# Patient Record
Sex: Female | Born: 1935 | Race: White | Hispanic: No | Smoking: Never smoker
Health system: Southern US, Community
[De-identification: ages and names within clinical notes are randomized; demographics above are authoritative.]

## PROBLEM LIST (undated history)

## (undated) DIAGNOSIS — D122 Benign neoplasm of ascending colon: Secondary | ICD-10-CM

## (undated) DIAGNOSIS — H269 Unspecified cataract: Secondary | ICD-10-CM

## (undated) DIAGNOSIS — I10 Essential (primary) hypertension: Secondary | ICD-10-CM

## (undated) DIAGNOSIS — C9001 Multiple myeloma in remission: Secondary | ICD-10-CM

## (undated) DIAGNOSIS — E785 Hyperlipidemia, unspecified: Secondary | ICD-10-CM

## (undated) DIAGNOSIS — C4490 Unspecified malignant neoplasm of skin, unspecified: Secondary | ICD-10-CM

## (undated) DIAGNOSIS — L570 Actinic keratosis: Secondary | ICD-10-CM

## (undated) DIAGNOSIS — B029 Zoster without complications: Secondary | ICD-10-CM

## (undated) DIAGNOSIS — K219 Gastro-esophageal reflux disease without esophagitis: Secondary | ICD-10-CM

## (undated) DIAGNOSIS — E039 Hypothyroidism, unspecified: Secondary | ICD-10-CM

## (undated) DIAGNOSIS — M1990 Unspecified osteoarthritis, unspecified site: Secondary | ICD-10-CM

## (undated) DIAGNOSIS — C9000 Multiple myeloma not having achieved remission: Secondary | ICD-10-CM

## (undated) DIAGNOSIS — M810 Age-related osteoporosis without current pathological fracture: Secondary | ICD-10-CM

## (undated) HISTORY — DX: Multiple myeloma not having achieved remission: C90.00

## (undated) HISTORY — DX: Actinic keratosis: L57.0

## (undated) HISTORY — DX: Benign neoplasm of ascending colon: D12.2

## (undated) HISTORY — DX: Multiple myeloma in remission: C90.01

## (undated) HISTORY — DX: Unspecified osteoarthritis, unspecified site: M19.90

## (undated) HISTORY — DX: Unspecified cataract: H26.9

## (undated) HISTORY — DX: Gastro-esophageal reflux disease without esophagitis: K21.9

## (undated) HISTORY — PX: BREAST EXCISIONAL BIOPSY: SUR124

## (undated) HISTORY — DX: Hyperlipidemia, unspecified: E78.5

## (undated) HISTORY — DX: Essential (primary) hypertension: I10

## (undated) HISTORY — DX: Zoster without complications: B02.9

## (undated) HISTORY — DX: Hypothyroidism, unspecified: E03.9

## (undated) HISTORY — PX: CATARACT EXTRACTION W/ INTRAOCULAR LENS IMPLANT: SHX1309

## (undated) HISTORY — DX: Unspecified malignant neoplasm of skin, unspecified: C44.90

## (undated) HISTORY — DX: Age-related osteoporosis without current pathological fracture: M81.0

---

## 1978-06-11 HISTORY — PX: LAPAROSCOPIC HYSTERECTOMY: SHX1926

## 2004-07-06 ENCOUNTER — Ambulatory Visit: Payer: Self-pay | Admitting: Internal Medicine

## 2005-04-19 ENCOUNTER — Emergency Department: Payer: Self-pay | Admitting: Emergency Medicine

## 2005-04-19 ENCOUNTER — Other Ambulatory Visit: Payer: Self-pay

## 2005-07-25 ENCOUNTER — Ambulatory Visit: Payer: Self-pay | Admitting: Internal Medicine

## 2006-07-26 ENCOUNTER — Ambulatory Visit: Payer: Self-pay | Admitting: Internal Medicine

## 2007-08-13 ENCOUNTER — Ambulatory Visit: Payer: Self-pay | Admitting: Internal Medicine

## 2008-04-16 ENCOUNTER — Encounter: Payer: Self-pay | Admitting: Internal Medicine

## 2008-08-24 ENCOUNTER — Ambulatory Visit: Payer: Self-pay | Admitting: Gastroenterology

## 2008-09-03 ENCOUNTER — Ambulatory Visit: Payer: Self-pay | Admitting: Internal Medicine

## 2009-09-21 ENCOUNTER — Ambulatory Visit: Payer: Self-pay | Admitting: Internal Medicine

## 2010-06-01 ENCOUNTER — Ambulatory Visit: Payer: Self-pay

## 2010-10-04 ENCOUNTER — Ambulatory Visit: Payer: Self-pay | Admitting: Internal Medicine

## 2011-03-05 ENCOUNTER — Ambulatory Visit: Payer: Self-pay | Admitting: Orthopedic Surgery

## 2011-10-10 ENCOUNTER — Ambulatory Visit: Payer: Self-pay | Admitting: Internal Medicine

## 2012-10-29 ENCOUNTER — Ambulatory Visit: Payer: Self-pay | Admitting: Internal Medicine

## 2013-11-11 ENCOUNTER — Ambulatory Visit: Payer: Self-pay | Admitting: Internal Medicine

## 2014-02-04 DIAGNOSIS — C4492 Squamous cell carcinoma of skin, unspecified: Secondary | ICD-10-CM

## 2014-02-04 HISTORY — DX: Squamous cell carcinoma of skin, unspecified: C44.92

## 2014-03-10 ENCOUNTER — Ambulatory Visit: Payer: Self-pay | Admitting: Internal Medicine

## 2014-12-14 ENCOUNTER — Other Ambulatory Visit: Payer: Self-pay | Admitting: Nurse Practitioner

## 2014-12-14 DIAGNOSIS — R319 Hematuria, unspecified: Secondary | ICD-10-CM

## 2014-12-16 ENCOUNTER — Ambulatory Visit
Admission: RE | Admit: 2014-12-16 | Discharge: 2014-12-16 | Disposition: A | Discharge status: Home / Self Care | Payer: Medicare Other | Source: Ambulatory Visit | Attending: Nurse Practitioner | Admitting: Nurse Practitioner

## 2014-12-16 DIAGNOSIS — R948 Abnormal results of function studies of other organs and systems: Secondary | ICD-10-CM | POA: Insufficient documentation

## 2014-12-16 DIAGNOSIS — M479 Spondylosis, unspecified: Secondary | ICD-10-CM | POA: Insufficient documentation

## 2014-12-16 DIAGNOSIS — M4806 Spinal stenosis, lumbar region: Secondary | ICD-10-CM | POA: Diagnosis not present

## 2014-12-16 DIAGNOSIS — R319 Hematuria, unspecified: Secondary | ICD-10-CM

## 2014-12-16 DIAGNOSIS — I700 Atherosclerosis of aorta: Secondary | ICD-10-CM | POA: Insufficient documentation

## 2014-12-16 DIAGNOSIS — I2510 Atherosclerotic heart disease of native coronary artery without angina pectoris: Secondary | ICD-10-CM | POA: Insufficient documentation

## 2014-12-16 DIAGNOSIS — R932 Abnormal findings on diagnostic imaging of liver and biliary tract: Secondary | ICD-10-CM | POA: Insufficient documentation

## 2014-12-16 DIAGNOSIS — M545 Low back pain: Secondary | ICD-10-CM | POA: Insufficient documentation

## 2014-12-16 DIAGNOSIS — M4856XA Collapsed vertebra, not elsewhere classified, lumbar region, initial encounter for fracture: Secondary | ICD-10-CM | POA: Diagnosis not present

## 2014-12-16 DIAGNOSIS — M5136 Other intervertebral disc degeneration, lumbar region: Secondary | ICD-10-CM | POA: Diagnosis not present

## 2014-12-16 DIAGNOSIS — R312 Other microscopic hematuria: Secondary | ICD-10-CM | POA: Diagnosis not present

## 2014-12-16 MED ORDER — IOHEXOL 300 MG/ML  SOLN
125.0000 mL | Freq: Once | INTRAMUSCULAR | Status: AC | PRN
  Administered 2014-12-16: 125 mL via INTRAVENOUS

## 2014-12-28 ENCOUNTER — Other Ambulatory Visit: Payer: Self-pay | Admitting: Internal Medicine

## 2014-12-28 DIAGNOSIS — R079 Chest pain, unspecified: Secondary | ICD-10-CM

## 2014-12-28 DIAGNOSIS — R0602 Shortness of breath: Secondary | ICD-10-CM

## 2014-12-31 ENCOUNTER — Ambulatory Visit
Admission: RE | Admit: 2014-12-31 | Discharge: 2014-12-31 | Disposition: A | Discharge status: Home / Self Care | Payer: Medicare Other | Source: Ambulatory Visit | Attending: Internal Medicine | Admitting: Internal Medicine

## 2014-12-31 DIAGNOSIS — R0602 Shortness of breath: Secondary | ICD-10-CM | POA: Insufficient documentation

## 2014-12-31 DIAGNOSIS — R079 Chest pain, unspecified: Secondary | ICD-10-CM

## 2014-12-31 MED ORDER — TECHNETIUM TC 99M SESTAMIBI - CARDIOLITE
10.0000 | Freq: Once | INTRAVENOUS | Status: AC | PRN
  Administered 2014-12-31: 10:00:00 13.79 via INTRAVENOUS

## 2014-12-31 MED ORDER — REGADENOSON 0.4 MG/5ML IV SOLN
0.4000 mg | Freq: Once | INTRAVENOUS | Status: AC
  Administered 2014-12-31: 0.4 mg via INTRAVENOUS

## 2014-12-31 MED ORDER — TECHNETIUM TC 99M SESTAMIBI - CARDIOLITE
30.0000 | Freq: Once | INTRAVENOUS | Status: AC | PRN
  Administered 2014-12-31: 32.59 via INTRAVENOUS

## 2015-01-04 ENCOUNTER — Encounter: Payer: Self-pay | Admitting: Urology

## 2015-01-04 ENCOUNTER — Ambulatory Visit (INDEPENDENT_AMBULATORY_CARE_PROVIDER_SITE_OTHER): Payer: Medicare Other | Admitting: Urology

## 2015-01-04 VITALS — BP 133/67 | HR 97 | Ht 62.0 in | Wt 131.5 lb

## 2015-01-04 DIAGNOSIS — R312 Other microscopic hematuria: Secondary | ICD-10-CM

## 2015-01-04 DIAGNOSIS — R300 Dysuria: Secondary | ICD-10-CM | POA: Diagnosis not present

## 2015-01-04 DIAGNOSIS — R3129 Other microscopic hematuria: Secondary | ICD-10-CM

## 2015-01-04 DIAGNOSIS — R350 Frequency of micturition: Secondary | ICD-10-CM | POA: Diagnosis not present

## 2015-01-04 LAB — URINALYSIS, COMPLETE
BILIRUBIN UA: NEGATIVE
GLUCOSE, UA: NEGATIVE
Ketones, UA: NEGATIVE
LEUKOCYTES UA: NEGATIVE
Nitrite, UA: NEGATIVE
PH UA: 5.5 (ref 5.0–7.5)
Specific Gravity, UA: 1.025 (ref 1.005–1.030)
Urobilinogen, Ur: 0.2 mg/dL (ref 0.2–1.0)

## 2015-01-04 LAB — MICROSCOPIC EXAMINATION: BACTERIA UA: NONE SEEN

## 2015-01-04 LAB — BLADDER SCAN AMB NON-IMAGING

## 2015-01-04 NOTE — Progress Notes (Signed)
01/04/2015 4:41 PM   Rhonda [name] September 30, 1933 [id number]  Referring provider: No referring provider defined for this encounter.  Chief Complaint  Patient presents with  . Hematuria    New Patient    HPI: 81 year old female referred for evaluation of microscopic hematuria.  Microscopic blood was identified and several independent UAs in the absence of infection. She initially started having UAs tested after being treated for a "severe" UTI in October and May 2016 per patient report.  Despite treatment for infections, continues to have persistent microscopic hematuria, dysuria, and urinary frequency.  She had associated nausea and chills at times.  She also has occasional suprapubic [name] and discomfort.  She reports that she has not been felling well for months.   Never smoker.  She denies a history of flank [name] or gross hematuria. No history of kidney stones.    She underwent CT urogram as part of the workup for her microscopic hematuria which showed no evidence of significant GU pathology. She was noted to have incidental rectal wall thickening and has an appointment with GI, Dr. Allen Norris, for further evaluation.    PMH: Past Medical History  Diagnosis Date  . GERD (gastroesophageal reflux disease)   . Arthritis   . Hyperlipemia   . Hypertension     Surgical History: Past Surgical History  Procedure Laterality Date  . Laparoscopic hysterectomy  1980    Home Medications:    Medication List       This list is accurate as of: 01/04/15 11:59 PM.  Always use your most recent med list.               bisoprolol-hydrochlorothiazide 2.5-6.25 MG per tablet  Commonly known as:  ZIAC     ibandronate 150 MG tablet  Commonly known as:  BONIVA  Take 150 mg by mouth every 30 (thirty) days. Take in the morning with a full glass of water, on an empty stomach, and do not take anything else by mouth or lie down for the next 30 min.     levothyroxine 50 MCG tablet  Commonly  known as:  SYNTHROID, LEVOTHROID     PYRIDIUM PO  Take by mouth.     RESTASIS 0.05 % ophthalmic emulsion  Generic drug:  cycloSPORINE     VYTORIN 10-40 MG per tablet  Generic drug:  ezetimibe-simvastatin        Allergies: No Known Allergies  Family History: Family History  Problem Relation Age of Onset  . Kidney cancer Neg Hx   . Prostate cancer Neg Hx   . Bladder Cancer Neg Hx     Social History:  reports that she has never smoked. She does not have any smokeless tobacco history on file. She reports that she does not drink alcohol or use illicit drugs.  ROS: UROLOGY Frequent Urination?: Yes Hard to postpone urination?: No Burning/[name] with urination?: Yes Get up at night to urinate?: No Leakage of urine?: Yes Urine stream starts and stops?: No Trouble starting stream?: Yes Do you have to strain to urinate?: Yes Blood in urine?: Yes Urinary tract infection?: Yes Sexually transmitted disease?: No Injury to kidneys or bladder?: No Painful intercourse?: No Weak stream?: No Currently pregnant?: No Vaginal bleeding?: No Last menstrual period?: hysterectomy  Gastrointestinal Nausea?: Yes Vomiting?: No Indigestion/heartburn?: Yes Diarrhea?: No Constipation?: No  Constitutional Fever: No Night sweats?: Yes Weight loss?: Yes Fatigue?: Yes  Skin Skin rash/lesions?: No Itching?: No  Eyes Blurred vision?: No Double vision?: No  Ears/Nose/Throat Sore throat?: No Sinus problems?: Yes  Hematologic/Lymphatic Swollen glands?: No Easy bruising?: Yes  Cardiovascular Leg swelling?: Yes Chest [name]?: No  Respiratory Cough?: Yes Shortness of breath?: Yes  Endocrine Excessive thirst?: Yes  Musculoskeletal Back [name]?: Yes Joint [name]?: Yes  Neurological Headaches?: No Dizziness?: No  Psychologic Depression?: No Anxiety?: No  Physical Exam: BP 133/67 mmHg  Pulse 97  Ht 5\' 2"  (1.575 m)  Wt 131 lb 8 oz (59.648 kg)  BMI 24.05 kg/m2    Constitutional:  Alert and oriented, No acute distress. HEENT: [place] AT, moist mucus membranes.  Trachea midline, no masses. Cardiovascular: No clubbing, cyanosis, or edema. Respiratory: Normal respiratory effort, no increased work of breathing. GI: Abdomen is soft, nontender, nondistended, no abdominal masses GU: No CVA tenderness.  Skin: No rashes, bruises or suspicious lesions. Lymph: No cervical or inguinal adenopathy. Neurologic: Grossly intact, no focal deficits, moving all 4 extremities. Psychiatric: Normal mood and affect.  Laboratory Data:   Urinalysis Results for orders placed or performed in visit on 01/04/15  Microscopic Examination  Result Value Ref Range   WBC, UA 0-5 0 -  5 /hpf   RBC, UA 0-2 0 -  2 /hpf   Epithelial Cells (non renal) 0-10 0 - 10 /hpf   Bacteria, UA None seen None seen/Few  Urinalysis, Complete  Result Value Ref Range   Specific Gravity, UA 1.025 1.005 - 1.030   pH, UA 5.5 5.0 - 7.5   Color, UA Yellow Yellow   Appearance Ur Clear Clear   Leukocytes, UA Negative Negative   Protein, UA 2+ (A) Negative/Trace   Glucose, UA Negative Negative   Ketones, UA Negative Negative   RBC, UA 2+ (A) Negative   Bilirubin, UA Negative Negative   Urobilinogen, Ur 0.2 0.2 - 1.0 mg/dL   Nitrite, UA Negative Negative   Microscopic Examination See below:   BLADDER SCAN AMB NON-IMAGING  Result Value Ref Range   Scan Result 34ml     Pertinent Imaging:    Study Result     CLINICAL DATA: Nausea and illness in May 2016 with dehydration. Microscopic hematuria at the time. Pelvic and low back [name] and soreness.  EXAM: CT ABDOMEN AND PELVIS WITHOUT AND WITH CONTRAST  TECHNIQUE: Multidetector CT imaging of the abdomen and pelvis was performed following the standard protocol before and following the bolus administration of intravenous contrast.  CONTRAST: 133mL OMNIPAQUE IOHEXOL 300 MG/ML SOLN  COMPARISON: 03/10/2014  FINDINGS: Lower chest:  Right coronary artery atherosclerotic calcification.  Hepatobiliary: Scattered hypodense lesions in the liver, stable from 03/10/2014, likely cysts. Mildly contracted gallbladder.  Pancreas: Unremarkable  Spleen: Unremarkable  Adrenals/Urinary Tract: Hypodense right mid kidney lesion 5 mm, likely a cyst. Similar left kidney lower pole lesion, image 38 series 12, 5 mm diameter, likely a cyst. No significant abnormal parenchymal enhancement in the kidneys. No stones identified. No abnormal urothelial enhancement observed. On delayed phase images, no filling defect along the urothelium is seen, with good opacification of the ureters and collecting systems.  Stomach/Bowel: Mild lower rectal wall thickening.  Vascular/Lymphatic: Aortoiliac atherosclerotic vascular disease.  Reproductive: Uterus absent. Ovaries not well seen.  Other: Stable chronic mild mesenteric stranding slightly eccentric to the left, nonspecific. There is some scattered small lymph nodes in this vicinity but no pathologically enlarged adenopathy. No appreciable change in this over the past year.  Musculoskeletal: Superior endplate compression fracture at L1, not changed from prior. Prominent degenerative endplate disease and mild grade 1 anterolisthesis at L4-5, similar  to prior, with mild to moderate right foraminal stenosis at L4-5 resulting.  IMPRESSION: 1. No cause for hematuria identified. 2. Mild lower rectal wall thickening, nonspecific. Although most likely from peristaltic activity, inflammation or neoplasia is not specifically excluded. 3. Stable hepatic hypodense lesions, likely cysts. 4. Other imaging findings of potential clinical significance: Right coronary artery atherosclerosis; several tiny renal cysts; Aortoiliac atherosclerotic vascular disease. ; chronic nonspecific mild left eccentric mesenteric stranding ; chronic superior endplate compression at L1; mild to moderate right  foraminal stenosis at L4-5 due to spondylosis and degenerative disc disease.   Electronically Signed  By: Van Clines M.D.  On: 12/16/2014 17:06      Assessment & Plan:  81 year old female with microscopic hematuria, and other complaints including dysuria, lower abdominal [name], and urinary frequency despite the absence of infection. CT urogram was negative. Per AUA guidelines, recommend cystoscopy to complete micro-heme workup. Plan to perform pelvic exam at the same time to evaluate possible atrophic vaginitis contributing to dysuria and symptoms.  1. Microscopic hematuria Plan for cystoscopy - Urinalysis, Complete  2. Urinary frequency No evidence of incomplete bladder emptying today. Discussed behavioral modification. - BLADDER SCAN AMB NON-IMAGING  3. Dysuria Will perform pelvic exam at the time of cystoscopy.   Return in about 2 weeks (around 01/18/2015) for cystoscopy.  Hollice Espy, MD  [facility] [address] [place],  [address] [phone]

## 2015-01-04 NOTE — Patient Instructions (Signed)
Cystoscopy       Cystoscopy is a procedure that is used to help your caregiver diagnose and sometimes treat conditions that affect your lower urinary tract. Your lower urinary tract includes your bladder and the tube through which urine passes from your bladder out of your body (urethra). Cystoscopy is performed with a thin, tube-shaped instrument (cystoscope). The cystoscope has lenses and a light at the end so that your caregiver can see inside your bladder. The cystoscope is inserted at the entrance of your urethra. Your caregiver guides it through your urethra and into your bladder. There are two main types of cystoscopy:   Flexible cystoscopy (with a flexible cystoscope).   Rigid cystoscopy (with a rigid cystoscope).  Cystoscopy may be recommended for many conditions, including:   Urinary tract infections.   Blood in your urine (hematuria).   Loss of bladder control (urinary incontinence) or overactive bladder.   Unusual cells found in a urine sample.   Urinary blockage.   Painful urination.  Cystoscopy may also be done to remove a sample of your tissue to be checked under a microscope (biopsy). It may also be done to remove or destroy bladder stones.   LET YOUR CAREGIVER KNOW ABOUT:   Allergies to food or medicine.   Medicines taken, including vitamins, herbs, eyedrops, over-the-counter medicines, and creams.   Use of steroids (by mouth or creams).   Previous problems with anesthetics or numbing medicines.   History of bleeding problems or blood clots.   Previous surgery.   Other health problems, including diabetes and kidney problems.   Possibility of pregnancy, if this applies.  PROCEDURE   The area around the opening to your urethra will be cleaned. A medicine to numb your urethra (local anesthetic) is used. If a tissue sample or stone is removed during the procedure, you may be given a medicine to make you sleep (general anesthetic).   Your caregiver will gently insert the tip of the cystoscope into your  urethra. The cystoscope will be slowly glided through your urethra and into your bladder. Sterile fluid will flow through the cystoscope and into your bladder. The fluid will expand and stretch your bladder. This gives your caregiver a better view of your bladder walls. The procedure lasts about 15-20 minutes.   AFTER THE PROCEDURE   If a local anesthetic is used, you will be allowed to go home as soon as you are ready. If a general anesthetic is used, you will be taken to a recovery area until you are stable. You may have temporary bleeding and burning on urination.   Document Released: 05/25/2000 Document Revised: 02/20/2012 Document Reviewed: 11/19/2011   Exit[employer]® Patient Information ©2015 [employer]. This information is not intended to replace advice given to you by your health care provider. Make sure you discuss any questions you have with your health care provider.

## 2015-01-31 ENCOUNTER — Encounter: Payer: Self-pay | Admitting: Urology

## 2015-01-31 ENCOUNTER — Ambulatory Visit (INDEPENDENT_AMBULATORY_CARE_PROVIDER_SITE_OTHER): Payer: Medicare Other | Admitting: Urology

## 2015-01-31 VITALS — BP 149/81 | HR 77 | Ht 62.0 in | Wt 129.9 lb

## 2015-01-31 DIAGNOSIS — M810 Age-related osteoporosis without current pathological fracture: Secondary | ICD-10-CM | POA: Insufficient documentation

## 2015-01-31 DIAGNOSIS — R312 Other microscopic hematuria: Secondary | ICD-10-CM

## 2015-01-31 DIAGNOSIS — E785 Hyperlipidemia, unspecified: Secondary | ICD-10-CM | POA: Insufficient documentation

## 2015-01-31 DIAGNOSIS — K219 Gastro-esophageal reflux disease without esophagitis: Secondary | ICD-10-CM | POA: Insufficient documentation

## 2015-01-31 DIAGNOSIS — I10 Essential (primary) hypertension: Secondary | ICD-10-CM | POA: Insufficient documentation

## 2015-01-31 DIAGNOSIS — M1990 Unspecified osteoarthritis, unspecified site: Secondary | ICD-10-CM | POA: Insufficient documentation

## 2015-01-31 DIAGNOSIS — N952 Postmenopausal atrophic vaginitis: Secondary | ICD-10-CM | POA: Diagnosis not present

## 2015-01-31 DIAGNOSIS — E039 Hypothyroidism, unspecified: Secondary | ICD-10-CM | POA: Insufficient documentation

## 2015-01-31 DIAGNOSIS — R3129 Other microscopic hematuria: Secondary | ICD-10-CM

## 2015-01-31 HISTORY — DX: Essential (primary) hypertension: I10

## 2015-01-31 LAB — URINALYSIS, COMPLETE
BILIRUBIN UA: NEGATIVE
GLUCOSE, UA: NEGATIVE
KETONES UA: NEGATIVE
Leukocytes, UA: NEGATIVE
NITRITE UA: NEGATIVE
SPEC GRAV UA: 1.025 (ref 1.005–1.030)
UUROB: 0.2 mg/dL (ref 0.2–1.0)
pH, UA: 7 (ref 5.0–7.5)

## 2015-01-31 LAB — MICROSCOPIC EXAMINATION: Bacteria, UA: NONE SEEN

## 2015-01-31 MED ORDER — CIPROFLOXACIN HCL 500 MG PO TABS
500.0000 mg | ORAL_TABLET | Freq: Once | ORAL | Status: AC
  Administered 2015-01-31: 500 mg via ORAL

## 2015-01-31 MED ORDER — LIDOCAINE HCL 2 % EX GEL
1.0000 "application " | Freq: Once | CUTANEOUS | Status: AC
  Administered 2015-01-31: 1 via URETHRAL

## 2015-01-31 NOTE — Progress Notes (Signed)
Follow-up for microscopic hematuria and dysuria. Despite treatment for infections, continues to have persistent microscopic hematuria, dysuria, and urinary frequency. She had associated nausea and chills at times. She also has occasional suprapubic pain and discomfort. She had a normal post void residual. No smoking risk. No flank pain or gross hematuria. Of note, patient has been on Premarin cream in the past with her PCP.  Today, she follows up for the above. She underwentn CT urogram as part of the workup for her microscopic hematuria which showed no evidence of significant GU pathology. She was noted to have incidental rectal wall thickening and has an appointment with GI, Dr. Allen Norris, for further evaluation. I reviewed all the images today.  Physical exam: Chaperone-Kelita Abdomen was soft and nontender without mass GU: Significant vaginal atrophy and erythema of the vulva but otherwise no mass or lesion. The bladder and urethra were palpably normal.  Cystoscopy: Flexible cystoscope was passed per urethra. The urethra appeared normal. The trigone and ureteral orifices were in their normal orthotopic position with clear efflux. The bladder mucosa was unremarkable. There were no tumors. There were no stones or foreign bodies in the bladder. There was no trabeculation.   A/P -  Microscopic hematuria-benign evaluation Atrophic vaginitis-we discussed the nature risks benefits and alternatives to topical transvaginal estrogen with overall lower systemic absorption but did discuss risk of breast, uterine cancer and DVT. She'll consider and discuss again with Dr. Humphrey Rolls.

## 2015-02-01 ENCOUNTER — Other Ambulatory Visit: Payer: Self-pay

## 2015-02-01 ENCOUNTER — Ambulatory Visit (INDEPENDENT_AMBULATORY_CARE_PROVIDER_SITE_OTHER): Payer: Medicare Other | Admitting: Gastroenterology

## 2015-02-01 ENCOUNTER — Encounter: Payer: Self-pay | Admitting: Gastroenterology

## 2015-02-01 VITALS — BP 122/68 | HR 90 | Temp 98.6°F | Ht 62.0 in | Wt 129.8 lb

## 2015-02-01 DIAGNOSIS — R1032 Left lower quadrant pain: Secondary | ICD-10-CM | POA: Diagnosis not present

## 2015-02-01 DIAGNOSIS — R933 Abnormal findings on diagnostic imaging of other parts of digestive tract: Secondary | ICD-10-CM | POA: Diagnosis not present

## 2015-02-01 NOTE — Progress Notes (Signed)
Gastroenterology Consultation  Referring Provider:     Lavera Guise, MD Primary Care Physician:  Lavera Guise, MD Primary Gastroenterologist:  Dr. Allen Norris     Reason for Consultation:     Abnormal CT scan        HPI:   Rhonda [name] is a 81 y.o. y/o female referred for consultation & management of abnormal CT scan by Dr. Humphrey Rolls, Timoteo Gaul, MD.  This patient comes today with a history of hematuria and she was sent for CT scan that showed her to have thickening of the rectum which was reported as mild. The patient's workup for her kidney disease and bladder was negative. She reports that I did her colonoscopy approximate 6-1/2 years ago. There is no report of any unexplained weight loss. The patient also reports that she has had no change in bowel habits. She has been having some left lower quadrant pain and there showed some mesenteric fat stranding on the left on her CT scan. The pain is reported to be a spasm-like pain that will double her over in pain and then resolve. There is no report of any black stools or bloody stools. She also denies any nausea or vomiting associated with this pain.  Past Medical History  Diagnosis Date  . GERD (gastroesophageal reflux disease)   . Arthritis   . Hyperlipemia   . Hypertension   . Osteoporosis   . Hypothyroidism     Past Surgical History  Procedure Laterality Date  . Laparoscopic hysterectomy  1980    Prior to Admission medications   Medication Sig Start Date End Date Taking? Authorizing Provider  EPIPEN 2-PAK 0.3 MG/0.3ML SOAJ injection  01/25/15  Yes Historical Provider, MD  ibandronate (BONIVA) 150 MG tablet Take 150 mg by mouth every 30 (thirty) days. Take in the morning with a full glass of water, on an empty stomach, and do not take anything else by mouth or lie down for the next 30 min.   Yes Historical Provider, MD  levothyroxine (SYNTHROID, LEVOTHROID) 50 MCG tablet  12/24/14  Yes Historical Provider, MD  RESTASIS 0.05 % ophthalmic  emulsion  10/04/14  Yes Historical Provider, MD  VYTORIN 10-40 MG per tablet  12/28/14  Yes Historical Provider, MD  bisoprolol-hydrochlorothiazide [facility]) 2.5-6.25 MG per tablet  12/28/14   Historical Provider, MD  Phenazopyridine HCl (PYRIDIUM PO) Take by mouth.    Historical Provider, MD    Family History  Problem Relation Age of Onset  . Kidney cancer Neg Hx   . Prostate cancer Neg Hx   . Bladder Cancer Neg Hx   . Heart disease Mother   . Stroke Father   . Heart disease Brother      Social History  Substance Use Topics  . Smoking status: Never Smoker   . Smokeless tobacco: None  . Alcohol Use: No    Allergies as of 02/01/2015  . (No Known Allergies)    Review of Systems:    All systems reviewed and negative except where noted in HPI.   Physical Exam:  BP 122/68 mmHg  Pulse 90  Temp(Src) 98.6 F (37 C) (Oral)  Ht 5\' 2"  (1.575 m)  Wt 129 lb 12.8 oz (58.877 kg)  BMI 23.73 kg/m2 No LMP recorded. Patient has had a hysterectomy. Psych:  Alert and cooperative. Normal mood and affect. General:   Alert,  Well-developed, well-nourished, pleasant and cooperative in NAD Head:  Normocephalic and atraumatic. Eyes:  Sclera clear, no icterus.  Conjunctiva pink. Ears:  Normal auditory acuity. Nose:  No deformity, discharge, or lesions. Mouth:  No deformity or lesions,oropharynx pink & moist. Neck:  Supple; no masses or thyromegaly. Lungs:  Respirations even and unlabored.  Clear throughout to auscultation.   No wheezes, crackles, or rhonchi. No acute distress. Heart:  Regular rate and rhythm; no murmurs, clicks, rubs, or gallops. Abdomen:  Normal bowel sounds.  No bruits.  Soft, with mild left lower quadrant tenderness and non-distended without masses, hepatosplenomegaly or hernias noted.  No guarding or rebound tenderness.  Negative Carnett sign.   Rectal:  Deferred.  Msk:  Symmetrical without gross deformities.  Good, equal movement & strength bilaterally. Pulses:  Normal pulses  noted. Extremities:  No clubbing or edema.  No cyanosis. Arthritic changes in her hands noted. Neurologic:  Alert and oriented x3;  grossly normal neurologically. Skin:  Intact without significant lesions or rashes.  No jaundice. Lymph Nodes:  No significant cervical adenopathy. Psych:  Alert and cooperative. Normal mood and affect.  Imaging Studies: No results found.  Assessment and Plan:   Rhonda [name] is a 81 y.o. y/o female who comes in today with a abnormal CT scan showing thickening of the rectum on a CT scan that was done for hematuria. The patient has had no change in bowel habits or any other GI symptoms except for left lower quadrant pain. The patient has been offered a sigmoidoscopy versus a colonoscopy to look at this area. The patient has elected to undergo colonoscopy.I have discussed risks & benefits which include, but are not limited to, bleeding, infection, perforation & drug reaction.  The patient agrees with this plan & written consent will be obtained.      Note: This dictation was prepared with Dragon dictation along with smaller phrase technology. Any transcriptional errors that result from this process are unintentional.

## 2015-02-08 ENCOUNTER — Other Ambulatory Visit: Payer: Self-pay | Admitting: Physician Assistant

## 2015-02-08 DIAGNOSIS — Z1231 Encounter for screening mammogram for malignant neoplasm of breast: Secondary | ICD-10-CM

## 2015-02-08 DIAGNOSIS — I6523 Occlusion and stenosis of bilateral carotid arteries: Secondary | ICD-10-CM

## 2015-02-09 ENCOUNTER — Ambulatory Visit
Admission: RE | Admit: 2015-02-09 | Discharge: 2015-02-09 | Disposition: A | Discharge status: Home / Self Care | Payer: Medicare Other | Source: Ambulatory Visit | Attending: Physician Assistant | Admitting: Physician Assistant

## 2015-02-09 DIAGNOSIS — I6523 Occlusion and stenosis of bilateral carotid arteries: Secondary | ICD-10-CM | POA: Insufficient documentation

## 2015-02-09 LAB — NM MYOCAR MULTI W/SPECT W/WALL MOTION / EF
CHL CUP NUCLEAR SSS: 0
LVDIAVOL: 49 mL
LVSYSVOL: 15 mL
SDS: 0
SRS: 0
TID: 0.9

## 2015-02-09 MED ORDER — IOHEXOL 350 MG/ML SOLN
75.0000 mL | Freq: Once | INTRAVENOUS | Status: AC | PRN
  Administered 2015-02-09: 75 mL via INTRAVENOUS

## 2015-02-17 NOTE — Discharge Instructions (Signed)

## 2015-02-18 ENCOUNTER — Ambulatory Visit
Admission: RE | Admit: 2015-02-18 | Discharge: 2015-02-18 | Disposition: A | Discharge status: Home / Self Care | Payer: Medicare Other | Source: Ambulatory Visit | Attending: Physician Assistant | Admitting: Physician Assistant

## 2015-02-18 ENCOUNTER — Other Ambulatory Visit: Payer: Self-pay | Admitting: Physician Assistant

## 2015-02-18 DIAGNOSIS — Z1231 Encounter for screening mammogram for malignant neoplasm of breast: Secondary | ICD-10-CM | POA: Diagnosis present

## 2015-02-21 ENCOUNTER — Ambulatory Visit: Payer: Medicare Other | Admitting: Anesthesiology

## 2015-02-21 ENCOUNTER — Other Ambulatory Visit: Payer: Self-pay | Admitting: Gastroenterology

## 2015-02-21 ENCOUNTER — Ambulatory Visit
Admission: RE | Admit: 2015-02-21 | Discharge: 2015-02-21 | Disposition: A | Discharge status: Home / Self Care | Payer: Medicare Other | Source: Ambulatory Visit | Attending: Gastroenterology | Admitting: Gastroenterology

## 2015-02-21 ENCOUNTER — Encounter
Admission: RE | Disposition: A | Discharge status: Home / Self Care | Payer: Self-pay | Source: Ambulatory Visit | Attending: Gastroenterology

## 2015-02-21 DIAGNOSIS — R198 Other specified symptoms and signs involving the digestive system and abdomen: Secondary | ICD-10-CM | POA: Insufficient documentation

## 2015-02-21 DIAGNOSIS — M19042 Primary osteoarthritis, left hand: Secondary | ICD-10-CM | POA: Diagnosis not present

## 2015-02-21 DIAGNOSIS — M19041 Primary osteoarthritis, right hand: Secondary | ICD-10-CM | POA: Insufficient documentation

## 2015-02-21 DIAGNOSIS — D122 Benign neoplasm of ascending colon: Secondary | ICD-10-CM | POA: Diagnosis not present

## 2015-02-21 DIAGNOSIS — M810 Age-related osteoporosis without current pathological fracture: Secondary | ICD-10-CM | POA: Diagnosis not present

## 2015-02-21 DIAGNOSIS — E785 Hyperlipidemia, unspecified: Secondary | ICD-10-CM | POA: Diagnosis not present

## 2015-02-21 DIAGNOSIS — Z79899 Other long term (current) drug therapy: Secondary | ICD-10-CM | POA: Insufficient documentation

## 2015-02-21 DIAGNOSIS — R933 Abnormal findings on diagnostic imaging of other parts of digestive tract: Secondary | ICD-10-CM

## 2015-02-21 DIAGNOSIS — K219 Gastro-esophageal reflux disease without esophagitis: Secondary | ICD-10-CM | POA: Diagnosis not present

## 2015-02-21 DIAGNOSIS — E039 Hypothyroidism, unspecified: Secondary | ICD-10-CM | POA: Diagnosis not present

## 2015-02-21 HISTORY — PX: COLONOSCOPY WITH PROPOFOL: SHX5780

## 2015-02-21 HISTORY — PX: POLYPECTOMY: SHX5525

## 2015-02-21 SURGERY — COLONOSCOPY WITH PROPOFOL
Anesthesia: Monitor Anesthesia Care | Wound class: Contaminated

## 2015-02-21 MED ORDER — STERILE WATER FOR IRRIGATION IR SOLN
Status: DC | PRN
  Administered 2015-02-21: 10:00:00

## 2015-02-21 MED ORDER — LACTATED RINGERS IV SOLN
INTRAVENOUS | Status: DC
  Administered 2015-02-21: 09:00:00 via INTRAVENOUS

## 2015-02-21 MED ORDER — PROPOFOL 10 MG/ML IV BOLUS
INTRAVENOUS | Status: DC | PRN
  Administered 2015-02-21 (×2): 50 mg via INTRAVENOUS
  Administered 2015-02-21 (×2): 30 mg via INTRAVENOUS

## 2015-02-21 MED ORDER — SODIUM CHLORIDE 0.9 % IV SOLN: INTRAVENOUS | Status: DC

## 2015-02-21 MED ORDER — LIDOCAINE HCL (CARDIAC) 20 MG/ML IV SOLN
INTRAVENOUS | Status: DC | PRN
  Administered 2015-02-21: 40 mg via INTRAVENOUS

## 2015-02-21 SURGICAL SUPPLY — 6 items
CANISTER SUCT 1200ML W/VALVE (MISCELLANEOUS) ×4 IMPLANT
FORCEPS BIOP RAD 4 LRG CAP 4 (CUTTING FORCEPS) ×4 IMPLANT
GOWN CVR UNV OPN BCK APRN NK (MISCELLANEOUS) ×4 IMPLANT
GOWN ISOL THUMB LOOP REG UNIV (MISCELLANEOUS) ×4
KIT ENDO PROCEDURE OLY (KITS) ×4 IMPLANT
WATER STERILE IRR 500ML POUR (IV SOLUTION) ×4 IMPLANT

## 2015-02-21 NOTE — Op Note (Signed)
[facility] Patient Name: Rhonda [name] Procedure Date: 02/21/2015 9:43 AM MRN: [id number] Account #: [id number] Date of Birth: [birth year] Admit Type: Outpatient Age: 81 Room: [facility] OR ROOM 01 Gender: Female Note Status: Finalized Procedure:         Colonoscopy Indications:       Abnormal CT of the GI tract Providers:         Lucilla Lame, MD Referring MD:      Lavera Guise, MD (Referring MD) Medicines:         Propofol per Anesthesia Complications:     No immediate complications. Procedure:         Pre-Anesthesia Assessment:                    - Prior to the procedure, a History and Physical was                     performed, and patient medications and allergies were                     reviewed. The patient's tolerance of previous anesthesia                     was also reviewed. The risks and benefits of the procedure                     and the sedation options and risks were discussed with the                     patient. All questions were answered, and informed consent                     was obtained. Prior Anticoagulants: The patient has taken                     no previous anticoagulant or antiplatelet agents. ASA                     Grade Assessment: II - A patient with mild systemic                     disease. After reviewing the risks and benefits, the                     patient was deemed in satisfactory condition to undergo                     the procedure.                    After obtaining informed consent, the colonoscope was                     passed under direct vision. Throughout the procedure, the                     patient's blood pressure, pulse, and oxygen saturations                     were monitored continuously. The Olympus CF H180AL                     colonoscope (S#: [id number]) was introduced through the anus  and advanced to the the cecum, identified by appendiceal   orifice and ileocecal valve. The colonoscopy was performed                     without difficulty. The patient tolerated the procedure                     well. The quality of the bowel preparation was excellent. Findings:      The perianal and digital rectal examinations were normal.      A 3 mm polyp was found in the ascending colon. The polyp was sessile.       The polyp was removed with a cold biopsy forceps. Resection and       retrieval were complete.      Rectum was normal. Impression:        - One 3 mm polyp in the ascending colon. Resected and                     retrieved.                    - Rectum was normal. Recommendation:    - Await pathology results. Procedure Code(s): --- Professional ---                    [phone], Colonoscopy, flexible; with biopsy, single or                     multiple Diagnosis Code(s): --- Professional ---                    R93.3, Abnormal findings on diagnostic imaging of other                     parts of digestive tract                    D12.2, Benign neoplasm of ascending colon CPT copyright 2014 American Medical Association. All rights reserved. The codes documented in this report are preliminary and upon coder review may  be revised to meet current compliance requirements. Lucilla Lame, MD 02/21/2015 10:00:58 AM This report has been signed electronically. Number of Addenda: 0 Note Initiated On: 02/21/2015 9:43 AM Scope Withdrawal Time: 0 hours 6 minutes 45 seconds  Total Procedure Duration: 0 hours 10 minutes 10 seconds       [facility]

## 2015-02-21 NOTE — Anesthesia Postprocedure Evaluation (Signed)
  Anesthesia Post-op Note  Patient: Rhonda [name]  Procedure(s) Performed: Procedure(s): COLONOSCOPY WITH PROPOFOL (N/A) POLYPECTOMY  Anesthesia type:MAC  Patient location: PACU  Post pain: Pain level controlled  Post assessment: Post-op Vital signs reviewed, Patient's Cardiovascular Status Stable, Respiratory Function Stable, Patent Airway and No signs of Nausea or vomiting  Post vital signs: Reviewed and stable  Last Vitals:  Filed Vitals:   02/21/15 1011  BP: 110/56  Pulse: 69  Temp:   Resp: 18    Level of consciousness: awake, alert  and patient cooperative  Complications: No apparent anesthesia complications

## 2015-02-21 NOTE — Anesthesia Preprocedure Evaluation (Signed)
Anesthesia Evaluation  Patient identified by MRN, date of birth, ID band Patient awake    Reviewed: Allergy & Precautions, H&P , NPO status , Patient's Chart, lab work & pertinent test results, reviewed documented beta blocker date and time   Airway Mallampati: II  TM Distance: >3 FB Neck ROM: full    Dental no notable dental hx.    Pulmonary neg pulmonary ROS,    Pulmonary exam normal breath sounds clear to auscultation       Cardiovascular Exercise Tolerance: Good hypertension,  Rhythm:regular Rate:Normal     Neuro/Psych negative neurological ROS  negative psych ROS   GI/Hepatic Neg liver ROS, GERD  ,  Endo/Other  Hypothyroidism   Renal/GU negative Renal ROS  negative genitourinary   Musculoskeletal   Abdominal   Peds  Hematology negative hematology ROS (+)   Anesthesia Other Findings   Reproductive/Obstetrics negative OB ROS                             Anesthesia Physical Anesthesia Plan  ASA: II  Anesthesia Plan: MAC   Post-op Pain Management:    Induction:   Airway Management Planned:   Additional Equipment:   Intra-op Plan:   Post-operative Plan:   Informed Consent: I have reviewed the patients History and Physical, chart, labs and discussed the procedure including the risks, benefits and alternatives for the proposed anesthesia with the patient or authorized representative who has indicated his/her understanding and acceptance.   Dental Advisory Given  Plan Discussed with: CRNA  Anesthesia Plan Comments:         Anesthesia Quick Evaluation  

## 2015-02-21 NOTE — Anesthesia Procedure Notes (Signed)
Procedure Name: MAC Performed by: Wylee Ogden Pre-anesthesia Checklist: Patient identified, Emergency Drugs available, Suction available, Timeout performed and Patient being monitored Patient Re-evaluated:Patient Re-evaluated prior to inductionOxygen Delivery Method: Nasal cannula Placement Confirmation: positive ETCO2     

## 2015-02-21 NOTE — Transfer of Care (Signed)
Immediate Anesthesia Transfer of Care Note  Patient: LELAH RENNAKER  Procedure(s) Performed: Procedure(s): COLONOSCOPY WITH PROPOFOL (N/A) POLYPECTOMY  Patient Location: PACU  Anesthesia Type: MAC  Level of Consciousness: awake, alert  and patient cooperative  Airway and Oxygen Therapy: Patient Spontanous Breathing and Patient connected to supplemental oxygen  Post-op Assessment: Post-op Vital signs reviewed, Patient's Cardiovascular Status Stable, Respiratory Function Stable, Patent Airway and No signs of Nausea or vomiting  Post-op Vital Signs: Reviewed and stable  Complications: No apparent anesthesia complications

## 2015-02-21 NOTE — H&P (Addendum)
  [facility]  [address] [place], [place] [address] Phone: [phone] Fax : [phone]  Primary Care Physician:  Lavera Guise, MD Primary Gastroenterologist:  Dr. Allen Norris  Pre-Procedure History & Physical: HPI:  Rhonda [name] is a 81 y.o. female is here for an colonoscopy.   Past Medical History  Diagnosis Date  . GERD (gastroesophageal reflux disease)   . Hyperlipemia   . Osteoporosis   . Hypothyroidism   . Arthritis     hands    Past Surgical History  Procedure Laterality Date  . Laparoscopic hysterectomy  1980  . Cataract extraction w/ intraocular lens implant    . Breast biopsy Right 15+ yrs ago    EXCISIONAL - NEG    Prior to Admission medications   Medication Sig Start Date End Date Taking? Authorizing Provider  ibandronate (BONIVA) 150 MG tablet Take 150 mg by mouth every 30 (thirty) days. Take in the morning with a full glass of water, on an empty stomach, and do not take anything else by mouth or lie down for the next 30 min.   Yes Historical Provider, MD  levothyroxine (SYNTHROID, LEVOTHROID) 50 MCG tablet  12/24/14  Yes Historical Provider, MD  RESTASIS 0.05 % ophthalmic emulsion  10/04/14  Yes Historical Provider, MD  VYTORIN 10-40 MG per tablet  12/28/14  Yes Historical Provider, MD  bisoprolol-hydrochlorothiazide [facility]) 2.5-6.25 MG per tablet  12/28/14   Historical Provider, MD  EPIPEN 2-PAK 0.3 MG/0.3ML SOAJ injection  01/25/15   Historical Provider, MD  Phenazopyridine HCl (PYRIDIUM PO) Take by mouth.    Historical Provider, MD    Allergies as of 02/01/2015  . (No Known Allergies)    Family History  Problem Relation Age of Onset  . Kidney cancer Neg Hx   . Prostate cancer Neg Hx   . Bladder Cancer Neg Hx   . Breast cancer Neg Hx   . Heart disease Mother   . Stroke Father   . Heart disease Brother     Social History   Social History  . Marital Status: Widowed    Spouse Name: N/A  . Number of Children: N/A  . Years of  Education: N/A   Occupational History  . Not on file.   Social History Main Topics  . Smoking status: Never Smoker   . Smokeless tobacco: Not on file  . Alcohol Use: No  . Drug Use: No  . Sexual Activity: Not on file   Other Topics Concern  . Not on file   Social History Narrative    Review of Systems: See HPI, otherwise negative ROS  Physical Exam: BP 124/69 mmHg  Pulse 76  Temp(Src) 98.2 F (36.8 C)  Ht 5\' 2"  (1.575 m)  Wt 127 lb (57.607 kg)  BMI 23.22 kg/m2  SpO2 100% General:   Alert,  pleasant and cooperative in NAD Head:  Normocephalic and atraumatic. Neck:  Supple; no masses or thyromegaly. Lungs:  Clear throughout to auscultation.    Heart:  Regular rate and rhythm. Abdomen:  Soft, nontender and nondistended. Normal bowel sounds, without guarding, and without rebound.   Neurologic:  Alert and  oriented x4;  grossly normal neurologically.  Impression/Plan: Rhonda [name] is here for an colonoscopy to be performed for abnormal CT  Risks, benefits, limitations, and alternatives regarding  colonoscopy have been reviewed with the patient.  Questions have been answered.  All parties agreeable.   Ollen Bowl, MD  02/21/2015, 9:36 AM

## 2015-02-22 ENCOUNTER — Encounter: Payer: Self-pay | Admitting: Gastroenterology

## 2015-02-24 ENCOUNTER — Encounter: Payer: Self-pay | Admitting: Gastroenterology

## 2015-05-31 ENCOUNTER — Encounter: Payer: Self-pay | Admitting: Internal Medicine

## 2016-01-31 ENCOUNTER — Ambulatory Visit: Payer: Medicare Other | Admitting: Urology

## 2016-02-09 ENCOUNTER — Other Ambulatory Visit: Payer: Self-pay | Admitting: Physician Assistant

## 2016-02-09 DIAGNOSIS — Z1231 Encounter for screening mammogram for malignant neoplasm of breast: Secondary | ICD-10-CM

## 2016-02-24 ENCOUNTER — Ambulatory Visit (INDEPENDENT_AMBULATORY_CARE_PROVIDER_SITE_OTHER): Payer: Medicare Other | Admitting: Urology

## 2016-02-24 ENCOUNTER — Encounter: Payer: Self-pay | Admitting: Urology

## 2016-02-24 VITALS — BP 113/64 | HR 66 | Ht 62.0 in | Wt 130.3 lb

## 2016-02-24 DIAGNOSIS — R3129 Other microscopic hematuria: Secondary | ICD-10-CM

## 2016-02-24 LAB — URINALYSIS, COMPLETE
BILIRUBIN UA: NEGATIVE
Glucose, UA: NEGATIVE
KETONES UA: NEGATIVE
LEUKOCYTES UA: NEGATIVE
Nitrite, UA: NEGATIVE
Urobilinogen, Ur: 0.2 mg/dL (ref 0.2–1.0)
pH, UA: 6 (ref 5.0–7.5)

## 2016-02-24 LAB — MICROSCOPIC EXAMINATION: Bacteria, UA: NONE SEEN

## 2016-02-24 NOTE — Progress Notes (Signed)
02/24/2016 2:29 PM   Rhonda [name] 11/16/1933 [id number]  Referring provider: Lavera Guise, MD [address] [place], [place] [address]  Chief Complaint  Patient presents with  . Hematuria    1 year follow up   . Urinary Frequency    HPI: 82 yo F with history of microscopic hematuria status post negative workup in 01/2015 including CT urogram and flexible cystoscopy. She returns today for annual follow-up.  Three weeks ago, she developed some urinary urgency there was concern that she was starting to get a urinary tract infection. She self medicated with pyriduim and increased her fluids. Her symptoms quickly resolved and she has had none since.  UA today is unremarkable. There is no evidence of infection.  In the last year, she denies being treated for urinary tract infection and has had no flank [name] or dysuria. No gross hematuria.  She has no complaints today.   PMH: Past Medical History:  Diagnosis Date  . Arthritis    hands  . GERD (gastroesophageal reflux disease)   . Hyperlipemia   . Hypothyroidism   . Osteoporosis     Surgical History: Past Surgical History:  Procedure Laterality Date  . BREAST BIOPSY Right 15+ yrs ago   EXCISIONAL - NEG  . CATARACT EXTRACTION W/ INTRAOCULAR LENS IMPLANT    . COLONOSCOPY WITH PROPOFOL N/A 02/21/2015   Procedure: COLONOSCOPY WITH PROPOFOL;  Surgeon: Lucilla Lame, MD;  Location: [place];  Service: Endoscopy;  Laterality: N/A;  . [place]  . POLYPECTOMY  02/21/2015   Procedure: POLYPECTOMY;  Surgeon: Lucilla Lame, MD;  Location: [place];  Service: Endoscopy;;    Home Medications:    Medication List       Accurate as of 02/24/16  2:29 PM. Always use your most recent med list.          bisoprolol-hydrochlorothiazide 2.5-6.25 MG tablet Commonly known as:  ZIAC   EPIPEN 2-PAK 0.3 mg/0.3 mL Soaj injection Generic drug:  EPINEPHrine   fexofenadine 30 MG tablet Commonly  known as:  ALLEGRA Take 30 mg by mouth 2 (two) times daily.   ibandronate 150 MG tablet Commonly known as:  BONIVA Take 150 mg by mouth every 30 (thirty) days. Take in the morning with a full glass of water, on an empty stomach, and do not take anything else by mouth or lie down for the next 30 min.   levothyroxine 50 MCG tablet Commonly known as:  SYNTHROID, LEVOTHROID   PRESCRIPTION MEDICATION Patient receives allergy injection once a week   PYRIDIUM PO Take by mouth.   RESTASIS 0.05 % ophthalmic emulsion Generic drug:  cycloSPORINE   VYTORIN 10-40 MG tablet Generic drug:  ezetimibe-simvastatin       Allergies: No Known Allergies  Family History: Family History  Problem Relation Age of Onset  . Heart disease Mother   . Stroke Father   . Heart disease Brother   . Kidney cancer Neg Hx   . Prostate cancer Neg Hx   . Bladder Cancer Neg Hx   . Breast cancer Neg Hx     Social History:  reports that she has never smoked. She has never used smokeless tobacco. She reports that she does not drink alcohol or use drugs.  ROS: UROLOGY Frequent Urination?: No Hard to postpone urination?: No Burning/[name] with urination?: No Get up at night to urinate?: No Leakage of urine?: No Urine stream starts and stops?: No Trouble starting stream?: No Do  you have to strain to urinate?: No Blood in urine?: No Urinary tract infection?: No Sexually transmitted disease?: No Injury to kidneys or bladder?: No Painful intercourse?: No Weak stream?: No Currently pregnant?: No Vaginal bleeding?: No Last menstrual period?: n  Gastrointestinal Nausea?: No Vomiting?: No Indigestion/heartburn?: No Diarrhea?: No Constipation?: No  Constitutional Fever: No Night sweats?: No Weight loss?: No Fatigue?: No  Skin Skin rash/lesions?: No Itching?: No  Eyes Blurred vision?: No Double vision?: No  Ears/Nose/Throat Sore throat?: No Sinus problems?:  Yes  Hematologic/Lymphatic Swollen glands?: No Easy bruising?: Yes  Cardiovascular Leg swelling?: No Chest [name]?: No  Respiratory Cough?: No Shortness of breath?: No  Endocrine Excessive thirst?: No  Musculoskeletal Back [name]?: No Joint [name]?: No  Neurological Headaches?: No Dizziness?: No  Psychologic Depression?: No Anxiety?: No  Physical Exam: BP 113/64   Pulse 66   Ht 5\' 2"  (1.575 m)   Wt 130 lb 4.8 oz (59.1 kg)   BMI 23.83 kg/m   Constitutional:  Alert and oriented, No acute distress. HEENT: [place] AT, moist mucus membranes.  Trachea midline, no masses. Cardiovascular: No clubbing, cyanosis, or edema. Respiratory: Normal respiratory effort, no increased work of breathing. Skin: No rashes, bruises or suspicious lesions. Neurologic: Grossly intact, no focal deficits, moving all 4 extremities. Psychiatric: Normal mood and affect.  Urinalysis UA today with 1+ blood and 2+ protein on dip, otherwise negative. Microscopic exam shows 0-5 WBC per high-powered field, 0-2 red blood cells per high-powered field.  Pertinent Imaging: N/a  Assessment & Plan:    1. Microscopic hematuria S/p negative work up 01/2015 No further microscopic hematuria, UA negative today - Urinalysis, Complete   Return if symptoms worsen or fail to improve.  Hollice Espy, MD  [facility] [address], [place], [place] [address] [phone]

## 2016-02-28 NOTE — Progress Notes (Signed)
Rhonda [name]  [phone] Fax:(336) [phone]  ID: [name] OB: [birth year]  MR#: [id number]  [id number]  Patient Care Team: Lavera Guise, MD as PCP - General (Internal Medicine)  CHIEF COMPLAINT:  Hypercalcemia  INTERVAL HISTORY: Patient is an 82 year old female who was noted to have a steadily rising calcium level on routine blood work. She has no other reported lab abnormalities. She complains of an occasional "bony aches", but otherwise feels well. She denies any pain. She has no neurologic complaints. She denies any recent fevers or illnesses. She has a good appetite and denies weight loss. She denies any nausea, vomiting, constipation, or diarrhea. She has no melena or hematochezia. She has no urinary complaints. Patient otherwise feels well and offers no further specific complaints.   REVIEW OF SYSTEMS:   Review of Systems  Constitutional: Negative.  Negative for fever, malaise/fatigue and weight loss.  Respiratory: Negative.  Negative for cough and shortness of breath.   Cardiovascular: Negative.  Negative for chest pain and leg swelling.  Gastrointestinal: Negative.  Negative for abdominal pain, blood in stool and melena.  Genitourinary: Negative.   Musculoskeletal: Negative.   Neurological: Negative.  Negative for weakness.  Psychiatric/Behavioral: Negative.  The patient is not nervous/anxious.     As per HPI. Otherwise, a complete review of systems is negative.  PAST MEDICAL HISTORY: Past Medical History:  Diagnosis Date  . Arthritis    hands  . Cataract   . GERD (gastroesophageal reflux disease)   . Hyperlipemia   . Hypothyroidism   . Osteoporosis   . Skin cancer    basal cell    PAST SURGICAL HISTORY: Past Surgical History:  Procedure Laterality Date  . BREAST EXCISIONAL BIOPSY Right 15+ yrs ago   EXCISIONAL - NEG  . CATARACT EXTRACTION W/ INTRAOCULAR LENS IMPLANT    . COLONOSCOPY WITH PROPOFOL N/A 02/21/2015   Procedure: COLONOSCOPY WITH PROPOFOL;  Surgeon: Lucilla Lame, MD;  Location: [place];  Service: Endoscopy;  Laterality: N/A;  . [place]  . POLYPECTOMY  02/21/2015   Procedure: POLYPECTOMY;  Surgeon: Lucilla Lame, MD;  Location: [place];  Service: Endoscopy;;    FAMILY HISTORY: Family History  Problem Relation Age of Onset  . Heart disease Mother   . Stroke Father   . Heart disease Brother   . Kidney cancer Neg Hx   . Prostate cancer Neg Hx   . Bladder Cancer Neg Hx   . Breast cancer Neg Hx     ADVANCED DIRECTIVES (Y/N):  N  HEALTH MAINTENANCE: Social History  Substance Use Topics  . Smoking status: Never Smoker  . Smokeless tobacco: Never Used  . Alcohol use No     Colonoscopy:  PAP:  Bone density:  Lipid panel:  No Known Allergies  Current Outpatient Prescriptions  Medication Sig Dispense Refill  . aspirin EC 81 MG tablet Take 81 mg by mouth daily.    . Cyanocobalamin (B-12 PO) Take 1 tablet by mouth daily.    . fexofenadine (ALLEGRA) 30 MG tablet Take 30 mg by mouth 2 (two) times daily.    Marland Kitchen levothyroxine (SYNTHROID, LEVOTHROID) 50 MCG tablet Take 50 mcg by mouth daily before breakfast.     . Multiple Vitamins-Minerals (CENTRUM SILVER PO) Take 1 tablet by mouth daily.    . RESTASIS 0.05 % ophthalmic emulsion 1 drop daily.     Marland Kitchen triamcinolone (NASACORT ALLERGY 24HR) 55 MCG/ACT AERO nasal inhaler Place 2 sprays into the  nose 2 (two) times daily.     No current facility-administered medications for this visit.     OBJECTIVE: Vitals:   03/01/16 0954  BP: 138/80  Pulse: 73  Resp: 18  Temp: 97.5 F (36.4 C)     Body mass index is 23.91 kg/m.    ECOG FS:0 - Asymptomatic  General: Well-developed, well-nourished, no acute distress. Eyes: Pink conjunctiva, anicteric sclera. HEENT: Normocephalic, moist mucous membranes, clear oropharnyx. Lungs: Clear to auscultation bilaterally. Heart: Regular rate and rhythm. No rubs,  murmurs, or gallops. Abdomen: Soft, nontender, nondistended. No organomegaly noted, normoactive bowel sounds. Musculoskeletal: No edema, cyanosis, or clubbing. Neuro: Alert, answering all questions appropriately. Cranial nerves grossly intact. Skin: No rashes or petechiae noted. Psych: Normal affect. Lymphatics: No cervical, calvicular, axillary or inguinal LAD.   LAB RESULTS:  Lab Results  Component Value Date   NA 135 03/01/2016   K 4.3 03/01/2016   CL 107 03/01/2016   CO2 24 03/01/2016   GLUCOSE 95 03/01/2016   BUN 19 03/01/2016   CREATININE 0.69 03/01/2016   CALCIUM 10.7 (H) 03/01/2016   GFRNONAA >60 03/01/2016   GFRAA >60 03/01/2016    Lab Results  Component Value Date   WBC 5.7 03/01/2016   NEUTROABS 3.9 03/01/2016   HGB 12.6 03/01/2016   HCT 35.9 03/01/2016   MCV 93.9 03/01/2016   PLT 187 03/01/2016     STUDIES: Mm Screening Breast Tomo Bilateral  Result Date: 02/29/2016 CLINICAL DATA:  Screening. EXAM: 2D DIGITAL SCREENING BILATERAL MAMMOGRAM WITH CAD AND ADJUNCT TOMO COMPARISON:  Previous exam(s). ACR Breast Density Category c: The breast tissue is heterogeneously dense, which may obscure small masses. FINDINGS: There are no findings suspicious for malignancy. Images were processed with CAD. IMPRESSION: No mammographic evidence of malignancy. A result letter of this screening mammogram will be mailed directly to the patient. RECOMMENDATION: Screening mammogram in one year. (Code:SM-B-01Y) BI-RADS CATEGORY  1: Negative. Electronically Signed   By: Fidela Salisbury M.D.   On: 02/29/2016 14:01    ASSESSMENT: Hypercalcemia  PLAN:    1.  Hypercalcemia: Patient's calcium levels continue to be mildly elevated, but decreased from previous lab draws. SPEP, kappa and lambda light chains, and immunoglobulins are pending at time of dictation. Will also get a metastatic bone survey to assess for any bony lesions for completeness. No further intervention is needed at this  time. Patient will return to clinic in 1 month with repeat laboratory work, further evaluation, discussion of her imaging results.  Patient expressed understanding and was in agreement with this plan. She also understands that She can call clinic at any time with any questions, concerns, or complaints.   No matching staging information was found for the patient.  Lloyd Huger, MD   03/02/2016 8:50 AM

## 2016-02-29 ENCOUNTER — Ambulatory Visit
Admission: RE | Admit: 2016-02-29 | Discharge: 2016-02-29 | Disposition: A | Discharge status: Home / Self Care | Payer: Medicare Other | Source: Ambulatory Visit | Attending: Physician Assistant | Admitting: Physician Assistant

## 2016-02-29 ENCOUNTER — Other Ambulatory Visit: Payer: Self-pay | Admitting: Physician Assistant

## 2016-02-29 DIAGNOSIS — Z1231 Encounter for screening mammogram for malignant neoplasm of breast: Secondary | ICD-10-CM | POA: Diagnosis present

## 2016-03-01 ENCOUNTER — Encounter: Payer: Self-pay | Admitting: Oncology

## 2016-03-01 ENCOUNTER — Ambulatory Visit
Admission: RE | Admit: 2016-03-01 | Discharge: 2016-03-01 | Disposition: A | Discharge status: Home / Self Care | Payer: Medicare Other | Source: Ambulatory Visit | Attending: Oncology | Admitting: Oncology

## 2016-03-01 ENCOUNTER — Inpatient Hospital Stay: Payer: Medicare Other | Attending: Oncology | Admitting: Oncology

## 2016-03-01 ENCOUNTER — Ambulatory Visit: Payer: Medicare Other

## 2016-03-01 ENCOUNTER — Encounter (INDEPENDENT_AMBULATORY_CARE_PROVIDER_SITE_OTHER): Payer: Self-pay

## 2016-03-01 DIAGNOSIS — E785 Hyperlipidemia, unspecified: Secondary | ICD-10-CM | POA: Insufficient documentation

## 2016-03-01 DIAGNOSIS — K219 Gastro-esophageal reflux disease without esophagitis: Secondary | ICD-10-CM | POA: Insufficient documentation

## 2016-03-01 DIAGNOSIS — E039 Hypothyroidism, unspecified: Secondary | ICD-10-CM | POA: Insufficient documentation

## 2016-03-01 DIAGNOSIS — M818 Other osteoporosis without current pathological fracture: Secondary | ICD-10-CM | POA: Diagnosis not present

## 2016-03-01 DIAGNOSIS — Z79899 Other long term (current) drug therapy: Secondary | ICD-10-CM | POA: Diagnosis not present

## 2016-03-01 DIAGNOSIS — M1990 Unspecified osteoarthritis, unspecified site: Secondary | ICD-10-CM | POA: Insufficient documentation

## 2016-03-01 LAB — BASIC METABOLIC PANEL
ANION GAP: 4 — AB (ref 5–15)
BUN: 19 mg/dL (ref 6–20)
CO2: 24 mmol/L (ref 22–32)
Calcium: 10.7 mg/dL — ABNORMAL HIGH (ref 8.9–10.3)
Chloride: 107 mmol/L (ref 101–111)
Creatinine, Ser: 0.69 mg/dL (ref 0.44–1.00)
GFR calc Af Amer: >60 mL/min (ref >60)
GLUCOSE: 95 mg/dL (ref 65–99)
POTASSIUM: 4.3 mmol/L (ref 3.5–5.1)
Sodium: 135 mmol/L (ref 135–145)

## 2016-03-01 LAB — CBC WITH DIFFERENTIAL/PLATELET
BASOS ABS: 0.1 10*3/uL (ref 0–0.1)
Basophils Relative: 1 %
EOS PCT: 0 %
Eosinophils Absolute: 0 10*3/uL (ref 0–0.7)
HCT: 35.9 % (ref 35.0–47.0)
Hemoglobin: 12.6 g/dL (ref 12.0–16.0)
LYMPHS PCT: 20 %
Lymphs Abs: 1.1 10*3/uL (ref 1.0–3.6)
MCH: 32.9 pg (ref 26.0–34.0)
MCHC: 35.1 g/dL (ref 32.0–36.0)
MCV: 93.9 fL (ref 80.0–100.0)
Monocytes Absolute: 0.7 10*3/uL (ref 0.2–0.9)
Monocytes Relative: 12 %
NEUTROS ABS: 3.9 10*3/uL (ref 1.4–6.5)
NEUTROS PCT: 67 %
PLATELETS: 187 10*3/uL (ref 150–440)
RBC: 3.82 MIL/uL (ref 3.80–5.20)
RDW: 12.9 % (ref 11.5–14.5)
WBC: 5.7 10*3/uL (ref 3.6–11.0)

## 2016-03-01 NOTE — Progress Notes (Signed)
New evaluation for hypecalcemia. Complains of allergies and generally not feeling due to receiving flu shot yesterday.

## 2016-03-02 LAB — KAPPA/LAMBDA LIGHT CHAINS
KAPPA FREE LGHT CHN: 9.6 mg/L (ref 3.3–19.4)
Kappa, lambda light chain ratio: 0.01 — ABNORMAL LOW (ref 0.26–1.65)
LAMDA FREE LIGHT CHAINS: 1713.7 mg/L — AB (ref 5.7–26.3)

## 2016-03-02 LAB — PROTEIN ELECTRO, RANDOM URINE
Albumin ELP, Urine: 15.6 %
Alpha-1-Globulin, U: 2.2 %
Alpha-2-Globulin, U: 2.8 %
Beta Globulin, U: 4.3 %
Gamma Globulin, U: 75.1 %
M Component, Ur: 72.8 % — ABNORMAL HIGH
Total Protein, Urine: 147.3 mg/dL

## 2016-03-02 LAB — PROTEIN ELECTROPHORESIS, SERUM
A/G Ratio: 1.9 — ABNORMAL HIGH (ref 0.7–1.7)
ALPHA-1-GLOBULIN: 0.2 g/dL (ref 0.0–0.4)
Albumin ELP: 4.2 g/dL (ref 2.9–4.4)
Alpha-2-Globulin: 0.6 g/dL (ref 0.4–1.0)
Beta Globulin: 0.9 g/dL (ref 0.7–1.3)
GAMMA GLOBULIN: 0.5 g/dL (ref 0.4–1.8)
GLOBULIN, TOTAL: 2.2 g/dL (ref 2.2–3.9)
M-SPIKE, %: 0.2 g/dL — AB
TOTAL PROTEIN ELP: 6.4 g/dL (ref 6.0–8.5)

## 2016-03-12 ENCOUNTER — Ambulatory Visit
Admission: RE | Admit: 2016-03-12 | Discharge: 2016-03-12 | Disposition: A | Discharge status: Home / Self Care | Payer: Medicare Other | Source: Ambulatory Visit | Attending: Oncology | Admitting: Oncology

## 2016-03-12 DIAGNOSIS — R948 Abnormal results of function studies of other organs and systems: Secondary | ICD-10-CM | POA: Insufficient documentation

## 2016-04-02 ENCOUNTER — Inpatient Hospital Stay (HOSPITAL_BASED_OUTPATIENT_CLINIC_OR_DEPARTMENT_OTHER): Payer: Medicare Other | Admitting: Oncology

## 2016-04-02 ENCOUNTER — Inpatient Hospital Stay: Payer: Medicare Other | Attending: Oncology

## 2016-04-02 VITALS — BP 152/77 | HR 77 | Temp 97.7°F | Resp 18 | Wt 131.0 lb

## 2016-04-02 DIAGNOSIS — D472 Monoclonal gammopathy: Secondary | ICD-10-CM

## 2016-04-02 DIAGNOSIS — Z85828 Personal history of other malignant neoplasm of skin: Secondary | ICD-10-CM | POA: Insufficient documentation

## 2016-04-02 DIAGNOSIS — Z7982 Long term (current) use of aspirin: Secondary | ICD-10-CM | POA: Insufficient documentation

## 2016-04-02 DIAGNOSIS — Z79899 Other long term (current) drug therapy: Secondary | ICD-10-CM

## 2016-04-02 DIAGNOSIS — E039 Hypothyroidism, unspecified: Secondary | ICD-10-CM | POA: Insufficient documentation

## 2016-04-02 DIAGNOSIS — E785 Hyperlipidemia, unspecified: Secondary | ICD-10-CM | POA: Insufficient documentation

## 2016-04-02 DIAGNOSIS — M1990 Unspecified osteoarthritis, unspecified site: Secondary | ICD-10-CM | POA: Diagnosis not present

## 2016-04-02 DIAGNOSIS — K219 Gastro-esophageal reflux disease without esophagitis: Secondary | ICD-10-CM | POA: Insufficient documentation

## 2016-04-02 LAB — CALCIUM: CALCIUM: 10.4 mg/dL — AB (ref 8.9–10.3)

## 2016-04-02 NOTE — Progress Notes (Signed)
States is feeling well. Offers no complaints. 

## 2016-04-02 NOTE — Progress Notes (Signed)
[facility]  Telephone:(336) [phone] Fax:(336) [phone]  ID: Rhonda [name] OB: 1933/08/02  MR#: [id number]  VHQ#:[id number]  Patient Care Team: Lavera Guise, MD as PCP - General (Internal Medicine)  CHIEF COMPLAINT:  MGUS  INTERVAL HISTORY: Patient returns to clinic today for further evaluation, discussion of her laboratory work, and additional diagnostic planning. She continues to have occasional "bony aches", but otherwise feels well. She denies any [name]. She has no neurologic complaints. She denies any recent fevers or illnesses. She has a good appetite and denies weight loss. She denies any nausea, vomiting, constipation, or diarrhea. She has no melena or hematochezia. She has no urinary complaints. Patient otherwise feels well and offers no further specific complaints.   REVIEW OF SYSTEMS:   Review of Systems  Constitutional: Negative.  Negative for fever, malaise/fatigue and weight loss.  Respiratory: Negative.  Negative for cough and shortness of breath.   Cardiovascular: Negative.  Negative for chest [name] and leg swelling.  Gastrointestinal: Negative.  Negative for abdominal [name], blood in stool and melena.  Genitourinary: Negative.   Musculoskeletal: Negative.   Neurological: Negative.  Negative for weakness.  Psychiatric/Behavioral: Negative.  The patient is not nervous/anxious.     As per HPI. Otherwise, a complete review of systems is negative.  PAST MEDICAL HISTORY: Past Medical History:  Diagnosis Date  . Arthritis    hands  . Cataract   . GERD (gastroesophageal reflux disease)   . Hyperlipemia   . Hypothyroidism   . Osteoporosis   . Skin cancer    basal cell    PAST SURGICAL HISTORY: Past Surgical History:  Procedure Laterality Date  . BREAST EXCISIONAL BIOPSY Right 15+ yrs ago   EXCISIONAL - NEG  . CATARACT EXTRACTION W/ INTRAOCULAR LENS IMPLANT    . COLONOSCOPY WITH PROPOFOL N/A 02/21/2015   Procedure: COLONOSCOPY WITH PROPOFOL;   Surgeon: Lucilla Lame, MD;  Location: [place] [facility];  Service: Endoscopy;  Laterality: N/A;  . [place]  . POLYPECTOMY  02/21/2015   Procedure: POLYPECTOMY;  Surgeon: Lucilla Lame, MD;  Location: [facility];  Service: Endoscopy;;    FAMILY HISTORY: Family History  Problem Relation Age of Onset  . Heart disease Mother   . Stroke Father   . Heart disease Brother   . Kidney cancer Neg Hx   . Prostate cancer Neg Hx   . Bladder Cancer Neg Hx   . Breast cancer Neg Hx     ADVANCED DIRECTIVES (Y/N):  N  HEALTH MAINTENANCE: Social History  Substance Use Topics  . Smoking status: Never Smoker  . Smokeless tobacco: Never Used  . Alcohol use No     Colonoscopy:  PAP:  Bone density:  Lipid panel:  No Known Allergies  Current Outpatient Prescriptions  Medication Sig Dispense Refill  . aspirin EC 81 MG tablet Take 81 mg by mouth daily.    . Cyanocobalamin (B-12 PO) Take 1 tablet by mouth daily.    . fexofenadine (ALLEGRA) 30 MG tablet Take 30 mg by mouth 2 (two) times daily.    Marland Kitchen levothyroxine (SYNTHROID, LEVOTHROID) 50 MCG tablet Take 50 mcg by mouth daily before breakfast.     . Multiple Vitamins-Minerals (CENTRUM SILVER PO) Take 1 tablet by mouth daily.    . RESTASIS 0.05 % ophthalmic emulsion 1 drop daily.     Marland Kitchen triamcinolone (NASACORT ALLERGY 24HR) 55 MCG/ACT AERO nasal inhaler Place 2 sprays into the nose 2 (two) times daily.  No current facility-administered medications for this visit.     OBJECTIVE: Vitals:   04/02/16 1051  BP: (!) 152/77  Pulse: 77  Resp: 18  Temp: 97.7 F (36.5 C)     Body mass index is 23.95 kg/m.    ECOG FS:0 - Asymptomatic  General: Well-developed, well-nourished, no acute distress. Eyes: Pink conjunctiva, anicteric sclera. Lungs: Clear to auscultation bilaterally. Heart: Regular rate and rhythm. No rubs, murmurs, or gallops. Abdomen: Soft, nontender, nondistended. No organomegaly noted, normoactive  bowel sounds. Musculoskeletal: No edema, cyanosis, or clubbing. Neuro: Alert, answering all questions appropriately. Cranial nerves grossly intact. Skin: No rashes or petechiae noted. Psych: Normal affect.   LAB RESULTS:  Lab Results  Component Value Date   NA 135 03/01/2016   K 4.3 03/01/2016   CL 107 03/01/2016   CO2 24 03/01/2016   GLUCOSE 95 03/01/2016   BUN 19 03/01/2016   CREATININE 0.69 03/01/2016   CALCIUM 10.4 (H) 04/02/2016   GFRNONAA >60 03/01/2016   GFRAA >60 03/01/2016    Lab Results  Component Value Date   WBC 5.7 03/01/2016   NEUTROABS 3.9 03/01/2016   HGB 12.6 03/01/2016   HCT 35.9 03/01/2016   MCV 93.9 03/01/2016   PLT 187 03/01/2016     STUDIES: Dg Bone Survey Met  Result Date: 03/12/2016 CLINICAL DATA:  Elevated calcium level.  History of skin cancer. EXAM: METASTATIC BONE SURVEY COMPARISON:  None. FINDINGS: One-view AP chest: Heart size is normal. Aortic atherosclerosis. Lungs are clear. No effusions. No bone abnormalities seen on the frontal view of the thorax. Two-view cervical spine: Ordinary mid cervical spondylosis. No fracture or focal lesion. Skull two views: Prominent trabecular pattern could be seen an metabolic bone disease. The pattern would be unusual for malignant etiologies such as myeloma, though that is possible. Bilateral shoulders: Prominent trabecular pattern with questionable medullary expansion. Again, this could be secondary to metabolic bone disease. This appearance could be associated with myeloma or other marrow expansive processes. Bilateral humeri: Medullary expansile pattern/ osteopenia pattern present throughout both humeri. Bilateral forearm: Prominent trabecular/ medullary expansion pattern as seen more proximally. AP pelvis: No pelvic finding. No hip joint disease. Ordinary osteitis pubis. Bilateral femurs: More subtle but abnormal pattern similar to the humeri. Bilateral tib-fib: Within normal limits. Lumbar spine: Chronic  degenerative disc disease L4-5. Superior endplate fracture more towards the left at L1. This was present on a CT abdomen 03/10/2014. Thoracic spine:  Negative IMPRESSION: Abnormal appearance of the bones most prominent in the humeri but also affecting the skull and the other appendicular bones. I think the differential diagnosis is that of metabolic bone disease versus expansile marrow process including myeloma. I think bone marrow evaluation is worth considering given this pattern. Electronically Signed   By: Nelson Chimes M.D.   On: 03/12/2016 15:32    ASSESSMENT: MGUS  PLAN:    1.  MGUS: Patient's calcium levels continue to be mildly elevated, but decreased from previous lab draws. Although patient's M spike is only 0.2, her lambda free chains are significantly elevated greater than 1700. Bone marrow biopsy reported as above also suspicious of underlying multiple myeloma. Have scheduled bone marrow biopsy for further evaluation and definitive diagnosis. Patient will return to clinic approximately one week after her biopsy to discuss the results and treatment planning.  Approximately 30 minutes was spent in discussion of which greater than 50% was consultation.   Patient expressed understanding and was in agreement with this plan. She also understands that She  can call clinic at any time with any questions, concerns, or complaints.   No matching staging information was found for the patient.  Lloyd Huger, MD   04/07/2016 9:23 AM

## 2016-04-05 ENCOUNTER — Other Ambulatory Visit: Payer: Self-pay | Admitting: Radiology

## 2016-04-06 ENCOUNTER — Other Ambulatory Visit: Payer: Self-pay | Admitting: Radiology

## 2016-04-09 ENCOUNTER — Encounter: Payer: Self-pay | Admitting: Oncology

## 2016-04-09 ENCOUNTER — Ambulatory Visit (HOSPITAL_COMMUNITY)
Admission: RE | Admit: 2016-04-09 | Discharge: 2016-04-09 | Disposition: A | Discharge status: Home / Self Care | Payer: Medicare Other | Source: Ambulatory Visit | Attending: Oncology | Admitting: Oncology

## 2016-04-09 ENCOUNTER — Encounter (HOSPITAL_COMMUNITY): Payer: Self-pay

## 2016-04-09 DIAGNOSIS — Z85828 Personal history of other malignant neoplasm of skin: Secondary | ICD-10-CM | POA: Insufficient documentation

## 2016-04-09 DIAGNOSIS — K219 Gastro-esophageal reflux disease without esophagitis: Secondary | ICD-10-CM | POA: Insufficient documentation

## 2016-04-09 DIAGNOSIS — D499 Neoplasm of unspecified behavior of unspecified site: Secondary | ICD-10-CM | POA: Diagnosis not present

## 2016-04-09 DIAGNOSIS — E785 Hyperlipidemia, unspecified: Secondary | ICD-10-CM | POA: Diagnosis not present

## 2016-04-09 DIAGNOSIS — E039 Hypothyroidism, unspecified: Secondary | ICD-10-CM | POA: Insufficient documentation

## 2016-04-09 DIAGNOSIS — M1990 Unspecified osteoarthritis, unspecified site: Secondary | ICD-10-CM | POA: Diagnosis not present

## 2016-04-09 DIAGNOSIS — C9000 Multiple myeloma not having achieved remission: Secondary | ICD-10-CM | POA: Diagnosis present

## 2016-04-09 DIAGNOSIS — D472 Monoclonal gammopathy: Secondary | ICD-10-CM | POA: Insufficient documentation

## 2016-04-09 DIAGNOSIS — M810 Age-related osteoporosis without current pathological fracture: Secondary | ICD-10-CM | POA: Insufficient documentation

## 2016-04-09 LAB — PROTIME-INR
INR: 0.92
Prothrombin Time: 12.4 seconds (ref 11.4–15.2)

## 2016-04-09 LAB — CBC WITH DIFFERENTIAL/PLATELET
BASOS ABS: 0.1 10*3/uL (ref 0.0–0.1)
Basophils Relative: 1 %
Eosinophils Absolute: 0.1 10*3/uL (ref 0.0–0.7)
Eosinophils Relative: 1 %
HEMATOCRIT: 39.7 % (ref 36.0–46.0)
HEMOGLOBIN: 13.5 g/dL (ref 12.0–15.0)
LYMPHS PCT: 25 %
Lymphs Abs: 1.2 10*3/uL (ref 0.7–4.0)
MCH: 32.2 pg (ref 26.0–34.0)
MCHC: 34 g/dL (ref 30.0–36.0)
MCV: 94.7 fL (ref 78.0–100.0)
Monocytes Absolute: 0.7 10*3/uL (ref 0.1–1.0)
Monocytes Relative: 14 %
NEUTROS ABS: 2.9 10*3/uL (ref 1.7–7.7)
NEUTROS PCT: 59 %
PLATELETS: 193 10*3/uL (ref 150–400)
RBC: 4.19 MIL/uL (ref 3.87–5.11)
RDW: 12.8 % (ref 11.5–15.5)
WBC: 5 10*3/uL (ref 4.0–10.5)

## 2016-04-09 LAB — BONE MARROW EXAM

## 2016-04-09 MED ORDER — FENTANYL CITRATE (PF) 100 MCG/2ML IJ SOLN
INTRAMUSCULAR | Status: AC | PRN
  Administered 2016-04-09: 50 ug via INTRAVENOUS

## 2016-04-09 MED ORDER — FENTANYL CITRATE (PF) 100 MCG/2ML IJ SOLN
INTRAMUSCULAR | Status: AC
  Filled 2016-04-09: qty 4

## 2016-04-09 MED ORDER — SODIUM CHLORIDE 0.9 % IV SOLN
INTRAVENOUS | Status: DC
  Administered 2016-04-09: 10:00:00 via INTRAVENOUS

## 2016-04-09 MED ORDER — MIDAZOLAM HCL 2 MG/2ML IJ SOLN
INTRAMUSCULAR | Status: AC
  Filled 2016-04-09: qty 4

## 2016-04-09 MED ORDER — MIDAZOLAM HCL 2 MG/2ML IJ SOLN
INTRAMUSCULAR | Status: AC | PRN
  Administered 2016-04-09: 1 mg via INTRAVENOUS

## 2016-04-09 NOTE — Discharge Instructions (Addendum)
Needle Biopsy of the Bone, Care After °Refer to this sheet in the next few weeks. These instructions provide you with information about caring for yourself after your procedure. Your health care provider may also give you more specific instructions. Your treatment has been planned according to current medical practices, but problems sometimes occur. Call your health care provider if you have any problems or questions after your procedure. °WHAT TO EXPECT AFTER THE PROCEDURE  °After your procedure, it is common to have soreness or tenderness at the puncture site. °HOME CARE INSTRUCTIONS °· Take over-the-counter and prescription medicines only as told by your health care provider. °· Bathe and shower as told by your health care provider. °· Follow instructions from your health care provider about: °¨ How to take care of your puncture site. °¨ When and how you should change your bandage (dressing). °¨ When you should remove your dressing. °· Check your puncture site every day for signs of infection. Watch for: °¨ Redness, swelling, or worsening pain. °¨ Fluid, blood, or pus. °· Return to your normal activities as told by your health care provider. °· Keep all follow-up visits as told by your health care provider. This is important. °SEEK MEDICAL CARE IF: °· You have redness, swelling, or worsening pain at the site of your puncture. °· You have fluid, blood, or pus coming from your puncture site. °· You have a fever. °· You have persistent nausea or vomiting. °SEEK IMMEDIATE MEDICAL CARE IF: °· You develop a rash. °· You have difficulty breathing. °  °This information is not intended to replace advice given to you by your health care provider. Make sure you discuss any questions you have with your health care provider. °  °Document Released: 12/15/2004 Document Revised: 02/16/2015 Document Reviewed: 07/05/2014 °Elsevier Interactive Patient Education ©2016 [employer] °Moderate Conscious Sedation, Adult, Care  After °Refer to this sheet in the next few weeks. These instructions provide you with information on caring for yourself after your procedure. Your health care provider may also give you more specific instructions. Your treatment has been planned according to current medical practices, but problems sometimes occur. Call your health care provider if you have any problems or questions after your procedure. °WHAT TO EXPECT AFTER THE PROCEDURE  °After your procedure: °· You may feel sleepy, clumsy, and have poor balance for several hours. °· Vomiting may occur if you eat too soon after the procedure. °HOME CARE INSTRUCTIONS °· Do not participate in any activities where you could become injured for at least 24 hours. Do not: °¨ Drive. °¨ Swim. °¨ Ride a bicycle. °¨ Operate heavy machinery. °¨ Cook. °¨ Use power tools. °¨ Climb ladders. °¨ Work from a high place. °· Do not make important decisions or sign legal documents until you are improved. °· If you vomit, drink water, juice, or soup when you can drink without vomiting. Make sure you have little or no nausea before eating solid foods. °· Only take over-the-counter or prescription medicines for pain, discomfort, or fever as directed by your health care provider. °· Make sure you and your family fully understand everything about the medicines given to you, including what side effects may occur. °· You should not drink alcohol, take sleeping pills, or take medicines that cause drowsiness for at least 24 hours. °· If you smoke, do not smoke without supervision. °· If you are feeling better, you may resume normal activities 24 hours after you were sedated. °· Keep all appointments with your health   care provider. °SEEK MEDICAL CARE IF: °· Your skin is pale or bluish in color. °· You continue to feel nauseous or vomit. °· Your pain is getting worse and is not helped by medicine. °· You have bleeding or swelling. °· You are still sleepy or feeling clumsy after 24  hours. °SEEK IMMEDIATE MEDICAL CARE IF: °· You develop a rash. °· You have difficulty breathing. °· You develop any type of allergic problem. °· You have a fever. °MAKE SURE YOU: °· Understand these instructions. °· Will watch your condition. °· Will get help right away if you are not doing well or get worse. °  °This information is not intended to replace advice given to you by your health care provider. Make sure you discuss any questions you have with your health care provider. °  °Document Released: 03/18/2013 Document Revised: 06/18/2014 Document Reviewed: 03/18/2013 °Elsevier Interactive Patient Education ©2016 [employer] Inc[employer] ° °

## 2016-04-09 NOTE — H&P (Signed)
Chief Complaint: MGUS, possible myeloma  Referring Physician:Dr. Kellie Simmering  Supervising Physician: Daryll Brod  Patient Status: [facility] - Out-pt  HPI: Rhonda [name] is an 82 y.o. female with minimal PMH who was referred to oncology secondary to elevated calcium levels.  After further work up she was felt to like have MGUS and possible underlying multiple myeloma.  A request for a bone marrow biopsy has been made.  She denies any current symptoms or complaints.  She denies CP, SOB, fevers, etc.  She presents today for her BMBX.  Past Medical History:  Past Medical History:  Diagnosis Date  . Arthritis    hands  . Cataract   . GERD (gastroesophageal reflux disease)   . Hyperlipemia   . Hypothyroidism   . Osteoporosis   . Skin cancer    basal cell    Past Surgical History:  Past Surgical History:  Procedure Laterality Date  . BREAST EXCISIONAL BIOPSY Right 15+ yrs ago   EXCISIONAL - NEG  . CATARACT EXTRACTION W/ INTRAOCULAR LENS IMPLANT    . COLONOSCOPY WITH PROPOFOL N/A 02/21/2015   Procedure: COLONOSCOPY WITH PROPOFOL;  Surgeon: Lucilla Lame, MD;  Location: [place];  Service: Endoscopy;  Laterality: N/A;  . [place]  . POLYPECTOMY  02/21/2015   Procedure: POLYPECTOMY;  Surgeon: Lucilla Lame, MD;  Location: [place];  Service: Endoscopy;;    Family History:  Family History  Problem Relation Age of Onset  . Heart disease Mother   . Stroke Father   . Heart disease Brother   . Kidney cancer Neg Hx   . Prostate cancer Neg Hx   . Bladder Cancer Neg Hx   . Breast cancer Neg Hx     Social History:  reports that she has never smoked. She has never used smokeless tobacco. She reports that she does not drink alcohol or use drugs.  Allergies: No Known Allergies  Medications: Medications reviewed in Epic  Please HPI for pertinent positives, otherwise complete 10 system ROS negative.  Mallampati Score: MD  Evaluation Airway: WNL Heart: WNL Abdomen: WNL Chest/ Lungs: WNL ASA  Classification: 2 Mallampati/Airway Score: Two  Physical Exam: BP 135/80 (BP Location: Right Arm)   Pulse 65   Temp 97.6 F (36.4 C) (Oral)   Resp 16   SpO2 98%  There is no height or weight on file to calculate BMI. General: pleasant, WD, WN white female who is laying in bed in NAD HEENT: head is normocephalic, atraumatic.  Sclera are noninjected.  PERRL.  Ears and nose without any masses or lesions.  Mouth is pink and moist Heart: regular, rate, and rhythm.  Normal s1,s2. No obvious murmurs, gallops, or rubs noted.  Palpable radial and pedal pulses bilaterally Lungs: CTAB, no wheezes, rhonchi, or rales noted.  Respiratory effort nonlabored Abd: soft, NT, ND, +BS, no masses, hernias, or organomegaly MS: all 4 extremities are symmetrical with no cyanosis, clubbing, or edema. Psych: A&Ox3 with an appropriate affect.   Labs: Pending  Imaging: No results found.  Assessment/Plan 1. MGUS, possible multiple myeloma -we will proceed today with a bone marrow biopsy -vitals reviewed, labs are currently pending -Risks and Benefits discussed with the patient including, but not limited to bleeding, infection, damage to adjacent structures or low yield requiring additional tests. All of the patient's questions were answered, patient is agreeable to proceed. Consent signed and in chart.  Thank you for this interesting consult.  I greatly enjoyed meeting  [name] and look forward to participating in their care.  A copy of this report was sent to the requesting provider on this date.  Electronically Signed: Henreitta Cea 04/09/2016, 9:36 AM   I spent a total of  30 Minutes   in face to face in clinical consultation, greater than 50% of which was counseling/coordinating care for MGUS

## 2016-04-09 NOTE — Procedures (Signed)
Myeloma   S/p RT ILIAC BM ASP AND CORE BX No comp Stable EBL 0 Path pending Full report in PACS

## 2016-04-15 NOTE — Progress Notes (Signed)
Rhonda [name]  [phone] [phone]) [phone]  ID: Rhonda [name] OB: 1934-02-22  MR#: [id number]  VKF#:[id number]  Patient Care Team: Lavera Guise, MD as PCP - General (Internal Medicine)  CHIEF COMPLAINT:  Multiple myeloma not in remission  INTERVAL HISTORY: Patient returns to clinic today for further evaluation, discussion of her bone marrow biopsy results, and treatment planning. She currently feels well and is asymptomatic. She denies any [name]. She has no neurologic complaints. She denies any recent fevers or illnesses. She has a good appetite and denies weight loss. She denies any nausea, vomiting, constipation, or diarrhea. She has no melena or hematochezia. She has no urinary complaints. Patient otherwise feels well and offers no further specific complaints.   REVIEW OF SYSTEMS:   Review of Systems  Constitutional: Negative.  Negative for fever, malaise/fatigue and weight loss.  Respiratory: Negative.  Negative for cough and shortness of breath.   Cardiovascular: Negative.  Negative for chest [name] and leg swelling.  Gastrointestinal: Negative.  Negative for abdominal [name], blood in stool and melena.  Genitourinary: Negative.   Musculoskeletal: Negative.   Neurological: Negative.  Negative for weakness.  Psychiatric/Behavioral: Negative.  The patient is not nervous/anxious.     As per HPI. Otherwise, a complete review of systems is negative.  PAST MEDICAL HISTORY: Past Medical History:  Diagnosis Date  . Arthritis    hands  . Cataract   . GERD (gastroesophageal reflux disease)   . Hyperlipemia   . Hypothyroidism   . Osteoporosis   . Skin cancer    basal cell    PAST SURGICAL HISTORY: Past Surgical History:  Procedure Laterality Date  . BREAST EXCISIONAL BIOPSY Right 15+ yrs ago   EXCISIONAL - NEG  . CATARACT EXTRACTION W/ INTRAOCULAR LENS IMPLANT    . COLONOSCOPY WITH PROPOFOL N/A 02/21/2015   Procedure: COLONOSCOPY WITH PROPOFOL;   Surgeon: Lucilla Lame, MD;  Location: [place];  Service: Endoscopy;  Laterality: N/A;  . [place]  . POLYPECTOMY  02/21/2015   Procedure: POLYPECTOMY;  Surgeon: Lucilla Lame, MD;  Location: [place];  Service: Endoscopy;;    FAMILY HISTORY: Family History  Problem Relation Age of Onset  . Heart disease Mother   . Stroke Father   . Heart disease Brother   . Kidney cancer Neg Hx   . Prostate cancer Neg Hx   . Bladder Cancer Neg Hx   . Breast cancer Neg Hx     ADVANCED DIRECTIVES (Y/N):  N  HEALTH MAINTENANCE: Social History  Substance Use Topics  . Smoking status: Never Smoker  . Smokeless tobacco: Never Used  . Alcohol use No     Colonoscopy:  PAP:  Bone density:  Lipid panel:  No Known Allergies  Current Outpatient Prescriptions  Medication Sig Dispense Refill  . aspirin EC 81 MG tablet Take 81 mg by mouth daily.    . Cyanocobalamin (B-12 PO) Take 1 tablet by mouth daily.    . fexofenadine (ALLEGRA) 30 MG tablet Take 30 mg by mouth 2 (two) times daily.    Marland Kitchen levothyroxine (SYNTHROID, LEVOTHROID) 50 MCG tablet Take 50 mcg by mouth daily before breakfast.     . Multiple Vitamins-Minerals (CENTRUM SILVER PO) Take 1 tablet by mouth daily.    . RESTASIS 0.05 % ophthalmic emulsion 1 drop daily.     Marland Kitchen triamcinolone (NASACORT ALLERGY 24HR) 55 MCG/ACT AERO nasal inhaler Place 2 sprays into the nose 2 (two) times daily.  No current facility-administered medications for this visit.     OBJECTIVE: Vitals:   04/17/16 1001  BP: (!) 141/77  Pulse: 83  Resp: 18  Temp: 98 F (36.7 C)     Body mass index is 23.59 kg/m.    ECOG FS:0 - Asymptomatic  General: Well-developed, well-nourished, no acute distress. Eyes: Pink conjunctiva, anicteric sclera. Lungs: Clear to auscultation bilaterally. Heart: Regular rate and rhythm. No rubs, murmurs, or gallops. Abdomen: Soft, nontender, nondistended. No organomegaly noted, normoactive  bowel sounds. Musculoskeletal: No edema, cyanosis, or clubbing. Neuro: Alert, answering all questions appropriately. Cranial nerves grossly intact. Skin: No rashes or petechiae noted. Psych: Normal affect.   LAB RESULTS:  Lab Results  Component Value Date   NA 135 03/01/2016   K 4.3 03/01/2016   CL 107 03/01/2016   CO2 24 03/01/2016   GLUCOSE 95 03/01/2016   BUN 19 03/01/2016   CREATININE 0.69 03/01/2016   CALCIUM 10.4 (H) 04/02/2016   GFRNONAA >60 03/01/2016   GFRAA >60 03/01/2016    Lab Results  Component Value Date   WBC 5.0 2016-04-19   NEUTROABS 2.9 2016-04-19   HGB 13.5 04/19/16   HCT 39.7 Apr 19, 2016   MCV 94.7 04-19-16   PLT 193 04-19-2016   Lab Results  Component Value Date   TOTALPROTELP 6.4 03/01/2016   ALBUMINELP 4.2 03/01/2016   A1GS 0.2 03/01/2016   A2GS 0.6 03/01/2016   BETS 0.9 03/01/2016   GAMS 0.5 03/01/2016   MSPIKE 0.2 (H) 03/01/2016   SPEI Comment 03/01/2016     STUDIES: Ct Biopsy  Result Date: Apr 19, 2016 INDICATION: Monoclonal gammopathy of unknown significance, concern for multiple myeloma EXAM: CT GUIDED RIGHT ILIAC BONE MARROW ASPIRATION AND CORE BIOPSY Date:  04-19-1710/02/2016 11:39 am Radiologist:  M. Daryll Brod, MD Guidance:  CT FLUOROSCOPY TIME:  Fluoroscopy Time: None. MEDICATIONS: None. ANESTHESIA/SEDATION: 1.0 mg IV Versed; 50 mcg IV Fentanyl Moderate Sedation Time:  12 minutes The patient was continuously monitored during the procedure by the interventional radiology nurse under my direct supervision. CONTRAST:  None. COMPLICATIONS: None PROCEDURE: Informed consent was obtained from the patient following explanation of the procedure, risks, benefits and alternatives. The patient understands, agrees and consents for the procedure. All questions were addressed. A time out was performed. The patient was positioned prone and non-contrast localization CT was performed of the pelvis to demonstrate the iliac marrow spaces. Maximal  barrier sterile technique utilized including caps, mask, sterile gowns, sterile gloves, large sterile drape, hand hygiene, and Betadine prep. Under sterile conditions and local anesthesia, an 11 gauge coaxial bone biopsy needle was advanced into the right iliac marrow space. Needle position was confirmed with CT imaging. Initially, bone marrow aspiration was performed. Next, the 11 gauge outer cannula was utilized to obtain a right iliac bone marrow core biopsy. Needle was removed. Hemostasis was obtained with compression. The patient tolerated the procedure well. Samples were prepared with the cytotechnologist. No immediate complications. IMPRESSION: CT guided right iliac bone marrow aspiration and core biopsy. Electronically Signed   By: Jerilynn Mages.  Shick M.D.   On: 04-19-16 11:53    ASSESSMENT: MGUS  PLAN:    1.  MGUS: Patient's calcium levels continue to be mildly elevated, but decreased from previous lab draws. Although patient's M spike is only 0.2, her lambda free chains are significantly elevated greater than 1700. Bone marrow biopsy at 44% plasma cells with normal cytogenetics. Given the results of her metastatic bone survey, her elevated lambda free chains, and her bone marrow  biopsy, patient fits the criteria for multiple myeloma. Given patient's age and no other evidence of endorgan damage, have elected to treat with single agent Revlimid 25 mg daily for 21 days with 7 days off. Patient also will benefit from Zometa every 4 weeks. Given the fact the patient's M spike is only 0.2, will follow lambda light chains. Return to clinic in 3 weeks after Thanksgiving to initiate both Revlimid and Zometa. 2. Hypercalcemia: Mild. Secondary to underlying myeloma, Zometa as above.  Approximately 30 minutes was spent in discussion of which greater than 50% was consultation.   Patient expressed understanding and was in agreement with this plan. She also understands that She can call clinic at any time with any  questions, concerns, or complaints.   No matching staging information was found for the patient.  Lloyd Huger, MD   04/18/2016 12:30 PM

## 2016-04-16 LAB — CHROMOSOME ANALYSIS, BONE MARROW

## 2016-04-17 ENCOUNTER — Inpatient Hospital Stay: Payer: Medicare Other | Attending: Oncology | Admitting: Oncology

## 2016-04-17 VITALS — BP 141/77 | HR 83 | Temp 98.0°F | Resp 18 | Wt 129.0 lb

## 2016-04-17 DIAGNOSIS — M1990 Unspecified osteoarthritis, unspecified site: Secondary | ICD-10-CM | POA: Diagnosis not present

## 2016-04-17 DIAGNOSIS — E039 Hypothyroidism, unspecified: Secondary | ICD-10-CM | POA: Diagnosis not present

## 2016-04-17 DIAGNOSIS — E785 Hyperlipidemia, unspecified: Secondary | ICD-10-CM | POA: Diagnosis not present

## 2016-04-17 DIAGNOSIS — K219 Gastro-esophageal reflux disease without esophagitis: Secondary | ICD-10-CM | POA: Insufficient documentation

## 2016-04-17 DIAGNOSIS — Z79899 Other long term (current) drug therapy: Secondary | ICD-10-CM | POA: Diagnosis not present

## 2016-04-17 DIAGNOSIS — Z7982 Long term (current) use of aspirin: Secondary | ICD-10-CM | POA: Diagnosis not present

## 2016-04-17 DIAGNOSIS — C9000 Multiple myeloma not having achieved remission: Secondary | ICD-10-CM | POA: Insufficient documentation

## 2016-04-17 DIAGNOSIS — D472 Monoclonal gammopathy: Secondary | ICD-10-CM | POA: Insufficient documentation

## 2016-04-17 DIAGNOSIS — M818 Other osteoporosis without current pathological fracture: Secondary | ICD-10-CM | POA: Diagnosis not present

## 2016-04-17 NOTE — Progress Notes (Signed)
States is feeling well. Offers no complaints. 

## 2016-04-18 ENCOUNTER — Encounter: Payer: Self-pay | Admitting: Oncology

## 2016-04-18 DIAGNOSIS — C9001 Multiple myeloma in remission: Secondary | ICD-10-CM

## 2016-04-18 DIAGNOSIS — C9000 Multiple myeloma not having achieved remission: Secondary | ICD-10-CM

## 2016-04-18 HISTORY — DX: Multiple myeloma not having achieved remission: C90.00

## 2016-04-18 HISTORY — DX: Multiple myeloma in remission: C90.01

## 2016-04-18 MED ORDER — LENALIDOMIDE 25 MG PO CAPS: 25.0000 mg | ORAL_CAPSULE | Freq: Every day | ORAL | 5 refills | Status: DC

## 2016-04-20 ENCOUNTER — Encounter (HOSPITAL_COMMUNITY): Payer: Self-pay

## 2016-04-23 ENCOUNTER — Encounter: Payer: Self-pay | Admitting: *Deleted

## 2016-04-23 NOTE — Patient Instructions (Signed)
RX for Revlimid sent to [facility]

## 2016-04-25 ENCOUNTER — Telehealth: Payer: Self-pay | Admitting: *Deleted

## 2016-04-25 NOTE — Telephone Encounter (Signed)
States she did survey at [facility] today

## 2016-04-25 NOTE — Telephone Encounter (Signed)
I called and left a message for patient to return my call

## 2016-04-25 NOTE — Telephone Encounter (Signed)
Revlimid is ready to ship, but patient survey is being flagged, She asks that we please take care of this

## 2016-05-02 ENCOUNTER — Other Ambulatory Visit: Payer: Self-pay | Admitting: *Deleted

## 2016-05-02 DIAGNOSIS — C9000 Multiple myeloma not having achieved remission: Secondary | ICD-10-CM

## 2016-05-07 ENCOUNTER — Telehealth: Payer: Self-pay | Admitting: Pharmacist

## 2016-05-07 ENCOUNTER — Inpatient Hospital Stay (HOSPITAL_BASED_OUTPATIENT_CLINIC_OR_DEPARTMENT_OTHER): Payer: Medicare Other | Admitting: Oncology

## 2016-05-07 ENCOUNTER — Inpatient Hospital Stay: Payer: Medicare Other

## 2016-05-07 VITALS — BP 138/75 | HR 83 | Temp 99.2°F | Resp 16 | Wt 131.4 lb

## 2016-05-07 DIAGNOSIS — Z79899 Other long term (current) drug therapy: Secondary | ICD-10-CM | POA: Diagnosis not present

## 2016-05-07 DIAGNOSIS — C9000 Multiple myeloma not having achieved remission: Secondary | ICD-10-CM

## 2016-05-07 DIAGNOSIS — D472 Monoclonal gammopathy: Secondary | ICD-10-CM | POA: Diagnosis not present

## 2016-05-07 LAB — CBC WITH DIFFERENTIAL/PLATELET
BASOS ABS: 0 10*3/uL (ref 0–0.1)
Basophils Relative: 1 %
EOS ABS: 0 10*3/uL (ref 0–0.7)
EOS PCT: 1 %
HCT: 34.8 % — ABNORMAL LOW (ref 35.0–47.0)
Hemoglobin: 12.2 g/dL (ref 12.0–16.0)
LYMPHS PCT: 25 %
Lymphs Abs: 1.5 10*3/uL (ref 1.0–3.6)
MCH: 32.9 pg (ref 26.0–34.0)
MCHC: 35.1 g/dL (ref 32.0–36.0)
MCV: 93.5 fL (ref 80.0–100.0)
MONO ABS: 0.7 10*3/uL (ref 0.2–0.9)
Monocytes Relative: 13 %
Neutro Abs: 3.5 10*3/uL (ref 1.4–6.5)
Neutrophils Relative %: 60 %
PLATELETS: 187 10*3/uL (ref 150–440)
RBC: 3.72 MIL/uL — ABNORMAL LOW (ref 3.80–5.20)
RDW: 13 % (ref 11.5–14.5)
WBC: 5.8 10*3/uL (ref 3.6–11.0)

## 2016-05-07 LAB — COMPREHENSIVE METABOLIC PANEL
ALT: 14 U/L (ref 14–54)
AST: 19 U/L (ref 15–41)
Albumin: 4.4 g/dL (ref 3.5–5.0)
Alkaline Phosphatase: 104 U/L (ref 38–126)
Anion gap: 7 (ref 5–15)
BUN: 17 mg/dL (ref 6–20)
CHLORIDE: 108 mmol/L (ref 101–111)
CO2: 24 mmol/L (ref 22–32)
Calcium: 9.9 mg/dL (ref 8.9–10.3)
Creatinine, Ser: 0.68 mg/dL (ref 0.44–1.00)
Glucose, Bld: 90 mg/dL (ref 65–99)
POTASSIUM: 4.1 mmol/L (ref 3.5–5.1)
SODIUM: 139 mmol/L (ref 135–145)
Total Bilirubin: 0.6 mg/dL (ref 0.3–1.2)
Total Protein: 6.8 g/dL (ref 6.5–8.1)

## 2016-05-07 MED ORDER — SODIUM CHLORIDE 0.9 % IV SOLN
3.3000 mg | Freq: Once | INTRAVENOUS | Status: AC
  Administered 2016-05-07: 3.3 mg via INTRAVENOUS
  Filled 2016-05-07: qty 4.13

## 2016-05-07 MED ORDER — SODIUM CHLORIDE 0.9 % IV SOLN
Freq: Once | INTRAVENOUS | Status: AC
  Administered 2016-05-07: 16:00:00 via INTRAVENOUS
  Filled 2016-05-07: qty 1000

## 2016-05-07 NOTE — Telephone Encounter (Signed)
New Zometa patient. Estimated CrCl = 42.1. Dose reduced from 4mg  initial dose to 3.3mg .

## 2016-05-07 NOTE — Progress Notes (Signed)
Patient has received the Revlimid and is here to discuss with Dr. Grayland Ormond before starting.

## 2016-05-07 NOTE — Progress Notes (Signed)
[facility]  Telephone:(336) [phone] Fax:(336) [phone]  ID: Rhonda [name] OB: [birth year]  MR#: [id number]  AOZ#:[id number]  Patient Care Team: Lavera Guise, MD as PCP - General (Internal Medicine)  CHIEF COMPLAINT:  Multiple myeloma not in remission  INTERVAL HISTORY: Patient returns to clinic today for further evaluation and initiation of Revlimid and Zometa. She continues to feel well and is asymptomatic. She denies any [name]. She has no neurologic complaints. She denies any recent fevers or illnesses. She has a good appetite and denies weight loss. She denies any nausea, vomiting, constipation, or diarrhea. She has no melena or hematochezia. She has no urinary complaints. Patient offers no specific complaints today.   REVIEW OF SYSTEMS:   Review of Systems  Constitutional: Negative.  Negative for fever, malaise/fatigue and weight loss.  Respiratory: Negative.  Negative for cough and shortness of breath.   Cardiovascular: Negative.  Negative for chest [name] and leg swelling.  Gastrointestinal: Negative.  Negative for abdominal [name], blood in stool and melena.  Genitourinary: Negative.   Musculoskeletal: Negative.   Neurological: Negative.  Negative for weakness.  Psychiatric/Behavioral: Negative.  The patient is not nervous/anxious.     As per HPI. Otherwise, a complete review of systems is negative.  PAST MEDICAL HISTORY: Past Medical History:  Diagnosis Date  . Arthritis    hands  . Cataract   . GERD (gastroesophageal reflux disease)   . Hyperlipemia   . Hypothyroidism   . Multiple myeloma ([place]) 04/18/2016  . Osteoporosis   . Skin cancer    basal cell    PAST SURGICAL HISTORY: Past Surgical History:  Procedure Laterality Date  . BREAST EXCISIONAL BIOPSY Right 15+ yrs ago   EXCISIONAL - NEG  . CATARACT EXTRACTION W/ INTRAOCULAR LENS IMPLANT    . COLONOSCOPY WITH PROPOFOL N/A 02/21/2015   Procedure: COLONOSCOPY WITH PROPOFOL;  Surgeon: Lucilla Lame, MD;  Location: [place];  Service: Endoscopy;  Laterality: N/A;  . [place]  . POLYPECTOMY  02/21/2015   Procedure: POLYPECTOMY;  Surgeon: Lucilla Lame, MD;  Location: [place];  Service: Endoscopy;;    FAMILY HISTORY: Family History  Problem Relation Age of Onset  . Heart disease Mother   . Stroke Father   . Heart disease Brother   . Kidney cancer Neg Hx   . Prostate cancer Neg Hx   . Bladder Cancer Neg Hx   . Breast cancer Neg Hx     ADVANCED DIRECTIVES (Y/N):  N  HEALTH MAINTENANCE: Social History  Substance Use Topics  . Smoking status: Never Smoker  . Smokeless tobacco: Never Used  . Alcohol use No     Colonoscopy:  PAP:  Bone density:  Lipid panel:  No Known Allergies  Current Outpatient Prescriptions  Medication Sig Dispense Refill  . aspirin EC 81 MG tablet Take 81 mg by mouth daily.    . Cyanocobalamin (B-12 PO) Take 1 tablet by mouth daily.    . fexofenadine (ALLEGRA) 30 MG tablet Take 30 mg by mouth 2 (two) times daily.    Marland Kitchen lenalidomide (REVLIMID) 25 MG capsule Take 1 capsule (25 mg total) by mouth daily. For 21 days with 7 days off. 21 capsule 5  . levothyroxine (SYNTHROID, LEVOTHROID) 50 MCG tablet Take 50 mcg by mouth daily before breakfast.     . Multiple Vitamins-Minerals (CENTRUM SILVER PO) Take 1 tablet by mouth daily.    . RESTASIS 0.05 % ophthalmic emulsion 1 drop daily.     Marland Kitchen  triamcinolone (NASACORT ALLERGY 24HR) 55 MCG/ACT AERO nasal inhaler Place 2 sprays into the nose 2 (two) times daily.    Marland Kitchen UNABLE TO FIND Allergy injections once a week     No current facility-administered medications for this visit.     OBJECTIVE: Vitals:   05/07/16 1508  BP: 138/75  Pulse: 83  Resp: 16  Temp: 99.2 F (37.3 C)     Body mass index is 24.03 kg/m.    ECOG FS:0 - Asymptomatic  General: Well-developed, well-nourished, no acute distress. Eyes: Pink conjunctiva, anicteric sclera. Lungs: Clear to  auscultation bilaterally. Heart: Regular rate and rhythm. No rubs, murmurs, or gallops. Abdomen: Soft, nontender, nondistended. No organomegaly noted, normoactive bowel sounds. Musculoskeletal: No edema, cyanosis, or clubbing. Neuro: Alert, answering all questions appropriately. Cranial nerves grossly intact. Skin: No rashes or petechiae noted. Psych: Normal affect.   LAB RESULTS:  Lab Results  Component Value Date   NA 139 05/07/2016   K 4.1 05/07/2016   CL 108 05/07/2016   CO2 24 05/07/2016   GLUCOSE 90 05/07/2016   BUN 17 05/07/2016   CREATININE 0.68 05/07/2016   CALCIUM 9.9 05/07/2016   PROT 6.8 05/07/2016   ALBUMIN 4.4 05/07/2016   AST 19 05/07/2016   ALT 14 05/07/2016   ALKPHOS 104 05/07/2016   BILITOT 0.6 05/07/2016   GFRNONAA >60 05/07/2016   GFRAA >60 05/07/2016    Lab Results  Component Value Date   WBC 5.8 05/07/2016   NEUTROABS 3.5 05/07/2016   HGB 12.2 05/07/2016   HCT 34.8 (L) 05/07/2016   MCV 93.5 05/07/2016   PLT 187 05/07/2016   Lab Results  Component Value Date   TOTALPROTELP 6.4 03/01/2016   ALBUMINELP 4.2 03/01/2016   A1GS 0.2 03/01/2016   A2GS 0.6 03/01/2016   BETS 0.9 03/01/2016   GAMS 0.5 03/01/2016   MSPIKE 0.2 (H) 03/01/2016   SPEI Comment 03/01/2016     STUDIES: Ct Biopsy  Result Date: May 04, 2016 INDICATION: Monoclonal gammopathy of unknown significance, concern for multiple myeloma EXAM: CT GUIDED RIGHT ILIAC BONE MARROW ASPIRATION AND CORE BIOPSY Date:  11-24-201711/24/2017 11:39 am Radiologist:  M. Daryll Brod, MD Guidance:  CT FLUOROSCOPY TIME:  Fluoroscopy Time: None. MEDICATIONS: None. ANESTHESIA/SEDATION: 1.0 mg IV Versed; 50 mcg IV Fentanyl Moderate Sedation Time:  12 minutes The patient was continuously monitored during the procedure by the interventional radiology nurse under my direct supervision. CONTRAST:  None. COMPLICATIONS: None PROCEDURE: Informed consent was obtained from the patient following explanation of the  procedure, risks, benefits and alternatives. The patient understands, agrees and consents for the procedure. All questions were addressed. A time out was performed. The patient was positioned prone and non-contrast localization CT was performed of the pelvis to demonstrate the iliac marrow spaces. Maximal barrier sterile technique utilized including caps, mask, sterile gowns, sterile gloves, large sterile drape, hand hygiene, and Betadine prep. Under sterile conditions and local anesthesia, an 11 gauge coaxial bone biopsy needle was advanced into the right iliac marrow space. Needle position was confirmed with CT imaging. Initially, bone marrow aspiration was performed. Next, the 11 gauge outer cannula was utilized to obtain a right iliac bone marrow core biopsy. Needle was removed. Hemostasis was obtained with compression. The patient tolerated the procedure well. Samples were prepared with the cytotechnologist. No immediate complications. IMPRESSION: CT guided right iliac bone marrow aspiration and core biopsy. Electronically Signed   By: Jerilynn Mages.  Shick M.D.   On: 05-04-2016 11:53    ASSESSMENT: MGUS  PLAN:  1.  MGUS: Patient's calcium levels continue to be mildly elevated, but decreased from previous lab draws. Although patient's M spike is only 0.2, her lambda free chains are significantly elevated at 2183. Bone marrow biopsy at 44% plasma cells with normal cytogenetics. Given the results of her metastatic bone survey, her elevated lambda free chains, and her bone marrow biopsy, patient fits the criteria for multiple myeloma. Given patient's age and no other evidence of endorgan damage, have elected to treat with single agent Revlimid 25 mg daily for 21 days with 7 days off. Patient also will benefit from Zometa every 4 weeks. Given the fact the patient's M spike is only 0.2, will follow lambda light chains. Return to clinic in 2 weeks for laboratory work only and then in 4 weeks for further evaluation and  continuation of Zometa.  2. Hypercalcemia: Resolved. Secondary to underlying myeloma, Zometa as above.  Approximately 30 minutes was spent in discussion of which greater than 50% was consultation.   Patient expressed understanding and was in agreement with this plan. She also understands that She can call clinic at any time with any questions, concerns, or complaints.    Lloyd Huger, MD   05/07/2016 3:13 PM

## 2016-05-08 LAB — PROTEIN ELECTRO, RANDOM URINE
ALBUMIN ELP UR: 16.1 %
ALPHA-1-GLOBULIN, U: 2.2 %
ALPHA-2-GLOBULIN, U: 2.9 %
Beta Globulin, U: 3.3 %
Gamma Globulin, U: 75.6 %
M Component, Ur: 72.3 % — ABNORMAL HIGH
PDF: 0
TOTAL PROTEIN, URINE-UPE24: 119.6 mg/dL

## 2016-05-08 LAB — PROTEIN ELECTROPHORESIS, SERUM
A/G RATIO SPE: 1.8 — AB (ref 0.7–1.7)
ALBUMIN ELP: 3.9 g/dL (ref 2.9–4.4)
Alpha-1-Globulin: 0.2 g/dL (ref 0.0–0.4)
Alpha-2-Globulin: 0.6 g/dL (ref 0.4–1.0)
BETA GLOBULIN: 0.9 g/dL (ref 0.7–1.3)
GLOBULIN, TOTAL: 2.2 g/dL (ref 2.2–3.9)
Gamma Globulin: 0.5 g/dL (ref 0.4–1.8)
M-Spike, %: 0.2 g/dL — ABNORMAL HIGH
TOTAL PROTEIN ELP: 6.1 g/dL (ref 6.0–8.5)

## 2016-05-08 LAB — KAPPA/LAMBDA LIGHT CHAINS
KAPPA, LAMDA LIGHT CHAIN RATIO: 0 — AB (ref 0.26–1.65)
Kappa free light chain: 10.4 mg/L (ref 3.3–19.4)
Lambda free light chains: 2183.5 mg/L — ABNORMAL HIGH (ref 5.7–26.3)

## 2016-05-10 ENCOUNTER — Telehealth: Payer: Self-pay | Admitting: *Deleted

## 2016-05-10 NOTE — Telephone Encounter (Signed)
Called to report that she started her Revlimid Monday and last night she started having itching of the scalp and again this morning she is having itching of the sclap and chills. Please advise.

## 2016-05-10 NOTE — Telephone Encounter (Signed)
Per Dr Grayland Ormond, try taking Rhonda [name] and see how she does. If no improvement will have to stop Revlimid.  Patient advised of MD recommendation and will try Rhonda [name]. I asked that she call me back tomorrow to see hoe she is doing

## 2016-05-11 ENCOUNTER — Telehealth: Payer: Self-pay | Admitting: *Deleted

## 2016-05-11 NOTE — Telephone Encounter (Signed)
Stop revlimid for the weekend, continue benadryl and call Monday or Tuesday with how she is doing.

## 2016-05-11 NOTE — Telephone Encounter (Signed)
Called to report that she is taking Benadryl q 6 h and that the rash on her hands, fee,t and legs is a little better , but there is still a lot of redness around her hair line. She had not mentioned a rash yesterday, only itching and chills. She reports that the itching is still present, but not as bad with the benadryl. Please advise

## 2016-05-11 NOTE — Telephone Encounter (Signed)
Patient notified to stop Revlimid and continue Benadryl over weekend and she states she will call me back on Monday or Tuesday.

## 2016-05-14 ENCOUNTER — Telehealth: Payer: Self-pay | Admitting: *Deleted

## 2016-05-14 ENCOUNTER — Other Ambulatory Visit: Payer: Self-pay | Admitting: Oncology

## 2016-05-14 DIAGNOSIS — C9000 Multiple myeloma not having achieved remission: Secondary | ICD-10-CM

## 2016-05-14 MED ORDER — LENALIDOMIDE 15 MG PO CAPS: 15.0000 mg | ORAL_CAPSULE | Freq: Every day | ORAL | 5 refills | Status: DC

## 2016-05-14 NOTE — Telephone Encounter (Signed)
Called back to report that she is all clear and is asking what she is to do.

## 2016-05-14 NOTE — Telephone Encounter (Signed)
Her rash was likely from Revlimid. Will have to dose reduce.

## 2016-05-14 NOTE — Telephone Encounter (Signed)
Called patiet to advise her that a new rx is being sent to pharmacy and it will be sent to her in the mail for a smaller dose

## 2016-05-21 ENCOUNTER — Inpatient Hospital Stay: Payer: Medicare Other | Attending: Oncology

## 2016-05-21 DIAGNOSIS — Z7982 Long term (current) use of aspirin: Secondary | ICD-10-CM | POA: Diagnosis not present

## 2016-05-21 DIAGNOSIS — Z85828 Personal history of other malignant neoplasm of skin: Secondary | ICD-10-CM | POA: Insufficient documentation

## 2016-05-21 DIAGNOSIS — Z79899 Other long term (current) drug therapy: Secondary | ICD-10-CM | POA: Diagnosis not present

## 2016-05-21 DIAGNOSIS — E039 Hypothyroidism, unspecified: Secondary | ICD-10-CM | POA: Diagnosis not present

## 2016-05-21 DIAGNOSIS — D472 Monoclonal gammopathy: Secondary | ICD-10-CM | POA: Insufficient documentation

## 2016-05-21 DIAGNOSIS — K219 Gastro-esophageal reflux disease without esophagitis: Secondary | ICD-10-CM | POA: Insufficient documentation

## 2016-05-21 DIAGNOSIS — C9000 Multiple myeloma not having achieved remission: Secondary | ICD-10-CM | POA: Diagnosis present

## 2016-05-21 DIAGNOSIS — M1990 Unspecified osteoarthritis, unspecified site: Secondary | ICD-10-CM | POA: Diagnosis not present

## 2016-05-21 DIAGNOSIS — E785 Hyperlipidemia, unspecified: Secondary | ICD-10-CM | POA: Diagnosis not present

## 2016-05-21 DIAGNOSIS — M818 Other osteoporosis without current pathological fracture: Secondary | ICD-10-CM | POA: Insufficient documentation

## 2016-05-21 LAB — CBC WITH DIFFERENTIAL/PLATELET
BASOS PCT: 1 %
Basophils Absolute: 0.1 10*3/uL (ref 0–0.1)
Eosinophils Absolute: 0.1 10*3/uL (ref 0–0.7)
Eosinophils Relative: 1 %
HEMATOCRIT: 34 % — AB (ref 35.0–47.0)
HEMOGLOBIN: 11.9 g/dL — AB (ref 12.0–16.0)
LYMPHS ABS: 1 10*3/uL (ref 1.0–3.6)
Lymphocytes Relative: 15 %
MCH: 33 pg (ref 26.0–34.0)
MCHC: 34.9 g/dL (ref 32.0–36.0)
MCV: 94.5 fL (ref 80.0–100.0)
MONO ABS: 1.5 10*3/uL — AB (ref 0.2–0.9)
MONOS PCT: 22 %
NEUTROS ABS: 4.2 10*3/uL (ref 1.4–6.5)
NEUTROS PCT: 61 %
Platelets: 189 10*3/uL (ref 150–440)
RBC: 3.6 MIL/uL — ABNORMAL LOW (ref 3.80–5.20)
RDW: 12.8 % (ref 11.5–14.5)
WBC: 6.8 10*3/uL (ref 3.6–11.0)

## 2016-05-21 LAB — BASIC METABOLIC PANEL
ANION GAP: 6 (ref 5–15)
BUN: 18 mg/dL (ref 6–20)
CALCIUM: 8.7 mg/dL — AB (ref 8.9–10.3)
CHLORIDE: 105 mmol/L (ref 101–111)
CO2: 25 mmol/L (ref 22–32)
Creatinine, Ser: 0.75 mg/dL (ref 0.44–1.00)
GFR calc non Af Amer: >60 mL/min (ref >60)
GLUCOSE: 90 mg/dL (ref 65–99)
Potassium: 3.9 mmol/L (ref 3.5–5.1)
Sodium: 136 mmol/L (ref 135–145)

## 2016-05-22 LAB — IGG, IGA, IGM
IGG (IMMUNOGLOBIN G), SERUM: 476 mg/dL — AB (ref 700–1600)
IGM, SERUM: 19 mg/dL — AB (ref 26–217)
IgA: 26 mg/dL — ABNORMAL LOW (ref 64–422)

## 2016-05-23 LAB — KAPPA/LAMBDA LIGHT CHAINS
KAPPA, LAMDA LIGHT CHAIN RATIO: 0.01 — AB (ref 0.26–1.65)
Kappa free light chain: 13.1 mg/L (ref 3.3–19.4)
Lambda free light chains: 1357.9 mg/L — ABNORMAL HIGH (ref 5.7–26.3)

## 2016-05-24 LAB — PROTEIN ELECTROPHORESIS, SERUM
A/G RATIO SPE: 1.7 (ref 0.7–1.7)
Albumin ELP: 3.8 g/dL (ref 2.9–4.4)
Alpha-1-Globulin: 0.2 g/dL (ref 0.0–0.4)
Alpha-2-Globulin: 0.7 g/dL (ref 0.4–1.0)
BETA GLOBULIN: 0.8 g/dL (ref 0.7–1.3)
Gamma Globulin: 0.5 g/dL (ref 0.4–1.8)
Globulin, Total: 2.2 g/dL (ref 2.2–3.9)
M-Spike, %: 0.2 g/dL — ABNORMAL HIGH
TOTAL PROTEIN ELP: 6 g/dL (ref 6.0–8.5)

## 2016-06-06 NOTE — Progress Notes (Signed)
[facility]  Telephone:(336) [phone] Fax:(336) [phone]  ID: [name] OB: 1933-12-28  MR#: [id number]  LKJ#:[id number]  Patient Care Team: Lavera Guise, MD as PCP - General (Internal Medicine)  CHIEF COMPLAINT:  Multiple myeloma not having achieved remission.  INTERVAL HISTORY: Patient returns to clinic today for further evaluation and follow-up. Currently taking dose reduced Revlimid and Zometa. She continues to feel well and is asymptomatic. She denies any pain. She has no neurologic complaints. She denies any recent fevers or illnesses. She has a good appetite and denies weight loss. She denies any nausea, vomiting, constipation, or diarrhea. She has no melena or hematochezia. She has no urinary complaints. Patient offers no specific complaints today.   REVIEW OF SYSTEMS:   Review of Systems  Constitutional: Negative.  Negative for fever, malaise/fatigue and weight loss.  Respiratory: Negative.  Negative for cough and shortness of breath.   Cardiovascular: Negative.  Negative for chest pain and leg swelling.  Gastrointestinal: Negative.  Negative for abdominal pain, blood in stool and melena.  Genitourinary: Negative.   Musculoskeletal: Negative.   Neurological: Negative.  Negative for weakness.  Psychiatric/Behavioral: Negative.  The patient is not nervous/anxious.     As per HPI. Otherwise, a complete review of systems is negative.  PAST MEDICAL HISTORY: Past Medical History:  Diagnosis Date  . Arthritis    hands  . Cataract   . GERD (gastroesophageal reflux disease)   . Hyperlipemia   . Hypothyroidism   . Multiple myeloma ([place]) 04/18/2016  . Osteoporosis   . Skin cancer    basal cell    PAST SURGICAL HISTORY: Past Surgical History:  Procedure Laterality Date  . BREAST EXCISIONAL BIOPSY Right 15+ yrs ago   EXCISIONAL - NEG  . CATARACT EXTRACTION W/ INTRAOCULAR LENS IMPLANT    . COLONOSCOPY WITH PROPOFOL N/A 02/21/2015   Procedure:  COLONOSCOPY WITH PROPOFOL;  Surgeon: Lucilla Lame, MD;  Location: [place];  Service: Endoscopy;  Laterality: N/A;  . [place]  . POLYPECTOMY  02/21/2015   Procedure: POLYPECTOMY;  Surgeon: Lucilla Lame, MD;  Location: [facility];  Service: Endoscopy;;    FAMILY HISTORY: Family History  Problem Relation Age of Onset  . Heart disease Mother   . Stroke Father   . Heart disease Brother   . Kidney cancer Neg Hx   . Prostate cancer Neg Hx   . Bladder Cancer Neg Hx   . Breast cancer Neg Hx     ADVANCED DIRECTIVES (Y/N):  N  HEALTH MAINTENANCE: Social History  Substance Use Topics  . Smoking status: Never Smoker  . Smokeless tobacco: Never Used  . Alcohol use No     Colonoscopy:  PAP:  Bone density:  Lipid panel:  No Known Allergies  Current Outpatient Prescriptions  Medication Sig Dispense Refill  . aspirin EC 81 MG tablet Take 81 mg by mouth daily.    . Cyanocobalamin (B-12 PO) Take 1 tablet by mouth daily.    . fexofenadine (ALLEGRA) 30 MG tablet Take 30 mg by mouth 2 (two) times daily.    Marland Kitchen lenalidomide (REVLIMID) 15 MG capsule Take 1 capsule (15 mg total) by mouth daily. For 21 days with 7 days off. 21 capsule 5  . levothyroxine (SYNTHROID, LEVOTHROID) 50 MCG tablet Take 50 mcg by mouth daily before breakfast.     . Multiple Vitamins-Minerals (CENTRUM SILVER PO) Take 1 tablet by mouth daily.    . RESTASIS 0.05 % ophthalmic emulsion  1 drop daily.     Marland Kitchen triamcinolone (NASACORT ALLERGY 24HR) 55 MCG/ACT AERO nasal inhaler Place 2 sprays into the nose 2 (two) times daily.    Marland Kitchen UNABLE TO FIND Allergy injections once a week     No current facility-administered medications for this visit.     OBJECTIVE: Vitals:   06/07/16 1355  BP: (!) 148/70  Pulse: 69  Resp: 18  Temp: 98.3 F (36.8 C)     Body mass index is 24.31 kg/m.    ECOG FS:0 - Asymptomatic  General: Well-developed, well-nourished, no acute distress. Eyes: Pink  conjunctiva, anicteric sclera. Lungs: Clear to auscultation bilaterally. Heart: Regular rate and rhythm. No rubs, murmurs, or gallops. Abdomen: Soft, nontender, nondistended. No organomegaly noted, normoactive bowel sounds. Musculoskeletal: No edema, cyanosis, or clubbing. Neuro: Alert, answering all questions appropriately. Cranial nerves grossly intact. Skin: No rashes or petechiae noted. Psych: Normal affect.   LAB RESULTS:  Lab Results  Component Value Date   NA 135 06/07/2016   K 3.9 06/07/2016   CL 106 06/07/2016   CO2 24 06/07/2016   GLUCOSE 86 06/07/2016   BUN 21 (H) 06/07/2016   CREATININE 0.72 06/07/2016   CALCIUM 8.2 (L) 06/07/2016   PROT 6.8 05/07/2016   ALBUMIN 4.4 05/07/2016   AST 19 05/07/2016   ALT 14 05/07/2016   ALKPHOS 104 05/07/2016   BILITOT 0.6 05/07/2016   GFRNONAA >60 06/07/2016   GFRAA >60 06/07/2016    Lab Results  Component Value Date   WBC 4.4 06/07/2016   NEUTROABS 2.5 06/07/2016   HGB 11.5 (L) 06/07/2016   HCT 32.9 (L) 06/07/2016   MCV 94.8 06/07/2016   PLT 223 06/07/2016   Lab Results  Component Value Date   TOTALPROTELP 6.0 05/21/2016   ALBUMINELP 3.8 05/21/2016   A1GS 0.2 05/21/2016   A2GS 0.7 05/21/2016   BETS 0.8 05/21/2016   GAMS 0.5 05/21/2016   MSPIKE 0.2 (H) 05/21/2016   SPEI Comment 05/21/2016     STUDIES: No results found.  ASSESSMENT: Multiple myeloma not having achieve remission  PLAN:    1. Multiple myeloma not having achieve remission: Patient's calcium levels continue to trend down and are 8.2 today. M spike is 0.1, and lambda free chains are trending down from 2183.5 to 1357. Today's result is pending. Bone marrow biopsy at 44% plasma cells with normal cytogenetics. Given the results of her metastatic bone survey, her elevated lambda free chains, and her bone marrow biopsy, patient fits the criteria for multiple myeloma.  Continue to treat with single agent Revlimid 25 mg daily for 21 days with 7 days off.  Continue Zometa every 4 weeks. Given the fact the patient's M spike is only 0.1, will follow lambda light chains. Return to clinic in 4 weeks for labs, evaluation and continuation of Zometa.  2. Hypercalcemia: Resolved. Secondary to underlying myeloma, Zometa as above. Calcium is 8.2 today. Monitor.  Approximately 30 minutes was spent in discussion of which greater than 50% was consultation.   Patient expressed understanding and was in agreement with this plan. She also understands that She can call clinic at any time with any questions, concerns, or complaints.    Jacquelin Hawking, NP   06/07/2016 3:53 PM   Patient was seen and evaluated independently and I agree with the assessment and plan as dictated above. Continue with dose reduce Revlimid at 15 mg daily for 21 days with 7 days off. Patient should also be taking baby aspirin daily.  Lloyd Huger, MD 06/08/16 4:02 PM

## 2016-06-07 ENCOUNTER — Inpatient Hospital Stay: Payer: Medicare Other

## 2016-06-07 ENCOUNTER — Other Ambulatory Visit: Payer: Self-pay

## 2016-06-07 ENCOUNTER — Inpatient Hospital Stay (HOSPITAL_BASED_OUTPATIENT_CLINIC_OR_DEPARTMENT_OTHER): Payer: Medicare Other | Admitting: Oncology

## 2016-06-07 VITALS — BP 148/70 | HR 69 | Temp 98.3°F | Resp 18 | Wt 132.9 lb

## 2016-06-07 DIAGNOSIS — C9000 Multiple myeloma not having achieved remission: Secondary | ICD-10-CM

## 2016-06-07 DIAGNOSIS — D472 Monoclonal gammopathy: Secondary | ICD-10-CM

## 2016-06-07 DIAGNOSIS — Z79899 Other long term (current) drug therapy: Secondary | ICD-10-CM | POA: Diagnosis not present

## 2016-06-07 LAB — BASIC METABOLIC PANEL
Anion gap: 5 (ref 5–15)
BUN: 21 mg/dL — AB (ref 6–20)
CHLORIDE: 106 mmol/L (ref 101–111)
CO2: 24 mmol/L (ref 22–32)
CREATININE: 0.72 mg/dL (ref 0.44–1.00)
Calcium: 8.2 mg/dL — ABNORMAL LOW (ref 8.9–10.3)
GFR calc Af Amer: >60 mL/min (ref >60)
GFR calc non Af Amer: >60 mL/min (ref >60)
GLUCOSE: 86 mg/dL (ref 65–99)
Potassium: 3.9 mmol/L (ref 3.5–5.1)
Sodium: 135 mmol/L (ref 135–145)

## 2016-06-07 LAB — CBC WITH DIFFERENTIAL/PLATELET
Basophils Absolute: 0.1 10*3/uL (ref 0–0.1)
Basophils Relative: 1 %
EOS ABS: 0.1 10*3/uL (ref 0–0.7)
EOS PCT: 3 %
HCT: 32.9 % — ABNORMAL LOW (ref 35.0–47.0)
HEMOGLOBIN: 11.5 g/dL — AB (ref 12.0–16.0)
LYMPHS ABS: 0.9 10*3/uL — AB (ref 1.0–3.6)
Lymphocytes Relative: 20 %
MCH: 33 pg (ref 26.0–34.0)
MCHC: 34.8 g/dL (ref 32.0–36.0)
MCV: 94.8 fL (ref 80.0–100.0)
MONOS PCT: 19 %
Monocytes Absolute: 0.8 10*3/uL (ref 0.2–0.9)
NEUTROS PCT: 57 %
Neutro Abs: 2.5 10*3/uL (ref 1.4–6.5)
Platelets: 223 10*3/uL (ref 150–440)
RBC: 3.47 MIL/uL — ABNORMAL LOW (ref 3.80–5.20)
RDW: 12.9 % (ref 11.5–14.5)
WBC: 4.4 10*3/uL (ref 3.6–11.0)

## 2016-06-07 MED ORDER — ZOLEDRONIC ACID 4 MG/100ML IV SOLN: 4.0000 mg | Freq: Once | INTRAVENOUS | Status: DC

## 2016-06-07 MED ORDER — SODIUM CHLORIDE 0.9 % IV SOLN
Freq: Once | INTRAVENOUS | Status: AC
  Administered 2016-06-07: 15:00:00 via INTRAVENOUS
  Filled 2016-06-07: qty 1000

## 2016-06-07 MED ORDER — ZOLEDRONIC ACID 4 MG/5ML IV CONC
3.3000 mg | Freq: Once | INTRAVENOUS | Status: AC
  Administered 2016-06-07: 3.3 mg via INTRAVENOUS
  Filled 2016-06-07: qty 4.13

## 2016-06-07 NOTE — Progress Notes (Signed)
Offers no complaints. States is feeling well. States understanding to take revlimid 15mg  daily x 21 days then 7 days. Tolerating oral chemo well without side effects. Has not missed any doses.

## 2016-06-08 LAB — PROTEIN ELECTROPHORESIS, SERUM
A/G RATIO SPE: 1.5 (ref 0.7–1.7)
Albumin ELP: 3.4 g/dL (ref 2.9–4.4)
Alpha-1-Globulin: 0.2 g/dL (ref 0.0–0.4)
Alpha-2-Globulin: 0.7 g/dL (ref 0.4–1.0)
BETA GLOBULIN: 0.8 g/dL (ref 0.7–1.3)
Gamma Globulin: 0.6 g/dL (ref 0.4–1.8)
Globulin, Total: 2.3 g/dL (ref 2.2–3.9)
M-Spike, %: 0.1 g/dL — ABNORMAL HIGH
PDF SPE: 0
TOTAL PROTEIN ELP: 5.7 g/dL — AB (ref 6.0–8.5)

## 2016-06-08 LAB — IGG, IGA, IGM
IGM, SERUM: 25 mg/dL — AB (ref 26–217)
IgA: 60 mg/dL — ABNORMAL LOW (ref 64–422)
IgG (Immunoglobin G), Serum: 530 mg/dL — ABNORMAL LOW (ref 700–1600)

## 2016-06-12 LAB — KAPPA/LAMBDA LIGHT CHAINS
Kappa free light chain: 16.7 mg/L (ref 3.3–19.4)
Kappa, lambda light chain ratio: 0.02 — ABNORMAL LOW (ref 0.26–1.65)
Lambda free light chains: 1034.5 mg/L — ABNORMAL HIGH (ref 5.7–26.3)

## 2016-06-14 ENCOUNTER — Other Ambulatory Visit: Payer: Self-pay | Admitting: *Deleted

## 2016-06-14 DIAGNOSIS — C9000 Multiple myeloma not having achieved remission: Secondary | ICD-10-CM

## 2016-06-14 MED ORDER — LENALIDOMIDE 15 MG PO CAPS: 15.0000 mg | ORAL_CAPSULE | Freq: Every day | ORAL | 0 refills | Status: DC

## 2016-07-08 NOTE — Progress Notes (Signed)
[place]  Telephone:(336) [phone] Fax:(336) [phone]  ID: Rhonda [name] OB: [birth year]  MR#: [id number]  OZY#:[id number]  Patient Care Team: Lavera Guise, MD as PCP - General (Internal Medicine)  CHIEF COMPLAINT:  Multiple myeloma not having achieved remission.  INTERVAL HISTORY: Patient returns to clinic today for further evaluation and follow-up. Currently taking dose reduced Revlimid and Zometa.  She continues to feel well.  She did report some intermittent nausea last week, Tuesday to Saturday, lasting 2 hours at a time, self-resolving.  She reports feeling a bit stuffy in her head and chest, with a dry hacking cough, relieved with daily allegra and occasional allergy shots, managed by her PCP.  She also reports a chronic scratchy throat since beginning chemo.  She complains of swelling in her feet at the end of the day.  She denies any chest [name] or shortness of breath.  She reports tingling in all her fingertips and toes.  She also has intermittent painful cramps in her hands and feet, moderately relieved with pickle juice. She denies any recent fevers or illnesses. She has a good appetite and denies weight loss. She denies any vomiting, constipation, or diarrhea.  She has no hematuria, melena, or hematochezia. She has no urinary complaints.  Patient offers no further specific complaints today.   REVIEW OF SYSTEMS:   Review of Systems  Constitutional: Negative.  Negative for fever, malaise/fatigue and weight loss.  HENT: Positive for congestion and sore throat.   Respiratory: Positive for cough. Negative for shortness of breath.   Cardiovascular: Positive for leg swelling. Negative for chest [name] and palpitations.  Gastrointestinal: Positive for nausea. Negative for abdominal [name], blood in stool, constipation, diarrhea, melena and vomiting.  Genitourinary: Negative.   Musculoskeletal: Negative.   Neurological: Positive for tingling. Negative for dizziness,  weakness and headaches.  Psychiatric/Behavioral: Negative.  The patient is not nervous/anxious and does not have insomnia.     As per HPI. Otherwise, a complete review of systems is negative.  PAST MEDICAL HISTORY: Past Medical History:  Diagnosis Date  . Arthritis    hands  . Cataract   . GERD (gastroesophageal reflux disease)   . Hyperlipemia   . Hypothyroidism   . Multiple myeloma ([place]) 04/18/2016  . Osteoporosis   . Skin cancer    basal cell    PAST SURGICAL HISTORY: Past Surgical History:  Procedure Laterality Date  . BREAST EXCISIONAL BIOPSY Right 15+ yrs ago   EXCISIONAL - NEG  . CATARACT EXTRACTION W/ INTRAOCULAR LENS IMPLANT    . COLONOSCOPY WITH PROPOFOL N/A 02/21/2015   Procedure: COLONOSCOPY WITH PROPOFOL;  Surgeon: Lucilla Lame, MD;  Location: [place];  Service: Endoscopy;  Laterality: N/A;  . [place]  . POLYPECTOMY  02/21/2015   Procedure: POLYPECTOMY;  Surgeon: Lucilla Lame, MD;  Location: [place];  Service: Endoscopy;;    FAMILY HISTORY: Family History  Problem Relation Age of Onset  . Heart disease Mother   . Stroke Father   . Heart disease Brother   . Kidney cancer Neg Hx   . Prostate cancer Neg Hx   . Bladder Cancer Neg Hx   . Breast cancer Neg Hx     ADVANCED DIRECTIVES (Y/N):  N  HEALTH MAINTENANCE: Social History  Substance Use Topics  . Smoking status: Never Smoker  . Smokeless tobacco: Never Used  . Alcohol use No     Colonoscopy:  PAP:  Bone density:  Lipid panel:  No  Known Allergies  Current Outpatient Prescriptions  Medication Sig Dispense Refill  . aspirin EC 81 MG tablet Take 81 mg by mouth daily.    . Cyanocobalamin (B-12 PO) Take 1 tablet by mouth daily.    . fexofenadine (ALLEGRA) 30 MG tablet Take 30 mg by mouth 2 (two) times daily.    Marland Kitchen lenalidomide (REVLIMID) 15 MG capsule Take 1 capsule (15 mg total) by mouth daily. For 21 days with 7 days off. 21 capsule 0  .  levothyroxine (SYNTHROID, LEVOTHROID) 50 MCG tablet Take 50 mcg by mouth daily before breakfast.     . Multiple Vitamins-Minerals (CENTRUM SILVER PO) Take 1 tablet by mouth daily.    . RESTASIS 0.05 % ophthalmic emulsion 1 drop daily.     Marland Kitchen triamcinolone (NASACORT ALLERGY 24HR) 55 MCG/ACT AERO nasal inhaler Place 2 sprays into the nose 2 (two) times daily.    Marland Kitchen UNABLE TO FIND Allergy injections once a week     No current facility-administered medications for this visit.     OBJECTIVE: Vitals:   07/09/16 1428  BP: 132/76  Pulse: 75  Resp: 18  Temp: 99.4 F (37.4 C)     Body mass index is 23.99 kg/m.    ECOG FS:0 - Asymptomatic  General: Well-developed, well-nourished, no acute distress. Eyes: Pink conjunctiva, anicteric sclera. Lungs: Clear to auscultation bilaterally. Heart: Regular rate and rhythm. No rubs, murmurs, or gallops. Abdomen: Soft, nontender, nondistended. No organomegaly noted, normoactive bowel sounds. Musculoskeletal: No edema, cyanosis, or clubbing. Neuro: Alert, answering all questions appropriately. Cranial nerves grossly intact. Skin: No rashes or petechiae noted. Psych: Normal affect.   LAB RESULTS:  Lab Results  Component Value Date   NA 136 07/09/2016   K 3.9 07/09/2016   CL 104 07/09/2016   CO2 27 07/09/2016   GLUCOSE 102 (H) 07/09/2016   BUN 21 (H) 07/09/2016   CREATININE 0.66 07/09/2016   CALCIUM 9.2 07/09/2016   PROT 6.8 05/07/2016   ALBUMIN 4.4 05/07/2016   AST 19 05/07/2016   ALT 14 05/07/2016   ALKPHOS 104 05/07/2016   BILITOT 0.6 05/07/2016   GFRNONAA >60 07/09/2016   GFRAA >60 07/09/2016    Lab Results  Component Value Date   WBC 4.7 07/09/2016   NEUTROABS 3.0 07/09/2016   HGB 12.9 07/09/2016   HCT 36.7 07/09/2016   MCV 93.8 07/09/2016   PLT 115 (L) 07/09/2016   Lab Results  Component Value Date   TOTALPROTELP 5.9 (L) 07/09/2016   ALBUMINELP 3.7 07/09/2016   A1GS 0.2 07/09/2016   A2GS 0.6 07/09/2016   BETS 0.8  07/09/2016   GAMS 0.6 07/09/2016   MSPIKE Not Observed 07/09/2016   SPEI Comment 07/09/2016     STUDIES: No results found.  ASSESSMENT: Multiple myeloma not having achieve remission  PLAN:    1. Multiple myeloma not having achieve remission: Patient's calcium levels had been trending down previously, but are within normal limits today.   Labs on June 07, 2016 showed M spike at 0.1,  and lambda free chains trending down from 1357 to 1034. Today's results are pending. Bone marrow biopsy at 44% plasma cells with normal cytogenetics. Given the results of her metastatic bone survey, her elevated lambda free chains, and her bone marrow biopsy, patient fits the criteria for multiple myeloma.  Continue to treat with single agent Revlimid 25 mg daily for 21 days with 7 days off. Continue Zometa every 4 weeks. Given the fact the patient's M spike is only 0.1,  will follow lambda light chains. Return to clinic in 4 weeks for labs, evaluation and continuation of Zometa.  2. Hypercalcemia: Resolved. Secondary to underlying myeloma, Zometa as above. Calcium is 9.2 today. Monitor. 3. Hand/feet cramps: Trial spoonful of yellow mustard at first sign of cramping.  Monitor.  Patient expressed understanding and was in agreement with this plan. She also understands that She can call clinic at any time with any questions, concerns, or complaints.   Lucendia Herrlich, NP  07/12/16 5:59 PM   Patient was seen and evaluated independently and I agree with the assessment and plan as dictated above. Patient's M spike is not observed as of July 09, 2016. Her kappa free light chains continue to trend down, but are still elevated at 853.7. Can consider one or 2 more cycles of 25 mg of Revlimid then consider dose reducing to maintenance of 10 mg daily.  Lloyd Huger, MD 07/12/16 6:00 PM

## 2016-07-09 ENCOUNTER — Inpatient Hospital Stay: Payer: Medicare Other | Attending: Oncology

## 2016-07-09 ENCOUNTER — Inpatient Hospital Stay (HOSPITAL_BASED_OUTPATIENT_CLINIC_OR_DEPARTMENT_OTHER): Payer: Medicare Other | Admitting: Oncology

## 2016-07-09 ENCOUNTER — Inpatient Hospital Stay: Payer: Medicare Other

## 2016-07-09 VITALS — BP 132/76 | HR 75 | Temp 99.4°F | Resp 18 | Wt 131.2 lb

## 2016-07-09 DIAGNOSIS — Z79899 Other long term (current) drug therapy: Secondary | ICD-10-CM | POA: Insufficient documentation

## 2016-07-09 DIAGNOSIS — C9000 Multiple myeloma not having achieved remission: Secondary | ICD-10-CM

## 2016-07-09 DIAGNOSIS — E039 Hypothyroidism, unspecified: Secondary | ICD-10-CM | POA: Diagnosis not present

## 2016-07-09 DIAGNOSIS — K219 Gastro-esophageal reflux disease without esophagitis: Secondary | ICD-10-CM | POA: Diagnosis not present

## 2016-07-09 DIAGNOSIS — Z85828 Personal history of other malignant neoplasm of skin: Secondary | ICD-10-CM | POA: Insufficient documentation

## 2016-07-09 DIAGNOSIS — E785 Hyperlipidemia, unspecified: Secondary | ICD-10-CM | POA: Diagnosis not present

## 2016-07-09 DIAGNOSIS — Z7982 Long term (current) use of aspirin: Secondary | ICD-10-CM | POA: Diagnosis not present

## 2016-07-09 LAB — CBC WITH DIFFERENTIAL/PLATELET
Basophils Absolute: 0.1 10*3/uL (ref 0–0.1)
Basophils Relative: 2 %
Eosinophils Absolute: 0.2 10*3/uL (ref 0–0.7)
Eosinophils Relative: 5 %
HCT: 36.7 % (ref 35.0–47.0)
HEMOGLOBIN: 12.9 g/dL (ref 12.0–16.0)
LYMPHS ABS: 0.9 10*3/uL — AB (ref 1.0–3.6)
LYMPHS PCT: 18 %
MCH: 33 pg (ref 26.0–34.0)
MCHC: 35.2 g/dL (ref 32.0–36.0)
MCV: 93.8 fL (ref 80.0–100.0)
MONOS PCT: 12 %
Monocytes Absolute: 0.6 10*3/uL (ref 0.2–0.9)
NEUTROS PCT: 63 %
Neutro Abs: 3 10*3/uL (ref 1.4–6.5)
Platelets: 115 10*3/uL — ABNORMAL LOW (ref 150–440)
RBC: 3.91 MIL/uL (ref 3.80–5.20)
RDW: 13 % (ref 11.5–14.5)
WBC: 4.7 10*3/uL (ref 3.6–11.0)

## 2016-07-09 LAB — BASIC METABOLIC PANEL
Anion gap: 5 (ref 5–15)
BUN: 21 mg/dL — AB (ref 6–20)
CO2: 27 mmol/L (ref 22–32)
Calcium: 9.2 mg/dL (ref 8.9–10.3)
Chloride: 104 mmol/L (ref 101–111)
Creatinine, Ser: 0.66 mg/dL (ref 0.44–1.00)
GFR calc Af Amer: >60 mL/min (ref >60)
GFR calc non Af Amer: >60 mL/min (ref >60)
Glucose, Bld: 102 mg/dL — ABNORMAL HIGH (ref 65–99)
POTASSIUM: 3.9 mmol/L (ref 3.5–5.1)
SODIUM: 136 mmol/L (ref 135–145)

## 2016-07-09 MED ORDER — ZOLEDRONIC ACID 4 MG/5ML IV CONC
3.3000 mg | Freq: Once | INTRAVENOUS | Status: AC
  Administered 2016-07-09: 3.3 mg via INTRAVENOUS
  Filled 2016-07-09: qty 4.13

## 2016-07-09 MED ORDER — SODIUM CHLORIDE 0.9 % IV SOLN
Freq: Once | INTRAVENOUS | Status: AC
  Administered 2016-07-09: 16:00:00 via INTRAVENOUS
  Filled 2016-07-09: qty 1000

## 2016-07-09 NOTE — Progress Notes (Signed)
Patient will finish her second cycle of Revlimid tonight.  States she did experience some nausea last week.  She had never had nausea before.

## 2016-07-10 LAB — IGG, IGA, IGM
IGG (IMMUNOGLOBIN G), SERUM: 547 mg/dL — AB (ref 700–1600)
IgA: 51 mg/dL — ABNORMAL LOW (ref 64–422)
IgM, Serum: 27 mg/dL (ref 26–217)

## 2016-07-10 LAB — KAPPA/LAMBDA LIGHT CHAINS
Kappa free light chain: 17.3 mg/L (ref 3.3–19.4)
Kappa, lambda light chain ratio: 0.02 — ABNORMAL LOW (ref 0.26–1.65)
Lambda free light chains: 853.7 mg/L — ABNORMAL HIGH (ref 5.7–26.3)

## 2016-07-10 LAB — PROTEIN ELECTROPHORESIS, SERUM
A/G RATIO SPE: 1.7 (ref 0.7–1.7)
ALBUMIN ELP: 3.7 g/dL (ref 2.9–4.4)
ALPHA-2-GLOBULIN: 0.6 g/dL (ref 0.4–1.0)
Alpha-1-Globulin: 0.2 g/dL (ref 0.0–0.4)
Beta Globulin: 0.8 g/dL (ref 0.7–1.3)
Gamma Globulin: 0.6 g/dL (ref 0.4–1.8)
Globulin, Total: 2.2 g/dL (ref 2.2–3.9)
Total Protein ELP: 5.9 g/dL — ABNORMAL LOW (ref 6.0–8.5)

## 2016-07-26 ENCOUNTER — Telehealth: Payer: Self-pay | Admitting: *Deleted

## 2016-07-26 MED ORDER — PROCHLORPERAZINE MALEATE 10 MG PO TABS: 10.0000 mg | ORAL_TABLET | Freq: Four times a day (QID) | ORAL | 0 refills | Status: DC | PRN

## 2016-07-26 NOTE — Telephone Encounter (Signed)
Called too report that she has been having nausea without vomiting for the past week and OTC meds are not helping. Asking if Dr Grayland Ormond will order her some nausea medicine. Please advise

## 2016-07-26 NOTE — Telephone Encounter (Signed)
Compazine 10 mg every 6 hours as needed #30. Patient informed of Rx being sent to [facility] per her request

## 2016-08-06 NOTE — Progress Notes (Signed)
Rhonda [name]  [phone] Fax:(336) [phone]  ID: Rhonda [name] OB: [birth year]  MR#: [id number]  XTA#:[id number]  Patient Care Team: Lavera Guise, MD as PCP - General (Internal Medicine)  CHIEF COMPLAINT:  Multiple myeloma not having achieved remission.  INTERVAL HISTORY: Patient returns to clinic today for further evaluation and follow-up. She continues to tolerate dose reduced Revlimid without significant side effects. She currently feels well and is asymptomatic. She has no neurologic complaints. She denies any recent fevers or illnesses. She has a good appetite and denies weight loss. She denies any chest [name] or shortness of breath. She denies any nausea, vomiting, constipation, or diarrhea.  She has no hematuria, melena, or hematochezia. She has no urinary complaints.  Patient offers no specific complaints today.   REVIEW OF SYSTEMS:   Review of Systems  Constitutional: Negative.  Negative for fever, malaise/fatigue and weight loss.  HENT: Negative for congestion and sore throat.   Respiratory: Negative for cough and shortness of breath.   Cardiovascular: Negative for chest [name], palpitations and leg swelling.  Gastrointestinal: Negative for abdominal [name], blood in stool, constipation, diarrhea, melena, nausea and vomiting.  Genitourinary: Negative.   Musculoskeletal: Negative.   Neurological: Negative for dizziness, tingling, weakness and headaches.  Psychiatric/Behavioral: Negative.  The patient is not nervous/anxious and does not have insomnia.     As per HPI. Otherwise, a complete review of systems is negative.  PAST MEDICAL HISTORY: Past Medical History:  Diagnosis Date  . Arthritis    hands  . Cataract   . GERD (gastroesophageal reflux disease)   . Hyperlipemia   . Hypothyroidism   . Multiple myeloma ([place]) 04/18/2016  . Osteoporosis   . Skin cancer    basal cell    PAST SURGICAL HISTORY: Past Surgical History:  Procedure  Laterality Date  . BREAST EXCISIONAL BIOPSY Right 15+ yrs ago   EXCISIONAL - NEG  . CATARACT EXTRACTION W/ INTRAOCULAR LENS IMPLANT    . COLONOSCOPY WITH PROPOFOL N/A 02/21/2015   Procedure: COLONOSCOPY WITH PROPOFOL;  Surgeon: Lucilla Lame, MD;  Location: [place];  Service: Endoscopy;  Laterality: N/A;  . [place]  . POLYPECTOMY  02/21/2015   Procedure: POLYPECTOMY;  Surgeon: Lucilla Lame, MD;  Location: [place];  Service: Endoscopy;;    FAMILY HISTORY: Family History  Problem Relation Age of Onset  . Heart disease Mother   . Stroke Father   . Heart disease Brother   . Kidney cancer Neg Hx   . Prostate cancer Neg Hx   . Bladder Cancer Neg Hx   . Breast cancer Neg Hx     ADVANCED DIRECTIVES (Y/N):  N  HEALTH MAINTENANCE: Social History  Substance Use Topics  . Smoking status: Never Smoker  . Smokeless tobacco: Never Used  . Alcohol use No     Colonoscopy:  PAP:  Bone density:  Lipid panel:  No Known Allergies  Current Outpatient Prescriptions  Medication Sig Dispense Refill  . aspirin EC 81 MG tablet Take 81 mg by mouth daily.    . Cyanocobalamin (B-12 PO) Take 1 tablet by mouth daily.    . fexofenadine (ALLEGRA) 30 MG tablet Take 30 mg by mouth 2 (two) times daily.    Marland Kitchen lenalidomide (REVLIMID) 15 MG capsule Take 1 capsule (15 mg total) by mouth daily. For 21 days with 7 days off. 21 capsule 0  . levothyroxine (SYNTHROID, LEVOTHROID) 50 MCG tablet Take 50 mcg by mouth daily before  breakfast.     . Multiple Vitamins-Minerals (CENTRUM SILVER PO) Take 1 tablet by mouth daily.    . prochlorperazine (COMPAZINE) 10 MG tablet Take 1 tablet (10 mg total) by mouth every 6 (six) hours as needed for nausea or vomiting. 30 tablet 0  . RESTASIS 0.05 % ophthalmic emulsion 1 drop daily.     Marland Kitchen triamcinolone (NASACORT ALLERGY 24HR) 55 MCG/ACT AERO nasal inhaler Place 2 sprays into the nose 2 (two) times daily.    Marland Kitchen UNABLE TO FIND Allergy  injections once a week     No current facility-administered medications for this visit.     OBJECTIVE: Vitals:   08/07/16 1341  BP: 114/69  Pulse: 69  Resp: 18  Temp: 98.4 F (36.9 C)     Body mass index is 24.31 kg/m.    ECOG FS:0 - Asymptomatic  General: Well-developed, well-nourished, no acute distress. Eyes: Pink conjunctiva, anicteric sclera. Lungs: Clear to auscultation bilaterally. Heart: Regular rate and rhythm. No rubs, murmurs, or gallops. Abdomen: Soft, nontender, nondistended. No organomegaly noted, normoactive bowel sounds. Musculoskeletal: No edema, cyanosis, or clubbing. Neuro: Alert, answering all questions appropriately. Cranial nerves grossly intact. Skin: No rashes or petechiae noted. Psych: Normal affect.   LAB RESULTS:  Lab Results  Component Value Date   NA 138 08/07/2016   K 3.9 08/07/2016   CL 109 08/07/2016   CO2 22 08/07/2016   GLUCOSE 115 (H) 08/07/2016   BUN 16 08/07/2016   CREATININE 0.75 08/07/2016   CALCIUM 8.2 (L) 08/07/2016   PROT 6.8 05/07/2016   ALBUMIN 4.4 05/07/2016   AST 19 05/07/2016   ALT 14 05/07/2016   ALKPHOS 104 05/07/2016   BILITOT 0.6 05/07/2016   GFRNONAA >60 08/07/2016   GFRAA >60 08/07/2016    Lab Results  Component Value Date   WBC 2.7 (L) 08/07/2016   NEUTROABS 1.2 (L) 08/07/2016   HGB 11.9 (L) 08/07/2016   HCT 34.0 (L) 08/07/2016   MCV 94.5 08/07/2016   PLT 154 08/07/2016   Lab Results  Component Value Date   TOTALPROTELP 5.6 (L) 08/07/2016   ALBUMINELP 3.5 08/07/2016   A1GS 0.2 08/07/2016   A2GS 0.6 08/07/2016   BETS 0.8 08/07/2016   GAMS 0.5 08/07/2016   MSPIKE Not Observed 08/07/2016   SPEI Comment 08/07/2016     STUDIES: No results found.  ASSESSMENT: Multiple myeloma not having achieve remission  PLAN:    1. Multiple myeloma not having achieve remission: Patient's calcium levels had been trending down previously, but are within normal limits today, therefore will proceed with Zometa.  Patient's M spike is now undetectable. Her lambda free light chains seem to have plateaued and are essentially unchanged since June 07, 2016.  Bone marrow biopsy at 44% plasma cells with normal cytogenetics. Given the results of her metastatic bone survey, her elevated lambda free chains, and her bone marrow biopsy, patient fits the criteria for multiple myeloma. Continue with dose reduced Revlimid at 15 mg daily for 21 days with 7 days off. Continue Zometa every 4 weeks. Can consider decreasing patient to maintenance dose Revlimid 10 mg daily in the near future. Return to clinic in 4 weeks for labs, evaluation and continuation of Zometa.  2. Hypercalcemia: Resolved. Secondary to underlying myeloma, Zometa as above. Calcium is 8.2 today. Monitor.  Patient expressed understanding and was in agreement with this plan. She also understands that She can call clinic at any time with any questions, concerns, or complaints.    Rhonda [name]  Rhonda Ormond, MD 08/11/16 8:05 AM

## 2016-08-07 ENCOUNTER — Inpatient Hospital Stay: Payer: Medicare Other | Attending: Oncology | Admitting: Oncology

## 2016-08-07 ENCOUNTER — Inpatient Hospital Stay: Payer: Medicare Other

## 2016-08-07 VITALS — BP 114/69 | HR 69 | Temp 98.4°F | Resp 18 | Wt 132.9 lb

## 2016-08-07 DIAGNOSIS — Z7982 Long term (current) use of aspirin: Secondary | ICD-10-CM | POA: Insufficient documentation

## 2016-08-07 DIAGNOSIS — Z79899 Other long term (current) drug therapy: Secondary | ICD-10-CM | POA: Diagnosis not present

## 2016-08-07 DIAGNOSIS — E785 Hyperlipidemia, unspecified: Secondary | ICD-10-CM | POA: Diagnosis not present

## 2016-08-07 DIAGNOSIS — K219 Gastro-esophageal reflux disease without esophagitis: Secondary | ICD-10-CM | POA: Diagnosis not present

## 2016-08-07 DIAGNOSIS — C9000 Multiple myeloma not having achieved remission: Secondary | ICD-10-CM

## 2016-08-07 DIAGNOSIS — E039 Hypothyroidism, unspecified: Secondary | ICD-10-CM | POA: Diagnosis not present

## 2016-08-07 DIAGNOSIS — Z85828 Personal history of other malignant neoplasm of skin: Secondary | ICD-10-CM | POA: Diagnosis not present

## 2016-08-07 LAB — CBC WITH DIFFERENTIAL/PLATELET
Basophils Absolute: 0 10*3/uL (ref 0–0.1)
Basophils Relative: 2 %
Eosinophils Absolute: 0.1 10*3/uL (ref 0–0.7)
Eosinophils Relative: 4 %
HEMATOCRIT: 34 % — AB (ref 35.0–47.0)
Hemoglobin: 11.9 g/dL — ABNORMAL LOW (ref 12.0–16.0)
LYMPHS PCT: 34 %
Lymphs Abs: 0.9 10*3/uL — ABNORMAL LOW (ref 1.0–3.6)
MCH: 33 pg (ref 26.0–34.0)
MCHC: 34.9 g/dL (ref 32.0–36.0)
MCV: 94.5 fL (ref 80.0–100.0)
MONO ABS: 0.4 10*3/uL (ref 0.2–0.9)
MONOS PCT: 16 %
NEUTROS ABS: 1.2 10*3/uL — AB (ref 1.4–6.5)
Neutrophils Relative %: 44 %
Platelets: 154 10*3/uL (ref 150–440)
RBC: 3.59 MIL/uL — ABNORMAL LOW (ref 3.80–5.20)
RDW: 14.1 % (ref 11.5–14.5)
WBC: 2.7 10*3/uL — ABNORMAL LOW (ref 3.6–11.0)

## 2016-08-07 LAB — BASIC METABOLIC PANEL
ANION GAP: 7 (ref 5–15)
BUN: 16 mg/dL (ref 6–20)
CHLORIDE: 109 mmol/L (ref 101–111)
CO2: 22 mmol/L (ref 22–32)
Calcium: 8.2 mg/dL — ABNORMAL LOW (ref 8.9–10.3)
Creatinine, Ser: 0.75 mg/dL (ref 0.44–1.00)
GFR calc Af Amer: >60 mL/min (ref >60)
GFR calc non Af Amer: >60 mL/min (ref >60)
GLUCOSE: 115 mg/dL — AB (ref 65–99)
POTASSIUM: 3.9 mmol/L (ref 3.5–5.1)
Sodium: 138 mmol/L (ref 135–145)

## 2016-08-07 MED ORDER — SODIUM CHLORIDE 0.9 % IV SOLN
Freq: Once | INTRAVENOUS | Status: AC
  Administered 2016-08-07: 15:00:00 via INTRAVENOUS
  Filled 2016-08-07: qty 1000

## 2016-08-07 MED ORDER — ZOLEDRONIC ACID 4 MG/5ML IV CONC
3.3000 mg | Freq: Once | INTRAVENOUS | Status: AC
  Administered 2016-08-07: 3.3 mg via INTRAVENOUS
  Filled 2016-08-07: qty 4.13

## 2016-08-07 NOTE — Progress Notes (Signed)
OK to proceed with Zometa today per Dr. Grayland Ormond. LJ

## 2016-08-07 NOTE — Progress Notes (Signed)
Patient needs refill on Revlimid.  Has one more pill for tomorrow, then will be on 7 day break.

## 2016-08-08 LAB — PROTEIN ELECTROPHORESIS, SERUM
A/G Ratio: 1.7 (ref 0.7–1.7)
ALPHA-1-GLOBULIN: 0.2 g/dL (ref 0.0–0.4)
ALPHA-2-GLOBULIN: 0.6 g/dL (ref 0.4–1.0)
Albumin ELP: 3.5 g/dL (ref 2.9–4.4)
BETA GLOBULIN: 0.8 g/dL (ref 0.7–1.3)
GLOBULIN, TOTAL: 2.1 g/dL — AB (ref 2.2–3.9)
Gamma Globulin: 0.5 g/dL (ref 0.4–1.8)
Total Protein ELP: 5.6 g/dL — ABNORMAL LOW (ref 6.0–8.5)

## 2016-08-08 LAB — IGG, IGA, IGM
IGA: 53 mg/dL — AB (ref 64–422)
IGG (IMMUNOGLOBIN G), SERUM: 513 mg/dL — AB (ref 700–1600)
IGM, SERUM: 22 mg/dL — AB (ref 26–217)

## 2016-08-08 LAB — KAPPA/LAMBDA LIGHT CHAINS
KAPPA FREE LGHT CHN: 16.7 mg/L (ref 3.3–19.4)
Kappa, lambda light chain ratio: 0.02 — ABNORMAL LOW (ref 0.26–1.65)
Lambda free light chains: 1038.9 mg/L — ABNORMAL HIGH (ref 5.7–26.3)

## 2016-09-02 NOTE — Progress Notes (Signed)
[place]  Telephone:(336) [phone] Fax:(336) [phone]  ID: Rhonda [name] OB: August 26, 1933  MR#: [id number]  ZPH#:[id number]  Patient Care Team: Lavera Guise, MD as PCP - General (Internal Medicine)  CHIEF COMPLAINT:  Multiple myeloma not having achieved remission.  INTERVAL HISTORY: Patient returns to clinic today for further evaluation and follow-up. She is having worsening neuropathy particularly in her feet. She otherwise is tolerating dose reduced Revlimid without significant side effects. She otherwise feels well and is asymptomatic. She has no other neurologic complaints. She denies any recent fevers or illnesses. She has a good appetite and denies weight loss. She denies any chest [name] or shortness of breath. She denies any nausea, vomiting, constipation, or diarrhea.  She has no hematuria, melena, or hematochezia. She has no urinary complaints.  Patient offers no specific complaints today.   REVIEW OF SYSTEMS:   Review of Systems  Constitutional: Negative.  Negative for fever, malaise/fatigue and weight loss.  HENT: Negative for congestion and sore throat.   Respiratory: Negative for cough and shortness of breath.   Cardiovascular: Negative for chest [name], palpitations and leg swelling.  Gastrointestinal: Negative for abdominal [name], blood in stool, constipation, diarrhea, melena, nausea and vomiting.  Genitourinary: Negative.   Musculoskeletal: Negative.   Neurological: Positive for sensory change. Negative for dizziness, tingling, weakness and headaches.  Psychiatric/Behavioral: Negative.  The patient is not nervous/anxious and does not have insomnia.     As per HPI. Otherwise, a complete review of systems is negative.  PAST MEDICAL HISTORY: Past Medical History:  Diagnosis Date  . Arthritis    hands  . Cataract   . GERD (gastroesophageal reflux disease)   . Hyperlipemia   . Hypothyroidism   . Multiple myeloma ([place]) 04/18/2016  . Osteoporosis   .  Skin cancer    basal cell    PAST SURGICAL HISTORY: Past Surgical History:  Procedure Laterality Date  . BREAST EXCISIONAL BIOPSY Right 15+ yrs ago   EXCISIONAL - NEG  . CATARACT EXTRACTION W/ INTRAOCULAR LENS IMPLANT    . COLONOSCOPY WITH PROPOFOL N/A 02/21/2015   Procedure: COLONOSCOPY WITH PROPOFOL;  Surgeon: Lucilla Lame, MD;  Location: [place];  Service: Endoscopy;  Laterality: N/A;  . [place]  . POLYPECTOMY  02/21/2015   Procedure: POLYPECTOMY;  Surgeon: Lucilla Lame, MD;  Location: [place];  Service: Endoscopy;;    FAMILY HISTORY: Family History  Problem Relation Age of Onset  . Heart disease Mother   . Stroke Father   . Heart disease Brother   . Kidney cancer Neg Hx   . Prostate cancer Neg Hx   . Bladder Cancer Neg Hx   . Breast cancer Neg Hx     ADVANCED DIRECTIVES (Y/N):  N  HEALTH MAINTENANCE: Social History  Substance Use Topics  . Smoking status: Never Smoker  . Smokeless tobacco: Never Used  . Alcohol use No     Colonoscopy:  PAP:  Bone density:  Lipid panel:  No Known Allergies  Current Outpatient Prescriptions  Medication Sig Dispense Refill  . aspirin EC 81 MG tablet Take 81 mg by mouth daily.    . Cyanocobalamin (B-12 PO) Take 1 tablet by mouth daily.    . fexofenadine (ALLEGRA) 30 MG tablet Take 30 mg by mouth daily.     Marland Kitchen lenalidomide (REVLIMID) 15 MG capsule Take 1 capsule (15 mg total) by mouth daily. For 21 days with 7 days off. 21 capsule 0  . levothyroxine (  SYNTHROID, LEVOTHROID) 50 MCG tablet Take 50 mcg by mouth daily before breakfast.     . Multiple Vitamins-Minerals (CENTRUM SILVER PO) Take 1 tablet by mouth daily.    . prochlorperazine (COMPAZINE) 10 MG tablet Take 1 tablet (10 mg total) by mouth every 6 (six) hours as needed for nausea or vomiting. 30 tablet 0  . RESTASIS 0.05 % ophthalmic emulsion Place 1 drop into both eyes daily.     Marland Kitchen triamcinolone (NASACORT ALLERGY 24HR) 55 MCG/ACT  AERO nasal inhaler Place 2 sprays into the nose 2 (two) times daily.    Marland Kitchen UNABLE TO FIND Allergy injections once a week     No current facility-administered medications for this visit.     OBJECTIVE: Vitals:   09/04/16 1442  BP: 114/69  Pulse: 72  Resp: 18  Temp: 99 F (37.2 C)     Body mass index is 24.45 kg/m.    ECOG FS:0 - Asymptomatic  General: Well-developed, well-nourished, no acute distress. Eyes: Pink conjunctiva, anicteric sclera. Lungs: Clear to auscultation bilaterally. Heart: Regular rate and rhythm. No rubs, murmurs, or gallops. Abdomen: Soft, nontender, nondistended. No organomegaly noted, normoactive bowel sounds. Musculoskeletal: No edema, cyanosis, or clubbing. Neuro: Alert, answering all questions appropriately. Cranial nerves grossly intact. Skin: No rashes or petechiae noted. Psych: Normal affect.   LAB RESULTS:  Lab Results  Component Value Date   NA 138 09/04/2016   K 3.6 09/04/2016   CL 108 09/04/2016   CO2 25 09/04/2016   GLUCOSE 106 (H) 09/04/2016   BUN 16 09/04/2016   CREATININE 0.86 09/04/2016   CALCIUM 8.2 (L) 09/04/2016   PROT 6.8 05/07/2016   ALBUMIN 4.4 05/07/2016   AST 19 05/07/2016   ALT 14 05/07/2016   ALKPHOS 104 05/07/2016   BILITOT 0.6 05/07/2016   GFRNONAA >60 09/04/2016   GFRAA >60 09/04/2016    Lab Results  Component Value Date   WBC 4.6 09/04/2016   NEUTROABS 2.8 09/04/2016   HGB 12.4 09/04/2016   HCT 35.0 09/04/2016   MCV 93.4 09/04/2016   PLT 145 (L) 09/04/2016   Lab Results  Component Value Date   TOTALPROTELP 5.6 (L) 08/07/2016   ALBUMINELP 3.5 08/07/2016   A1GS 0.2 08/07/2016   A2GS 0.6 08/07/2016   BETS 0.8 08/07/2016   GAMS 0.5 08/07/2016   MSPIKE Not Observed 08/07/2016   SPEI Comment 08/07/2016     STUDIES: No results found.  ASSESSMENT: Multiple myeloma not having achieve remission  PLAN:    1. Multiple myeloma not having achieve remission: Patient's calcium levels had been trending down  previously, but are within normal limits today, therefore will proceed with Zometa. Patient's M spike is now undetectable. Her lambda free light chains seem to have plateaued and are essentially unchanged since June 07, 2016.  Bone marrow biopsy at 44% plasma cells with normal cytogenetics. Given the results of her metastatic bone survey, her elevated lambda free chains, and her bone marrow biopsy, patient fits the criteria for multiple myeloma. Because of patient's peripheral neuropathy, we will discontinue Revlimid at this time. We will not initiate maintenance treatment given her neuropathy. Given Continue Zometa every 4 weeks.  Return to clinic in 4 weeks for labs, evaluation and continuation of Zometa.  2. Hypercalcemia: Resolved. Secondary to underlying myeloma, Zometa as above. Calcium is 8.2 today. Monitor. 3. Thrombocytopenia: Mild, monitor.  Patient expressed understanding and was in agreement with this plan. She also understands that She can call clinic at any time with any  questions, concerns, or complaints.    Lloyd Huger, MD 09/04/16 2:50 PM

## 2016-09-04 ENCOUNTER — Inpatient Hospital Stay (HOSPITAL_BASED_OUTPATIENT_CLINIC_OR_DEPARTMENT_OTHER): Payer: Medicare Other | Admitting: Oncology

## 2016-09-04 ENCOUNTER — Inpatient Hospital Stay: Payer: Medicare Other

## 2016-09-04 ENCOUNTER — Encounter: Payer: Self-pay | Admitting: Oncology

## 2016-09-04 ENCOUNTER — Inpatient Hospital Stay: Payer: Medicare Other | Attending: Oncology

## 2016-09-04 VITALS — BP 114/69 | HR 72 | Temp 99.0°F | Resp 18 | Ht 62.0 in | Wt 133.7 lb

## 2016-09-04 DIAGNOSIS — E039 Hypothyroidism, unspecified: Secondary | ICD-10-CM | POA: Diagnosis not present

## 2016-09-04 DIAGNOSIS — C9000 Multiple myeloma not having achieved remission: Secondary | ICD-10-CM

## 2016-09-04 DIAGNOSIS — K219 Gastro-esophageal reflux disease without esophagitis: Secondary | ICD-10-CM | POA: Diagnosis not present

## 2016-09-04 DIAGNOSIS — M1990 Unspecified osteoarthritis, unspecified site: Secondary | ICD-10-CM | POA: Diagnosis not present

## 2016-09-04 DIAGNOSIS — Z79899 Other long term (current) drug therapy: Secondary | ICD-10-CM | POA: Insufficient documentation

## 2016-09-04 DIAGNOSIS — E785 Hyperlipidemia, unspecified: Secondary | ICD-10-CM | POA: Diagnosis not present

## 2016-09-04 DIAGNOSIS — M810 Age-related osteoporosis without current pathological fracture: Secondary | ICD-10-CM | POA: Insufficient documentation

## 2016-09-04 DIAGNOSIS — D696 Thrombocytopenia, unspecified: Secondary | ICD-10-CM | POA: Insufficient documentation

## 2016-09-04 LAB — CBC WITH DIFFERENTIAL/PLATELET
Basophils Absolute: 0.1 10*3/uL (ref 0–0.1)
Basophils Relative: 2 %
Eosinophils Absolute: 0.2 10*3/uL (ref 0–0.7)
Eosinophils Relative: 3 %
HEMATOCRIT: 35 % (ref 35.0–47.0)
HEMOGLOBIN: 12.4 g/dL (ref 12.0–16.0)
LYMPHS ABS: 0.9 10*3/uL — AB (ref 1.0–3.6)
LYMPHS PCT: 19 %
MCH: 33 pg (ref 26.0–34.0)
MCHC: 35.3 g/dL (ref 32.0–36.0)
MCV: 93.4 fL (ref 80.0–100.0)
MONOS PCT: 14 %
Monocytes Absolute: 0.7 10*3/uL (ref 0.2–0.9)
NEUTROS ABS: 2.8 10*3/uL (ref 1.4–6.5)
Neutrophils Relative %: 62 %
Platelets: 145 10*3/uL — ABNORMAL LOW (ref 150–440)
RBC: 3.75 MIL/uL — ABNORMAL LOW (ref 3.80–5.20)
RDW: 14.5 % (ref 11.5–14.5)
WBC: 4.6 10*3/uL (ref 3.6–11.0)

## 2016-09-04 LAB — BASIC METABOLIC PANEL
ANION GAP: 5 (ref 5–15)
BUN: 16 mg/dL (ref 6–20)
CHLORIDE: 108 mmol/L (ref 101–111)
CO2: 25 mmol/L (ref 22–32)
Calcium: 8.2 mg/dL — ABNORMAL LOW (ref 8.9–10.3)
Creatinine, Ser: 0.86 mg/dL (ref 0.44–1.00)
GFR calc Af Amer: >60 mL/min (ref >60)
GFR calc non Af Amer: >60 mL/min (ref >60)
Glucose, Bld: 106 mg/dL — ABNORMAL HIGH (ref 65–99)
POTASSIUM: 3.6 mmol/L (ref 3.5–5.1)
Sodium: 138 mmol/L (ref 135–145)

## 2016-09-04 MED ORDER — SODIUM CHLORIDE 0.9 % IV SOLN
3.3000 mg | Freq: Once | INTRAVENOUS | Status: AC
  Administered 2016-09-04: 3.3 mg via INTRAVENOUS
  Filled 2016-09-04: qty 4.13

## 2016-09-04 NOTE — Progress Notes (Signed)
Pt finishes her last day of 21 day cycle tom.  Pt having more trouble with numbness and tingling with hands worse than feet.

## 2016-09-05 LAB — PROTEIN ELECTROPHORESIS, SERUM
A/G RATIO SPE: 2 — AB (ref 0.7–1.7)
ALBUMIN ELP: 3.8 g/dL (ref 2.9–4.4)
Alpha-1-Globulin: 0.2 g/dL (ref 0.0–0.4)
Alpha-2-Globulin: 0.5 g/dL (ref 0.4–1.0)
Beta Globulin: 0.7 g/dL (ref 0.7–1.3)
GLOBULIN, TOTAL: 1.9 g/dL — AB (ref 2.2–3.9)
Gamma Globulin: 0.5 g/dL (ref 0.4–1.8)
TOTAL PROTEIN ELP: 5.7 g/dL — AB (ref 6.0–8.5)

## 2016-09-05 LAB — IGG, IGA, IGM
IGA: 51 mg/dL — AB (ref 64–422)
IGM, SERUM: 18 mg/dL — AB (ref 26–217)
IgG (Immunoglobin G), Serum: 529 mg/dL — ABNORMAL LOW (ref 700–1600)

## 2016-09-05 LAB — KAPPA/LAMBDA LIGHT CHAINS
KAPPA, LAMDA LIGHT CHAIN RATIO: 0.02 — AB (ref 0.26–1.65)
Kappa free light chain: 16.1 mg/L (ref 3.3–19.4)
Lambda free light chains: 848.3 mg/L — ABNORMAL HIGH (ref 5.7–26.3)

## 2016-09-06 ENCOUNTER — Other Ambulatory Visit: Payer: Self-pay

## 2016-09-06 DIAGNOSIS — C9000 Multiple myeloma not having achieved remission: Secondary | ICD-10-CM

## 2016-09-06 MED ORDER — LENALIDOMIDE 15 MG PO CAPS: 15.0000 mg | ORAL_CAPSULE | Freq: Every day | ORAL | 0 refills | Status: DC

## 2016-10-01 NOTE — Progress Notes (Signed)
[place]  Telephone:(336) [phone] Fax:(336) [phone]  ID: [name] OB: 1933-08-26  MR#: [id number]  [id number]#:[id number]  Patient Care Team: Lavera Guise, MD as PCP - General (Internal Medicine)  CHIEF COMPLAINT:  Multiple myeloma not having achieved remission.  INTERVAL HISTORY: Patient returns to clinic today for further evaluation and continuation of Zometa. The neuropathy in her feet is still evident, but slightly improved. She otherwise feels well and is asymptomatic. She has no other neurologic complaints. She denies any recent fevers or illnesses. She has a good appetite and denies weight loss. She denies any chest pain or shortness of breath. She denies any nausea, vomiting, constipation, or diarrhea.  She has no hematuria, melena, or hematochezia. She has no urinary complaints.  Patient offers no specific complaints today.   REVIEW OF SYSTEMS:   Review of Systems  Constitutional: Negative.  Negative for fever, malaise/fatigue and weight loss.  HENT: Negative for congestion and sore throat.   Respiratory: Negative for cough and shortness of breath.   Cardiovascular: Negative for chest pain, palpitations and leg swelling.  Gastrointestinal: Negative for abdominal pain, blood in stool, constipation, diarrhea, melena, nausea and vomiting.  Genitourinary: Negative.   Musculoskeletal: Negative.   Neurological: Positive for sensory change. Negative for dizziness, tingling, weakness and headaches.  Psychiatric/Behavioral: Negative.  The patient is not nervous/anxious and does not have insomnia.     As per HPI. Otherwise, a complete review of systems is negative.  PAST MEDICAL HISTORY: Past Medical History:  Diagnosis Date  . Arthritis    hands  . Cataract   . GERD (gastroesophageal reflux disease)   . Hyperlipemia   . Hypothyroidism   . Multiple myeloma ([place]) 04/18/2016  . Osteoporosis   . Skin cancer    basal cell    PAST SURGICAL HISTORY: Past  Surgical History:  Procedure Laterality Date  . BREAST EXCISIONAL BIOPSY Right 15+ yrs ago   EXCISIONAL - NEG  . CATARACT EXTRACTION W/ INTRAOCULAR LENS IMPLANT    . COLONOSCOPY WITH PROPOFOL N/A 02/21/2015   Procedure: COLONOSCOPY WITH PROPOFOL;  Surgeon: Lucilla Lame, MD;  Location: [facility];  Service: Endoscopy;  Laterality: N/A;  . [place]  . POLYPECTOMY  02/21/2015   Procedure: POLYPECTOMY;  Surgeon: Lucilla Lame, MD;  Location: [facility];  Service: Endoscopy;;    FAMILY HISTORY: Family History  Problem Relation Age of Onset  . Heart disease Mother   . Stroke Father   . Heart disease Brother   . Kidney cancer Neg Hx   . Prostate cancer Neg Hx   . Bladder Cancer Neg Hx   . Breast cancer Neg Hx     ADVANCED DIRECTIVES (Y/N):  N  HEALTH MAINTENANCE: Social History  Substance Use Topics  . Smoking status: Never Smoker  . Smokeless tobacco: Never Used  . Alcohol use No     Colonoscopy:  PAP:  Bone density:  Lipid panel:  No Known Allergies  Current Outpatient Prescriptions  Medication Sig Dispense Refill  . aspirin EC 81 MG tablet Take 81 mg by mouth daily.    . Cyanocobalamin (B-12 PO) Take 1 tablet by mouth daily.    . fexofenadine (ALLEGRA) 30 MG tablet Take 30 mg by mouth daily.     Marland Kitchen lenalidomide (REVLIMID) 15 MG capsule Take 1 capsule (15 mg total) by mouth daily. For 21 days with 7 days off. 21 capsule 0  . levothyroxine (SYNTHROID, LEVOTHROID) 50 MCG tablet Take 50  mcg by mouth daily before breakfast.     . Multiple Vitamins-Minerals (CENTRUM SILVER PO) Take 1 tablet by mouth daily.    . RESTASIS 0.05 % ophthalmic emulsion Place 1 drop into both eyes daily.     Marland Kitchen triamcinolone (NASACORT ALLERGY 24HR) 55 MCG/ACT AERO nasal inhaler Place 2 sprays into the nose 2 (two) times daily.    Marland Kitchen UNABLE TO FIND Allergy injections once a week     No current facility-administered medications for this visit.      OBJECTIVE: Vitals:   10/02/16 1424  BP: (!) 144/79  Pulse: (!) 101  Temp: 98 F (36.7 C)     Body mass index is 24.11 kg/m.    ECOG FS:0 - Asymptomatic  General: Well-developed, well-nourished, no acute distress. Eyes: Pink conjunctiva, anicteric sclera. Lungs: Clear to auscultation bilaterally. Heart: Regular rate and rhythm. No rubs, murmurs, or gallops. Abdomen: Soft, nontender, nondistended. No organomegaly noted, normoactive bowel sounds. Musculoskeletal: No edema, cyanosis, or clubbing. Neuro: Alert, answering all questions appropriately. Cranial nerves grossly intact. Skin: No rashes or petechiae noted. Psych: Normal affect.   LAB RESULTS:  Lab Results  Component Value Date   NA 138 10/02/2016   K 3.8 10/02/2016   CL 108 10/02/2016   CO2 24 10/02/2016   GLUCOSE 114 (H) 10/02/2016   BUN 21 (H) 10/02/2016   CREATININE 0.72 10/02/2016   CALCIUM 9.3 10/02/2016   PROT 6.8 05/07/2016   ALBUMIN 4.4 05/07/2016   AST 19 05/07/2016   ALT 14 05/07/2016   ALKPHOS 104 05/07/2016   BILITOT 0.6 05/07/2016   GFRNONAA >60 10/02/2016   GFRAA >60 10/02/2016    Lab Results  Component Value Date   WBC 5.6 10/02/2016   NEUTROABS 3.5 10/02/2016   HGB 13.4 10/02/2016   HCT 38.0 10/02/2016   MCV 93.6 10/02/2016   PLT 169 10/02/2016   Lab Results  Component Value Date   TOTALPROTELP 6.3 10/02/2016   ALBUMINELP 3.8 10/02/2016   A1GS 0.2 10/02/2016   A2GS 0.7 10/02/2016   BETS 0.9 10/02/2016   GAMS 0.7 10/02/2016   MSPIKE 0.2 (H) 10/02/2016   SPEI Comment 10/02/2016     STUDIES: No results found.  ASSESSMENT: Multiple myeloma not having achieve remission  PLAN:    1. Multiple myeloma not having achieve remission: Patient's calcium levels had been trending down previously, but are within normal limits today, therefore will proceed with Zometa. Patient's M spike is mildly increased to 0.2. Her lambda free light chains are trending up as well.  Bone marrow biopsy  at 44% plasma cells with normal cytogenetics. Given the results of her metastatic bone survey, her elevated lambda free chains, and her bone marrow biopsy, patient fit the criteria for multiple myeloma. Because of patient's peripheral neuropathy, Revlimid was discontinued.  We may need to restart treatment in the near future though. Maintenance treatment was not started given her neuropathy. Continue 3.3 mg Zometa every 4 weeks.  Return to clinic in 4 weeks for labs, evaluation and continuation of Zometa.  2. Hypercalcemia: Resolved. Secondary to underlying myeloma, Zometa as above. Calcium is 9.3 today. Monitor. 3. Thrombocytopenia: Resolved, monitor.  Patient expressed understanding and was in agreement with this plan. She also understands that She can call clinic at any time with any questions, concerns, or complaints.    Lloyd Huger, MD 10/05/16 3:21 PM

## 2016-10-02 ENCOUNTER — Inpatient Hospital Stay: Payer: Medicare Other | Attending: Oncology

## 2016-10-02 ENCOUNTER — Inpatient Hospital Stay (HOSPITAL_BASED_OUTPATIENT_CLINIC_OR_DEPARTMENT_OTHER): Payer: Medicare Other | Admitting: Oncology

## 2016-10-02 ENCOUNTER — Encounter: Payer: Self-pay | Admitting: Oncology

## 2016-10-02 ENCOUNTER — Inpatient Hospital Stay: Payer: Medicare Other

## 2016-10-02 VITALS — BP 144/79 | HR 101 | Temp 98.0°F | Wt 131.8 lb

## 2016-10-02 DIAGNOSIS — G629 Polyneuropathy, unspecified: Secondary | ICD-10-CM | POA: Insufficient documentation

## 2016-10-02 DIAGNOSIS — E039 Hypothyroidism, unspecified: Secondary | ICD-10-CM | POA: Insufficient documentation

## 2016-10-02 DIAGNOSIS — Z7982 Long term (current) use of aspirin: Secondary | ICD-10-CM | POA: Diagnosis not present

## 2016-10-02 DIAGNOSIS — K219 Gastro-esophageal reflux disease without esophagitis: Secondary | ICD-10-CM | POA: Diagnosis not present

## 2016-10-02 DIAGNOSIS — Z85828 Personal history of other malignant neoplasm of skin: Secondary | ICD-10-CM | POA: Diagnosis not present

## 2016-10-02 DIAGNOSIS — Z79899 Other long term (current) drug therapy: Secondary | ICD-10-CM

## 2016-10-02 DIAGNOSIS — E785 Hyperlipidemia, unspecified: Secondary | ICD-10-CM | POA: Insufficient documentation

## 2016-10-02 DIAGNOSIS — C9000 Multiple myeloma not having achieved remission: Secondary | ICD-10-CM | POA: Insufficient documentation

## 2016-10-02 LAB — CBC WITH DIFFERENTIAL/PLATELET
BASOS ABS: 0 10*3/uL (ref 0–0.1)
Basophils Relative: 0 %
EOS ABS: 0.1 10*3/uL (ref 0–0.7)
Eosinophils Relative: 1 %
HCT: 38 % (ref 35.0–47.0)
HEMOGLOBIN: 13.4 g/dL (ref 12.0–16.0)
LYMPHS ABS: 1.5 10*3/uL (ref 1.0–3.6)
LYMPHS PCT: 26 %
MCH: 32.9 pg (ref 26.0–34.0)
MCHC: 35.2 g/dL (ref 32.0–36.0)
MCV: 93.6 fL (ref 80.0–100.0)
Monocytes Absolute: 0.6 10*3/uL (ref 0.2–0.9)
Monocytes Relative: 10 %
NEUTROS PCT: 63 %
Neutro Abs: 3.5 10*3/uL (ref 1.4–6.5)
PLATELETS: 169 10*3/uL (ref 150–440)
RBC: 4.06 MIL/uL (ref 3.80–5.20)
RDW: 14.2 % (ref 11.5–14.5)
WBC: 5.6 10*3/uL (ref 3.6–11.0)

## 2016-10-02 LAB — BASIC METABOLIC PANEL
Anion gap: 6 (ref 5–15)
BUN: 21 mg/dL — ABNORMAL HIGH (ref 6–20)
CHLORIDE: 108 mmol/L (ref 101–111)
CO2: 24 mmol/L (ref 22–32)
Calcium: 9.3 mg/dL (ref 8.9–10.3)
Creatinine, Ser: 0.72 mg/dL (ref 0.44–1.00)
Glucose, Bld: 114 mg/dL — ABNORMAL HIGH (ref 65–99)
POTASSIUM: 3.8 mmol/L (ref 3.5–5.1)
SODIUM: 138 mmol/L (ref 135–145)

## 2016-10-02 MED ORDER — SODIUM CHLORIDE 0.9 % IV SOLN
INTRAVENOUS | Status: DC
  Administered 2016-10-02: 15:00:00 via INTRAVENOUS
  Filled 2016-10-02: qty 1000

## 2016-10-02 MED ORDER — ZOLEDRONIC ACID 4 MG/5ML IV CONC
3.3000 mg | Freq: Once | INTRAVENOUS | Status: AC
  Administered 2016-10-02: 3.3 mg via INTRAVENOUS
  Filled 2016-10-02: qty 4.13

## 2016-10-03 LAB — PROTEIN ELECTROPHORESIS, SERUM
A/G RATIO SPE: 1.5 (ref 0.7–1.7)
ALBUMIN ELP: 3.8 g/dL (ref 2.9–4.4)
ALPHA-1-GLOBULIN: 0.2 g/dL (ref 0.0–0.4)
Alpha-2-Globulin: 0.7 g/dL (ref 0.4–1.0)
Beta Globulin: 0.9 g/dL (ref 0.7–1.3)
GAMMA GLOBULIN: 0.7 g/dL (ref 0.4–1.8)
Globulin, Total: 2.5 g/dL (ref 2.2–3.9)
M-Spike, %: 0.2 g/dL — ABNORMAL HIGH
TOTAL PROTEIN ELP: 6.3 g/dL (ref 6.0–8.5)

## 2016-10-03 LAB — IGG, IGA, IGM
IGA: 57 mg/dL — AB (ref 64–422)
IGM, SERUM: 18 mg/dL — AB (ref 26–217)
IgG (Immunoglobin G), Serum: 570 mg/dL — ABNORMAL LOW (ref 700–1600)

## 2016-10-03 LAB — KAPPA/LAMBDA LIGHT CHAINS
Kappa free light chain: 11.9 mg/L (ref 3.3–19.4)
Kappa, lambda light chain ratio: 0.01 — ABNORMAL LOW (ref 0.26–1.65)
Lambda free light chains: 1223.4 mg/L — ABNORMAL HIGH (ref 5.7–26.3)

## 2016-10-28 NOTE — Progress Notes (Signed)
[name]  Telephone:(336) [phone] Fax:(336) [phone]  ID: [name] OB: 25-Apr-1934  MR#: [id number]  YVO#:[id number]  Patient Care Team: Lavera Guise, MD as PCP - General (Internal Medicine)  CHIEF COMPLAINT:  Multiple myeloma not having achieved remission.  INTERVAL HISTORY: Patient returns to clinic today for further evaluation and continuation of Zometa. The neuropathy in her feet is nearly resolved. She otherwise feels well and is asymptomatic. She has no other neurologic complaints. She denies any recent fevers or illnesses. She has a good appetite and denies weight loss. She denies any chest pain or shortness of breath. She denies any nausea, vomiting, constipation, or diarrhea.  She has no hematuria, melena, or hematochezia. She has no urinary complaints.  Patient offers no further specific complaints today.   REVIEW OF SYSTEMS:   Review of Systems  Constitutional: Negative.  Negative for fever, malaise/fatigue and weight loss.  HENT: Negative for congestion and sore throat.   Respiratory: Negative for cough and shortness of breath.   Cardiovascular: Negative for chest pain, palpitations and leg swelling.  Gastrointestinal: Negative for abdominal pain, blood in stool, constipation, diarrhea, melena, nausea and vomiting.  Genitourinary: Negative.   Musculoskeletal: Negative.   Neurological: Positive for sensory change. Negative for dizziness, tingling, weakness and headaches.  Psychiatric/Behavioral: Negative.  The patient is not nervous/anxious and does not have insomnia.     As per HPI. Otherwise, a complete review of systems is negative.  PAST MEDICAL HISTORY: Past Medical History:  Diagnosis Date  . Arthritis    hands  . Cataract   . GERD (gastroesophageal reflux disease)   . Hyperlipemia   . Hypothyroidism   . Multiple myeloma ([place]) 04/18/2016  . Osteoporosis   . Skin cancer    basal cell    PAST SURGICAL HISTORY: Past Surgical  History:  Procedure Laterality Date  . BREAST EXCISIONAL BIOPSY Right 15+ yrs ago   EXCISIONAL - NEG  . CATARACT EXTRACTION W/ INTRAOCULAR LENS IMPLANT    . COLONOSCOPY WITH PROPOFOL N/A 02/21/2015   Procedure: COLONOSCOPY WITH PROPOFOL;  Surgeon: Lucilla Lame, MD;  Location: [facility];  Service: Endoscopy;  Laterality: N/A;  . [place]  . POLYPECTOMY  02/21/2015   Procedure: POLYPECTOMY;  Surgeon: Lucilla Lame, MD;  Location: [facility];  Service: Endoscopy;;    FAMILY HISTORY: Family History  Problem Relation Age of Onset  . Heart disease Mother   . Stroke Father   . Heart disease Brother   . Kidney cancer Neg Hx   . Prostate cancer Neg Hx   . Bladder Cancer Neg Hx   . Breast cancer Neg Hx     ADVANCED DIRECTIVES (Y/N):  N  HEALTH MAINTENANCE: Social History  Substance Use Topics  . Smoking status: Never Smoker  . Smokeless tobacco: Never Used  . Alcohol use No     Colonoscopy:  PAP:  Bone density:  Lipid panel:  No Known Allergies  Current Outpatient Prescriptions  Medication Sig Dispense Refill  . aspirin EC 81 MG tablet Take 81 mg by mouth daily.    . Cyanocobalamin (B-12 PO) Take 1 tablet by mouth daily.    . fexofenadine (ALLEGRA) 30 MG tablet Take 30 mg by mouth daily.     Marland Kitchen levothyroxine (SYNTHROID, LEVOTHROID) 50 MCG tablet Take 50 mcg by mouth daily before breakfast.     . Multiple Vitamins-Minerals (CENTRUM SILVER PO) Take 1 tablet by mouth daily.    . RESTASIS 0.05 %  ophthalmic emulsion Place 1 drop into both eyes daily.     Marland Kitchen triamcinolone (NASACORT ALLERGY 24HR) 55 MCG/ACT AERO nasal inhaler Place 2 sprays into the nose 2 (two) times daily.    Marland Kitchen UNABLE TO FIND Allergy injections once a week    . lenalidomide (REVLIMID) 15 MG capsule Take 1 capsule (15 mg total) by mouth daily. For 21 days with 7 days off. (Patient not taking: Reported on 10/30/2016) 21 capsule 0   No current facility-administered medications for  this visit.     OBJECTIVE: Vitals:   10/30/16 1421  BP: 132/80  Pulse: 74  Resp: 20  Temp: 98.6 F (37 C)     Body mass index is 24.03 kg/m.    ECOG FS:0 - Asymptomatic  General: Well-developed, well-nourished, no acute distress. Eyes: Pink conjunctiva, anicteric sclera. Lungs: Clear to auscultation bilaterally. Heart: Regular rate and rhythm. No rubs, murmurs, or gallops. Abdomen: Soft, nontender, nondistended. No organomegaly noted, normoactive bowel sounds. Musculoskeletal: No edema, cyanosis, or clubbing. Neuro: Alert, answering all questions appropriately. Cranial nerves grossly intact. Skin: No rashes or petechiae noted. Psych: Normal affect.   LAB RESULTS:  Lab Results  Component Value Date   NA 139 10/30/2016   K 4.0 10/30/2016   CL 107 10/30/2016   CO2 27 10/30/2016   GLUCOSE 104 (H) 10/30/2016   BUN 23 (H) 10/30/2016   CREATININE 0.76 10/30/2016   CALCIUM 9.7 10/30/2016   PROT 6.8 05/07/2016   ALBUMIN 4.4 05/07/2016   AST 19 05/07/2016   ALT 14 05/07/2016   ALKPHOS 104 05/07/2016   BILITOT 0.6 05/07/2016   GFRNONAA >60 10/30/2016   GFRAA >60 10/30/2016    Lab Results  Component Value Date   WBC 4.8 10/30/2016   NEUTROABS 2.8 10/30/2016   HGB 12.4 10/30/2016   HCT 35.0 10/30/2016   MCV 94.8 10/30/2016   PLT 211 10/30/2016   Lab Results  Component Value Date   TOTALPROTELP 5.8 (L) 10/30/2016   ALBUMINELP 3.6 10/30/2016   A1GS 0.2 10/30/2016   A2GS 0.6 10/30/2016   BETS 0.8 10/30/2016   GAMS 0.6 10/30/2016   MSPIKE 0.2 (H) 10/30/2016   SPEI Comment 10/30/2016     STUDIES: No results found.  ASSESSMENT: Multiple myeloma not having achieve remission  PLAN:    1. Multiple myeloma not having achieve remission: Patient's calcium levels had been trending down previously, but are within normal limits today, therefore will proceed with Zometa. Patient's M spike is mildly increased to 0.2. Her lambda free light chains are trending back down.   Bone marrow biopsy at 44% plasma cells with normal cytogenetics. Given the results of her metastatic bone survey, her elevated lambda free chains, and her bone marrow biopsy, patient fit the criteria for multiple myeloma. Because of patient's peripheral neuropathy, Revlimid was discontinued.  We may need to restart treatment in the near future though. Maintenance treatment was not started given her neuropathy. Continue 3.3 mg Zometa every 4 weeks.  Return to clinic in 4 weeks for labs, evaluation and continuation of Zometa.  2. Hypercalcemia: Resolved. Secondary to underlying myeloma, Zometa as above. Monitor.  3. Thrombocytopenia: Resolved, monitor.  Patient expressed understanding and was in agreement with this plan. She also understands that She can call clinic at any time with any questions, concerns, or complaints.    Lloyd Huger, MD 11/05/16 7:13 PM

## 2016-10-30 ENCOUNTER — Inpatient Hospital Stay (HOSPITAL_BASED_OUTPATIENT_CLINIC_OR_DEPARTMENT_OTHER): Payer: Medicare Other | Admitting: Oncology

## 2016-10-30 ENCOUNTER — Inpatient Hospital Stay: Payer: Medicare Other | Attending: Oncology

## 2016-10-30 ENCOUNTER — Inpatient Hospital Stay: Payer: Medicare Other

## 2016-10-30 VITALS — BP 132/80 | HR 74 | Temp 98.6°F | Resp 20 | Wt 131.4 lb

## 2016-10-30 DIAGNOSIS — E785 Hyperlipidemia, unspecified: Secondary | ICD-10-CM | POA: Insufficient documentation

## 2016-10-30 DIAGNOSIS — E039 Hypothyroidism, unspecified: Secondary | ICD-10-CM | POA: Diagnosis not present

## 2016-10-30 DIAGNOSIS — C9000 Multiple myeloma not having achieved remission: Secondary | ICD-10-CM | POA: Insufficient documentation

## 2016-10-30 DIAGNOSIS — K219 Gastro-esophageal reflux disease without esophagitis: Secondary | ICD-10-CM | POA: Insufficient documentation

## 2016-10-30 DIAGNOSIS — Z7982 Long term (current) use of aspirin: Secondary | ICD-10-CM | POA: Diagnosis not present

## 2016-10-30 DIAGNOSIS — Z79899 Other long term (current) drug therapy: Secondary | ICD-10-CM | POA: Diagnosis not present

## 2016-10-30 DIAGNOSIS — Z85828 Personal history of other malignant neoplasm of skin: Secondary | ICD-10-CM | POA: Insufficient documentation

## 2016-10-30 LAB — CBC WITH DIFFERENTIAL/PLATELET
BASOS ABS: 0 10*3/uL (ref 0–0.1)
BASOS PCT: 1 %
EOS PCT: 0 %
Eosinophils Absolute: 0 10*3/uL (ref 0–0.7)
HCT: 35 % (ref 35.0–47.0)
Hemoglobin: 12.4 g/dL (ref 12.0–16.0)
LYMPHS ABS: 1.5 10*3/uL (ref 1.0–3.6)
LYMPHS PCT: 31 %
MCH: 33.5 pg (ref 26.0–34.0)
MCHC: 35.4 g/dL (ref 32.0–36.0)
MCV: 94.8 fL (ref 80.0–100.0)
Monocytes Absolute: 0.5 10*3/uL (ref 0.2–0.9)
Monocytes Relative: 11 %
Neutro Abs: 2.8 10*3/uL (ref 1.4–6.5)
Neutrophils Relative %: 57 %
Platelets: 211 10*3/uL (ref 150–440)
RBC: 3.7 MIL/uL — ABNORMAL LOW (ref 3.80–5.20)
RDW: 14.1 % (ref 11.5–14.5)
WBC: 4.8 10*3/uL (ref 3.6–11.0)

## 2016-10-30 LAB — BASIC METABOLIC PANEL
ANION GAP: 5 (ref 5–15)
BUN: 23 mg/dL — AB (ref 6–20)
CHLORIDE: 107 mmol/L (ref 101–111)
CO2: 27 mmol/L (ref 22–32)
Calcium: 9.7 mg/dL (ref 8.9–10.3)
Creatinine, Ser: 0.76 mg/dL (ref 0.44–1.00)
Glucose, Bld: 104 mg/dL — ABNORMAL HIGH (ref 65–99)
POTASSIUM: 4 mmol/L (ref 3.5–5.1)
SODIUM: 139 mmol/L (ref 135–145)

## 2016-10-30 MED ORDER — ZOLEDRONIC ACID 4 MG/100ML IV SOLN
3.3000 mg | Freq: Once | INTRAVENOUS | Status: AC
  Administered 2016-10-30: 3.3 mg via INTRAVENOUS
  Filled 2016-10-30: qty 100

## 2016-10-30 MED ORDER — SODIUM CHLORIDE 0.9 % IV SOLN
INTRAVENOUS | Status: DC
  Administered 2016-10-30: 15:00:00 via INTRAVENOUS
  Filled 2016-10-30: qty 1000

## 2016-10-30 NOTE — Progress Notes (Signed)
Patient denies any concerns today.  

## 2016-10-31 LAB — PROTEIN ELECTROPHORESIS, SERUM
A/G Ratio: 1.6 (ref 0.7–1.7)
ALBUMIN ELP: 3.6 g/dL (ref 2.9–4.4)
ALPHA-1-GLOBULIN: 0.2 g/dL (ref 0.0–0.4)
ALPHA-2-GLOBULIN: 0.6 g/dL (ref 0.4–1.0)
Beta Globulin: 0.8 g/dL (ref 0.7–1.3)
GAMMA GLOBULIN: 0.6 g/dL (ref 0.4–1.8)
Globulin, Total: 2.2 g/dL (ref 2.2–3.9)
M-SPIKE, %: 0.2 g/dL — AB
TOTAL PROTEIN ELP: 5.8 g/dL — AB (ref 6.0–8.5)

## 2016-10-31 LAB — IGG, IGA, IGM
IgA: 37 mg/dL — ABNORMAL LOW (ref 64–422)
IgG (Immunoglobin G), Serum: 497 mg/dL — ABNORMAL LOW (ref 700–1600)
IgM, Serum: 17 mg/dL — ABNORMAL LOW (ref 26–217)

## 2016-10-31 LAB — KAPPA/LAMBDA LIGHT CHAINS
KAPPA FREE LGHT CHN: 10.5 mg/L (ref 3.3–19.4)
Kappa, lambda light chain ratio: 0.01 — ABNORMAL LOW (ref 0.26–1.65)
Lambda free light chains: 945.1 mg/L — ABNORMAL HIGH (ref 5.7–26.3)

## 2016-11-26 NOTE — Progress Notes (Signed)
[place]  Telephone:(336) [phone] Fax:(336) [phone]  ID: Rhonda [name] OB: Feb 16, 1934  MR#: [id number]  XTK#:[id number]  Patient Care Team: Lavera Guise, MD as PCP - General (Internal Medicine)  CHIEF COMPLAINT:  Multiple myeloma not having achieved remission.  INTERVAL HISTORY: Patient returns to clinic today for further evaluation and continuation of Zometa. The neuropathy in her feet has resolved. She otherwise feels well and is asymptomatic. She has no other neurologic complaints. She denies any recent fevers or illnesses. She has a good appetite and denies weight loss. She denies any chest [name] or shortness of breath. She denies any nausea, vomiting, constipation, or diarrhea.  She has no hematuria, melena, or hematochezia. She has no urinary complaints.  Patient offers no further specific complaints today.   REVIEW OF SYSTEMS:   Review of Systems  Constitutional: Negative.  Negative for fever, malaise/fatigue and weight loss.  HENT: Negative for congestion and sore throat.   Respiratory: Negative for cough and shortness of breath.   Cardiovascular: Negative for chest [name], palpitations and leg swelling.  Gastrointestinal: Negative for abdominal [name], blood in stool, constipation, diarrhea, melena, nausea and vomiting.  Genitourinary: Negative.   Musculoskeletal: Negative.   Neurological: Negative for dizziness, tingling, sensory change, weakness and headaches.  Psychiatric/Behavioral: Negative.  The patient is not nervous/anxious and does not have insomnia.     As per HPI. Otherwise, a complete review of systems is negative.  PAST MEDICAL HISTORY: Past Medical History:  Diagnosis Date  . Arthritis    hands  . Cataract   . GERD (gastroesophageal reflux disease)   . Hyperlipemia   . Hypothyroidism   . Multiple myeloma ([place]) 04/18/2016  . Osteoporosis   . Skin cancer    basal cell    PAST SURGICAL HISTORY: Past Surgical History:  Procedure  Laterality Date  . BREAST EXCISIONAL BIOPSY Right 15+ yrs ago   EXCISIONAL - NEG  . CATARACT EXTRACTION W/ INTRAOCULAR LENS IMPLANT    . COLONOSCOPY WITH PROPOFOL N/A 02/21/2015   Procedure: COLONOSCOPY WITH PROPOFOL;  Surgeon: Lucilla Lame, MD;  Location: [facility];  Service: Endoscopy;  Laterality: N/A;  . [place]  . POLYPECTOMY  02/21/2015   Procedure: POLYPECTOMY;  Surgeon: Lucilla Lame, MD;  Location: [facility];  Service: Endoscopy;;    FAMILY HISTORY: Family History  Problem Relation Age of Onset  . Heart disease Mother   . Stroke Father   . Heart disease Brother   . Kidney cancer Neg Hx   . Prostate cancer Neg Hx   . Bladder Cancer Neg Hx   . Breast cancer Neg Hx     ADVANCED DIRECTIVES (Y/N):  N  HEALTH MAINTENANCE: Social History  Substance Use Topics  . Smoking status: Never Smoker  . Smokeless tobacco: Never Used  . Alcohol use No     Colonoscopy:  PAP:  Bone density:  Lipid panel:  No Known Allergies  Current Outpatient Prescriptions  Medication Sig Dispense Refill  . aspirin EC 81 MG tablet Take 81 mg by mouth daily.    . Cyanocobalamin (B-12 PO) Take 1 tablet by mouth daily.    . fexofenadine (ALLEGRA) 30 MG tablet Take 30 mg by mouth daily.     Marland Kitchen levothyroxine (SYNTHROID, LEVOTHROID) 50 MCG tablet Take 50 mcg by mouth daily before breakfast.     . Multiple Vitamins-Minerals (CENTRUM SILVER PO) Take 1 tablet by mouth daily.    . RESTASIS 0.05 % ophthalmic emulsion  Place 1 drop into both eyes daily.     Marland Kitchen triamcinolone (NASACORT ALLERGY 24HR) 55 MCG/ACT AERO nasal inhaler Place 2 sprays into the nose 2 (two) times daily.    Marland Kitchen UNABLE TO FIND Allergy injections once a week    . lenalidomide (REVLIMID) 15 MG capsule Take 1 capsule (15 mg total) by mouth daily. For 21 days with 7 days off. (Patient not taking: Reported on 11/27/2016) 21 capsule 0   No current facility-administered medications for this visit.      OBJECTIVE: Vitals:   11/27/16 1427  BP: (!) 144/84  Pulse: 75  Resp: 20  Temp: 97.8 F (36.6 C)     Body mass index is 24.53 kg/m.    ECOG FS:0 - Asymptomatic  General: Well-developed, well-nourished, no acute distress. Eyes: Pink conjunctiva, anicteric sclera. Lungs: Clear to auscultation bilaterally. Heart: Regular rate and rhythm. No rubs, murmurs, or gallops. Abdomen: Soft, nontender, nondistended. No organomegaly noted, normoactive bowel sounds. Musculoskeletal: No edema, cyanosis, or clubbing. Neuro: Alert, answering all questions appropriately. Cranial nerves grossly intact. Skin: No rashes or petechiae noted. Psych: Normal affect.   LAB RESULTS:  Lab Results  Component Value Date   NA 139 11/27/2016   K 4.0 11/27/2016   CL 107 11/27/2016   CO2 25 11/27/2016   GLUCOSE 108 (H) 11/27/2016   BUN 20 11/27/2016   CREATININE 1.02 (H) 11/27/2016   CALCIUM 9.0 11/27/2016   PROT 6.8 05/07/2016   ALBUMIN 4.4 05/07/2016   AST 19 05/07/2016   ALT 14 05/07/2016   ALKPHOS 104 05/07/2016   BILITOT 0.6 05/07/2016   GFRNONAA 50 (L) 11/27/2016   GFRAA 58 (L) 11/27/2016    Lab Results  Component Value Date   WBC 6.0 11/27/2016   NEUTROABS 3.9 11/27/2016   HGB 12.6 11/27/2016   HCT 35.0 11/27/2016   MCV 94.3 11/27/2016   PLT 203 11/27/2016   Lab Results  Component Value Date   TOTALPROTELP 5.8 (L) 11/27/2016   ALBUMINELP 3.5 11/27/2016   A1GS 0.2 11/27/2016   A2GS 0.6 11/27/2016   BETS 0.9 11/27/2016   GAMS 0.6 11/27/2016   MSPIKE Not Observed 11/27/2016   SPEI Comment 11/27/2016     STUDIES: No results found.  ASSESSMENT: Multiple myeloma not having achieve remission  PLAN:    1. Multiple myeloma not having achieve remission: Patient's calcium levels had been trending down previously, but are within normal limits today, therefore will proceed with Zometa. Patient's M spike is Once again 0.0. Her lambda free light chains seem to be erratic between 848  and 1223. Previously, bone marrow biopsy had 44% plasma cells with normal cytogenetics. Given the results of her metastatic bone survey, her elevated lambda free chains, and her bone marrow biopsy, patient fit the criteria for multiple myeloma. Because of patient's peripheral neuropathy, Revlimid was discontinued.  We may need to restart treatment in the near future though. Maintenance treatment was not started given her neuropathy. Continue 3.3 mg Zometa every 4 weeks. Return to clinic in 4 weeks for labs, evaluation and continuation of Zometa.  2. Hypercalcemia: Resolved. Secondary to underlying myeloma, Zometa as above. Monitor.  3. Thrombocytopenia: Resolved, monitor.    Patient expressed understanding and was in agreement with this plan. She also understands that She can call clinic at any time with any questions, concerns, or complaints.    Lloyd Huger, MD 11/29/16 11:54 AM

## 2016-11-27 ENCOUNTER — Inpatient Hospital Stay: Payer: Medicare Other | Attending: Oncology | Admitting: Oncology

## 2016-11-27 ENCOUNTER — Inpatient Hospital Stay: Payer: Medicare Other

## 2016-11-27 VITALS — BP 144/84 | HR 75 | Temp 97.8°F | Resp 20 | Wt 134.1 lb

## 2016-11-27 DIAGNOSIS — C9000 Multiple myeloma not having achieved remission: Secondary | ICD-10-CM

## 2016-11-27 DIAGNOSIS — K219 Gastro-esophageal reflux disease without esophagitis: Secondary | ICD-10-CM | POA: Insufficient documentation

## 2016-11-27 DIAGNOSIS — M1990 Unspecified osteoarthritis, unspecified site: Secondary | ICD-10-CM | POA: Diagnosis not present

## 2016-11-27 DIAGNOSIS — E785 Hyperlipidemia, unspecified: Secondary | ICD-10-CM | POA: Insufficient documentation

## 2016-11-27 DIAGNOSIS — E039 Hypothyroidism, unspecified: Secondary | ICD-10-CM | POA: Diagnosis not present

## 2016-11-27 DIAGNOSIS — M818 Other osteoporosis without current pathological fracture: Secondary | ICD-10-CM | POA: Diagnosis not present

## 2016-11-27 DIAGNOSIS — Z79899 Other long term (current) drug therapy: Secondary | ICD-10-CM | POA: Insufficient documentation

## 2016-11-27 LAB — BASIC METABOLIC PANEL
Anion gap: 7 (ref 5–15)
BUN: 20 mg/dL (ref 6–20)
CO2: 25 mmol/L (ref 22–32)
CREATININE: 1.02 mg/dL — AB (ref 0.44–1.00)
Calcium: 9 mg/dL (ref 8.9–10.3)
Chloride: 107 mmol/L (ref 101–111)
GFR calc non Af Amer: 50 mL/min — ABNORMAL LOW (ref >60)
GFR, EST AFRICAN AMERICAN: 58 mL/min — AB (ref >60)
Glucose, Bld: 108 mg/dL — ABNORMAL HIGH (ref 65–99)
POTASSIUM: 4 mmol/L (ref 3.5–5.1)
SODIUM: 139 mmol/L (ref 135–145)

## 2016-11-27 LAB — CBC WITH DIFFERENTIAL/PLATELET
BASOS ABS: 0.1 10*3/uL (ref 0–0.1)
Basophils Relative: 1 %
EOS ABS: 0 10*3/uL (ref 0–0.7)
EOS PCT: 1 %
HCT: 35 % (ref 35.0–47.0)
Hemoglobin: 12.6 g/dL (ref 12.0–16.0)
Lymphocytes Relative: 22 %
Lymphs Abs: 1.3 10*3/uL (ref 1.0–3.6)
MCH: 33.8 pg (ref 26.0–34.0)
MCHC: 35.9 g/dL (ref 32.0–36.0)
MCV: 94.3 fL (ref 80.0–100.0)
Monocytes Absolute: 0.7 10*3/uL (ref 0.2–0.9)
Monocytes Relative: 11 %
NEUTROS PCT: 65 %
Neutro Abs: 3.9 10*3/uL (ref 1.4–6.5)
PLATELETS: 203 10*3/uL (ref 150–440)
RBC: 3.71 MIL/uL — AB (ref 3.80–5.20)
RDW: 13.2 % (ref 11.5–14.5)
WBC: 6 10*3/uL (ref 3.6–11.0)

## 2016-11-27 MED ORDER — ZOLEDRONIC ACID 4 MG/5ML IV CONC
3.3000 mg | Freq: Once | INTRAVENOUS | Status: AC
  Administered 2016-11-27: 3.3 mg via INTRAVENOUS
  Filled 2016-11-27: qty 4.13

## 2016-11-27 NOTE — Progress Notes (Signed)
Patient denies any concerns today.  

## 2016-11-28 LAB — PROTEIN ELECTROPHORESIS, SERUM
A/G RATIO SPE: 1.5 (ref 0.7–1.7)
ALBUMIN ELP: 3.5 g/dL (ref 2.9–4.4)
ALPHA-1-GLOBULIN: 0.2 g/dL (ref 0.0–0.4)
Alpha-2-Globulin: 0.6 g/dL (ref 0.4–1.0)
Beta Globulin: 0.9 g/dL (ref 0.7–1.3)
Gamma Globulin: 0.6 g/dL (ref 0.4–1.8)
Globulin, Total: 2.3 g/dL (ref 2.2–3.9)
Total Protein ELP: 5.8 g/dL — ABNORMAL LOW (ref 6.0–8.5)

## 2016-11-28 LAB — IGG, IGA, IGM
IgA: 36 mg/dL — ABNORMAL LOW (ref 64–422)
IgG (Immunoglobin G), Serum: 511 mg/dL — ABNORMAL LOW (ref 700–1600)
IgM, Serum: 17 mg/dL — ABNORMAL LOW (ref 26–217)

## 2016-11-28 LAB — KAPPA/LAMBDA LIGHT CHAINS
Kappa free light chain: 9.9 mg/L (ref 3.3–19.4)
Kappa, lambda light chain ratio: 0.01 — ABNORMAL LOW (ref 0.26–1.65)
Lambda free light chains: 1052.9 mg/L — ABNORMAL HIGH (ref 5.7–26.3)

## 2016-12-13 ENCOUNTER — Other Ambulatory Visit: Payer: Self-pay | Admitting: Internal Medicine

## 2016-12-13 DIAGNOSIS — K219 Gastro-esophageal reflux disease without esophagitis: Secondary | ICD-10-CM

## 2016-12-14 ENCOUNTER — Other Ambulatory Visit: Payer: Self-pay | Admitting: Internal Medicine

## 2016-12-14 DIAGNOSIS — K219 Gastro-esophageal reflux disease without esophagitis: Secondary | ICD-10-CM

## 2016-12-23 NOTE — Progress Notes (Signed)
[place]  Telephone:(336) [phone] Fax:(336) [phone]  ID: Rhonda [name] OB: 23-Nov-1933  MR#: [id number]  MMH#:[id number]  Patient Care Team: Lavera Guise, MD as PCP - General (Internal Medicine)  CHIEF COMPLAINT:  Multiple myeloma in remission.  INTERVAL HISTORY: Patient returns to clinic today for further evaluation and continuation of Zometa. The neuropathy in her feet has resolved. She otherwise feels well and is asymptomatic. She has no other neurologic complaints. She denies any recent fevers or illnesses. She has a good appetite and denies weight loss. She denies any chest [name] or shortness of breath. She denies any nausea, vomiting, constipation, or diarrhea.  She has no hematuria, melena, or hematochezia. She has no urinary complaints.  Patient offers no further specific complaints today.   REVIEW OF SYSTEMS:   Review of Systems  Constitutional: Negative.  Negative for fever, malaise/fatigue and weight loss.  HENT: Negative for congestion and sore throat.   Respiratory: Negative for cough and shortness of breath.   Cardiovascular: Negative for chest [name], palpitations and leg swelling.  Gastrointestinal: Negative for abdominal [name], blood in stool, constipation, diarrhea, melena, nausea and vomiting.  Genitourinary: Negative.   Musculoskeletal: Negative.   Neurological: Negative for dizziness, tingling, sensory change, weakness and headaches.  Psychiatric/Behavioral: Negative.  The patient is not nervous/anxious and does not have insomnia.     As per HPI. Otherwise, a complete review of systems is negative.  PAST MEDICAL HISTORY: Past Medical History:  Diagnosis Date  . Arthritis    hands  . Cataract   . GERD (gastroesophageal reflux disease)   . Hyperlipemia   . Hypothyroidism   . Multiple myeloma ([place]) 04/18/2016  . Osteoporosis   . Skin cancer    basal cell    PAST SURGICAL HISTORY: Past Surgical History:  Procedure Laterality Date  .  BREAST EXCISIONAL BIOPSY Right 15+ yrs ago   EXCISIONAL - NEG  . CATARACT EXTRACTION W/ INTRAOCULAR LENS IMPLANT    . COLONOSCOPY WITH PROPOFOL N/A 02/21/2015   Procedure: COLONOSCOPY WITH PROPOFOL;  Surgeon: Lucilla Lame, MD;  Location: [facility];  Service: Endoscopy;  Laterality: N/A;  . [place]  . POLYPECTOMY  02/21/2015   Procedure: POLYPECTOMY;  Surgeon: Lucilla Lame, MD;  Location: [facility];  Service: Endoscopy;;    FAMILY HISTORY: Family History  Problem Relation Age of Onset  . Heart disease Mother   . Stroke Father   . Heart disease Brother   . Kidney cancer Neg Hx   . Prostate cancer Neg Hx   . Bladder Cancer Neg Hx   . Breast cancer Neg Hx     ADVANCED DIRECTIVES (Y/N):  N  HEALTH MAINTENANCE: Social History  Substance Use Topics  . Smoking status: Never Smoker  . Smokeless tobacco: Never Used  . Alcohol use No     Colonoscopy:  PAP:  Bone density:  Lipid panel:  No Known Allergies  Current Outpatient Prescriptions  Medication Sig Dispense Refill  . aspirin EC 81 MG tablet Take 81 mg by mouth daily.    . Cyanocobalamin (B-12 PO) Take 1 tablet by mouth daily.    . fexofenadine (ALLEGRA) 30 MG tablet Take 30 mg by mouth daily.     Marland Kitchen levothyroxine (SYNTHROID, LEVOTHROID) 50 MCG tablet Take 50 mcg by mouth daily before breakfast.     . Multiple Vitamins-Minerals (CENTRUM SILVER PO) Take 1 tablet by mouth daily.    . RESTASIS 0.05 % ophthalmic emulsion Place 1  drop into both eyes daily.     Marland Kitchen triamcinolone (NASACORT ALLERGY 24HR) 55 MCG/ACT AERO nasal inhaler Place 2 sprays into the nose 2 (two) times daily.    Marland Kitchen UNABLE TO FIND Allergy injections once a week    . lenalidomide (REVLIMID) 15 MG capsule Take 1 capsule (15 mg total) by mouth daily. For 21 days with 7 days off. (Patient not taking: Reported on 11/27/2016) 21 capsule 0   No current facility-administered medications for this visit.     OBJECTIVE: Vitals:    12/25/16 1418  BP: 125/68  Pulse: 80  Resp: 18  Temp: 97.6 F (36.4 C)     Body mass index is 24.55 kg/m.    ECOG FS:0 - Asymptomatic  General: Well-developed, well-nourished, no acute distress. Eyes: Pink conjunctiva, anicteric sclera. Lungs: Clear to auscultation bilaterally. Heart: Regular rate and rhythm. No rubs, murmurs, or gallops. Abdomen: Soft, nontender, nondistended. No organomegaly noted, normoactive bowel sounds. Musculoskeletal: No edema, cyanosis, or clubbing. Neuro: Alert, answering all questions appropriately. Cranial nerves grossly intact. Skin: No rashes or petechiae noted. Psych: Normal affect.   LAB RESULTS:  Lab Results  Component Value Date   NA 136 12/25/2016   K 3.9 12/25/2016   CL 108 12/25/2016   CO2 22 12/25/2016   GLUCOSE 109 (H) 12/25/2016   BUN 18 12/25/2016   CREATININE 0.81 12/25/2016   CALCIUM 9.4 12/25/2016   PROT 6.8 05/07/2016   ALBUMIN 4.4 05/07/2016   AST 19 05/07/2016   ALT 14 05/07/2016   ALKPHOS 104 05/07/2016   BILITOT 0.6 05/07/2016   GFRNONAA >60 12/25/2016   GFRAA >60 12/25/2016    Lab Results  Component Value Date   WBC 5.8 12/25/2016   NEUTROABS 3.6 12/25/2016   HGB 12.4 12/25/2016   HCT 35.0 12/25/2016   MCV 94.1 12/25/2016   PLT 192 12/25/2016   Lab Results  Component Value Date   TOTALPROTELP 5.8 (L) 11/27/2016   ALBUMINELP 3.5 11/27/2016   A1GS 0.2 11/27/2016   A2GS 0.6 11/27/2016   BETS 0.9 11/27/2016   GAMS 0.6 11/27/2016   MSPIKE Not Observed 11/27/2016   SPEI Comment 11/27/2016     STUDIES: No results found.  ASSESSMENT:  Multiple myeloma in remission.  PLAN:    1.  Multiple myeloma in remission: Patient's calcium levels had been trending down previously, but are within normal limits today, therefore will proceed with Zometa. Patient's M spike fluctuates between 0.0 and 0.2. Her lambda free light chains seem to be erratic between 848 and 1223. Previously, bone marrow biopsy had 44% plasma  cells with normal cytogenetics. Given the results of her metastatic bone survey, her elevated lambda free chains, and her bone marrow biopsy, patient fit the criteria for multiple myeloma. Because of patient's peripheral neuropathy, Revlimid was discontinued.  We may need to restart treatment in the near future though. Maintenance treatment was not started given her neuropathy. Continue 3.3 mg Zometa every 4 weeks. Return to clinic in 4 weeks for laboratory work and Zometa and then in 8 weeks for further evaluation and continuation of Zometa.  2. Hypercalcemia: Resolved. Secondary to underlying myeloma, Zometa as above. Monitor.  3. Thrombocytopenia: Resolved, monitor.  Patient expressed understanding and was in agreement with this plan. She also understands that She can call clinic at any time with any questions, concerns, or complaints.    Lloyd Huger, MD 12/25/16 2:55 PM

## 2016-12-25 ENCOUNTER — Inpatient Hospital Stay (HOSPITAL_BASED_OUTPATIENT_CLINIC_OR_DEPARTMENT_OTHER): Payer: Medicare Other | Admitting: Oncology

## 2016-12-25 ENCOUNTER — Inpatient Hospital Stay: Payer: Medicare Other | Attending: Oncology

## 2016-12-25 ENCOUNTER — Inpatient Hospital Stay: Payer: Medicare Other

## 2016-12-25 VITALS — BP 125/68 | HR 80 | Temp 97.6°F | Resp 18 | Wt 134.2 lb

## 2016-12-25 DIAGNOSIS — K219 Gastro-esophageal reflux disease without esophagitis: Secondary | ICD-10-CM | POA: Diagnosis not present

## 2016-12-25 DIAGNOSIS — E785 Hyperlipidemia, unspecified: Secondary | ICD-10-CM | POA: Insufficient documentation

## 2016-12-25 DIAGNOSIS — C9001 Multiple myeloma in remission: Secondary | ICD-10-CM | POA: Insufficient documentation

## 2016-12-25 DIAGNOSIS — G629 Polyneuropathy, unspecified: Secondary | ICD-10-CM | POA: Insufficient documentation

## 2016-12-25 DIAGNOSIS — C9000 Multiple myeloma not having achieved remission: Secondary | ICD-10-CM

## 2016-12-25 DIAGNOSIS — Z85828 Personal history of other malignant neoplasm of skin: Secondary | ICD-10-CM | POA: Insufficient documentation

## 2016-12-25 DIAGNOSIS — Z79899 Other long term (current) drug therapy: Secondary | ICD-10-CM | POA: Diagnosis not present

## 2016-12-25 DIAGNOSIS — E039 Hypothyroidism, unspecified: Secondary | ICD-10-CM | POA: Diagnosis not present

## 2016-12-25 DIAGNOSIS — Z7982 Long term (current) use of aspirin: Secondary | ICD-10-CM | POA: Insufficient documentation

## 2016-12-25 LAB — CBC WITH DIFFERENTIAL/PLATELET
BASOS ABS: 0 10*3/uL (ref 0–0.1)
BASOS PCT: 1 %
EOS ABS: 0 10*3/uL (ref 0–0.7)
EOS PCT: 1 %
HCT: 35 % (ref 35.0–47.0)
Hemoglobin: 12.4 g/dL (ref 12.0–16.0)
Lymphocytes Relative: 26 %
Lymphs Abs: 1.5 10*3/uL (ref 1.0–3.6)
MCH: 33.3 pg (ref 26.0–34.0)
MCHC: 35.4 g/dL (ref 32.0–36.0)
MCV: 94.1 fL (ref 80.0–100.0)
MONO ABS: 0.6 10*3/uL (ref 0.2–0.9)
Monocytes Relative: 10 %
Neutro Abs: 3.6 10*3/uL (ref 1.4–6.5)
Neutrophils Relative %: 62 %
PLATELETS: 192 10*3/uL (ref 150–440)
RBC: 3.72 MIL/uL — ABNORMAL LOW (ref 3.80–5.20)
RDW: 12.9 % (ref 11.5–14.5)
WBC: 5.8 10*3/uL (ref 3.6–11.0)

## 2016-12-25 LAB — BASIC METABOLIC PANEL
ANION GAP: 6 (ref 5–15)
BUN: 18 mg/dL (ref 6–20)
CALCIUM: 9.4 mg/dL (ref 8.9–10.3)
CO2: 22 mmol/L (ref 22–32)
CREATININE: 0.81 mg/dL (ref 0.44–1.00)
Chloride: 108 mmol/L (ref 101–111)
GFR calc Af Amer: >60 mL/min (ref >60)
GLUCOSE: 109 mg/dL — AB (ref 65–99)
Potassium: 3.9 mmol/L (ref 3.5–5.1)
Sodium: 136 mmol/L (ref 135–145)

## 2016-12-25 MED ORDER — ZOLEDRONIC ACID 4 MG/5ML IV CONC
3.3000 mg | Freq: Once | INTRAVENOUS | Status: AC
  Administered 2016-12-25: 3.3 mg via INTRAVENOUS
  Filled 2016-12-25: qty 4.13

## 2016-12-25 MED ORDER — SODIUM CHLORIDE 0.9 % IV SOLN
INTRAVENOUS | Status: DC
  Administered 2016-12-25: 15:00:00 via INTRAVENOUS
  Filled 2016-12-25: qty 1000

## 2016-12-25 NOTE — Progress Notes (Signed)
Patient is here for follow up, she is doing well no complaints.  

## 2016-12-26 LAB — IGG, IGA, IGM
IGG (IMMUNOGLOBIN G), SERUM: 500 mg/dL — AB (ref 700–1600)
IgA: 46 mg/dL — ABNORMAL LOW (ref 64–422)
IgM, Serum: 17 mg/dL — ABNORMAL LOW (ref 26–217)

## 2016-12-26 LAB — KAPPA/LAMBDA LIGHT CHAINS
KAPPA FREE LGHT CHN: 9.2 mg/L (ref 3.3–19.4)
Kappa, lambda light chain ratio: 0.01 — ABNORMAL LOW (ref 0.26–1.65)
Lambda free light chains: 990.8 mg/L — ABNORMAL HIGH (ref 5.7–26.3)

## 2016-12-28 LAB — PROTEIN ELECTROPHORESIS, SERUM
A/G Ratio: 1.8 — ABNORMAL HIGH (ref 0.7–1.7)
Albumin ELP: 3.7 g/dL (ref 2.9–4.4)
Alpha-1-Globulin: 0.2 g/dL (ref 0.0–0.4)
Alpha-2-Globulin: 0.6 g/dL (ref 0.4–1.0)
Beta Globulin: 0.8 g/dL (ref 0.7–1.3)
GAMMA GLOBULIN: 0.6 g/dL (ref 0.4–1.8)
GLOBULIN, TOTAL: 2.1 g/dL — AB (ref 2.2–3.9)
M-SPIKE, %: 0.2 g/dL — AB
TOTAL PROTEIN ELP: 5.8 g/dL — AB (ref 6.0–8.5)

## 2017-01-01 ENCOUNTER — Ambulatory Visit
Admission: RE | Admit: 2017-01-01 | Discharge: 2017-01-01 | Disposition: A | Discharge status: Home / Self Care | Payer: Medicare Other | Source: Ambulatory Visit | Attending: Internal Medicine | Admitting: Internal Medicine

## 2017-01-01 DIAGNOSIS — K219 Gastro-esophageal reflux disease without esophagitis: Secondary | ICD-10-CM | POA: Insufficient documentation

## 2017-01-01 DIAGNOSIS — K5790 Diverticulosis of intestine, part unspecified, without perforation or abscess without bleeding: Secondary | ICD-10-CM | POA: Diagnosis not present

## 2017-01-22 ENCOUNTER — Inpatient Hospital Stay: Payer: Medicare Other | Attending: Oncology

## 2017-01-22 ENCOUNTER — Inpatient Hospital Stay: Payer: Medicare Other

## 2017-01-22 DIAGNOSIS — E039 Hypothyroidism, unspecified: Secondary | ICD-10-CM | POA: Diagnosis not present

## 2017-01-22 DIAGNOSIS — E785 Hyperlipidemia, unspecified: Secondary | ICD-10-CM | POA: Insufficient documentation

## 2017-01-22 DIAGNOSIS — Z79899 Other long term (current) drug therapy: Secondary | ICD-10-CM | POA: Diagnosis not present

## 2017-01-22 DIAGNOSIS — G629 Polyneuropathy, unspecified: Secondary | ICD-10-CM | POA: Diagnosis not present

## 2017-01-22 DIAGNOSIS — Z85828 Personal history of other malignant neoplasm of skin: Secondary | ICD-10-CM | POA: Insufficient documentation

## 2017-01-22 DIAGNOSIS — Z7982 Long term (current) use of aspirin: Secondary | ICD-10-CM | POA: Diagnosis not present

## 2017-01-22 DIAGNOSIS — K219 Gastro-esophageal reflux disease without esophagitis: Secondary | ICD-10-CM | POA: Diagnosis not present

## 2017-01-22 DIAGNOSIS — C9001 Multiple myeloma in remission: Secondary | ICD-10-CM

## 2017-01-22 DIAGNOSIS — C9000 Multiple myeloma not having achieved remission: Secondary | ICD-10-CM

## 2017-01-22 LAB — BASIC METABOLIC PANEL
ANION GAP: 8 (ref 5–15)
BUN: 22 mg/dL — AB (ref 6–20)
CHLORIDE: 107 mmol/L (ref 101–111)
CO2: 23 mmol/L (ref 22–32)
Calcium: 10.5 mg/dL — ABNORMAL HIGH (ref 8.9–10.3)
Creatinine, Ser: 0.88 mg/dL (ref 0.44–1.00)
GFR calc Af Amer: >60 mL/min (ref >60)
GFR calc non Af Amer: 60 mL/min — ABNORMAL LOW (ref >60)
GLUCOSE: 133 mg/dL — AB (ref 65–99)
POTASSIUM: 4.3 mmol/L (ref 3.5–5.1)
SODIUM: 138 mmol/L (ref 135–145)

## 2017-01-22 LAB — CBC WITH DIFFERENTIAL/PLATELET
Basophils Absolute: 0.1 10*3/uL (ref 0–0.1)
Basophils Relative: 1 %
EOS PCT: 1 %
Eosinophils Absolute: 0.1 10*3/uL (ref 0–0.7)
HEMATOCRIT: 35.8 % (ref 35.0–47.0)
Hemoglobin: 12.9 g/dL (ref 12.0–16.0)
LYMPHS ABS: 1.7 10*3/uL (ref 1.0–3.6)
LYMPHS PCT: 26 %
MCH: 33.8 pg (ref 26.0–34.0)
MCHC: 36.1 g/dL — ABNORMAL HIGH (ref 32.0–36.0)
MCV: 93.7 fL (ref 80.0–100.0)
Monocytes Absolute: 0.7 10*3/uL (ref 0.2–0.9)
Monocytes Relative: 11 %
NEUTROS ABS: 4.1 10*3/uL (ref 1.4–6.5)
Neutrophils Relative %: 61 %
PLATELETS: 217 10*3/uL (ref 150–440)
RBC: 3.82 MIL/uL (ref 3.80–5.20)
RDW: 12.3 % (ref 11.5–14.5)
WBC: 6.6 10*3/uL (ref 3.6–11.0)

## 2017-01-22 MED ORDER — HEPARIN SOD (PORK) LOCK FLUSH 100 UNIT/ML IV SOLN: 500.0000 [IU] | Freq: Once | INTRAVENOUS | Status: DC | PRN

## 2017-01-22 MED ORDER — SODIUM CHLORIDE 0.9 % IV SOLN
INTRAVENOUS | Status: DC
  Administered 2017-01-22: 14:00:00 via INTRAVENOUS
  Filled 2017-01-22: qty 1000

## 2017-01-22 MED ORDER — SODIUM CHLORIDE 0.9 % IV SOLN
3.3000 mg | Freq: Once | INTRAVENOUS | Status: AC
  Administered 2017-01-22: 3.3 mg via INTRAVENOUS
  Filled 2017-01-22: qty 4.13

## 2017-01-23 LAB — KAPPA/LAMBDA LIGHT CHAINS
Kappa free light chain: 11.5 mg/L (ref 3.3–19.4)
Kappa, lambda light chain ratio: 0.01 — ABNORMAL LOW (ref 0.26–1.65)
Lambda free light chains: 1329.2 mg/L — ABNORMAL HIGH (ref 5.7–26.3)

## 2017-01-23 LAB — PROTEIN ELECTROPHORESIS, SERUM
A/G RATIO SPE: 1.7 (ref 0.7–1.7)
ALBUMIN ELP: 3.6 g/dL (ref 2.9–4.4)
ALPHA-1-GLOBULIN: 0.2 g/dL (ref 0.0–0.4)
ALPHA-2-GLOBULIN: 0.6 g/dL (ref 0.4–1.0)
BETA GLOBULIN: 0.8 g/dL (ref 0.7–1.3)
GAMMA GLOBULIN: 0.5 g/dL (ref 0.4–1.8)
Globulin, Total: 2.1 g/dL — ABNORMAL LOW (ref 2.2–3.9)
Total Protein ELP: 5.7 g/dL — ABNORMAL LOW (ref 6.0–8.5)

## 2017-01-23 LAB — IGG, IGA, IGM
IGG (IMMUNOGLOBIN G), SERUM: 582 mg/dL — AB (ref 700–1600)
IgA: 52 mg/dL — ABNORMAL LOW (ref 64–422)
IgM (Immunoglobulin M), Srm: 18 mg/dL — ABNORMAL LOW (ref 26–217)

## 2017-02-15 NOTE — Progress Notes (Signed)
[place]  Telephone:(336) [phone] Fax:(336) [phone]  ID: [name] OB: [birth year]  MR#: [id number]  LDJ#:[id number]  Patient Care Team: Lavera Guise, MD as PCP - General (Internal Medicine)  CHIEF COMPLAINT:  Multiple myeloma in remission.  INTERVAL HISTORY: Patient returns to clinic today for further evaluation and continuation of Zometa. The neuropathy in her feet has resolved. She currently feels well and is asymptomatic. She has no neurologic complaints. She denies any recent fevers or illnesses. She has a good appetite and denies weight loss. She denies any chest pain or shortness of breath. She denies any nausea, vomiting, constipation, or diarrhea.  She has no hematuria, melena, or hematochezia. She has no urinary complaints.  Patient offers no specific complaints today.   REVIEW OF SYSTEMS:   Review of Systems  Constitutional: Negative.  Negative for fever, malaise/fatigue and weight loss.  HENT: Negative for congestion and sore throat.   Respiratory: Negative for cough and shortness of breath.   Cardiovascular: Negative for chest pain, palpitations and leg swelling.  Gastrointestinal: Negative for abdominal pain, blood in stool, constipation, diarrhea, melena, nausea and vomiting.  Genitourinary: Negative.   Musculoskeletal: Negative.   Neurological: Negative for dizziness, tingling, sensory change, weakness and headaches.  Psychiatric/Behavioral: Negative.  The patient is not nervous/anxious and does not have insomnia.     As per HPI. Otherwise, a complete review of systems is negative.  PAST MEDICAL HISTORY: Past Medical History:  Diagnosis Date  . Arthritis    hands  . Cataract   . GERD (gastroesophageal reflux disease)   . Hyperlipemia   . Hypothyroidism   . Multiple myeloma ([place]) 04/18/2016  . Osteoporosis   . Skin cancer    basal cell    PAST SURGICAL HISTORY: Past Surgical History:  Procedure Laterality Date  . BREAST  EXCISIONAL BIOPSY Right 15+ yrs ago   EXCISIONAL - NEG  . CATARACT EXTRACTION W/ INTRAOCULAR LENS IMPLANT    . COLONOSCOPY WITH PROPOFOL N/A 02/21/2015   Procedure: COLONOSCOPY WITH PROPOFOL;  Surgeon: Lucilla Lame, MD;  Location: [facility];  Service: Endoscopy;  Laterality: N/A;  . [place]  . POLYPECTOMY  02/21/2015   Procedure: POLYPECTOMY;  Surgeon: Lucilla Lame, MD;  Location: [facility];  Service: Endoscopy;;    FAMILY HISTORY: Family History  Problem Relation Age of Onset  . Heart disease Mother   . Stroke Father   . Heart disease Brother   . Kidney cancer Neg Hx   . Prostate cancer Neg Hx   . Bladder Cancer Neg Hx   . Breast cancer Neg Hx     ADVANCED DIRECTIVES (Y/N):  N  HEALTH MAINTENANCE: Social History  Substance Use Topics  . Smoking status: Never Smoker  . Smokeless tobacco: Never Used  . Alcohol use No     Colonoscopy:  PAP:  Bone density:  Lipid panel:  No Known Allergies  Current Outpatient Prescriptions  Medication Sig Dispense Refill  . aspirin EC 81 MG tablet Take 81 mg by mouth daily.    . Cyanocobalamin (B-12 PO) Take 1 tablet by mouth daily.    . fexofenadine (ALLEGRA) 30 MG tablet Take 30 mg by mouth daily.     Marland Kitchen levothyroxine (SYNTHROID, LEVOTHROID) 50 MCG tablet Take 50 mcg by mouth daily before breakfast.     . Multiple Vitamins-Minerals (CENTRUM SILVER PO) Take 1 tablet by mouth daily.    . RESTASIS 0.05 % ophthalmic emulsion Place 1 drop into  both eyes daily.     Marland Kitchen triamcinolone (NASACORT ALLERGY 24HR) 55 MCG/ACT AERO nasal inhaler Place 2 sprays into the nose 2 (two) times daily.    Marland Kitchen UNABLE TO FIND Allergy injections once a week    . lenalidomide (REVLIMID) 15 MG capsule Take 1 capsule (15 mg total) by mouth daily. For 21 days with 7 days off. (Patient not taking: Reported on 11/27/2016) 21 capsule 0   No current facility-administered medications for this visit.     OBJECTIVE: Vitals:    02/19/17 1401  BP: 120/63  Pulse: 84  Resp: 20  Temp: (!) 97.3 F (36.3 C)     Body mass index is 24.11 kg/m.    ECOG FS:0 - Asymptomatic  General: Well-developed, well-nourished, no acute distress. Eyes: Pink conjunctiva, anicteric sclera. Lungs: Clear to auscultation bilaterally. Heart: Regular rate and rhythm. No rubs, murmurs, or gallops. Abdomen: Soft, nontender, nondistended. No organomegaly noted, normoactive bowel sounds. Musculoskeletal: No edema, cyanosis, or clubbing. Neuro: Alert, answering all questions appropriately. Cranial nerves grossly intact. Skin: No rashes or petechiae noted. Psych: Normal affect.   LAB RESULTS:  Lab Results  Component Value Date   NA 138 02/19/2017   K 3.6 02/19/2017   CL 109 02/19/2017   CO2 23 02/19/2017   GLUCOSE 137 (H) 02/19/2017   BUN 21 (H) 02/19/2017   CREATININE 0.79 02/19/2017   CALCIUM 10.3 02/19/2017   PROT 6.8 05/07/2016   ALBUMIN 4.4 05/07/2016   AST 19 05/07/2016   ALT 14 05/07/2016   ALKPHOS 104 05/07/2016   BILITOT 0.6 05/07/2016   GFRNONAA >60 02/19/2017   GFRAA >60 02/19/2017    Lab Results  Component Value Date   WBC 7.0 02/19/2017   NEUTROABS 4.6 02/19/2017   HGB 12.7 02/19/2017   HCT 35.2 02/19/2017   MCV 92.7 02/19/2017   PLT 253 02/19/2017   Lab Results  Component Value Date   TOTALPROTELP 6.3 02/19/2017   ALBUMINELP 3.8 02/19/2017   A1GS 0.2 02/19/2017   A2GS 0.7 02/19/2017   BETS 0.9 02/19/2017   GAMS 0.7 02/19/2017   MSPIKE 0.2 (H) 02/19/2017   SPEI Comment 02/19/2017     STUDIES: No results found.  ASSESSMENT:  Multiple myeloma in remission.  PLAN:    1.  Multiple myeloma in remission: Patient's calcium levels had been trending down previously, but are within normal limits today, therefore will proceed with Zometa. Patient's M spike fluctuates between 0.0 and 0.2. Her lambda free light chains seem to be erratic between 848 and 1427, lthough the past several seem to have trended up  slightly. Previously, bone marrow biopsy had 44% plasma cells with normal cytogenetics. Given the results of her metastatic bone survey, her elevated lambda free chains, and her bone marrow biopsy, patient fit the criteria for multiple myeloma. Because of patient's peripheral neuropathy, Revlimid was discontinued.  We may need to restart treatment in the near future though. Maintenance treatment was not started given her neuropathy. Patient initiated monthly Zometa on May 07, 2016. Continue 3.3 mg Zometa every 4 weeks. Return to clinic in 4 weeks for laboratory work and Zometa and then in 8 weeks for further evaluation and continuation of Zometa.  2. Hypercalcemia: Resolved. Secondary to underlying myeloma, Zometa as above. Monitor.  3. Thrombocytopenia: Resolved, monitor.  Patient expressed understanding and was in agreement with this plan. She also understands that She can call clinic at any time with any questions, concerns, or complaints.    Lloyd Huger, MD  02/22/17 12:39 PM

## 2017-02-19 ENCOUNTER — Inpatient Hospital Stay: Payer: Medicare Other

## 2017-02-19 ENCOUNTER — Inpatient Hospital Stay: Payer: Medicare Other | Attending: Oncology | Admitting: Oncology

## 2017-02-19 VITALS — BP 120/63 | HR 84 | Temp 97.3°F | Resp 20 | Wt 131.8 lb

## 2017-02-19 DIAGNOSIS — Z85828 Personal history of other malignant neoplasm of skin: Secondary | ICD-10-CM | POA: Insufficient documentation

## 2017-02-19 DIAGNOSIS — K219 Gastro-esophageal reflux disease without esophagitis: Secondary | ICD-10-CM | POA: Diagnosis not present

## 2017-02-19 DIAGNOSIS — E039 Hypothyroidism, unspecified: Secondary | ICD-10-CM | POA: Insufficient documentation

## 2017-02-19 DIAGNOSIS — C9000 Multiple myeloma not having achieved remission: Secondary | ICD-10-CM

## 2017-02-19 DIAGNOSIS — D039 Melanoma in situ, unspecified: Secondary | ICD-10-CM | POA: Diagnosis not present

## 2017-02-19 DIAGNOSIS — C9001 Multiple myeloma in remission: Secondary | ICD-10-CM | POA: Diagnosis not present

## 2017-02-19 DIAGNOSIS — Z7982 Long term (current) use of aspirin: Secondary | ICD-10-CM | POA: Insufficient documentation

## 2017-02-19 DIAGNOSIS — M1990 Unspecified osteoarthritis, unspecified site: Secondary | ICD-10-CM

## 2017-02-19 DIAGNOSIS — Z79899 Other long term (current) drug therapy: Secondary | ICD-10-CM | POA: Diagnosis not present

## 2017-02-19 DIAGNOSIS — E785 Hyperlipidemia, unspecified: Secondary | ICD-10-CM | POA: Diagnosis not present

## 2017-02-19 LAB — CBC WITH DIFFERENTIAL/PLATELET
BASOS ABS: 0 10*3/uL (ref 0–0.1)
Basophils Relative: 1 %
EOS PCT: 1 %
Eosinophils Absolute: 0 10*3/uL (ref 0–0.7)
HEMATOCRIT: 35.2 % (ref 35.0–47.0)
Hemoglobin: 12.7 g/dL (ref 12.0–16.0)
LYMPHS ABS: 1.7 10*3/uL (ref 1.0–3.6)
LYMPHS PCT: 24 %
MCH: 33.4 pg (ref 26.0–34.0)
MCHC: 36 g/dL (ref 32.0–36.0)
MCV: 92.7 fL (ref 80.0–100.0)
MONO ABS: 0.7 10*3/uL (ref 0.2–0.9)
Monocytes Relative: 9 %
NEUTROS ABS: 4.6 10*3/uL (ref 1.4–6.5)
Neutrophils Relative %: 65 %
PLATELETS: 253 10*3/uL (ref 150–440)
RBC: 3.8 MIL/uL (ref 3.80–5.20)
RDW: 12.2 % (ref 11.5–14.5)
WBC: 7 10*3/uL (ref 3.6–11.0)

## 2017-02-19 LAB — BASIC METABOLIC PANEL
ANION GAP: 6 (ref 5–15)
BUN: 21 mg/dL — AB (ref 6–20)
CHLORIDE: 109 mmol/L (ref 101–111)
CO2: 23 mmol/L (ref 22–32)
Calcium: 10.3 mg/dL (ref 8.9–10.3)
Creatinine, Ser: 0.79 mg/dL (ref 0.44–1.00)
GFR calc Af Amer: >60 mL/min (ref >60)
GFR calc non Af Amer: >60 mL/min (ref >60)
GLUCOSE: 137 mg/dL — AB (ref 65–99)
POTASSIUM: 3.6 mmol/L (ref 3.5–5.1)
Sodium: 138 mmol/L (ref 135–145)

## 2017-02-19 MED ORDER — SODIUM CHLORIDE 0.9 % IV SOLN
INTRAVENOUS | Status: DC
  Administered 2017-02-19: 15:00:00 via INTRAVENOUS
  Filled 2017-02-19: qty 1000

## 2017-02-19 MED ORDER — ZOLEDRONIC ACID 4 MG/5ML IV CONC
3.3000 mg | Freq: Once | INTRAVENOUS | Status: AC
  Administered 2017-02-19: 3.3 mg via INTRAVENOUS
  Filled 2017-02-19: qty 4.13

## 2017-02-19 NOTE — Progress Notes (Signed)
Patient denies any concerns today.  

## 2017-02-20 ENCOUNTER — Other Ambulatory Visit: Payer: Self-pay | Admitting: Nurse Practitioner

## 2017-02-20 DIAGNOSIS — Z1231 Encounter for screening mammogram for malignant neoplasm of breast: Secondary | ICD-10-CM

## 2017-02-20 LAB — KAPPA/LAMBDA LIGHT CHAINS
KAPPA FREE LGHT CHN: 9.7 mg/L (ref 3.3–19.4)
KAPPA, LAMDA LIGHT CHAIN RATIO: 0.01 — AB (ref 0.26–1.65)
LAMDA FREE LIGHT CHAINS: 1427.8 mg/L — AB (ref 5.7–26.3)

## 2017-02-20 LAB — PROTEIN ELECTROPHORESIS, SERUM
A/G Ratio: 1.5 (ref 0.7–1.7)
Albumin ELP: 3.8 g/dL (ref 2.9–4.4)
Alpha-1-Globulin: 0.2 g/dL (ref 0.0–0.4)
Alpha-2-Globulin: 0.7 g/dL (ref 0.4–1.0)
BETA GLOBULIN: 0.9 g/dL (ref 0.7–1.3)
GAMMA GLOBULIN: 0.7 g/dL (ref 0.4–1.8)
Globulin, Total: 2.5 g/dL (ref 2.2–3.9)
M-SPIKE, %: 0.2 g/dL — AB
TOTAL PROTEIN ELP: 6.3 g/dL (ref 6.0–8.5)

## 2017-02-20 LAB — IGG, IGA, IGM
IGA: 33 mg/dL — AB (ref 64–422)
IGM (IMMUNOGLOBULIN M), SRM: 14 mg/dL — AB (ref 26–217)
IgG (Immunoglobin G), Serum: 565 mg/dL — ABNORMAL LOW (ref 700–1600)

## 2017-02-28 ENCOUNTER — Ambulatory Visit (INDEPENDENT_AMBULATORY_CARE_PROVIDER_SITE_OTHER): Payer: Medicare Other | Admitting: Surgery

## 2017-02-28 ENCOUNTER — Telehealth: Payer: Self-pay

## 2017-02-28 ENCOUNTER — Encounter: Payer: Self-pay | Admitting: Surgery

## 2017-02-28 VITALS — BP 145/76 | HR 82 | Temp 97.7°F | Ht 62.0 in | Wt 130.2 lb

## 2017-02-28 DIAGNOSIS — K8020 Calculus of gallbladder without cholecystitis without obstruction: Secondary | ICD-10-CM | POA: Diagnosis not present

## 2017-02-28 NOTE — Telephone Encounter (Signed)
Oncology clearance faxed to Dr.Finnegan at this time.  Medical clearance faxed to Dr.Fozia Humphrey Rolls.

## 2017-02-28 NOTE — Patient Instructions (Signed)
You have requested to have your Gallbladder removed. We will arrange this to be done on 03/14/17 at [facility] with [facility].  You will be off from work for approximately 1-2 weeks depending on your recovery.   Please avoid greasy and fried foods if at all possible prior to your scheduled surgery to decrease symptoms until then.  Please see the [facility]) pre-care form you have been given today.  If you have any questions or concerns please call our office.

## 2017-02-28 NOTE — Progress Notes (Signed)
Surgical Consultation  02/28/2017  Rhonda [name] is an 83 y.o. female.   CC: Gallstones  HPI: This patient referred by Dr. Darrow Bussing for gallstones. She apparently has multiple myeloma in remission. Patient describes over 1 year of right upper quadrant pain not necessarily associated C aided with fatty food intolerance. She's had nausea and vomiting with these attacks which typically last 2 hours. They're almost always at night. They're almost always following a meal but not necessarily a fatty meal. She has no jaundice or acholic stools no fevers or chills. Attacks are twice a week at this point  Past Medical History:  Diagnosis Date  . Arthritis    hands  . Benign neoplasm of ascending colon   . Cataract   . GERD (gastroesophageal reflux disease)   . Hyperlipemia   . Hypertension 01/31/2015  . Hypothyroidism   . Multiple myeloma ([place]) 04/18/2016  . Multiple myeloma in remission ([place]) 04/18/2016  . Osteoporosis   . Skin cancer    basal cell    Past Surgical History:  Procedure Laterality Date  . BREAST EXCISIONAL BIOPSY Right 15+ yrs ago   EXCISIONAL - NEG  . CATARACT EXTRACTION W/ INTRAOCULAR LENS IMPLANT    . COLONOSCOPY WITH PROPOFOL N/A 02/21/2015   Procedure: COLONOSCOPY WITH PROPOFOL;  Surgeon: Lucilla Lame, MD;  Location: [facility];  Service: Endoscopy;  Laterality: N/A;  . [place]  . POLYPECTOMY  02/21/2015   Procedure: POLYPECTOMY;  Surgeon: Lucilla Lame, MD;  Location: [facility];  Service: Endoscopy;;    Mother had gallstones. Family History  Problem Relation Age of Onset  . Heart disease Mother   . Stroke Father   . Heart disease Brother   . Kidney cancer Neg Hx   . Prostate cancer Neg Hx   . Bladder Cancer Neg Hx   . Breast cancer Neg Hx     Social History:  reports that she has never smoked. She has never used smokeless tobacco. She reports that she does not drink alcohol or use drugs. She is retired from the school  system. Allergies: No Known Allergies  Medications reviewed.   Review of Systems:   Review of Systems  Constitutional: Negative for chills, fever and weight loss.  HENT: Negative.   Eyes: Negative.   Respiratory: Negative.   Cardiovascular: Negative.   Gastrointestinal: Positive for abdominal pain, nausea and vomiting. Negative for blood in stool, constipation, diarrhea and heartburn.  Genitourinary: Negative.   Musculoskeletal: Negative.   Skin: Negative.   Neurological: Negative.   Endo/Heme/Allergies: Negative.   Psychiatric/Behavioral: Negative.      Physical Exam:  There were no vitals taken for this visit.  Physical Exam  Constitutional: She is well-developed, well-nourished, and in no distress. No distress.  HENT:  Head: Normocephalic and atraumatic.  Eyes: Pupils are equal, round, and reactive to light. Right eye exhibits no discharge. Left eye exhibits no discharge. No scleral icterus.  Neck: Normal range of motion. No JVD present.  Lymphadenopathy:    She has no cervical adenopathy.  Skin: Skin is warm and dry. No rash noted. She is not diaphoretic. No erythema.  Psychiatric: Mood and affect normal.  Vitals reviewed.     No results found for this or any previous visit (from the past 48 hour(s)). No results found.  Assessment/Plan:  This patient had an ultrasound and Dr. Marella Bile office. Those reports are not available at this time. We're attempting to obtain them. Patient was told that she  had stones on her ultrasound. (This was later confirmed by the written report which was obtained) there are no LFTs for review.  This a patient with likely recurrent episodic right upper quadrant pain associated with eating that she is suggestive of biliary colic. I am recommending laparoscopic cholecystectomy for control of her symptoms. The rationale for this is been discussed the options of observation of been reviewed and the risks of bleeding infection recurrence of  symptoms failure to resolve her symptoms conversion to an open procedure bile duct damage bile duct leak retained common bile duct stone any of which could require further surgery and/or ERCP stent and papillotomy were all discussed she understood and agreed to proceed  Florene Glen, MD, FACS

## 2017-03-01 ENCOUNTER — Telehealth: Payer: Self-pay

## 2017-03-01 ENCOUNTER — Telehealth: Payer: Self-pay | Admitting: Surgery

## 2017-03-01 NOTE — Telephone Encounter (Signed)
ONCOLOGY CLEARANCE OBTAIN AND APPROVED.

## 2017-03-01 NOTE — Telephone Encounter (Signed)
Pt advised of pre op date/time and sx date. Sx: 03/14/17 with Dr Maeola Sarah cholecystectomy.   Pre op: 03/04/17 @ 1:00pm--Office.  Patient made aware to call [phone], between 1-3:00pm the day before surgery, to find out what time to arrive.

## 2017-03-04 ENCOUNTER — Telehealth: Payer: Self-pay

## 2017-03-04 ENCOUNTER — Encounter
Admission: RE | Admit: 2017-03-04 | Discharge: 2017-03-04 | Disposition: A | Discharge status: Home / Self Care | Payer: Medicare Other | Source: Ambulatory Visit | Attending: Surgery | Admitting: Surgery

## 2017-03-04 DIAGNOSIS — Z01810 Encounter for preprocedural cardiovascular examination: Secondary | ICD-10-CM

## 2017-03-04 DIAGNOSIS — Z85038 Personal history of other malignant neoplasm of large intestine: Secondary | ICD-10-CM | POA: Diagnosis not present

## 2017-03-04 DIAGNOSIS — Z01812 Encounter for preprocedural laboratory examination: Secondary | ICD-10-CM

## 2017-03-04 DIAGNOSIS — R112 Nausea with vomiting, unspecified: Secondary | ICD-10-CM | POA: Insufficient documentation

## 2017-03-04 DIAGNOSIS — Z1231 Encounter for screening mammogram for malignant neoplasm of breast: Secondary | ICD-10-CM | POA: Diagnosis not present

## 2017-03-04 DIAGNOSIS — Z9889 Other specified postprocedural states: Secondary | ICD-10-CM

## 2017-03-04 DIAGNOSIS — C9001 Multiple myeloma in remission: Secondary | ICD-10-CM | POA: Insufficient documentation

## 2017-03-04 DIAGNOSIS — D126 Benign neoplasm of colon, unspecified: Secondary | ICD-10-CM

## 2017-03-04 DIAGNOSIS — K8080 Other cholelithiasis without obstruction: Secondary | ICD-10-CM | POA: Insufficient documentation

## 2017-03-04 DIAGNOSIS — M13842 Other specified arthritis, left hand: Secondary | ICD-10-CM

## 2017-03-04 DIAGNOSIS — M13841 Other specified arthritis, right hand: Secondary | ICD-10-CM

## 2017-03-04 DIAGNOSIS — E785 Hyperlipidemia, unspecified: Secondary | ICD-10-CM | POA: Insufficient documentation

## 2017-03-04 DIAGNOSIS — I10 Essential (primary) hypertension: Secondary | ICD-10-CM

## 2017-03-04 DIAGNOSIS — R1011 Right upper quadrant pain: Secondary | ICD-10-CM

## 2017-03-04 DIAGNOSIS — K219 Gastro-esophageal reflux disease without esophagitis: Secondary | ICD-10-CM

## 2017-03-04 DIAGNOSIS — Z823 Family history of stroke: Secondary | ICD-10-CM

## 2017-03-04 DIAGNOSIS — Z8249 Family history of ischemic heart disease and other diseases of the circulatory system: Secondary | ICD-10-CM | POA: Insufficient documentation

## 2017-03-04 DIAGNOSIS — M19042 Primary osteoarthritis, left hand: Secondary | ICD-10-CM | POA: Diagnosis not present

## 2017-03-04 DIAGNOSIS — M19041 Primary osteoarthritis, right hand: Secondary | ICD-10-CM | POA: Diagnosis not present

## 2017-03-04 LAB — CBC WITH DIFFERENTIAL/PLATELET
Basophils Absolute: 0 10*3/uL (ref 0–0.1)
Basophils Relative: 1 %
EOS ABS: 0 10*3/uL (ref 0–0.7)
EOS PCT: 1 %
HCT: 36.1 % (ref 35.0–47.0)
Hemoglobin: 13 g/dL (ref 12.0–16.0)
LYMPHS ABS: 1.4 10*3/uL (ref 1.0–3.6)
Lymphocytes Relative: 23 %
MCH: 33.6 pg (ref 26.0–34.0)
MCHC: 35.9 g/dL (ref 32.0–36.0)
MCV: 93.7 fL (ref 80.0–100.0)
MONO ABS: 0.6 10*3/uL (ref 0.2–0.9)
MONOS PCT: 10 %
Neutro Abs: 4 10*3/uL (ref 1.4–6.5)
Neutrophils Relative %: 65 %
PLATELETS: 204 10*3/uL (ref 150–440)
RBC: 3.85 MIL/uL (ref 3.80–5.20)
RDW: 12.7 % (ref 11.5–14.5)
WBC: 6.1 10*3/uL (ref 3.6–11.0)

## 2017-03-04 LAB — COMPREHENSIVE METABOLIC PANEL
ALK PHOS: 49 U/L (ref 38–126)
ALT: 16 U/L (ref 14–54)
ANION GAP: 9 (ref 5–15)
AST: 24 U/L (ref 15–41)
Albumin: 4.2 g/dL (ref 3.5–5.0)
BUN: 17 mg/dL (ref 6–20)
CALCIUM: 10.3 mg/dL (ref 8.9–10.3)
CHLORIDE: 104 mmol/L (ref 101–111)
CO2: 23 mmol/L (ref 22–32)
Creatinine, Ser: 0.9 mg/dL (ref 0.44–1.00)
GFR calc non Af Amer: 58 mL/min — ABNORMAL LOW (ref >60)
Glucose, Bld: 124 mg/dL — ABNORMAL HIGH (ref 65–99)
Potassium: 3.2 mmol/L — ABNORMAL LOW (ref 3.5–5.1)
SODIUM: 136 mmol/L (ref 135–145)
Total Bilirubin: 0.5 mg/dL (ref 0.3–1.2)
Total Protein: 6.5 g/dL (ref 6.5–8.1)

## 2017-03-04 MED ORDER — POTASSIUM CHLORIDE ER 20 MEQ PO TBCR: 1.0000 | EXTENDED_RELEASE_TABLET | Freq: Two times a day (BID) | ORAL | 0 refills | Status: DC

## 2017-03-04 NOTE — Patient Instructions (Signed)
Your procedure is scheduled on: Thursday 03/14/17 Report to [facility]. 2ND FLOOR MEDICAL MALL ENTRANCE. To find out your arrival time please call [phone] between 1PM - 3PM on Wednesday 03/13/17.  Remember: Instructions that are not followed completely may result in serious medical risk, up to and including death, or upon the discretion of your surgeon and anesthesiologist your surgery may need to be rescheduled.    __X__ 1. Do not eat anything after midnight the night before your    procedure.  No gum chewing or hard candies.  You may drink clear   liquids up to 2 hours before you are scheduled to arrive at the   hospital for your procedure. Do not drink clear liquids within 2   hours of scheduled arrival to the hospital as this may lead to your   procedure being delayed or rescheduled.       Clear liquids include:   Water or Apple juice without pulp   Clear carbohydrate beverage such as Clearfast or Gatorade   Black coffee or Clear Tea (no milk, no creamer, do not add anything   to the coffee or tea)    Diabetics should only drink water   __X__ 2. No Alcohol for 24 hours before or after surgery.   ____ 3. Bring all medications with you on the day of surgery if instructed.    __X__ 4. Notify your doctor if there is any change in your medical condition     (cold, fever, infections).             __X___5. No smoking within 24 hours of your surgery.     Do not wear jewelry, make-up, hairpins, clips or nail polish.  Do not wear lotions, powders, or perfumes.   Do not shave 48 hours prior to surgery. Men may shave face and neck.  Do not bring valuables to the hospital.    [facility] is not responsible for any belongings or valuables.               Contacts, dentures or bridgework may not be worn into surgery.  Leave your suitcase in the car. After surgery it may be brought to your room.  For patients admitted to the hospital, discharge time is determined by your                 treatment team.   Patients discharged the day of surgery will not be allowed to drive home.   Please read over the following fact sheets that you were given:   MRSA Information   __X__ Take these medicines the morning of surgery with A SIP OF WATER:    1. LEVOTHYROXINE  2.   3.   4.  5.  6.  ____ Fleet Enema (as directed)   __X__ Use CHG Soap/SAGE wipes as directed  ____ Use inhalers on the day of surgery  ____ Stop metformin 2 days prior to surgery    ____ Take 1/2 of usual insulin dose the night before surgery and none on the morning of surgery.   __X__ Stop Coumadin/Plavix/aspirin on ALREADY STOPPED  __X__ Stop Anti-inflammatories such as Advil, Aleve, Ibuprofen, Motrin, Naproxen, Naprosyn, Goodies,powder, or aspirin products.  OK to take Tylenol.   __X__ Stop supplements, Vitamin E, Fish Oil until after surgery.    ____ Bring C-Pap to the hospital.

## 2017-03-04 NOTE — Pre-Procedure Instructions (Signed)
Spoke to [place] at [employer] office regarding patient potassium, results faxed. Patient will need supplement and will recheck am of surgery.

## 2017-03-04 NOTE — Telephone Encounter (Signed)
Called patient to let her know that her Potassium was low, therefore, she is needing to start taking Potassium Chloride 20 meq 1 tablet twice a day for five days. I also told her that I had sent her prescription to her [facility] and should start taking it tomorrow. Patient understood and had no further questions.

## 2017-03-05 ENCOUNTER — Telehealth: Payer: Self-pay

## 2017-03-05 NOTE — Telephone Encounter (Signed)
Medical Clearance obtained from Dr.Fozia at this time and will be scanned under Media.

## 2017-03-07 ENCOUNTER — Ambulatory Visit
Admission: RE | Admit: 2017-03-07 | Discharge: 2017-03-07 | Disposition: A | Discharge status: Home / Self Care | Payer: Medicare Other | Source: Ambulatory Visit | Attending: Nurse Practitioner | Admitting: Nurse Practitioner

## 2017-03-07 DIAGNOSIS — E785 Hyperlipidemia, unspecified: Secondary | ICD-10-CM | POA: Insufficient documentation

## 2017-03-07 DIAGNOSIS — M19042 Primary osteoarthritis, left hand: Secondary | ICD-10-CM | POA: Insufficient documentation

## 2017-03-07 DIAGNOSIS — K219 Gastro-esophageal reflux disease without esophagitis: Secondary | ICD-10-CM | POA: Insufficient documentation

## 2017-03-07 DIAGNOSIS — Z9889 Other specified postprocedural states: Secondary | ICD-10-CM | POA: Insufficient documentation

## 2017-03-07 DIAGNOSIS — R1011 Right upper quadrant pain: Secondary | ICD-10-CM | POA: Insufficient documentation

## 2017-03-07 DIAGNOSIS — Z1231 Encounter for screening mammogram for malignant neoplasm of breast: Secondary | ICD-10-CM

## 2017-03-07 DIAGNOSIS — K8080 Other cholelithiasis without obstruction: Secondary | ICD-10-CM | POA: Insufficient documentation

## 2017-03-07 DIAGNOSIS — I10 Essential (primary) hypertension: Secondary | ICD-10-CM | POA: Insufficient documentation

## 2017-03-07 DIAGNOSIS — Z01812 Encounter for preprocedural laboratory examination: Secondary | ICD-10-CM | POA: Insufficient documentation

## 2017-03-07 DIAGNOSIS — Z8249 Family history of ischemic heart disease and other diseases of the circulatory system: Secondary | ICD-10-CM | POA: Insufficient documentation

## 2017-03-07 DIAGNOSIS — Z823 Family history of stroke: Secondary | ICD-10-CM | POA: Insufficient documentation

## 2017-03-07 DIAGNOSIS — Z01810 Encounter for preprocedural cardiovascular examination: Secondary | ICD-10-CM | POA: Insufficient documentation

## 2017-03-07 DIAGNOSIS — M19041 Primary osteoarthritis, right hand: Secondary | ICD-10-CM | POA: Insufficient documentation

## 2017-03-07 DIAGNOSIS — C9001 Multiple myeloma in remission: Secondary | ICD-10-CM | POA: Insufficient documentation

## 2017-03-07 DIAGNOSIS — Z85038 Personal history of other malignant neoplasm of large intestine: Secondary | ICD-10-CM | POA: Insufficient documentation

## 2017-03-13 MED ORDER — CEFAZOLIN SODIUM-DEXTROSE 2-4 GM/100ML-% IV SOLN
2.0000 g | INTRAVENOUS | Status: AC
  Administered 2017-03-14: 2 g via INTRAVENOUS

## 2017-03-14 ENCOUNTER — Encounter
Admission: RE | Disposition: A | Discharge status: Home / Self Care | Payer: Self-pay | Source: Ambulatory Visit | Attending: Surgery

## 2017-03-14 ENCOUNTER — Ambulatory Visit: Payer: Medicare Other | Admitting: Anesthesiology

## 2017-03-14 ENCOUNTER — Encounter: Payer: Self-pay | Admitting: *Deleted

## 2017-03-14 ENCOUNTER — Ambulatory Visit
Admission: RE | Admit: 2017-03-14 | Discharge: 2017-03-14 | Disposition: A | Discharge status: Home / Self Care | Payer: Medicare Other | Source: Ambulatory Visit | Attending: Surgery | Admitting: Surgery

## 2017-03-14 DIAGNOSIS — Z7982 Long term (current) use of aspirin: Secondary | ICD-10-CM | POA: Diagnosis not present

## 2017-03-14 DIAGNOSIS — K8020 Calculus of gallbladder without cholecystitis without obstruction: Secondary | ICD-10-CM | POA: Diagnosis not present

## 2017-03-14 DIAGNOSIS — K8050 Calculus of bile duct without cholangitis or cholecystitis without obstruction: Secondary | ICD-10-CM | POA: Diagnosis present

## 2017-03-14 DIAGNOSIS — Z79899 Other long term (current) drug therapy: Secondary | ICD-10-CM | POA: Insufficient documentation

## 2017-03-14 DIAGNOSIS — E039 Hypothyroidism, unspecified: Secondary | ICD-10-CM | POA: Insufficient documentation

## 2017-03-14 DIAGNOSIS — K219 Gastro-esophageal reflux disease without esophagitis: Secondary | ICD-10-CM | POA: Insufficient documentation

## 2017-03-14 DIAGNOSIS — K8010 Calculus of gallbladder with chronic cholecystitis without obstruction: Secondary | ICD-10-CM | POA: Insufficient documentation

## 2017-03-14 DIAGNOSIS — I10 Essential (primary) hypertension: Secondary | ICD-10-CM | POA: Insufficient documentation

## 2017-03-14 HISTORY — PX: CHOLECYSTECTOMY: SHX55

## 2017-03-14 SURGERY — LAPAROSCOPIC CHOLECYSTECTOMY
Anesthesia: General | Wound class: Clean Contaminated

## 2017-03-14 MED ORDER — HEPARIN SODIUM (PORCINE) 5000 UNIT/ML IJ SOLN
INTRAMUSCULAR | Status: AC
  Administered 2017-03-14: 5000 [IU] via SUBCUTANEOUS
  Filled 2017-03-14: qty 1

## 2017-03-14 MED ORDER — KETOROLAC TROMETHAMINE 30 MG/ML IJ SOLN
INTRAMUSCULAR | Status: DC | PRN
Start: 2017-03-14 — End: 2017-03-14
  Administered 2017-03-14: 30 mg via INTRAVENOUS

## 2017-03-14 MED ORDER — DEXAMETHASONE SODIUM PHOSPHATE 10 MG/ML IJ SOLN
INTRAMUSCULAR | Status: DC | PRN
  Administered 2017-03-14: 10 mg via INTRAVENOUS

## 2017-03-14 MED ORDER — DEXAMETHASONE SODIUM PHOSPHATE 10 MG/ML IJ SOLN
INTRAMUSCULAR | Status: AC
  Filled 2017-03-14: qty 1

## 2017-03-14 MED ORDER — LACTATED RINGERS IV SOLN
INTRAVENOUS | Status: DC
  Administered 2017-03-14: 10:00:00 via INTRAVENOUS

## 2017-03-14 MED ORDER — FAMOTIDINE 20 MG PO TABS
ORAL_TABLET | ORAL | Status: AC
  Administered 2017-03-14: 20 mg via ORAL
  Filled 2017-03-14: qty 1

## 2017-03-14 MED ORDER — BUPIVACAINE-EPINEPHRINE (PF) 0.25% -1:200000 IJ SOLN
INTRAMUSCULAR | Status: DC | PRN
  Administered 2017-03-14: 30 mL via PERINEURAL

## 2017-03-14 MED ORDER — HYDROCODONE-ACETAMINOPHEN 5-300 MG PO TABS: 1.0000 | ORAL_TABLET | ORAL | 0 refills | Status: DC | PRN

## 2017-03-14 MED ORDER — CHLORHEXIDINE GLUCONATE CLOTH 2 % EX PADS: 6.0000 | MEDICATED_PAD | Freq: Once | CUTANEOUS | Status: DC

## 2017-03-14 MED ORDER — FENTANYL CITRATE (PF) 100 MCG/2ML IJ SOLN
INTRAMUSCULAR | Status: DC | PRN
  Administered 2017-03-14 (×2): 50 ug via INTRAVENOUS

## 2017-03-14 MED ORDER — ESMOLOL HCL 100 MG/10ML IV SOLN
INTRAVENOUS | Status: DC | PRN
  Administered 2017-03-14 (×2): 20 mg via INTRAVENOUS

## 2017-03-14 MED ORDER — FENTANYL CITRATE (PF) 100 MCG/2ML IJ SOLN
INTRAMUSCULAR | Status: AC
  Filled 2017-03-14: qty 2

## 2017-03-14 MED ORDER — ACETAMINOPHEN 10 MG/ML IV SOLN
INTRAVENOUS | Status: DC | PRN
  Administered 2017-03-14: 1000 mg via INTRAVENOUS

## 2017-03-14 MED ORDER — FENTANYL CITRATE (PF) 100 MCG/2ML IJ SOLN: 25.0000 ug | INTRAMUSCULAR | Status: DC | PRN

## 2017-03-14 MED ORDER — ACETAMINOPHEN 10 MG/ML IV SOLN
INTRAVENOUS | Status: AC
  Filled 2017-03-14: qty 100

## 2017-03-14 MED ORDER — FAMOTIDINE 20 MG PO TABS
20.0000 mg | ORAL_TABLET | Freq: Once | ORAL | Status: AC
  Administered 2017-03-14: 20 mg via ORAL

## 2017-03-14 MED ORDER — LACTATED RINGERS IV SOLN
INTRAVENOUS | Status: DC | PRN
  Administered 2017-03-14: 11:00:00 via INTRAVENOUS

## 2017-03-14 MED ORDER — BUPIVACAINE-EPINEPHRINE (PF) 0.25% -1:200000 IJ SOLN
INTRAMUSCULAR | Status: AC
  Filled 2017-03-14: qty 30

## 2017-03-14 MED ORDER — LIDOCAINE HCL (CARDIAC) 20 MG/ML IV SOLN
INTRAVENOUS | Status: DC | PRN
  Administered 2017-03-14: 100 mg via INTRAVENOUS

## 2017-03-14 MED ORDER — HYDROCODONE-ACETAMINOPHEN 5-325 MG PO TABS
1.0000 | ORAL_TABLET | ORAL | Status: DC | PRN
  Administered 2017-03-14: 1 via ORAL

## 2017-03-14 MED ORDER — HYDROCODONE-ACETAMINOPHEN 5-325 MG PO TABS
ORAL_TABLET | ORAL | Status: AC
  Administered 2017-03-14: 1 via ORAL
  Filled 2017-03-14: qty 1

## 2017-03-14 MED ORDER — PROPOFOL 10 MG/ML IV BOLUS
INTRAVENOUS | Status: DC | PRN
  Administered 2017-03-14: 30 mg via INTRAVENOUS
  Administered 2017-03-14: 100 mg via INTRAVENOUS

## 2017-03-14 MED ORDER — SUGAMMADEX SODIUM 500 MG/5ML IV SOLN
INTRAVENOUS | Status: AC
  Filled 2017-03-14: qty 5

## 2017-03-14 MED ORDER — KETOROLAC TROMETHAMINE 30 MG/ML IJ SOLN
INTRAMUSCULAR | Status: AC
  Filled 2017-03-14: qty 1

## 2017-03-14 MED ORDER — HEPARIN SODIUM (PORCINE) 5000 UNIT/ML IJ SOLN
5000.0000 [IU] | Freq: Once | INTRAMUSCULAR | Status: AC
  Administered 2017-03-14: 5000 [IU] via SUBCUTANEOUS

## 2017-03-14 MED ORDER — CEFAZOLIN SODIUM-DEXTROSE 2-4 GM/100ML-% IV SOLN
INTRAVENOUS | Status: AC
Start: 2017-03-14 — End: 2017-03-14
  Filled 2017-03-14: qty 100

## 2017-03-14 MED ORDER — ONDANSETRON HCL 4 MG/2ML IJ SOLN
INTRAMUSCULAR | Status: AC
  Filled 2017-03-14: qty 2

## 2017-03-14 MED ORDER — ATROPINE SULFATE 1 MG/10ML IJ SOSY
PREFILLED_SYRINGE | INTRAMUSCULAR | Status: AC
  Filled 2017-03-14: qty 10

## 2017-03-14 MED ORDER — ONDANSETRON HCL 4 MG/2ML IJ SOLN
INTRAMUSCULAR | Status: DC | PRN
  Administered 2017-03-14: 4 mg via INTRAVENOUS

## 2017-03-14 MED ORDER — ONDANSETRON HCL 4 MG/2ML IJ SOLN: 4.0000 mg | Freq: Once | INTRAMUSCULAR | Status: DC | PRN

## 2017-03-14 SURGICAL SUPPLY — 42 items
ADHESIVE MASTISOL STRL (MISCELLANEOUS) ×2 IMPLANT
APPLIER CLIP ROT 10 11.4 M/L (STAPLE) ×2
BLADE SURG SZ11 CARB STEEL (BLADE) ×2 IMPLANT
CANISTER SUCT 1200ML W/VALVE (MISCELLANEOUS) ×2 IMPLANT
CATH CHOLANGI 4FR 420404F (CATHETERS) IMPLANT
CHLORAPREP W/TINT 26ML (MISCELLANEOUS) ×2 IMPLANT
CLIP APPLIE ROT 10 11.4 M/L (STAPLE) ×1 IMPLANT
CONRAY 60ML FOR OR (MISCELLANEOUS) IMPLANT
DRAPE C-ARM XRAY 36X54 (DRAPES) IMPLANT
ELECT REM PT RETURN 9FT ADLT (ELECTROSURGICAL) ×2
ELECTRODE REM PT RTRN 9FT ADLT (ELECTROSURGICAL) ×1 IMPLANT
GAUZE SPONGE NON-WVN 2X2 STRL (MISCELLANEOUS) ×4 IMPLANT
GLOVE BIO SURGEON STRL SZ8 (GLOVE) ×10 IMPLANT
GOWN STRL REUS W/ TWL LRG LVL3 (GOWN DISPOSABLE) ×3 IMPLANT
GOWN STRL REUS W/TWL LRG LVL3 (GOWN DISPOSABLE) ×3
IRRIGATION STRYKERFLOW (MISCELLANEOUS) ×1 IMPLANT
IRRIGATOR STRYKERFLOW (MISCELLANEOUS) ×2
IV CATH ANGIO 12GX3 LT BLUE (NEEDLE) ×2 IMPLANT
IV NS 1000ML (IV SOLUTION) ×1
IV NS 1000ML BAXH (IV SOLUTION) ×1 IMPLANT
JACKSON PRATT 10 (INSTRUMENTS) IMPLANT
KIT RM TURNOVER STRD PROC AR (KITS) ×2 IMPLANT
LABEL OR SOLS (LABEL) ×2 IMPLANT
NDL SAFETY 22GX1.5 (NEEDLE) ×2 IMPLANT
NEEDLE VERESS 14GA 120MM (NEEDLE) ×2 IMPLANT
NS IRRIG 500ML POUR BTL (IV SOLUTION) ×2 IMPLANT
PACK LAP CHOLECYSTECTOMY (MISCELLANEOUS) ×2 IMPLANT
POUCH SPECIMEN RETRIEVAL 10MM (ENDOMECHANICALS) ×2 IMPLANT
SCISSORS METZENBAUM CVD 33 (INSTRUMENTS) ×2 IMPLANT
SLEEVE ENDOPATH XCEL 5M (ENDOMECHANICALS) ×4 IMPLANT
SPONGE LAP 18X18 5 PK (GAUZE/BANDAGES/DRESSINGS) ×2 IMPLANT
SPONGE VERSALON 2X2 STRL (MISCELLANEOUS) ×4
SPONGE VERSALON 4X4 4PLY (MISCELLANEOUS) IMPLANT
STRIP CLOSURE SKIN 1/2X4 (GAUZE/BANDAGES/DRESSINGS) ×2 IMPLANT
SUT MNCRL 4-0 (SUTURE) ×1
SUT MNCRL 4-0 27XMFL (SUTURE) ×1
SUT VICRYL 0 AB UR-6 (SUTURE) ×2 IMPLANT
SUTURE MNCRL 4-0 27XMF (SUTURE) ×1 IMPLANT
SYR 20CC LL (SYRINGE) ×2 IMPLANT
TROCAR XCEL NON-BLD 11X100MML (ENDOMECHANICALS) ×2 IMPLANT
TROCAR XCEL NON-BLD 5MMX100MML (ENDOMECHANICALS) ×2 IMPLANT
TUBING INSUFFLATOR HI FLOW (MISCELLANEOUS) ×2 IMPLANT

## 2017-03-14 NOTE — Anesthesia Preprocedure Evaluation (Addendum)
Anesthesia Evaluation  Patient identified by MRN, date of birth, ID band Patient awake    Reviewed: Allergy & Precautions, NPO status , Patient's Chart, lab work & pertinent test results  History of Anesthesia Complications (+) PSEUDOCHOLINESTERASE DEFICIENCYNegative for: history of anesthetic complications  Airway Mallampati: II       Dental   Pulmonary neg sleep apnea, neg COPD,           Cardiovascular hypertension, Pt. on medications (-) Past MI and (-) CHF (-) dysrhythmias (-) Valvular Problems/Murmurs     Neuro/Psych neg Seizures    GI/Hepatic Neg liver ROS, GERD  Poorly Controlled,  Endo/Other  neg diabetesHypothyroidism   Renal/GU negative Renal ROS     Musculoskeletal   Abdominal   Peds  Hematology   Anesthesia Other Findings   Reproductive/Obstetrics                            Anesthesia Physical Anesthesia Plan  ASA: II  Anesthesia Plan: General   Post-op Pain Management:    Induction: Intravenous  PONV Risk Score and Plan: 3 and Ondansetron, Dexamethasone, Midazolam and Propofol infusion  Airway Management Planned: Oral ETT  Additional Equipment:   Intra-op Plan:   Post-operative Plan:   Informed Consent: I have reviewed the patients History and Physical, chart, labs and discussed the procedure including the risks, benefits and alternatives for the proposed anesthesia with the patient or authorized representative who has indicated his/her understanding and acceptance.     Plan Discussed with:   Anesthesia Plan Comments:         Anesthesia Quick Evaluation

## 2017-03-14 NOTE — Anesthesia Post-op Follow-up Note (Signed)
Anesthesia QCDR form completed.        

## 2017-03-14 NOTE — Progress Notes (Signed)
Preoperative Review   Patient is met in the preoperative holding area. The history is reviewed in the chart and with the patient. I personally reviewed the options and rationale as well as the risks of this procedure that have been previously discussed with the patient. All questions asked by the patient and/or family were answered to their satisfaction.  Patient agrees to proceed with this procedure at this time.  Marolyn Urschel E Ameisha Mcclellan M.D. FACS  

## 2017-03-14 NOTE — Anesthesia Postprocedure Evaluation (Signed)
Anesthesia Post Note  Patient: Rhonda [name]  Procedure(s) Performed: LAPAROSCOPIC CHOLECYSTECTOMY (N/A )  Patient location during evaluation: PACU Anesthesia Type: General Level of consciousness: awake and alert Pain management: pain level controlled Vital Signs Assessment: post-procedure vital signs reviewed and stable Respiratory status: spontaneous breathing and respiratory function stable Cardiovascular status: stable Anesthetic complications: no     Last Vitals:  Vitals:   03/14/17 1127 03/14/17 1131  BP: (!) 143/74   Pulse: 77 72  Resp: 11 14  Temp: (!) 36.3 C   SpO2: 100% 100%    Last Pain:  Vitals:   03/14/17 0939  TempSrc: Tympanic  PainSc: 0-No pain                 KEPHART,WILLIAM K

## 2017-03-14 NOTE — Op Note (Signed)
Laparoscopic Cholecystectomy  Pre-operative Diagnosis: Biliary colic  Post-operative Diagnosis: Biliary colic  Procedure: Laparoscopic cholecystectomy  Surgeon: Jerrol Banana. Burt Knack, MD FACS  Anesthesia: Gen. with endotracheal tube  Assistant: RN  Procedure Details  The patient was seen again in the Holding Room. The benefits, complications, treatment options, and expected outcomes were discussed with the patient. The risks of bleeding, infection, recurrence of symptoms, failure to resolve symptoms, bile duct damage, bile duct leak, retained common bile duct stone, bowel injury, any of which could require further surgery and/or ERCP, stent, or papillotomy were reviewed with the patient. The likelihood of improving the patient's symptoms with return to their baseline status is good.  The patient and/or family concurred with the proposed plan, giving informed consent.  The patient was taken to Operating Room, identified as CAMRIN LAPRE and the procedure verified as Laparoscopic Cholecystectomy.  A Time Out was held and the above information confirmed.  Prior to the induction of general anesthesia, antibiotic prophylaxis was administered. VTE prophylaxis was in place. General endotracheal anesthesia was then administered and tolerated well. After the induction, the abdomen was prepped with Chloraprep and draped in the sterile fashion. The patient was positioned in the supine position.  Local anesthetic  was injected into the skin near the umbilicus and an incision made. The Veress needle was placed. Pneumoperitoneum was then created with CO2 and tolerated well without any adverse changes in the patient's vital signs. A 75mm port was placed in the periumbilical position and the abdominal cavity was explored.  Two 5-mm ports were placed in the right upper quadrant and a 12 mm epigastric port was placed all under direct vision. All skin incisions  were infiltrated with a local anesthetic agent before  making the incision and placing the trocars.   The patient was positioned  in reverse Trendelenburg, tilted slightly to the patient's left.  The gallbladder was identified, the fundus grasped and retracted cephalad. Adhesions were lysed bluntly. The infundibulum was grasped and retracted laterally, exposing the peritoneum overlying the triangle of Calot. This was then divided and exposed in a blunt fashion. A critical view of the cystic duct and cystic artery was obtained.  The cystic duct was clearly identified and bluntly dissected.   The cystic artery was well identified doubly clipped and divided. This allowed for good visualization of the cystic duct as it entered the infundibulum of the gallbladder. Here it was doubly clipped and divided.  The gallbladder was taken from the gallbladder fossa in a retrograde fashion with the electrocautery. The gallbladder was removed and placed in an Endocatch bag. The liver bed was irrigated and inspected. Hemostasis was achieved with the electrocautery. Copious irrigation was utilized and was repeatedly aspirated until clear.  The gallbladder and Endocatch sac were then removed through the epigastric port site.   Inspection of the right upper quadrant was performed. No bleeding, bile duct injury or leak, or bowel injury was noted. Pneumoperitoneum was released.  The epigastric port site was closed with figure-of-eight 0 Vicryl sutures. 4-0 subcuticular Monocryl was used to close the skin. Steristrips and Mastisol and sterile dressings were  applied.  The patient was then extubated and brought to the recovery room in stable condition. Sponge, lap, and needle counts were correct at closure and at the conclusion of the case.   Findings: Chronic Cholecystitis   Estimated Blood Loss: Minimal         Drains: None         Specimens: Gallbladder  Complications: none               Itzae Miralles E. Burt Knack, MD, FACS

## 2017-03-14 NOTE — Transfer of Care (Signed)
Immediate Anesthesia Transfer of Care Note  Patient: Rhonda [name]  Procedure(s) Performed: LAPAROSCOPIC CHOLECYSTECTOMY (N/A )  Patient Location: PACU  Anesthesia Type:General  Level of Consciousness: alert   Airway & Oxygen Therapy: Patient Spontanous Breathing  Post-op Assessment: Post -op Vital signs reviewed and stable  Post vital signs: Reviewed  Last Vitals:  Vitals:   03/14/17 0939 03/14/17 1127  BP: (!) 153/65 (!) 143/74  Pulse: 86 77  Resp: 18 11  Temp: 37.1 C (!) 36.3 C  SpO2: 98% 100%    Last Pain:  Vitals:   03/14/17 0939  TempSrc: Tympanic  PainSc: 0-No pain         Complications: No apparent anesthesia complications

## 2017-03-14 NOTE — Anesthesia Procedure Notes (Signed)
Procedure Name: Intubation Date/Time: 03/14/2017 11:06 AM Performed by: Timoteo Expose Pre-anesthesia Checklist: Patient identified, Emergency Drugs available, Suction available, Patient being monitored and Timeout performed Patient Re-evaluated:Patient Re-evaluated prior to induction Oxygen Delivery Method: Circle system utilized Preoxygenation: Pre-oxygenation with 100% oxygen Induction Type: IV induction Laryngoscope Size: Mac and 3 Grade View: Grade III Tube type: Oral Tube size: 6.5 mm Number of attempts: 1 Placement Confirmation: ETT inserted through vocal cords under direct vision,  positive ETCO2,  CO2 detector and breath sounds checked- equal and bilateral Secured at: 20 cm Dental Injury: Teeth and Oropharynx as per pre-operative assessment

## 2017-03-14 NOTE — Discharge Instructions (Signed)
AMBULATORY SURGERY  °DISCHARGE INSTRUCTIONS ° ° °1) The drugs that you were given will stay in your system until tomorrow so for the next 24 hours you should not: ° °A) Drive an automobile °B) Make any legal decisions °C) Drink any alcoholic beverage ° ° °2) You may resume regular meals tomorrow.  Today it is better to start with liquids and gradually work up to solid foods. ° °You may eat anything you prefer, but it is better to start with liquids, then soup and crackers, and gradually work up to solid foods. ° ° °3) Please notify your doctor immediately if you have any unusual bleeding, trouble breathing, redness and pain at the surgery site, drainage, fever, or pain not relieved by medication. °4)  ° °5) Your post-operative visit with Dr.                     °           °     is: Date:                        Time:   ° °Please call to schedule your post-operative visit. ° °6) Additional Instructions: ° ° ° ° °Remove dressing in 24 hours. °May shower in 24 hours. °Leave paper strips in place. °Resume all home medications. °Follow-up with Dr. Cooper in 10 days. °

## 2017-03-15 LAB — SURGICAL PATHOLOGY

## 2017-03-18 ENCOUNTER — Telehealth: Payer: Self-pay | Admitting: *Deleted

## 2017-03-18 NOTE — Telephone Encounter (Signed)
Yes, keep appointments as scheduled.

## 2017-03-18 NOTE — Telephone Encounter (Signed)
Patient informed and agrees to come in as scheduled

## 2017-03-18 NOTE — Telephone Encounter (Signed)
Patient called to report that she had a cholecystectomy this past Thursday and that she has a lab/inf appointment tomorrow. She is asking if she is ok to come for infusion (Zometa) Please advise

## 2017-03-19 ENCOUNTER — Inpatient Hospital Stay: Payer: Medicare Other | Attending: Oncology

## 2017-03-19 ENCOUNTER — Inpatient Hospital Stay: Payer: Medicare Other

## 2017-03-19 VITALS — BP 113/67 | HR 73 | Temp 98.2°F | Resp 18

## 2017-03-19 DIAGNOSIS — E785 Hyperlipidemia, unspecified: Secondary | ICD-10-CM | POA: Insufficient documentation

## 2017-03-19 DIAGNOSIS — K219 Gastro-esophageal reflux disease without esophagitis: Secondary | ICD-10-CM | POA: Insufficient documentation

## 2017-03-19 DIAGNOSIS — Z85828 Personal history of other malignant neoplasm of skin: Secondary | ICD-10-CM | POA: Diagnosis not present

## 2017-03-19 DIAGNOSIS — M1990 Unspecified osteoarthritis, unspecified site: Secondary | ICD-10-CM | POA: Insufficient documentation

## 2017-03-19 DIAGNOSIS — Z79899 Other long term (current) drug therapy: Secondary | ICD-10-CM | POA: Insufficient documentation

## 2017-03-19 DIAGNOSIS — Z7982 Long term (current) use of aspirin: Secondary | ICD-10-CM | POA: Diagnosis not present

## 2017-03-19 DIAGNOSIS — E039 Hypothyroidism, unspecified: Secondary | ICD-10-CM | POA: Diagnosis not present

## 2017-03-19 DIAGNOSIS — C9001 Multiple myeloma in remission: Secondary | ICD-10-CM | POA: Diagnosis present

## 2017-03-19 DIAGNOSIS — C9000 Multiple myeloma not having achieved remission: Secondary | ICD-10-CM

## 2017-03-19 LAB — CBC WITH DIFFERENTIAL/PLATELET
Basophils Absolute: 0 10*3/uL (ref 0–0.1)
Basophils Relative: 1 %
Eosinophils Absolute: 0.1 10*3/uL (ref 0–0.7)
Eosinophils Relative: 1 %
HEMATOCRIT: 37 % (ref 35.0–47.0)
HEMOGLOBIN: 12.8 g/dL (ref 12.0–16.0)
LYMPHS ABS: 1.5 10*3/uL (ref 1.0–3.6)
LYMPHS PCT: 20 %
MCH: 33.2 pg (ref 26.0–34.0)
MCHC: 34.6 g/dL (ref 32.0–36.0)
MCV: 96 fL (ref 80.0–100.0)
Monocytes Absolute: 0.7 10*3/uL (ref 0.2–0.9)
Monocytes Relative: 9 %
NEUTROS PCT: 69 %
Neutro Abs: 5.3 10*3/uL (ref 1.4–6.5)
Platelets: 212 10*3/uL (ref 150–440)
RBC: 3.85 MIL/uL (ref 3.80–5.20)
RDW: 12.6 % (ref 11.5–14.5)
WBC: 7.6 10*3/uL (ref 3.6–11.0)

## 2017-03-19 LAB — BASIC METABOLIC PANEL
Anion gap: 8 (ref 5–15)
BUN: 21 mg/dL — AB (ref 6–20)
CHLORIDE: 107 mmol/L (ref 101–111)
CO2: 24 mmol/L (ref 22–32)
Calcium: 10.9 mg/dL — ABNORMAL HIGH (ref 8.9–10.3)
Creatinine, Ser: 0.94 mg/dL (ref 0.44–1.00)
GFR calc non Af Amer: 55 mL/min — ABNORMAL LOW (ref >60)
Glucose, Bld: 131 mg/dL — ABNORMAL HIGH (ref 65–99)
POTASSIUM: 3.7 mmol/L (ref 3.5–5.1)
SODIUM: 139 mmol/L (ref 135–145)

## 2017-03-19 MED ORDER — ZOLEDRONIC ACID 4 MG/5ML IV CONC
3.3000 mg | Freq: Once | INTRAVENOUS | Status: AC
  Administered 2017-03-19: 3.3 mg via INTRAVENOUS
  Filled 2017-03-19: qty 4.13

## 2017-03-19 MED ORDER — SODIUM CHLORIDE 0.9 % IV SOLN
Freq: Once | INTRAVENOUS | Status: AC
  Administered 2017-03-19: 14:00:00 via INTRAVENOUS
  Filled 2017-03-19: qty 1000

## 2017-03-20 LAB — PROTEIN ELECTROPHORESIS, SERUM
A/G Ratio: 1.5 (ref 0.7–1.7)
ALPHA-1-GLOBULIN: 0.2 g/dL (ref 0.0–0.4)
ALPHA-2-GLOBULIN: 0.8 g/dL (ref 0.4–1.0)
Albumin ELP: 3.5 g/dL (ref 2.9–4.4)
BETA GLOBULIN: 0.9 g/dL (ref 0.7–1.3)
GAMMA GLOBULIN: 0.6 g/dL (ref 0.4–1.8)
GLOBULIN, TOTAL: 2.4 g/dL (ref 2.2–3.9)
M-SPIKE, %: 0.1 g/dL — AB
Total Protein ELP: 5.9 g/dL — ABNORMAL LOW (ref 6.0–8.5)

## 2017-03-20 LAB — KAPPA/LAMBDA LIGHT CHAINS
Kappa free light chain: 8.2 mg/L (ref 3.3–19.4)
Kappa, lambda light chain ratio: 0.01 — ABNORMAL LOW (ref 0.26–1.65)
Lambda free light chains: 1524 mg/L — ABNORMAL HIGH (ref 5.7–26.3)

## 2017-03-20 LAB — IGG, IGA, IGM
IGA: 30 mg/dL — AB (ref 64–422)
IGG (IMMUNOGLOBIN G), SERUM: 457 mg/dL — AB (ref 700–1600)
IgM (Immunoglobulin M), Srm: 14 mg/dL — ABNORMAL LOW (ref 26–217)

## 2017-03-21 ENCOUNTER — Ambulatory Visit: Payer: Medicare Other | Admitting: Gastroenterology

## 2017-03-28 ENCOUNTER — Ambulatory Visit (INDEPENDENT_AMBULATORY_CARE_PROVIDER_SITE_OTHER): Payer: Medicare Other | Admitting: General Surgery

## 2017-03-28 ENCOUNTER — Encounter: Payer: Self-pay | Admitting: General Surgery

## 2017-03-28 VITALS — BP 114/70 | HR 47 | Temp 97.4°F | Wt 129.0 lb

## 2017-03-28 DIAGNOSIS — Z4889 Encounter for other specified surgical aftercare: Secondary | ICD-10-CM

## 2017-03-28 NOTE — Patient Instructions (Signed)

## 2017-03-28 NOTE — Progress Notes (Signed)
Outpatient Surgical Follow Up  03/28/2017  Rhonda [name] is an 83 y.o. female.   Chief Complaint  Patient presents with  . Routine Post Op    Laparoscopic Cholecystectomy-Dr Cooper-03/14/2017    HPI: 83 year old female returns to clinic for follow-up 2 weeks status post laparoscopic cholecystectomy. Patient reports that she's not had any pain. She only took 2 pain pills total in the postoperative time. She denies anyfevers, chills, nausea, vomiting, chest pain, shortness of breath, diarrhea, constipation. She reports the pain she was having before surgery has completely resolved.  Past Medical History:  Diagnosis Date  . Arthritis    hands  . Benign neoplasm of ascending colon   . Cataract   . GERD (gastroesophageal reflux disease)   . Hyperlipemia   . Hypertension 01/31/2015  . Hypothyroidism   . Multiple myeloma ([place]) 04/18/2016  . Multiple myeloma in remission ([place]) 04/18/2016  . Osteoporosis   . Skin cancer    basal cell    Past Surgical History:  Procedure Laterality Date  . BREAST EXCISIONAL BIOPSY Right 15+ yrs ago   EXCISIONAL - NEG  . CATARACT EXTRACTION W/ INTRAOCULAR LENS IMPLANT    . CHOLECYSTECTOMY N/A 03/14/2017   Procedure: LAPAROSCOPIC CHOLECYSTECTOMY;  Surgeon: Florene Glen, MD;  Location: [facility];  Service: General;  Laterality: N/A;  . COLONOSCOPY WITH PROPOFOL N/A 02/21/2015   Procedure: COLONOSCOPY WITH PROPOFOL;  Surgeon: Lucilla Lame, MD;  Location: [place];  Service: Endoscopy;  Laterality: N/A;  . [place]  . POLYPECTOMY  02/21/2015   Procedure: POLYPECTOMY;  Surgeon: Lucilla Lame, MD;  Location: [place];  Service: Endoscopy;;    Family History  Problem Relation Age of Onset  . Heart disease Mother   . Stroke Father   . Heart disease Brother   . Kidney cancer Neg Hx   . Prostate cancer Neg Hx   . Bladder Cancer Neg Hx   . Breast cancer Neg Hx     Social History:  reports that she has never  smoked. She has never used smokeless tobacco. She reports that she does not drink alcohol or use drugs.  Allergies: No Known Allergies  Medications reviewed.    ROS A multipoint review of systems was completed. All pertinent positives and negatives are documented in the history of present illness and remainder are negative.   BP 114/70   Pulse (!) 47   Temp (!) 97.4 F (36.3 C) (Oral)   Wt 58.5 kg (129 lb)   BMI 23.59 kg/m   Physical Exam Gen.: No acute distress Chest: Clear to auscultation   heart: Regular rate and rhythm Abdomen: Soft, nontender, nondistended. Well approximated laparoscopic incisions without any evidence of erythema or drainage.    No results found for this or any previous visit (from the past 48 hour(s)). No results found.  Assessment/Plan:  1. Aftercare following surgery 83 year old female status post laparoscopic cholecystectomy. Doing very well. Pathology reviewed with the patient. Discussed anticipated continued recovery and returning to normal activities. She voiced understanding and will follow-up in clinic on an as-needed basis.     Clayburn Pert, MD FACS General Surgeon  03/28/2017,2:10 PM

## 2017-04-10 NOTE — Progress Notes (Signed)
[place]  [phone] [phone]) [phone]  ID: [name] OB: 29-Sep-1933  MR#: [id number]  SVX#:[id number]  Patient Care Team: Lavera Guise, MD as PCP - General (Internal Medicine)  CHIEF COMPLAINT:  Multiple myeloma in remission.  INTERVAL HISTORY: Patient returns to clinic today for further evaluation and continuation of Zometa. The neuropathy in her feet has resolved. She currently feels well and is asymptomatic. She has no neurologic complaints. She denies any recent fevers or illnesses. She has a good appetite and denies weight loss. She denies any chest pain or shortness of breath. She denies any nausea, vomiting, constipation, or diarrhea.  She has no hematuria, melena, or hematochezia. She has no urinary complaints.  Patient offers no specific complaints today.   REVIEW OF SYSTEMS:   Review of Systems  Constitutional: Negative.  Negative for fever, malaise/fatigue and weight loss.  HENT: Negative for congestion and sore throat.   Respiratory: Negative for cough and shortness of breath.   Cardiovascular: Negative for chest pain, palpitations and leg swelling.  Gastrointestinal: Negative for abdominal pain, blood in stool, constipation, diarrhea, melena, nausea and vomiting.  Genitourinary: Negative.   Musculoskeletal: Negative.   Neurological: Negative for dizziness, tingling, sensory change, weakness and headaches.  Psychiatric/Behavioral: Negative.  The patient is not nervous/anxious and does not have insomnia.     As per HPI. Otherwise, a complete review of systems is negative.  PAST MEDICAL HISTORY: Past Medical History:  Diagnosis Date  . Arthritis    hands  . Benign neoplasm of ascending colon   . Cataract   . GERD (gastroesophageal reflux disease)   . Hyperlipemia   . Hypertension 01/31/2015  . Hypothyroidism   . Multiple myeloma ([place]) 04/18/2016  . Multiple myeloma in remission ([place]) 04/18/2016  . Osteoporosis   . Skin  cancer    basal cell    PAST SURGICAL HISTORY: Past Surgical History:  Procedure Laterality Date  . BREAST EXCISIONAL BIOPSY Right 15+ yrs ago   EXCISIONAL - NEG  . CATARACT EXTRACTION W/ INTRAOCULAR LENS IMPLANT    . LAPAROSCOPIC HYSTERECTOMY  1980    FAMILY HISTORY: Family History  Problem Relation Age of Onset  . Heart disease Mother   . Stroke Father   . Heart disease Brother   . Kidney cancer Neg Hx   . Prostate cancer Neg Hx   . Bladder Cancer Neg Hx   . Breast cancer Neg Hx     ADVANCED DIRECTIVES (Y/N):  N  HEALTH MAINTENANCE: Social History   Tobacco Use  . Smoking status: Never Smoker  . Smokeless tobacco: Never Used  Substance Use Topics  . Alcohol use: No    Alcohol/week: 0.0 oz  . Drug use: No     Colonoscopy:  PAP:  Bone density:  Lipid panel:  No Known Allergies  Current Outpatient Medications  Medication Sig Dispense Refill  . aspirin EC 81 MG tablet Take 81 mg by mouth daily.    . Cyanocobalamin (B-12 PO) Take 500 mcg by mouth daily.     . fexofenadine (ALLEGRA) 30 MG tablet Take 30 mg by mouth daily.     [place] Kitchen levothyroxine (SYNTHROID, LEVOTHROID) 50 MCG tablet Take 50 mcg by mouth daily before breakfast.     . Multiple Vitamins-Minerals (CENTRUM SILVER PO) Take 1 tablet by mouth daily.    . RESTASIS 0.05 % ophthalmic emulsion Place 1 drop into both eyes daily.     Marland Kitchen triamcinolone (NASACORT ALLERGY 24HR) 55 MCG/ACT AERO  nasal inhaler Place 2 sprays into the nose 2 (two) times daily as needed.     Marland Kitchen UNABLE TO FIND Inject 1 Dose as directed once a week. Allergy injections once a week     . Potassium Chloride ER 20 MEQ TBCR Take 1 tablet by mouth 2 (two) times daily. 10 tablet 0   No current facility-administered medications for this visit.     OBJECTIVE: Vitals:   04/16/17 1446  BP: 133/75  Pulse: 79  Resp: 18  Temp: 98.7 F (37.1 C)     Body mass index is 23.87 kg/m.    ECOG FS:0 - Asymptomatic  General: Well-developed,  well-nourished, no acute distress. Eyes: Pink conjunctiva, anicteric sclera. Lungs: Clear to auscultation bilaterally. Heart: Regular rate and rhythm. No rubs, murmurs, or gallops. Abdomen: Soft, nontender, nondistended. No organomegaly noted, normoactive bowel sounds. Musculoskeletal: No edema, cyanosis, or clubbing. Neuro: Alert, answering all questions appropriately. Cranial nerves grossly intact. Skin: No rashes or petechiae noted. Psych: Normal affect.   LAB RESULTS:  Lab Results  Component Value Date   NA 135 04/16/2017   K 4.0 04/16/2017   CL 105 04/16/2017   CO2 23 04/16/2017   GLUCOSE 104 (H) 04/16/2017   BUN 20 04/16/2017   CREATININE 0.97 04/16/2017   CALCIUM 10.5 (H) 04/16/2017   PROT 6.5 03/04/2017   ALBUMIN 4.2 03/04/2017   AST 24 03/04/2017   ALT 16 03/04/2017   ALKPHOS 49 03/04/2017   BILITOT 0.5 03/04/2017   GFRNONAA 53 (L) 04/16/2017   GFRAA >60 04/16/2017    Lab Results  Component Value Date   WBC 5.9 04/16/2017   NEUTROABS 3.6 04/16/2017   HGB 12.5 04/16/2017   HCT 36.2 04/16/2017   MCV 95.4 04/16/2017   PLT 189 04/16/2017   Lab Results  Component Value Date   TOTALPROTELP 5.9 (L) 03/19/2017   ALBUMINELP 3.5 03/19/2017   A1GS 0.2 03/19/2017   A2GS 0.8 03/19/2017   BETS 0.9 03/19/2017   GAMS 0.6 03/19/2017   MSPIKE 0.1 (H) 03/19/2017   SPEI Comment 03/19/2017     STUDIES: No results found.  ASSESSMENT:  Multiple myeloma in remission.  PLAN:    1.  Multiple myeloma in remission: Patient's calcium levels are mildly elevated today, therefore will proceed with Zometa. Patient's M spike fluctuates between 0.0 and 0.2. Her lambda free light chains seem to be erratic between 848 and 1427, today's result is pending. Previously, bone marrow biopsy had 44% plasma cells with normal cytogenetics. Given the results of her metastatic bone survey, her elevated lambda free chains, and her bone marrow biopsy, patient fit the criteria for multiple  myeloma. Because of patient's peripheral neuropathy, Revlimid was discontinued.  We may need to restart treatment in the near future though. Maintenance treatment was not started given her neuropathy. Patient initiated monthly Zometa on May 07, 2016. Continue 3.3 mg Zometa every 4 weeks. Return to clinic in 4 weeks for laboratory work and Zometa and then in 8 weeks for further evaluation and continuation of Zometa.  2. Hypercalcemia: Zometa as above. Monitor.  3. Thrombocytopenia: Resolved, monitor.  Patient expressed understanding and was in agreement with this plan. She also understands that She can call clinic at any time with any questions, concerns, or complaints.    Lloyd Huger, MD 04/17/17 1:43 PM

## 2017-04-15 ENCOUNTER — Other Ambulatory Visit: Payer: Self-pay | Admitting: *Deleted

## 2017-04-15 DIAGNOSIS — C9001 Multiple myeloma in remission: Secondary | ICD-10-CM

## 2017-04-16 ENCOUNTER — Inpatient Hospital Stay (HOSPITAL_BASED_OUTPATIENT_CLINIC_OR_DEPARTMENT_OTHER): Payer: Medicare Other | Admitting: Oncology

## 2017-04-16 ENCOUNTER — Inpatient Hospital Stay: Payer: Medicare Other

## 2017-04-16 ENCOUNTER — Inpatient Hospital Stay: Payer: Medicare Other | Attending: Oncology

## 2017-04-16 VITALS — BP 133/75 | HR 79 | Temp 98.7°F | Resp 18 | Wt 130.5 lb

## 2017-04-16 DIAGNOSIS — E785 Hyperlipidemia, unspecified: Secondary | ICD-10-CM | POA: Diagnosis not present

## 2017-04-16 DIAGNOSIS — Z85828 Personal history of other malignant neoplasm of skin: Secondary | ICD-10-CM | POA: Diagnosis not present

## 2017-04-16 DIAGNOSIS — Z7982 Long term (current) use of aspirin: Secondary | ICD-10-CM | POA: Insufficient documentation

## 2017-04-16 DIAGNOSIS — C9001 Multiple myeloma in remission: Secondary | ICD-10-CM

## 2017-04-16 DIAGNOSIS — I10 Essential (primary) hypertension: Secondary | ICD-10-CM | POA: Insufficient documentation

## 2017-04-16 DIAGNOSIS — E039 Hypothyroidism, unspecified: Secondary | ICD-10-CM

## 2017-04-16 DIAGNOSIS — Z79899 Other long term (current) drug therapy: Secondary | ICD-10-CM

## 2017-04-16 DIAGNOSIS — K219 Gastro-esophageal reflux disease without esophagitis: Secondary | ICD-10-CM

## 2017-04-16 LAB — BASIC METABOLIC PANEL
Anion gap: 7 (ref 5–15)
BUN: 20 mg/dL (ref 6–20)
CALCIUM: 10.5 mg/dL — AB (ref 8.9–10.3)
CHLORIDE: 105 mmol/L (ref 101–111)
CO2: 23 mmol/L (ref 22–32)
CREATININE: 0.97 mg/dL (ref 0.44–1.00)
GFR calc Af Amer: >60 mL/min (ref >60)
GFR calc non Af Amer: 53 mL/min — ABNORMAL LOW (ref >60)
Glucose, Bld: 104 mg/dL — ABNORMAL HIGH (ref 65–99)
Potassium: 4 mmol/L (ref 3.5–5.1)
SODIUM: 135 mmol/L (ref 135–145)

## 2017-04-16 LAB — CBC WITH DIFFERENTIAL/PLATELET
Basophils Absolute: 0 10*3/uL (ref 0–0.1)
Basophils Relative: 1 %
EOS ABS: 0 10*3/uL (ref 0–0.7)
EOS PCT: 0 %
HEMATOCRIT: 36.2 % (ref 35.0–47.0)
HEMOGLOBIN: 12.5 g/dL (ref 12.0–16.0)
LYMPHS ABS: 1.6 10*3/uL (ref 1.0–3.6)
Lymphocytes Relative: 26 %
MCH: 32.8 pg (ref 26.0–34.0)
MCHC: 34.4 g/dL (ref 32.0–36.0)
MCV: 95.4 fL (ref 80.0–100.0)
MONOS PCT: 12 %
Monocytes Absolute: 0.7 10*3/uL (ref 0.2–0.9)
Neutro Abs: 3.6 10*3/uL (ref 1.4–6.5)
Neutrophils Relative %: 61 %
Platelets: 189 10*3/uL (ref 150–440)
RBC: 3.8 MIL/uL (ref 3.80–5.20)
RDW: 13 % (ref 11.5–14.5)
WBC: 5.9 10*3/uL (ref 3.6–11.0)

## 2017-04-16 MED ORDER — SODIUM CHLORIDE 0.9 % IV SOLN
3.3000 mg | Freq: Once | INTRAVENOUS | Status: AC
  Administered 2017-04-16: 3.3 mg via INTRAVENOUS
  Filled 2017-04-16: qty 4.13

## 2017-04-16 MED ORDER — SODIUM CHLORIDE 0.9 % IV SOLN
INTRAVENOUS | Status: DC
  Administered 2017-04-16: 15:00:00 via INTRAVENOUS
  Filled 2017-04-16: qty 1000

## 2017-04-17 LAB — PROTEIN ELECTROPHORESIS, SERUM
A/G RATIO SPE: 1.7 (ref 0.7–1.7)
ALBUMIN ELP: 3.8 g/dL (ref 2.9–4.4)
ALPHA-1-GLOBULIN: 0.2 g/dL (ref 0.0–0.4)
Alpha-2-Globulin: 0.7 g/dL (ref 0.4–1.0)
BETA GLOBULIN: 0.9 g/dL (ref 0.7–1.3)
GAMMA GLOBULIN: 0.6 g/dL (ref 0.4–1.8)
GLOBULIN, TOTAL: 2.3 g/dL (ref 2.2–3.9)
M-Spike, %: 0.2 g/dL — ABNORMAL HIGH
TOTAL PROTEIN ELP: 6.1 g/dL (ref 6.0–8.5)

## 2017-04-17 LAB — IGG, IGA, IGM
IgA: 29 mg/dL — ABNORMAL LOW (ref 64–422)
IgG (Immunoglobin G), Serum: 531 mg/dL — ABNORMAL LOW (ref 700–1600)
IgM (Immunoglobulin M), Srm: 15 mg/dL — ABNORMAL LOW (ref 26–217)

## 2017-04-17 LAB — KAPPA/LAMBDA LIGHT CHAINS
Kappa free light chain: 9.9 mg/L (ref 3.3–19.4)
Kappa, lambda light chain ratio: 0.01 — ABNORMAL LOW (ref 0.26–1.65)
Lambda free light chains: 1610.6 mg/L — ABNORMAL HIGH (ref 5.7–26.3)

## 2017-05-17 ENCOUNTER — Inpatient Hospital Stay: Payer: Medicare Other

## 2017-05-17 ENCOUNTER — Inpatient Hospital Stay: Payer: Medicare Other | Attending: Oncology

## 2017-05-17 DIAGNOSIS — E039 Hypothyroidism, unspecified: Secondary | ICD-10-CM | POA: Diagnosis not present

## 2017-05-17 DIAGNOSIS — E785 Hyperlipidemia, unspecified: Secondary | ICD-10-CM | POA: Insufficient documentation

## 2017-05-17 DIAGNOSIS — Z7982 Long term (current) use of aspirin: Secondary | ICD-10-CM | POA: Insufficient documentation

## 2017-05-17 DIAGNOSIS — I10 Essential (primary) hypertension: Secondary | ICD-10-CM | POA: Insufficient documentation

## 2017-05-17 DIAGNOSIS — Z85828 Personal history of other malignant neoplasm of skin: Secondary | ICD-10-CM | POA: Diagnosis not present

## 2017-05-17 DIAGNOSIS — Z79899 Other long term (current) drug therapy: Secondary | ICD-10-CM | POA: Insufficient documentation

## 2017-05-17 DIAGNOSIS — K219 Gastro-esophageal reflux disease without esophagitis: Secondary | ICD-10-CM | POA: Insufficient documentation

## 2017-05-17 DIAGNOSIS — C9001 Multiple myeloma in remission: Secondary | ICD-10-CM | POA: Insufficient documentation

## 2017-05-17 DIAGNOSIS — C9000 Multiple myeloma not having achieved remission: Secondary | ICD-10-CM

## 2017-05-17 LAB — BASIC METABOLIC PANEL
Anion gap: 6 (ref 5–15)
BUN: 26 mg/dL — AB (ref 6–20)
CALCIUM: 10.4 mg/dL — AB (ref 8.9–10.3)
CO2: 25 mmol/L (ref 22–32)
CREATININE: 0.71 mg/dL (ref 0.44–1.00)
Chloride: 107 mmol/L (ref 101–111)
GFR calc Af Amer: >60 mL/min (ref >60)
GLUCOSE: 97 mg/dL (ref 65–99)
Potassium: 3.7 mmol/L (ref 3.5–5.1)
Sodium: 138 mmol/L (ref 135–145)

## 2017-05-17 MED ORDER — ZOLEDRONIC ACID 4 MG/100ML IV SOLN
3.3000 mg | Freq: Once | INTRAVENOUS | Status: AC
  Administered 2017-05-17: 3.3 mg via INTRAVENOUS
  Filled 2017-05-17: qty 100

## 2017-05-17 MED ORDER — SODIUM CHLORIDE 0.9 % IV SOLN
INTRAVENOUS | Status: DC
  Administered 2017-05-17: 15:00:00 via INTRAVENOUS
  Filled 2017-05-17: qty 1000

## 2017-06-14 ENCOUNTER — Other Ambulatory Visit: Payer: Self-pay

## 2017-06-14 DIAGNOSIS — C9001 Multiple myeloma in remission: Secondary | ICD-10-CM

## 2017-06-16 NOTE — Progress Notes (Signed)
[place]  [phone] Fax:([phone]  ID: Rhonda [name] OB: 12-20-1933  MR#: [id number]  BMW#:[id number]  Patient Care Team: Lavera Guise, MD as PCP - General (Internal Medicine)  CHIEF COMPLAINT:  Multiple myeloma in remission.  INTERVAL HISTORY: Patient returns to clinic today for further evaluation and continuation of Zometa. The neuropathy in her feet has resolved. She currently feels well and is asymptomatic. She has no neurologic complaints. She denies any recent fevers or illnesses. She has a good appetite and denies weight loss. She denies any chest [name] or shortness of breath. She denies any nausea, vomiting, constipation, or diarrhea.  She has no hematuria, melena, or hematochezia. She has no urinary complaints.  Patient offers no specific complaints today.   REVIEW OF SYSTEMS:   Review of Systems  Constitutional: Negative.  Negative for fever, malaise/fatigue and weight loss.  HENT: Negative for congestion and sore throat.   Respiratory: Negative for cough and shortness of breath.   Cardiovascular: Negative for chest [name], palpitations and leg swelling.  Gastrointestinal: Negative for abdominal [name], blood in stool, constipation, diarrhea, melena, nausea and vomiting.  Genitourinary: Negative.   Musculoskeletal: Negative.   Neurological: Negative for dizziness, tingling, sensory change, weakness and headaches.  Psychiatric/Behavioral: Negative.  The patient is not nervous/anxious and does not have insomnia.     As per HPI. Otherwise, a complete review of systems is negative.  PAST MEDICAL HISTORY: Past Medical History:  Diagnosis Date  . Arthritis    hands  . Benign neoplasm of ascending colon   . Cataract   . GERD (gastroesophageal reflux disease)   . Hyperlipemia   . Hypertension 01/31/2015  . Hypothyroidism   . Multiple myeloma ([place]) 04/18/2016  . Multiple myeloma in remission ([place]) 04/18/2016  . Osteoporosis   . Skin  cancer    basal cell    PAST SURGICAL HISTORY: Past Surgical History:  Procedure Laterality Date  . BREAST EXCISIONAL BIOPSY Right 15+ yrs ago   EXCISIONAL - NEG  . CATARACT EXTRACTION W/ INTRAOCULAR LENS IMPLANT    . CHOLECYSTECTOMY N/A 03/14/2017   Procedure: LAPAROSCOPIC CHOLECYSTECTOMY;  Surgeon: Florene Glen, MD;  Location: [facility];  Service: General;  Laterality: N/A;  . COLONOSCOPY WITH PROPOFOL N/A 02/21/2015   Procedure: COLONOSCOPY WITH PROPOFOL;  Surgeon: Lucilla Lame, MD;  Location: [place];  Service: Endoscopy;  Laterality: N/A;  . [place]  . POLYPECTOMY  02/21/2015   Procedure: POLYPECTOMY;  Surgeon: Lucilla Lame, MD;  Location: [place];  Service: Endoscopy;;    FAMILY HISTORY: Family History  Problem Relation Age of Onset  . Heart disease Mother   . Stroke Father   . Heart disease Brother   . Kidney cancer Neg Hx   . Prostate cancer Neg Hx   . Bladder Cancer Neg Hx   . Breast cancer Neg Hx     ADVANCED DIRECTIVES (Y/N):  N  HEALTH MAINTENANCE: Social History   Tobacco Use  . Smoking status: Never Smoker  . Smokeless tobacco: Never Used  Substance Use Topics  . Alcohol use: No    Alcohol/week: 0.0 oz  . Drug use: No     Colonoscopy:  PAP:  Bone density:  Lipid panel:  No Known Allergies  Current Outpatient Medications  Medication Sig Dispense Refill  . aspirin EC 81 MG tablet Take 81 mg by mouth daily.    . Cyanocobalamin (B-12 PO) Take 500 mcg by mouth daily.     Marland Kitchen  fexofenadine (ALLEGRA) 30 MG tablet Take 30 mg by mouth daily.     Marland Kitchen levothyroxine (SYNTHROID, LEVOTHROID) 50 MCG tablet Take 50 mcg by mouth daily before breakfast.     . Multiple Vitamins-Minerals (CENTRUM SILVER PO) Take 1 tablet by mouth daily.    . RESTASIS 0.05 % ophthalmic emulsion Place 1 drop into both eyes daily.     Marland Kitchen triamcinolone (NASACORT ALLERGY 24HR) 55 MCG/ACT AERO nasal inhaler Place 2 sprays into the nose 2 (two)  times daily as needed.     Marland Kitchen UNABLE TO FIND Inject 1 Dose as directed once a week. Allergy injections once a week     . Potassium Chloride ER 20 MEQ TBCR Take 1 tablet by mouth 2 (two) times daily. 10 tablet 0   No current facility-administered medications for this visit.     OBJECTIVE: Vitals:   06/17/17 1406  BP: 114/69  Pulse: 77  Resp: 18  Temp: 98.7 F (37.1 C)     Body mass index is 23.37 kg/m.    ECOG FS:0 - Asymptomatic  General: Well-developed, well-nourished, no acute distress. Eyes: Pink conjunctiva, anicteric sclera. Lungs: Clear to auscultation bilaterally. Heart: Regular rate and rhythm. No rubs, murmurs, or gallops. Abdomen: Soft, nontender, nondistended. No organomegaly noted, normoactive bowel sounds. Musculoskeletal: No edema, cyanosis, or clubbing. Neuro: Alert, answering all questions appropriately. Cranial nerves grossly intact. Skin: No rashes or petechiae noted. Psych: Normal affect.   LAB RESULTS:  Lab Results  Component Value Date   NA 136 06/17/2017   K 3.9 06/17/2017   CL 104 06/17/2017   CO2 25 06/17/2017   GLUCOSE 101 (H) 06/17/2017   BUN 19 06/17/2017   CREATININE 0.95 06/17/2017   CALCIUM 11.2 (H) 06/17/2017   PROT 6.5 03/04/2017   ALBUMIN 4.2 03/04/2017   AST 24 03/04/2017   ALT 16 03/04/2017   ALKPHOS 49 03/04/2017   BILITOT 0.5 03/04/2017   GFRNONAA 55 (L) 06/17/2017   GFRAA >60 06/17/2017    Lab Results  Component Value Date   WBC 6.8 06/17/2017   NEUTROABS 4.1 06/17/2017   HGB 13.0 06/17/2017   HCT 37.8 06/17/2017   MCV 95.5 06/17/2017   PLT 212 06/17/2017   Lab Results  Component Value Date   TOTALPROTELP 6.1 04/16/2017   ALBUMINELP 3.8 04/16/2017   A1GS 0.2 04/16/2017   A2GS 0.7 04/16/2017   BETS 0.9 04/16/2017   GAMS 0.6 04/16/2017   MSPIKE 0.2 (H) 04/16/2017   SPEI Comment 04/16/2017     STUDIES: No results found.  ASSESSMENT:  Multiple myeloma in remission.  PLAN:    1.  Multiple myeloma in  remission: Patient's calcium levels continue to be persistently elevated, therefore will proceed with Zometa. Patient's M spike appears to fluctuate between 0.0 and 0.2.  Although, her lambda free light chains seem to have trended up over the past 4 months. Previously, bone marrow biopsy had 44% plasma cells with normal cytogenetics. Given the results of her metastatic bone survey, her elevated lambda free chains, and her bone marrow biopsy, patient fit the criteria for multiple myeloma. Because of patient's peripheral neuropathy, Revlimid was discontinued.  We may need to restart treatment in the near future though. Maintenance treatment was not started given her neuropathy. Patient initiated monthly Zometa on May 07, 2016. Continue 3.3 mg Zometa every 4 weeks. Return to clinic in 4 weeks for laboratory work,  further evaluation and continuation of Zometa.  2. Hypercalcemia: Zometa as above. Monitor.  3. Thrombocytopenia:  Resolved, monitor.  Approximately 30 minutes was spent in discussion of which greater than 50% was consultation.  Patient expressed understanding and was in agreement with this plan. She also understands that She can call clinic at any time with any questions, concerns, or complaints.    Lloyd Huger, MD 06/17/17 2:23 PM

## 2017-06-17 ENCOUNTER — Inpatient Hospital Stay: Payer: Medicare Other

## 2017-06-17 ENCOUNTER — Inpatient Hospital Stay: Payer: Medicare Other | Attending: Oncology | Admitting: Oncology

## 2017-06-17 VITALS — BP 114/69 | HR 77 | Temp 98.7°F | Resp 18 | Wt 127.8 lb

## 2017-06-17 DIAGNOSIS — C9001 Multiple myeloma in remission: Secondary | ICD-10-CM | POA: Diagnosis present

## 2017-06-17 LAB — CBC WITH DIFFERENTIAL/PLATELET
Basophils Absolute: 0.1 10*3/uL (ref 0–0.1)
Basophils Relative: 1 %
Eosinophils Absolute: 0 10*3/uL (ref 0–0.7)
Eosinophils Relative: 0 %
HEMATOCRIT: 37.8 % (ref 35.0–47.0)
Hemoglobin: 13 g/dL (ref 12.0–16.0)
LYMPHS ABS: 1.8 10*3/uL (ref 1.0–3.6)
LYMPHS PCT: 26 %
MCH: 32.9 pg (ref 26.0–34.0)
MCHC: 34.4 g/dL (ref 32.0–36.0)
MCV: 95.5 fL (ref 80.0–100.0)
MONO ABS: 0.8 10*3/uL (ref 0.2–0.9)
MONOS PCT: 12 %
NEUTROS ABS: 4.1 10*3/uL (ref 1.4–6.5)
Neutrophils Relative %: 61 %
Platelets: 212 10*3/uL (ref 150–440)
RBC: 3.96 MIL/uL (ref 3.80–5.20)
RDW: 12.5 % (ref 11.5–14.5)
WBC: 6.8 10*3/uL (ref 3.6–11.0)

## 2017-06-17 LAB — BASIC METABOLIC PANEL
ANION GAP: 7 (ref 5–15)
BUN: 19 mg/dL (ref 6–20)
CALCIUM: 11.2 mg/dL — AB (ref 8.9–10.3)
CHLORIDE: 104 mmol/L (ref 101–111)
CO2: 25 mmol/L (ref 22–32)
Creatinine, Ser: 0.95 mg/dL (ref 0.44–1.00)
GFR calc Af Amer: >60 mL/min (ref >60)
GFR calc non Af Amer: 55 mL/min — ABNORMAL LOW (ref >60)
GLUCOSE: 101 mg/dL — AB (ref 65–99)
POTASSIUM: 3.9 mmol/L (ref 3.5–5.1)
Sodium: 136 mmol/L (ref 135–145)

## 2017-06-17 MED ORDER — SODIUM CHLORIDE 0.9 % IV SOLN
INTRAVENOUS | Status: DC
  Administered 2017-06-17: 15:00:00 via INTRAVENOUS
  Filled 2017-06-17: qty 1000

## 2017-06-17 MED ORDER — ZOLEDRONIC ACID 4 MG/100ML IV SOLN
3.3000 mg | Freq: Once | INTRAVENOUS | Status: AC
  Administered 2017-06-17: 3.3 mg via INTRAVENOUS
  Filled 2017-06-17: qty 100

## 2017-06-18 LAB — KAPPA/LAMBDA LIGHT CHAINS
KAPPA, LAMDA LIGHT CHAIN RATIO: 0 — AB (ref 0.26–1.65)
Kappa free light chain: 8.5 mg/L (ref 3.3–19.4)
LAMDA FREE LIGHT CHAINS: 1793.4 mg/L — AB (ref 5.7–26.3)

## 2017-06-18 LAB — IGG, IGA, IGM
IGA: 29 mg/dL — AB (ref 64–422)
IgG (Immunoglobin G), Serum: 529 mg/dL — ABNORMAL LOW (ref 700–1600)
IgM (Immunoglobulin M), Srm: 13 mg/dL — ABNORMAL LOW (ref 26–217)

## 2017-06-18 LAB — PROTEIN ELECTROPHORESIS, SERUM
A/G RATIO SPE: 1.7 (ref 0.7–1.7)
ALBUMIN ELP: 3.8 g/dL (ref 2.9–4.4)
Alpha-1-Globulin: 0.2 g/dL (ref 0.0–0.4)
Alpha-2-Globulin: 0.7 g/dL (ref 0.4–1.0)
Beta Globulin: 0.9 g/dL (ref 0.7–1.3)
GLOBULIN, TOTAL: 2.3 g/dL (ref 2.2–3.9)
Gamma Globulin: 0.5 g/dL (ref 0.4–1.8)
M-Spike, %: 0.2 g/dL — ABNORMAL HIGH
TOTAL PROTEIN ELP: 6.1 g/dL (ref 6.0–8.5)

## 2017-06-19 ENCOUNTER — Ambulatory Visit: Payer: Medicare Other | Admitting: Internal Medicine

## 2017-06-19 ENCOUNTER — Other Ambulatory Visit: Payer: Self-pay

## 2017-06-19 ENCOUNTER — Encounter: Payer: Self-pay | Admitting: Internal Medicine

## 2017-06-19 VITALS — BP 134/67 | HR 74 | Resp 16 | Ht 62.0 in | Wt 126.6 lb

## 2017-06-19 DIAGNOSIS — I10 Essential (primary) hypertension: Secondary | ICD-10-CM

## 2017-06-19 DIAGNOSIS — C9001 Multiple myeloma in remission: Secondary | ICD-10-CM | POA: Diagnosis not present

## 2017-06-19 DIAGNOSIS — N390 Urinary tract infection, site not specified: Secondary | ICD-10-CM

## 2017-06-19 DIAGNOSIS — R300 Dysuria: Secondary | ICD-10-CM

## 2017-06-19 DIAGNOSIS — Z9049 Acquired absence of other specified parts of digestive tract: Secondary | ICD-10-CM | POA: Diagnosis not present

## 2017-06-19 DIAGNOSIS — K219 Gastro-esophageal reflux disease without esophagitis: Secondary | ICD-10-CM | POA: Diagnosis not present

## 2017-06-19 LAB — POCT URINALYSIS DIPSTICK
Bilirubin, UA: NEGATIVE
Glucose, UA: NEGATIVE
KETONES UA: NEGATIVE
NITRITE UA: NEGATIVE
PH UA: 7 (ref 5.0–8.0)
PROTEIN UA: 100
Spec Grav, UA: 1.01 (ref 1.010–1.025)
UROBILINOGEN UA: 0.2 U/dL

## 2017-06-19 MED ORDER — PNEUMOCOCCAL 13-VAL CONJ VACC IM SUSP: 0.5000 mL | Freq: Once | INTRAMUSCULAR | 0 refills | Status: AC

## 2017-06-19 MED ORDER — FLUCONAZOLE 150 MG PO TABS: 150.0000 mg | ORAL_TABLET | Freq: Every day | ORAL | 0 refills | Status: DC

## 2017-06-19 NOTE — Progress Notes (Signed)
[facility] [place], [place] [address]  Internal MEDICINE  Office Visit Note  Patient Name: Rhonda [name]  [id number]  [id number]  Date of Service: 06/19/2017     Complaints/HPI Pt is here for routine follow up.  C/o burning and decreased urination. She developed a tooth abcess in december and was treated with antibiotics. Recent visit with hematology for MM S/P Cholecystectomy    Current Medication: Outpatient Encounter Medications as of 06/19/2017  Medication Sig  . aspirin EC 81 MG tablet Take 81 mg by mouth daily.  . Cyanocobalamin (B-12 PO) Take 500 mcg by mouth daily.   . fexofenadine (ALLEGRA) 30 MG tablet Take 30 mg by mouth daily.   Marland Kitchen levothyroxine (SYNTHROID, LEVOTHROID) 50 MCG tablet Take 50 mcg by mouth daily before breakfast.   . Multiple Vitamins-Minerals (CENTRUM SILVER PO) Take 1 tablet by mouth daily.  . RESTASIS 0.05 % ophthalmic emulsion Place 1 drop into both eyes daily.   Marland Kitchen triamcinolone (NASACORT ALLERGY 24HR) 55 MCG/ACT AERO nasal inhaler Place 2 sprays into the nose 2 (two) times daily as needed.   Marland Kitchen UNABLE TO FIND Inject 1 Dose as directed once a week. Allergy injections once a week   . Potassium Chloride ER 20 MEQ TBCR Take 1 tablet by mouth 2 (two) times daily.   No facility-administered encounter medications on file as of 06/19/2017.     Surgical History: Past Surgical History:  Procedure Laterality Date  . BREAST EXCISIONAL BIOPSY Right 15+ yrs ago   EXCISIONAL - NEG  . CATARACT EXTRACTION W/ INTRAOCULAR LENS IMPLANT    . CHOLECYSTECTOMY N/A 03/14/2017   Procedure: LAPAROSCOPIC CHOLECYSTECTOMY;  Surgeon: Florene Glen, MD;  Location: [facility];  Service: General;  Laterality: N/A;  . COLONOSCOPY WITH PROPOFOL N/A 02/21/2015   Procedure: COLONOSCOPY WITH PROPOFOL;  Surgeon: Lucilla Lame, MD;  Location: [place];  Service: Endoscopy;  Laterality: N/A;  . [place]  . POLYPECTOMY   02/21/2015   Procedure: POLYPECTOMY;  Surgeon: Lucilla Lame, MD;  Location: [place];  Service: Endoscopy;;    Medical History: Past Medical History:  Diagnosis Date  . Arthritis    hands  . Benign neoplasm of ascending colon   . Cataract   . GERD (gastroesophageal reflux disease)   . Hyperlipemia   . Hypertension 01/31/2015  . Hypothyroidism   . Multiple myeloma ([place]) 04/18/2016  . Multiple myeloma in remission ([place]) 04/18/2016  . Osteoporosis   . Skin cancer    basal cell    Family History: Family History  Problem Relation Age of Onset  . Heart disease Mother   . Stroke Father   . Heart disease Brother   . Kidney cancer Neg Hx   . Prostate cancer Neg Hx   . Bladder Cancer Neg Hx   . Breast cancer Neg Hx     Social History   Socioeconomic History  . Marital status: Widowed    Spouse name: Not on file  . Number of children: Not on file  . Years of education: Not on file  . Highest education level: Not on file  Social Needs  . Financial resource strain: Not on file  . Food insecurity - worry: Not on file  . Food insecurity - inability: Not on file  . Transportation needs - medical: Not on file  . Transportation needs - non-medical: Not on file  Occupational History  . Not on file  Tobacco Use  .  Smoking status: Never Smoker  . Smokeless tobacco: Never Used  Substance and Sexual Activity  . Alcohol use: No    Alcohol/week: 0.0 oz  . Drug use: No  . Sexual activity: Not on file  Other Topics Concern  . Not on file  Social History Narrative  . Not on file      Review of Systems  Constitutional: Negative for appetite change, chills, fatigue and unexpected weight change.  HENT: Negative for congestion, ear pain, postnasal drip, rhinorrhea, sinus pressure, sinus pain, sore throat, tinnitus and trouble swallowing.   Eyes: Negative for photophobia, discharge, redness, itching and visual disturbance.  Respiratory: Negative for apnea, cough, choking,  chest tightness, shortness of breath and wheezing.   Cardiovascular: Negative for chest pain, palpitations and leg swelling.  Gastrointestinal: Negative for abdominal distention, blood in stool, constipation, diarrhea, nausea and vomiting.  Endocrine: Negative for polydipsia, polyphagia and polyuria.  Genitourinary: Positive for dysuria and frequency. Negative for difficulty urinating, dyspareunia, flank pain, menstrual problem, pelvic pain and vaginal bleeding.  Musculoskeletal: Negative for arthralgias, back pain, gait problem, joint swelling, myalgias and neck pain.  Allergic/Immunologic: Negative for environmental allergies and food allergies.  Neurological: Negative for dizziness, tremors, syncope, weakness and headaches.  Hematological: Negative for adenopathy. Does not bruise/bleed easily.  Psychiatric/Behavioral: Negative for agitation, dysphoric mood, hallucinations, self-injury and suicidal ideas. The patient is not nervous/anxious.     Vital Signs: BP 134/67 (BP Location: Left Arm, Patient Position: Sitting)   Pulse 74   Resp 16   Ht '5\' 2"'  (1.575 m)   Wt 126 lb 9.6 oz (57.4 kg)   SpO2 97%   BMI 23.16 kg/m    Physical Exam  Constitutional: She is oriented to person, place, and time. She appears well-developed and well-nourished. No distress.  HENT:  Head: Normocephalic and atraumatic.  Mouth/Throat: Oropharynx is clear and moist. No oropharyngeal exudate.  Eyes: EOM are normal. Pupils are equal, round, and reactive to light.  Neck: Normal range of motion. Neck supple. No JVD present. No tracheal deviation present. No thyromegaly present.  Cardiovascular: Normal rate, regular rhythm and normal heart sounds. Exam reveals no gallop and no friction rub.  No murmur heard. Pulmonary/Chest: Effort normal. No respiratory distress. She has no wheezes. She has no rales. She exhibits no tenderness.  Abdominal: Soft. Bowel sounds are normal.  Musculoskeletal: Normal range of motion.   Lymphadenopathy:    She has no cervical adenopathy.  Neurological: She is alert and oriented to person, place, and time. No cranial nerve deficit.  Skin: Skin is warm and dry. She is not diaphoretic.  Psychiatric: She has a normal mood and affect. Her behavior is normal. Judgment and thought content normal.      Assessment/Plan: 1. Essential hypertension - Controlled with medications  2. Gastroesophageal reflux disease without esophagitis - Improved after cholecystectomy  3. Dysuria - POCT urinalysis dipstick  4. Multiple myeloma in remission [facility]) - Per Hematology  5. History of cholecystectomy - Resolved     General Counseling: I have discussed the findings of the evaluation and examination with Luellen Pucker.  I have also discussed any further diagnostic evaluation that may be needed or ordered today. [place] verbalizes understanding of the findings of todays visit. We also reviewed her medications today. she has been encouraged to call the office with any questions or concerns that should arise related to todays visit.   Time spent: 25    Dr Lavera Guise Internal medicine

## 2017-06-19 NOTE — Addendum Note (Signed)
Addended byWaynard Edwards on: 06/19/2017 02:46 PM   Modules accepted: Orders

## 2017-06-21 ENCOUNTER — Telehealth: Payer: Self-pay | Admitting: Internal Medicine

## 2017-06-21 NOTE — Progress Notes (Signed)
Tell her to finish her antibiotics

## 2017-06-22 LAB — CULTURE, URINE COMPREHENSIVE

## 2017-06-24 ENCOUNTER — Ambulatory Visit: Payer: Medicare Other

## 2017-06-24 ENCOUNTER — Telehealth: Payer: Self-pay

## 2017-06-24 ENCOUNTER — Other Ambulatory Visit: Payer: Self-pay

## 2017-06-24 ENCOUNTER — Other Ambulatory Visit: Payer: Medicare Other

## 2017-06-24 ENCOUNTER — Ambulatory Visit: Payer: Medicare Other | Admitting: Oncology

## 2017-06-24 MED ORDER — LEVOFLOXACIN 500 MG PO TABS: 500.0000 mg | ORAL_TABLET | Freq: Every day | ORAL | 0 refills | Status: AC

## 2017-06-24 NOTE — Telephone Encounter (Signed)
Pt called saying that she has a cough and sore throat.   Talked to Dr. Clayborn Bigness and levaquin 500 mg po qd for 7 days #7.   I called pt back to let her know this medication was called in and how long she should take it.

## 2017-07-19 NOTE — Progress Notes (Signed)
[place]  Telephone:(336) [phone] Fax:(336) [phone]  ID: Rhonda [name] OB: May 19, 1934  MR#: [id number]  WNU#:[id number]  Patient Care Team: Rhonda Guise, MD as PCP - General (Internal Medicine)  CHIEF COMPLAINT:  Multiple myeloma in remission.  INTERVAL HISTORY: Patient returns to clinic today for further evaluation and continuation of Zometa. The neuropathy in her feet has resolved. She currently feels well and is asymptomatic. She has no neurologic complaints. She denies any recent fevers or illnesses. She has a good appetite and denies weight loss. She denies any chest [name] or shortness of breath. She denies any nausea, vomiting, constipation, or diarrhea.  She has no hematuria, melena, or hematochezia. She has no urinary complaints.  Patient offers no specific complaints today.   REVIEW OF SYSTEMS:   Review of Systems  Constitutional: Negative.  Negative for fever, malaise/fatigue and weight loss.  HENT: Negative for congestion and sore throat.   Respiratory: Negative for cough and shortness of breath.   Cardiovascular: Negative for chest [name], palpitations and leg swelling.  Gastrointestinal: Negative for abdominal [name], blood in stool, constipation, diarrhea, melena, nausea and vomiting.  Genitourinary: Negative.   Musculoskeletal: Negative.   Neurological: Negative for dizziness, tingling, sensory change, weakness and headaches.  Psychiatric/Behavioral: Negative.  The patient is not nervous/anxious and does not have insomnia.     As per HPI. Otherwise, a complete review of systems is negative.  PAST MEDICAL HISTORY: Past Medical History:  Diagnosis Date  . Arthritis    hands  . Benign neoplasm of ascending colon   . Cataract   . GERD (gastroesophageal reflux disease)   . Hyperlipemia   . Hypertension 01/31/2015  . Hypothyroidism   . Multiple myeloma ([place]) 04/18/2016  . Multiple myeloma in remission ([place]) 04/18/2016  . Osteoporosis   . Skin  cancer    basal cell    PAST SURGICAL HISTORY: Past Surgical History:  Procedure Laterality Date  . BREAST EXCISIONAL BIOPSY Right 15+ yrs ago   EXCISIONAL - NEG  . CATARACT EXTRACTION W/ INTRAOCULAR LENS IMPLANT    . CHOLECYSTECTOMY N/A 03/14/2017   Procedure: LAPAROSCOPIC CHOLECYSTECTOMY;  Surgeon: Florene Glen, MD;  Location: [facility];  Service: General;  Laterality: N/A;  . COLONOSCOPY WITH PROPOFOL N/A 02/21/2015   Procedure: COLONOSCOPY WITH PROPOFOL;  Surgeon: Lucilla Lame, MD;  Location: [place];  Service: Endoscopy;  Laterality: N/A;  . [place]  . POLYPECTOMY  02/21/2015   Procedure: POLYPECTOMY;  Surgeon: Lucilla Lame, MD;  Location: [place];  Service: Endoscopy;;    FAMILY HISTORY: Family History  Problem Relation Age of Onset  . Heart disease Mother   . Stroke Father   . Heart disease Brother   . Kidney cancer Neg Hx   . Prostate cancer Neg Hx   . Bladder Cancer Neg Hx   . Breast cancer Neg Hx     ADVANCED DIRECTIVES (Y/N):  N  HEALTH MAINTENANCE: Social History   Tobacco Use  . Smoking status: Never Smoker  . Smokeless tobacco: Never Used  Substance Use Topics  . Alcohol use: No    Alcohol/week: 0.0 oz  . Drug use: No     Colonoscopy:  PAP:  Bone density:  Lipid panel:  No Known Allergies  Current Outpatient Medications  Medication Sig Dispense Refill  . aspirin EC 81 MG tablet Take 81 mg by mouth daily.    . Cyanocobalamin (B-12 PO) Take 500 mcg by mouth daily.     Marland Kitchen  fexofenadine (ALLEGRA) 30 MG tablet Take 30 mg by mouth daily.     . fluconazole (DIFLUCAN) 150 MG tablet Take 1 tablet (150 mg total) by mouth daily. 3 tablet 0  . levothyroxine (SYNTHROID, LEVOTHROID) 50 MCG tablet Take 50 mcg by mouth daily before breakfast.     . Multiple Vitamins-Minerals (CENTRUM SILVER PO) Take 1 tablet by mouth daily.    . RESTASIS 0.05 % ophthalmic emulsion Place 1 drop into both eyes daily.     Marland Kitchen  triamcinolone (NASACORT ALLERGY 24HR) 55 MCG/ACT AERO nasal inhaler Place 2 sprays into the nose 2 (two) times daily as needed.     Marland Kitchen UNABLE TO FIND Inject 1 Dose as directed once a week. Allergy injections once a week     . Potassium Chloride ER 20 MEQ TBCR Take 1 tablet by mouth 2 (two) times daily. 10 tablet 0   No current facility-administered medications for this visit.     OBJECTIVE: Vitals:   07/22/17 1445  BP: 109/70  Pulse: 78  Resp: 16  Temp: 98.1 F (36.7 C)     Body mass index is 23.32 kg/m.    ECOG FS:0 - Asymptomatic  General: Well-developed, well-nourished, no acute distress. Eyes: Pink conjunctiva, anicteric sclera. Lungs: Clear to auscultation bilaterally. Heart: Regular rate and rhythm. No rubs, murmurs, or gallops. Abdomen: Soft, nontender, nondistended. No organomegaly noted, normoactive bowel sounds. Musculoskeletal: No edema, cyanosis, or clubbing. Neuro: Alert, answering all questions appropriately. Cranial nerves grossly intact. Skin: No rashes or petechiae noted. Psych: Normal affect.   LAB RESULTS:  Lab Results  Component Value Date   NA 139 07/22/2017   K 4.2 07/22/2017   CL 108 07/22/2017   CO2 26 07/22/2017   GLUCOSE 111 (H) 07/22/2017   BUN 17 07/22/2017   CREATININE 0.81 07/22/2017   CALCIUM 10.5 (H) 07/22/2017   PROT 6.5 03/04/2017   ALBUMIN 4.2 03/04/2017   AST 24 03/04/2017   ALT 16 03/04/2017   ALKPHOS 49 03/04/2017   BILITOT 0.5 03/04/2017   GFRNONAA >60 07/22/2017   GFRAA >60 07/22/2017    Lab Results  Component Value Date   WBC 5.5 07/22/2017   NEUTROABS 3.4 07/22/2017   HGB 12.5 07/22/2017   HCT 36.7 07/22/2017   MCV 96.8 07/22/2017   PLT 174 07/22/2017   Lab Results  Component Value Date   TOTALPROTELP 6.2 07/22/2017   ALBUMINELP 3.8 07/22/2017   A1GS 0.2 07/22/2017   A2GS 0.7 07/22/2017   BETS 0.9 07/22/2017   GAMS 0.6 07/22/2017   MSPIKE 0.2 (H) 07/22/2017   SPEI Comment 07/22/2017     STUDIES: No  results found.  ASSESSMENT:  Multiple myeloma in remission.  PLAN:    1.  Multiple myeloma in remission: Patient's calcium levels continue to be persistently elevated, therefore will proceed with Zometa. Patient's M spike appears to fluctuate between 0.0 and 0.2.  Her lambda free light chains are elevated, but seems to be relatively stable. Previously, bone marrow biopsy had 44% plasma cells with normal cytogenetics. Given the results of her metastatic bone survey, her elevated lambda free chains, and her bone marrow biopsy, patient fit the criteria for multiple myeloma. Because of patient's peripheral neuropathy, Revlimid was discontinued.  We may need to restart treatment in the near future though. Maintenance treatment was not started given her neuropathy. Patient initiated monthly Zometa on May 07, 2016. Continue 3.3 mg Zometa every 4 weeks. Return to clinic in 4 weeks for laboratory work and Zometa  and then in 8 weeks for further evaluation and continuation of Zometa.  2. Hypercalcemia: Zometa as above. Monitor.  3. Thrombocytopenia: Resolved, monitor.  Approximately 30 minutes was spent in discussion of which greater than 50% was consultation.  Patient expressed understanding and was in agreement with this plan. She also understands that She can call clinic at any time with any questions, concerns, or complaints.    Lloyd Huger, MD 07/25/17 10:42 AM

## 2017-07-22 ENCOUNTER — Inpatient Hospital Stay (HOSPITAL_BASED_OUTPATIENT_CLINIC_OR_DEPARTMENT_OTHER): Payer: Medicare Other | Admitting: Oncology

## 2017-07-22 ENCOUNTER — Inpatient Hospital Stay: Payer: Medicare Other | Attending: Oncology | Admitting: *Deleted

## 2017-07-22 ENCOUNTER — Inpatient Hospital Stay: Payer: Medicare Other

## 2017-07-22 VITALS — BP 109/70 | HR 78 | Temp 98.1°F | Resp 16 | Wt 127.5 lb

## 2017-07-22 DIAGNOSIS — E039 Hypothyroidism, unspecified: Secondary | ICD-10-CM | POA: Diagnosis not present

## 2017-07-22 DIAGNOSIS — C9000 Multiple myeloma not having achieved remission: Secondary | ICD-10-CM

## 2017-07-22 DIAGNOSIS — Z79899 Other long term (current) drug therapy: Secondary | ICD-10-CM | POA: Insufficient documentation

## 2017-07-22 DIAGNOSIS — C9001 Multiple myeloma in remission: Secondary | ICD-10-CM

## 2017-07-22 LAB — CBC WITH DIFFERENTIAL/PLATELET
BASOS PCT: 1 %
Basophils Absolute: 0 10*3/uL (ref 0–0.1)
Eosinophils Absolute: 0 10*3/uL (ref 0–0.7)
Eosinophils Relative: 1 %
HEMATOCRIT: 36.7 % (ref 35.0–47.0)
HEMOGLOBIN: 12.5 g/dL (ref 12.0–16.0)
LYMPHS ABS: 1.4 10*3/uL (ref 1.0–3.6)
Lymphocytes Relative: 26 %
MCH: 33 pg (ref 26.0–34.0)
MCHC: 34.1 g/dL (ref 32.0–36.0)
MCV: 96.8 fL (ref 80.0–100.0)
MONOS PCT: 11 %
Monocytes Absolute: 0.6 10*3/uL (ref 0.2–0.9)
NEUTROS ABS: 3.4 10*3/uL (ref 1.4–6.5)
NEUTROS PCT: 61 %
Platelets: 174 10*3/uL (ref 150–440)
RBC: 3.79 MIL/uL — AB (ref 3.80–5.20)
RDW: 12.9 % (ref 11.5–14.5)
WBC: 5.5 10*3/uL (ref 3.6–11.0)

## 2017-07-22 LAB — BASIC METABOLIC PANEL
ANION GAP: 5 (ref 5–15)
BUN: 17 mg/dL (ref 6–20)
CALCIUM: 10.5 mg/dL — AB (ref 8.9–10.3)
CHLORIDE: 108 mmol/L (ref 101–111)
CO2: 26 mmol/L (ref 22–32)
Creatinine, Ser: 0.81 mg/dL (ref 0.44–1.00)
GFR calc non Af Amer: >60 mL/min (ref >60)
Glucose, Bld: 111 mg/dL — ABNORMAL HIGH (ref 65–99)
Potassium: 4.2 mmol/L (ref 3.5–5.1)
Sodium: 139 mmol/L (ref 135–145)

## 2017-07-22 MED ORDER — ZOLEDRONIC ACID 4 MG/100ML IV SOLN
3.3000 mg | Freq: Once | INTRAVENOUS | Status: AC
  Administered 2017-07-22: 3.3 mg via INTRAVENOUS
  Filled 2017-07-22: qty 100

## 2017-07-22 MED ORDER — SODIUM CHLORIDE 0.9 % IV SOLN
INTRAVENOUS | Status: DC
  Administered 2017-07-22: 15:00:00 via INTRAVENOUS
  Filled 2017-07-22: qty 1000

## 2017-07-22 NOTE — Progress Notes (Signed)
Pt in today for follow up and zometa.  Pt denies any difficulties or concerns today.

## 2017-07-23 LAB — PROTEIN ELECTROPHORESIS, SERUM
A/G RATIO SPE: 1.6 (ref 0.7–1.7)
ALBUMIN ELP: 3.8 g/dL (ref 2.9–4.4)
ALPHA-2-GLOBULIN: 0.7 g/dL (ref 0.4–1.0)
Alpha-1-Globulin: 0.2 g/dL (ref 0.0–0.4)
BETA GLOBULIN: 0.9 g/dL (ref 0.7–1.3)
GAMMA GLOBULIN: 0.6 g/dL (ref 0.4–1.8)
Globulin, Total: 2.4 g/dL (ref 2.2–3.9)
M-Spike, %: 0.2 g/dL — ABNORMAL HIGH
Total Protein ELP: 6.2 g/dL (ref 6.0–8.5)

## 2017-07-23 LAB — KAPPA/LAMBDA LIGHT CHAINS
Kappa free light chain: 11.6 mg/L (ref 3.3–19.4)
Kappa, lambda light chain ratio: 0.01 — ABNORMAL LOW (ref 0.26–1.65)
Lambda free light chains: 1705.1 mg/L — ABNORMAL HIGH (ref 5.7–26.3)

## 2017-07-23 LAB — IGG, IGA, IGM
IGA: 25 mg/dL — AB (ref 64–422)
IGG (IMMUNOGLOBIN G), SERUM: 525 mg/dL — AB (ref 700–1600)
IGM (IMMUNOGLOBULIN M), SRM: 17 mg/dL — AB (ref 26–217)

## 2017-08-19 ENCOUNTER — Inpatient Hospital Stay: Payer: Medicare Other | Attending: Oncology | Admitting: *Deleted

## 2017-08-19 ENCOUNTER — Inpatient Hospital Stay: Payer: Medicare Other

## 2017-08-19 VITALS — BP 128/72 | HR 77 | Temp 99.0°F | Resp 18

## 2017-08-19 DIAGNOSIS — C9000 Multiple myeloma not having achieved remission: Secondary | ICD-10-CM

## 2017-08-19 DIAGNOSIS — C9001 Multiple myeloma in remission: Secondary | ICD-10-CM | POA: Insufficient documentation

## 2017-08-19 LAB — CBC WITH DIFFERENTIAL/PLATELET
BASOS ABS: 0 10*3/uL (ref 0–0.1)
Basophils Relative: 1 %
EOS PCT: 1 %
Eosinophils Absolute: 0 10*3/uL (ref 0–0.7)
HCT: 35.9 % (ref 35.0–47.0)
Hemoglobin: 12.7 g/dL (ref 12.0–16.0)
LYMPHS PCT: 22 %
Lymphs Abs: 1.4 10*3/uL (ref 1.0–3.6)
MCH: 33.9 pg (ref 26.0–34.0)
MCHC: 35.4 g/dL (ref 32.0–36.0)
MCV: 95.7 fL (ref 80.0–100.0)
Monocytes Absolute: 0.8 10*3/uL (ref 0.2–0.9)
Monocytes Relative: 12 %
NEUTROS ABS: 4.3 10*3/uL (ref 1.4–6.5)
Neutrophils Relative %: 66 %
PLATELETS: 194 10*3/uL (ref 150–440)
RBC: 3.75 MIL/uL — AB (ref 3.80–5.20)
RDW: 13.3 % (ref 11.5–14.5)
WBC: 6.5 10*3/uL (ref 3.6–11.0)

## 2017-08-19 LAB — BASIC METABOLIC PANEL
ANION GAP: 7 (ref 5–15)
BUN: 18 mg/dL (ref 6–20)
CO2: 23 mmol/L (ref 22–32)
Calcium: 10.3 mg/dL (ref 8.9–10.3)
Chloride: 109 mmol/L (ref 101–111)
Creatinine, Ser: 0.69 mg/dL (ref 0.44–1.00)
GFR calc Af Amer: >60 mL/min (ref >60)
Glucose, Bld: 114 mg/dL — ABNORMAL HIGH (ref 65–99)
POTASSIUM: 3.7 mmol/L (ref 3.5–5.1)
SODIUM: 139 mmol/L (ref 135–145)

## 2017-08-19 MED ORDER — ZOLEDRONIC ACID 4 MG/5ML IV CONC
3.3000 mg | Freq: Once | INTRAVENOUS | Status: AC
  Administered 2017-08-19: 3.3 mg via INTRAVENOUS
  Filled 2017-08-19: qty 4.13

## 2017-08-19 MED ORDER — SODIUM CHLORIDE 0.9 % IV SOLN
Freq: Once | INTRAVENOUS | Status: AC
  Administered 2017-08-19: 14:00:00 via INTRAVENOUS
  Filled 2017-08-19: qty 1000

## 2017-08-20 LAB — KAPPA/LAMBDA LIGHT CHAINS
KAPPA, LAMDA LIGHT CHAIN RATIO: 0.01 — AB (ref 0.26–1.65)
Kappa free light chain: 11.2 mg/L (ref 3.3–19.4)
LAMDA FREE LIGHT CHAINS: 1764.7 mg/L — AB (ref 5.7–26.3)

## 2017-08-20 LAB — PROTEIN ELECTROPHORESIS, SERUM
A/G RATIO SPE: 1.7 (ref 0.7–1.7)
ALBUMIN ELP: 3.8 g/dL (ref 2.9–4.4)
ALPHA-1-GLOBULIN: 0.2 g/dL (ref 0.0–0.4)
ALPHA-2-GLOBULIN: 0.6 g/dL (ref 0.4–1.0)
Beta Globulin: 0.8 g/dL (ref 0.7–1.3)
GLOBULIN, TOTAL: 2.2 g/dL (ref 2.2–3.9)
Gamma Globulin: 0.6 g/dL (ref 0.4–1.8)
M-Spike, %: 0.1 g/dL — ABNORMAL HIGH
Total Protein ELP: 6 g/dL (ref 6.0–8.5)

## 2017-09-19 ENCOUNTER — Ambulatory Visit: Payer: Medicare Other | Admitting: Nurse Practitioner

## 2017-09-19 ENCOUNTER — Encounter: Payer: Self-pay | Admitting: Nurse Practitioner

## 2017-09-19 VITALS — BP 141/72 | HR 81 | Temp 98.1°F | Resp 16 | Ht 62.0 in | Wt 127.2 lb

## 2017-09-19 DIAGNOSIS — C9001 Multiple myeloma in remission: Secondary | ICD-10-CM

## 2017-09-19 DIAGNOSIS — A09 Infectious gastroenteritis and colitis, unspecified: Secondary | ICD-10-CM | POA: Diagnosis not present

## 2017-09-19 DIAGNOSIS — E039 Hypothyroidism, unspecified: Secondary | ICD-10-CM | POA: Diagnosis not present

## 2017-09-19 MED ORDER — SULFAMETHOXAZOLE-TRIMETHOPRIM 800-160 MG PO TABS: 1.0000 | ORAL_TABLET | Freq: Two times a day (BID) | ORAL | 0 refills | Status: DC

## 2017-09-19 MED ORDER — DIPHENOXYLATE-ATROPINE 2.5-0.025 MG PO TABS: 1.0000 | ORAL_TABLET | Freq: Four times a day (QID) | ORAL | 0 refills | Status: DC | PRN

## 2017-09-19 NOTE — Progress Notes (Signed)
[facility] [place], [place] [address]  Internal MEDICINE  Office Visit Note  Patient Name: Rhonda [name]  [id number]  [id number]  Date of Service: 10/09/2017  Pt is here for a sick visit.  Chief Complaint  Patient presents with  . Diarrhea    started last week. no fever, chills started last night     The patient is here for sick visit. She has been having diarrhea since last week. Now, everything she eats is going right through her. Stool is like "muddy water." no cramping, but stomach and intestines are making a lot of noise. Increased fluid intake .       Current Medication:  Outpatient Encounter Medications as of 09/19/2017  Medication Sig  . aspirin EC 81 MG tablet Take 81 mg by mouth daily.  . Cyanocobalamin (B-12 PO) Take 500 mcg by mouth daily.   Marland Kitchen EPINEPHrine 0.3 mg/0.3 mL IJ SOAJ injection   . fexofenadine (ALLEGRA) 30 MG tablet Take 30 mg by mouth daily.   . fluconazole (DIFLUCAN) 150 MG tablet Take 1 tablet (150 mg total) by mouth daily.  Marland Kitchen levothyroxine (SYNTHROID, LEVOTHROID) 50 MCG tablet Take 50 mcg by mouth daily before breakfast.   . Multiple Vitamins-Minerals (CENTRUM SILVER PO) Take 1 tablet by mouth daily.  . RESTASIS 0.05 % ophthalmic emulsion Place 1 drop into both eyes daily.   Marland Kitchen triamcinolone (NASACORT ALLERGY 24HR) 55 MCG/ACT AERO nasal inhaler Place 2 sprays into the nose 2 (two) times daily as needed.   Marland Kitchen UNABLE TO FIND Inject 1 Dose as directed once a week. Allergy injections once a week   . diphenoxylate-atropine (LOMOTIL) 2.5-0.025 MG tablet Take 1 tablet by mouth 4 (four) times daily as needed for diarrhea or loose stools.  Marland Kitchen sulfamethoxazole-trimethoprim (BACTRIM DS,SEPTRA DS) 800-160 MG tablet Take 1 tablet by mouth 2 (two) times daily.  . [DISCONTINUED] Potassium Chloride ER 20 MEQ TBCR Take 1 tablet by mouth 2 (two) times daily.   No facility-administered encounter medications on file as of 09/19/2017.        Medical History: Past Medical History:  Diagnosis Date  . Arthritis    hands  . Benign neoplasm of ascending colon   . Cataract   . GERD (gastroesophageal reflux disease)   . Hyperlipemia   . Hypertension 01/31/2015  . Hypothyroidism   . Multiple myeloma ([place]) 04/18/2016  . Multiple myeloma in remission ([place]) 04/18/2016  . Osteoporosis   . Skin cancer    basal cell     Today's Vitals   09/19/17 1600  BP: (!) 141/72  Pulse: 81  Resp: 16  Temp: 98.1 F (36.7 C)  SpO2: 97%  Weight: 127 lb 3.2 oz (57.7 kg)  Height: '5\' 2"'  (1.575 m)    Review of Systems  Constitutional: Positive for appetite change, chills and fatigue. Negative for fever and unexpected weight change.  HENT: Negative for congestion, postnasal drip, rhinorrhea, sneezing and sore throat.   Eyes: Negative.  Negative for redness.  Respiratory: Negative for cough, chest tightness, shortness of breath and wheezing.   Cardiovascular: Negative for chest pain and palpitations.  Gastrointestinal: Positive for abdominal pain, diarrhea and nausea. Negative for constipation and vomiting.       Intermittent abdominal cramping.   Endocrine: Negative for cold intolerance, heat intolerance, polydipsia, polyphagia and polyuria.  Genitourinary: Negative for dysuria and frequency.  Musculoskeletal: Negative for arthralgias, back pain, joint swelling and neck pain.  Skin: Negative for rash.  Allergic/Immunologic:  Negative for environmental allergies.  Neurological: Positive for headaches. Negative for tremors and numbness.  Hematological: Negative for adenopathy. Does not bruise/bleed easily.  Psychiatric/Behavioral: Negative for behavioral problems (Depression), sleep disturbance and suicidal ideas. The patient is not nervous/anxious.     Physical Exam  Constitutional: She is oriented to person, place, and time. She appears well-developed and well-nourished. She appears ill. No distress.  HENT:  Head: Normocephalic  and atraumatic.  Mouth/Throat: Oropharynx is clear and moist. No oropharyngeal exudate.  Eyes: Pupils are equal, round, and reactive to light. Conjunctivae and EOM are normal.  Neck: Normal range of motion. Neck supple. No JVD present. No tracheal deviation present. No thyromegaly present.  Cardiovascular: Normal rate, regular rhythm and normal heart sounds. Exam reveals no gallop and no friction rub.  No murmur heard. Pulmonary/Chest: Effort normal and breath sounds normal. No respiratory distress. She has no wheezes. She has no rales. She exhibits no tenderness.  Abdominal: Soft. Bowel sounds are increased. There is generalized tenderness.  Musculoskeletal: Normal range of motion.  Lymphadenopathy:    She has no cervical adenopathy.  Neurological: She is alert and oriented to person, place, and time. No cranial nerve deficit.  Skin: Skin is warm and dry. She is not diaphoretic.  Psychiatric: She has a normal mood and affect. Her behavior is normal. Judgment and thought content normal.  Nursing note and vitals reviewed.   Assessment/Plan: 1. Infectious diarrhea Treat with bactrim DS bid for 10 days. BRAT/bland diet recommended. Labs and stool samples ordered.  - Stool C-Diff Toxin Assay - Stool Culture - CBC w/Diff/Platelet - Ova and parasite examination - sulfamethoxazole-trimethoprim (BACTRIM DS,SEPTRA DS) 800-160 MG tablet; Take 1 tablet by mouth 2 (two) times daily.  Dispense: 20 tablet; Refill: 0 - diphenoxylate-atropine (LOMOTIL) 2.5-0.025 MG tablet; Take 1 tablet by mouth 4 (four) times daily as needed for diarrhea or loose stools.  Dispense: 20 tablet; Refill: 0  2. Acquired hypothyroidism Continue levothyroxine as prescribed   3. Multiple myeloma in remission [facility]) Regular visits with oncology as scheduled   General Counseling: Tyreanna verbalizes understanding of the findings of todays visit and agrees with plan of treatment. I have discussed any further diagnostic  evaluation that may be needed or ordered today. We also reviewed her medications today. she has been encouraged to call the office with any questions or concerns that should arise related to todays visit.  The 'BRAT' diet is suggested, then progress to diet as tolerated as symptoms abate. Call if bloody stools, persistent diarrhea, vomiting, fever or abdominal pain.  This patient was seen by Leretha Pol, FNP- C in Collaboration with Dr Lavera Guise as a part of collaborative care agreement   Orders Placed This Encounter  Procedures  . Stool C-Diff Toxin Assay  . Stool Culture  . Ova and parasite examination  . Stool culture  . Ova and parasite examination  . CBC w/Diff/Platelet    Meds ordered this encounter  Medications  . sulfamethoxazole-trimethoprim (BACTRIM DS,SEPTRA DS) 800-160 MG tablet    Sig: Take 1 tablet by mouth 2 (two) times daily.    Dispense:  20 tablet    Refill:  0    Order Specific Question:   Supervising Provider    Answer:   Lavera Guise [[id number]]  . diphenoxylate-atropine (LOMOTIL) 2.5-0.025 MG tablet    Sig: Take 1 tablet by mouth 4 (four) times daily as needed for diarrhea or loose stools.    Dispense:  20 tablet  Refill:  0    Order Specific Question:   Supervising Provider    Answer:   Lavera Guise [[id number]]    Time spent: 15 Minutes

## 2017-09-20 ENCOUNTER — Other Ambulatory Visit: Payer: Self-pay | Admitting: *Deleted

## 2017-09-20 DIAGNOSIS — C9000 Multiple myeloma not having achieved remission: Secondary | ICD-10-CM

## 2017-09-22 NOTE — Progress Notes (Signed)
[place]  [phone] [phone]) [phone]  ID: Rhonda [name] OB: 1934-06-02  MR#: [id number]  YKD#:[id number]  Patient Care Team: Lavera Guise, MD as PCP - General (Internal Medicine)  CHIEF COMPLAINT:  Multiple myeloma in remission.  INTERVAL HISTORY: Patient returns to clinic today for repeat laboratory work, further evaluation, and continuation of Zometa.  She continues to feel well and remains asymptomatic.  Her peripheral neuropathy has resolved and she has no neurologic complaints. She denies any recent fevers or illnesses. She has a good appetite and denies weight loss. She denies any chest [name] or shortness of breath. She denies any nausea, vomiting, constipation, or diarrhea.  She has no hematuria, melena, or hematochezia. She has no urinary complaints.  Patient offers no specific complaints today.   REVIEW OF SYSTEMS:   Review of Systems  Constitutional: Negative.  Negative for fever, malaise/fatigue and weight loss.  HENT: Negative for congestion and sore throat.   Respiratory: Negative.  Negative for cough and shortness of breath.   Cardiovascular: Negative.  Negative for chest [name], palpitations and leg swelling.  Gastrointestinal: Negative.  Negative for abdominal [name], blood in stool, constipation, diarrhea, melena, nausea and vomiting.  Genitourinary: Negative.   Musculoskeletal: Negative.   Skin: Negative.  Negative for rash.  Neurological: Negative.  Negative for tingling, sensory change and weakness.  Psychiatric/Behavioral: Negative.  The patient is not nervous/anxious and does not have insomnia.     As per HPI. Otherwise, a complete review of systems is negative.  PAST MEDICAL HISTORY: Past Medical History:  Diagnosis Date  . Arthritis    hands  . Benign neoplasm of ascending colon   . Cataract   . GERD (gastroesophageal reflux disease)   . Hyperlipemia   . Hypertension 01/31/2015  . Hypothyroidism   . Multiple myeloma  ([place]) 04/18/2016  . Multiple myeloma in remission ([place]) 04/18/2016  . Osteoporosis   . Skin cancer    basal cell    PAST SURGICAL HISTORY: Past Surgical History:  Procedure Laterality Date  . BREAST EXCISIONAL BIOPSY Right 15+ yrs ago   EXCISIONAL - NEG  . CATARACT EXTRACTION W/ INTRAOCULAR LENS IMPLANT    . CHOLECYSTECTOMY N/A 03/14/2017   Procedure: LAPAROSCOPIC CHOLECYSTECTOMY;  Surgeon: Florene Glen, MD;  Location: [facility] ORS;  Service: General;  Laterality: N/A;  . COLONOSCOPY WITH PROPOFOL N/A 02/21/2015   Procedure: COLONOSCOPY WITH PROPOFOL;  Surgeon: Lucilla Lame, MD;  Location: [place];  Service: Endoscopy;  Laterality: N/A;  . [place]  . POLYPECTOMY  02/21/2015   Procedure: POLYPECTOMY;  Surgeon: Lucilla Lame, MD;  Location: [place];  Service: Endoscopy;;    FAMILY HISTORY: Family History  Problem Relation Age of Onset  . Heart disease Mother   . Stroke Father   . Heart disease Brother   . Kidney cancer Neg Hx   . Prostate cancer Neg Hx   . Bladder Cancer Neg Hx   . Breast cancer Neg Hx     ADVANCED DIRECTIVES (Y/N):  N  HEALTH MAINTENANCE: Social History   Tobacco Use  . Smoking status: Never Smoker  . Smokeless tobacco: Never Used  Substance Use Topics  . Alcohol use: No    Alcohol/week: 0.0 oz  . Drug use: No     Colonoscopy:  PAP:  Bone density:  Lipid panel:  No Known Allergies  Current Outpatient Medications  Medication Sig Dispense Refill  . aspirin EC 81 MG tablet Take 81 mg  by mouth daily.    . Cyanocobalamin (B-12 PO) Take 500 mcg by mouth daily.     . diphenoxylate-atropine (LOMOTIL) 2.5-0.025 MG tablet Take 1 tablet by mouth 4 (four) times daily as needed for diarrhea or loose stools. 20 tablet 0  . EPINEPHrine 0.3 mg/0.3 mL IJ SOAJ injection     . fexofenadine (ALLEGRA) 30 MG tablet Take 30 mg by mouth daily.     . fluconazole (DIFLUCAN) 150 MG tablet Take 1 tablet (150 mg total) by mouth  daily. 3 tablet 0  . levothyroxine (SYNTHROID, LEVOTHROID) 50 MCG tablet Take 50 mcg by mouth daily before breakfast.     . Multiple Vitamins-Minerals (CENTRUM SILVER PO) Take 1 tablet by mouth daily.    . RESTASIS 0.05 % ophthalmic emulsion Place 1 drop into both eyes daily.     Marland Kitchen sulfamethoxazole-trimethoprim (BACTRIM DS,SEPTRA DS) 800-160 MG tablet Take 1 tablet by mouth 2 (two) times daily. 20 tablet 0  . triamcinolone (NASACORT ALLERGY 24HR) 55 MCG/ACT AERO nasal inhaler Place 2 sprays into the nose 2 (two) times daily as needed.     Marland Kitchen UNABLE TO FIND Inject 1 Dose as directed once a week. Allergy injections once a week      No current facility-administered medications for this visit.     OBJECTIVE: Vitals:   09/23/17 1430 09/23/17 1433  BP:  115/66  Pulse:  75  Resp: 12   Temp:  98.2 F (36.8 C)     Body mass index is 23.61 kg/m.    ECOG FS:0 - Asymptomatic  General: Well-developed, well-nourished, no acute distress. Eyes: Pink conjunctiva, anicteric sclera. Lungs: Clear to auscultation bilaterally. Heart: Regular rate and rhythm. No rubs, murmurs, or gallops. Abdomen: Soft, nontender, nondistended. No organomegaly noted, normoactive bowel sounds. Musculoskeletal: No edema, cyanosis, or clubbing. Neuro: Alert, answering all questions appropriately. Cranial nerves grossly intact. Skin: No rashes or petechiae noted. Psych: Normal affect.  LAB RESULTS:  Lab Results  Component Value Date   NA 134 (L) 09/23/2017   K 3.7 09/23/2017   CL 108 09/23/2017   CO2 20 (L) 09/23/2017   GLUCOSE 116 (H) 09/23/2017   BUN 15 09/23/2017   CREATININE 1.00 09/23/2017   CALCIUM 10.2 09/23/2017   PROT 6.5 03/04/2017   ALBUMIN 4.2 03/04/2017   AST 24 03/04/2017   ALT 16 03/04/2017   ALKPHOS 49 03/04/2017   BILITOT 0.5 03/04/2017   GFRNONAA 51 (L) 09/23/2017   GFRAA 60 (L) 09/23/2017    Lab Results  Component Value Date   WBC 6.0 09/23/2017   NEUTROABS 3.8 09/23/2017   HGB 12.2  09/23/2017   HCT 34.1 (L) 09/23/2017   MCV 94.7 09/23/2017   PLT 227 09/23/2017   Lab Results  Component Value Date   TOTALPROTELP 6.0 08/19/2017   ALBUMINELP 3.8 08/19/2017   A1GS 0.2 08/19/2017   A2GS 0.6 08/19/2017   BETS 0.8 08/19/2017   GAMS 0.6 08/19/2017   MSPIKE 0.1 (H) 08/19/2017   SPEI Comment 08/19/2017     STUDIES: No results found.  ASSESSMENT:  Multiple myeloma in remission.  PLAN:    1.  Multiple myeloma in remission: Patient's calcium levels are now within normal limits. Patient's M spike appears to fluctuate between 0.0 and 0.2, today's result is pending.  Her lambda free light chains are elevated, but seems to be relatively stable.  IgG, IgM, and IgA are all mildly decreased.  Previously, bone marrow biopsy had 44% plasma cells with normal cytogenetics. Given  the results of her metastatic bone survey, her elevated lambda free chains, and her bone marrow biopsy, patient fit the criteria for multiple myeloma. Because of patient's peripheral neuropathy, Revlimid was discontinued and patient did not receive maintenance treatment. Her neuropathy has now resolved, so can reuse Revlimid in the future if necessary. Patient initiated monthly Zometa on May 07, 2016. Continue 3.3 mg Zometa every 4 weeks. Return to clinic in 4 weeks for laboratory work and Zometa and then in 8 weeks for further evaluation and continuation of Zometa.  2. Hypercalcemia: Patient's calcium is 10.2 today.  Continue Zometa as above.    Approximately 30 minutes spent in discussion of which greater than 50% was consultation.   Patient expressed understanding and was in agreement with this plan. She also understands that She can call clinic at any time with any questions, concerns, or complaints.    Lloyd Huger, MD 09/24/17 10:05 AM

## 2017-09-23 ENCOUNTER — Other Ambulatory Visit: Payer: Self-pay

## 2017-09-23 ENCOUNTER — Inpatient Hospital Stay: Payer: Medicare Other | Attending: Oncology

## 2017-09-23 ENCOUNTER — Encounter: Payer: Self-pay | Admitting: Oncology

## 2017-09-23 ENCOUNTER — Inpatient Hospital Stay (HOSPITAL_BASED_OUTPATIENT_CLINIC_OR_DEPARTMENT_OTHER): Payer: Medicare Other | Admitting: Oncology

## 2017-09-23 ENCOUNTER — Inpatient Hospital Stay: Payer: Medicare Other

## 2017-09-23 VITALS — BP 115/66 | HR 75 | Temp 98.2°F | Resp 12 | Ht 62.0 in | Wt 129.1 lb

## 2017-09-23 DIAGNOSIS — C9001 Multiple myeloma in remission: Secondary | ICD-10-CM

## 2017-09-23 DIAGNOSIS — I10 Essential (primary) hypertension: Secondary | ICD-10-CM | POA: Insufficient documentation

## 2017-09-23 DIAGNOSIS — E039 Hypothyroidism, unspecified: Secondary | ICD-10-CM | POA: Insufficient documentation

## 2017-09-23 DIAGNOSIS — C9000 Multiple myeloma not having achieved remission: Secondary | ICD-10-CM

## 2017-09-23 LAB — BASIC METABOLIC PANEL
Anion gap: 6 (ref 5–15)
BUN: 15 mg/dL (ref 6–20)
CALCIUM: 10.2 mg/dL (ref 8.9–10.3)
CHLORIDE: 108 mmol/L (ref 101–111)
CO2: 20 mmol/L — AB (ref 22–32)
CREATININE: 1 mg/dL (ref 0.44–1.00)
GFR calc non Af Amer: 51 mL/min — ABNORMAL LOW (ref >60)
GFR, EST AFRICAN AMERICAN: 60 mL/min — AB (ref >60)
GLUCOSE: 116 mg/dL — AB (ref 65–99)
Potassium: 3.7 mmol/L (ref 3.5–5.1)
Sodium: 134 mmol/L — ABNORMAL LOW (ref 135–145)

## 2017-09-23 LAB — CBC WITH DIFFERENTIAL/PLATELET
BASOS PCT: 1 %
Basophils Absolute: 0 10*3/uL (ref 0–0.1)
EOS PCT: 1 %
Eosinophils Absolute: 0 10*3/uL (ref 0–0.7)
HCT: 34.1 % — ABNORMAL LOW (ref 35.0–47.0)
HEMOGLOBIN: 12.2 g/dL (ref 12.0–16.0)
LYMPHS ABS: 1.3 10*3/uL (ref 1.0–3.6)
Lymphocytes Relative: 22 %
MCH: 33.8 pg (ref 26.0–34.0)
MCHC: 35.7 g/dL (ref 32.0–36.0)
MCV: 94.7 fL (ref 80.0–100.0)
MONO ABS: 0.8 10*3/uL (ref 0.2–0.9)
MONOS PCT: 13 %
Neutro Abs: 3.8 10*3/uL (ref 1.4–6.5)
Neutrophils Relative %: 63 %
Platelets: 227 10*3/uL (ref 150–440)
RBC: 3.61 MIL/uL — ABNORMAL LOW (ref 3.80–5.20)
RDW: 12.8 % (ref 11.5–14.5)
WBC: 6 10*3/uL (ref 3.6–11.0)

## 2017-09-23 MED ORDER — ZOLEDRONIC ACID 4 MG/5ML IV CONC
3.3000 mg | Freq: Once | INTRAVENOUS | Status: AC
  Administered 2017-09-23: 3.3 mg via INTRAVENOUS
  Filled 2017-09-23: qty 4.13

## 2017-09-23 MED ORDER — SODIUM CHLORIDE 0.9 % IV SOLN
Freq: Once | INTRAVENOUS | Status: AC
  Administered 2017-09-23: 16:00:00 via INTRAVENOUS
  Filled 2017-09-23: qty 1000

## 2017-09-23 NOTE — Progress Notes (Signed)
Patient here for follow up. No complaints today. 

## 2017-09-24 LAB — PROTEIN ELECTROPHORESIS, SERUM
A/G RATIO SPE: 1.4 (ref 0.7–1.7)
Albumin ELP: 3.4 g/dL (ref 2.9–4.4)
Alpha-1-Globulin: 0.3 g/dL (ref 0.0–0.4)
Alpha-2-Globulin: 0.7 g/dL (ref 0.4–1.0)
BETA GLOBULIN: 0.8 g/dL (ref 0.7–1.3)
GAMMA GLOBULIN: 0.6 g/dL (ref 0.4–1.8)
Globulin, Total: 2.4 g/dL (ref 2.2–3.9)
M-Spike, %: 0.2 g/dL — ABNORMAL HIGH
TOTAL PROTEIN ELP: 5.8 g/dL — AB (ref 6.0–8.5)

## 2017-09-24 LAB — IGG, IGA, IGM
IGG (IMMUNOGLOBIN G), SERUM: 528 mg/dL — AB (ref 700–1600)
IGM (IMMUNOGLOBULIN M), SRM: 19 mg/dL — AB (ref 26–217)
IgA: 33 mg/dL — ABNORMAL LOW (ref 64–422)

## 2017-09-24 LAB — KAPPA/LAMBDA LIGHT CHAINS
KAPPA, LAMDA LIGHT CHAIN RATIO: 0.01 — AB (ref 0.26–1.65)
Kappa free light chain: 13.4 mg/L (ref 3.3–19.4)
LAMDA FREE LIGHT CHAINS: 1882.9 mg/L — AB (ref 5.7–26.3)

## 2017-10-04 LAB — STOOL CULTURE: E COLI SHIGA TOXIN ASSAY: NEGATIVE

## 2017-10-04 LAB — OVA AND PARASITE EXAMINATION

## 2017-10-04 LAB — CLOSTRIDIUM DIFFICILE EIA: C DIFFICILE TOXINS A+ B, EIA: NEGATIVE

## 2017-10-09 DIAGNOSIS — A09 Infectious gastroenteritis and colitis, unspecified: Secondary | ICD-10-CM | POA: Insufficient documentation

## 2017-10-16 ENCOUNTER — Ambulatory Visit: Payer: Self-pay | Admitting: Internal Medicine

## 2017-10-21 ENCOUNTER — Inpatient Hospital Stay: Payer: Medicare Other

## 2017-10-21 ENCOUNTER — Inpatient Hospital Stay: Payer: Medicare Other | Attending: Oncology

## 2017-10-21 VITALS — BP 108/64 | HR 69 | Resp 20

## 2017-10-21 DIAGNOSIS — C9001 Multiple myeloma in remission: Secondary | ICD-10-CM

## 2017-10-21 DIAGNOSIS — C9000 Multiple myeloma not having achieved remission: Secondary | ICD-10-CM

## 2017-10-21 LAB — CBC WITH DIFFERENTIAL/PLATELET
Basophils Absolute: 0 10*3/uL (ref 0–0.1)
Basophils Relative: 1 %
EOS PCT: 1 %
Eosinophils Absolute: 0 10*3/uL (ref 0–0.7)
HCT: 35.6 % (ref 35.0–47.0)
HEMOGLOBIN: 12.4 g/dL (ref 12.0–16.0)
LYMPHS PCT: 23 %
Lymphs Abs: 1.4 10*3/uL (ref 1.0–3.6)
MCH: 33.4 pg (ref 26.0–34.0)
MCHC: 34.8 g/dL (ref 32.0–36.0)
MCV: 96.1 fL (ref 80.0–100.0)
MONOS PCT: 10 %
Monocytes Absolute: 0.6 10*3/uL (ref 0.2–0.9)
Neutro Abs: 4 10*3/uL (ref 1.4–6.5)
Neutrophils Relative %: 65 %
PLATELETS: 204 10*3/uL (ref 150–440)
RBC: 3.71 MIL/uL — AB (ref 3.80–5.20)
RDW: 13 % (ref 11.5–14.5)
WBC: 6.1 10*3/uL (ref 3.6–11.0)

## 2017-10-21 LAB — BASIC METABOLIC PANEL
Anion gap: 9 (ref 5–15)
BUN: 19 mg/dL (ref 6–20)
CHLORIDE: 108 mmol/L (ref 101–111)
CO2: 22 mmol/L (ref 22–32)
Calcium: 10.4 mg/dL — ABNORMAL HIGH (ref 8.9–10.3)
Creatinine, Ser: 0.9 mg/dL (ref 0.44–1.00)
GFR calc Af Amer: >60 mL/min (ref >60)
GFR calc non Af Amer: 58 mL/min — ABNORMAL LOW (ref >60)
GLUCOSE: 92 mg/dL (ref 65–99)
POTASSIUM: 3.7 mmol/L (ref 3.5–5.1)
SODIUM: 139 mmol/L (ref 135–145)

## 2017-10-21 MED ORDER — SODIUM CHLORIDE 0.9 % IV SOLN
INTRAVENOUS | Status: DC
  Administered 2017-10-21: 14:00:00 via INTRAVENOUS
  Filled 2017-10-21: qty 1000

## 2017-10-21 MED ORDER — ZOLEDRONIC ACID 4 MG/5ML IV CONC
3.3000 mg | Freq: Once | INTRAVENOUS | Status: AC
  Administered 2017-10-21: 3.3 mg via INTRAVENOUS
  Filled 2017-10-21: qty 4.13

## 2017-10-22 LAB — PROTEIN ELECTROPHORESIS, SERUM
A/G RATIO SPE: 1.6 (ref 0.7–1.7)
Albumin ELP: 3.8 g/dL (ref 2.9–4.4)
Alpha-1-Globulin: 0.2 g/dL (ref 0.0–0.4)
Alpha-2-Globulin: 0.7 g/dL (ref 0.4–1.0)
BETA GLOBULIN: 0.8 g/dL (ref 0.7–1.3)
Gamma Globulin: 0.6 g/dL (ref 0.4–1.8)
Globulin, Total: 2.4 g/dL (ref 2.2–3.9)
M-Spike, %: 0.3 g/dL — ABNORMAL HIGH
Total Protein ELP: 6.2 g/dL (ref 6.0–8.5)

## 2017-10-22 LAB — KAPPA/LAMBDA LIGHT CHAINS
KAPPA FREE LGHT CHN: 11.2 mg/L (ref 3.3–19.4)
Kappa, lambda light chain ratio: 0.01 — ABNORMAL LOW (ref 0.26–1.65)
LAMDA FREE LIGHT CHAINS: 2076 mg/L — AB (ref 5.7–26.3)

## 2017-10-22 LAB — IGG, IGA, IGM
IgA: 25 mg/dL — ABNORMAL LOW (ref 64–422)
IgG (Immunoglobin G), Serum: 514 mg/dL — ABNORMAL LOW (ref 700–1600)
IgM (Immunoglobulin M), Srm: 13 mg/dL — ABNORMAL LOW (ref 26–217)

## 2017-10-23 ENCOUNTER — Ambulatory Visit: Payer: Medicare Other | Admitting: Internal Medicine

## 2017-10-23 ENCOUNTER — Encounter: Payer: Self-pay | Admitting: Internal Medicine

## 2017-10-23 VITALS — BP 139/63 | HR 60 | Resp 16 | Ht 62.0 in | Wt 129.2 lb

## 2017-10-23 DIAGNOSIS — C9001 Multiple myeloma in remission: Secondary | ICD-10-CM | POA: Diagnosis not present

## 2017-10-23 DIAGNOSIS — J3089 Other allergic rhinitis: Secondary | ICD-10-CM

## 2017-10-23 DIAGNOSIS — Z1239 Encounter for other screening for malignant neoplasm of breast: Secondary | ICD-10-CM

## 2017-10-23 DIAGNOSIS — R5383 Other fatigue: Secondary | ICD-10-CM | POA: Diagnosis not present

## 2017-10-23 DIAGNOSIS — Z1231 Encounter for screening mammogram for malignant neoplasm of breast: Secondary | ICD-10-CM | POA: Diagnosis not present

## 2017-10-23 DIAGNOSIS — I10 Essential (primary) hypertension: Secondary | ICD-10-CM

## 2017-10-23 DIAGNOSIS — K219 Gastro-esophageal reflux disease without esophagitis: Secondary | ICD-10-CM

## 2017-10-23 DIAGNOSIS — E039 Hypothyroidism, unspecified: Secondary | ICD-10-CM

## 2017-10-23 MED ORDER — LEVOTHYROXINE SODIUM 50 MCG PO TABS: 50.0000 ug | ORAL_TABLET | Freq: Every day | ORAL | 3 refills | Status: DC

## 2017-10-23 NOTE — Progress Notes (Signed)
[facility] [place], [place] [address]  Internal MEDICINE  Office Visit Note  Patient Name: Rhonda [name]  [id number]  [id number]  Date of Service: 10/23/2017  Chief Complaint  Patient presents with  . Hypertension  . Hyperlipidemia  . Gastroesophageal Reflux  . Follow-up    lab results     HPI  Pt is here for routine follow up. Allergies are worse this time of the year. She was seen with acute diarrhea which resolved now, her stools were negative for any pathology. Pt is still feeling somewhat weak but better. Does have MM, seen by hematology. Lives by herslef in her home, daughter lives next door. Denies any memory problems    Current Medication: Outpatient Encounter Medications as of 10/23/2017  Medication Sig  . aspirin EC 81 MG tablet Take 81 mg by mouth daily.  . Cyanocobalamin (B-12 PO) Take 500 mcg by mouth daily.   . diphenoxylate-atropine (LOMOTIL) 2.5-0.025 MG tablet Take 1 tablet by mouth 4 (four) times daily as needed for diarrhea or loose stools.  Marland Kitchen EPINEPHrine 0.3 mg/0.3 mL IJ SOAJ injection   . fexofenadine (ALLEGRA) 30 MG tablet Take 30 mg by mouth daily.   . fluconazole (DIFLUCAN) 150 MG tablet Take 1 tablet (150 mg total) by mouth daily.  Marland Kitchen levothyroxine (SYNTHROID, LEVOTHROID) 50 MCG tablet Take 1 tablet (50 mcg total) by mouth daily before breakfast.  . Multiple Vitamins-Minerals (CENTRUM SILVER PO) Take 1 tablet by mouth daily.  . RESTASIS 0.05 % ophthalmic emulsion Place 1 drop into both eyes daily.   Marland Kitchen triamcinolone (NASACORT ALLERGY 24HR) 55 MCG/ACT AERO nasal inhaler Place 2 sprays into the nose 2 (two) times daily as needed.   Marland Kitchen UNABLE TO FIND Inject 1 Dose as directed once a week. Allergy injections once a week   . [DISCONTINUED] levothyroxine (SYNTHROID, LEVOTHROID) 50 MCG tablet Take 50 mcg by mouth daily before breakfast.   . [DISCONTINUED] sulfamethoxazole-trimethoprim (BACTRIM DS,SEPTRA DS) 800-160 MG tablet Take 1  tablet by mouth 2 (two) times daily.   No facility-administered encounter medications on file as of 10/23/2017.     Surgical History: Past Surgical History:  Procedure Laterality Date  . BREAST EXCISIONAL BIOPSY Right 15+ yrs ago   EXCISIONAL - NEG  . CATARACT EXTRACTION W/ INTRAOCULAR LENS IMPLANT    . CHOLECYSTECTOMY N/A 03/14/2017   Procedure: LAPAROSCOPIC CHOLECYSTECTOMY;  Surgeon: Florene Glen, MD;  Location: [facility];  Service: General;  Laterality: N/A;  . COLONOSCOPY WITH PROPOFOL N/A 02/21/2015   Procedure: COLONOSCOPY WITH PROPOFOL;  Surgeon: Lucilla Lame, MD;  Location: [place];  Service: Endoscopy;  Laterality: N/A;  . [place]  . POLYPECTOMY  02/21/2015   Procedure: POLYPECTOMY;  Surgeon: Lucilla Lame, MD;  Location: [place];  Service: Endoscopy;;    Medical History: Past Medical History:  Diagnosis Date  . Arthritis    hands  . Benign neoplasm of ascending colon   . Cataract   . GERD (gastroesophageal reflux disease)   . Hyperlipemia   . Hypertension 01/31/2015  . Hypothyroidism   . Multiple myeloma ([place]) 04/18/2016  . Multiple myeloma in remission ([place]) 04/18/2016  . Osteoporosis   . Skin cancer    basal cell    Family History: Family History  Problem Relation Age of Onset  . Heart disease Mother   . Stroke Father   . Heart disease Brother   . Kidney cancer Neg Hx   . Prostate cancer  Neg Hx   . Bladder Cancer Neg Hx   . Breast cancer Neg Hx     Social History   Socioeconomic History  . Marital status: Widowed    Spouse name: Not on file  . Number of children: Not on file  . Years of education: Not on file  . Highest education level: Not on file  Occupational History  . Not on file  Social Needs  . Financial resource strain: Not on file  . Food insecurity:    Worry: Not on file    Inability: Not on file  . Transportation needs:    Medical: Not on file    Non-medical: Not on file  Tobacco Use   . Smoking status: Never Smoker  . Smokeless tobacco: Never Used  Substance and Sexual Activity  . Alcohol use: No    Alcohol/week: 0.0 oz  . Drug use: No  . Sexual activity: Not on file  Lifestyle  . Physical activity:    Days per week: Not on file    Minutes per session: Not on file  . Stress: Not on file  Relationships  . Social connections:    Talks on phone: Not on file    Gets together: Not on file    Attends religious service: Not on file    Active member of club or organization: Not on file    Attends meetings of clubs or organizations: Not on file    Relationship status: Not on file  . Intimate partner violence:    Fear of current or ex partner: Not on file    Emotionally abused: Not on file    Physically abused: Not on file    Forced sexual activity: Not on file  Other Topics Concern  . Not on file  Social History Narrative  . Not on file   Review of Systems  Constitutional: Negative for chills, diaphoresis and fatigue.  HENT: Negative for ear pain, postnasal drip and sinus pressure.   Eyes: Negative for photophobia, discharge, redness, itching and visual disturbance.  Respiratory: Negative for cough, shortness of breath and wheezing.   Cardiovascular: Negative for chest pain, palpitations and leg swelling.  Gastrointestinal: Negative for abdominal pain, constipation, diarrhea, nausea and vomiting.  Genitourinary: Negative for dysuria and flank pain.  Musculoskeletal: Negative for arthralgias, back pain, gait problem and neck pain.  Skin: Negative for color change.  Allergic/Immunologic: Negative for environmental allergies and food allergies.  Neurological: Negative for dizziness and headaches.  Hematological: Does not bruise/bleed easily.  Psychiatric/Behavioral: Negative for agitation, behavioral problems (depression) and hallucinations.   Vital Signs: BP 139/63   Pulse 60   Resp 16   Ht '5\' 2"'  (1.575 m)   Wt 129 lb 3.2 oz (58.6 kg)   SpO2 97%   BMI  23.63 kg/m   Physical Exam  Constitutional: She is oriented to person, place, and time. She appears well-developed and well-nourished. No distress.  HENT:  Head: Normocephalic and atraumatic.  Mouth/Throat: Oropharynx is clear and moist. No oropharyngeal exudate.  Eyes: Pupils are equal, round, and reactive to light. EOM are normal.  Neck: Normal range of motion. Neck supple. No JVD present. No tracheal deviation present. No thyromegaly present.  Cardiovascular: Normal rate, regular rhythm and normal heart sounds. Exam reveals no gallop and no friction rub.  No murmur heard. Pulmonary/Chest: Effort normal. No respiratory distress. She has no wheezes. She has no rales. She exhibits no tenderness.  Abdominal: Soft. Bowel sounds are normal.  Musculoskeletal:  Normal range of motion.  Lymphadenopathy:    She has no cervical adenopathy.  Neurological: She is alert and oriented to person, place, and time. No cranial nerve deficit.  Skin: Skin is warm and dry. She is not diaphoretic.  Psychiatric: She has a normal mood and affect. Her behavior is normal. Judgment and thought content normal.   Assessment/Plan: 1. Seasonal allergic rhinitis due to other allergic trigger - Add Flonase otc, continue allegra as before   2. Acquired hypothyroidism - Continue Synthroid   3. Multiple myeloma in remission [facility]) - Per Hematology  4. Essential hypertension - Controlled   5. Gastroesophageal reflux disease without esophagitis - Stable   6. Fatigue, unspecified type - Iron studies   7. Breast screening - MM DIGITAL SCREENING BILATERAL; Future  General Counseling: Tashi verbalizes understanding of the findings of todays visit and agrees with plan of treatment. I have discussed any further diagnostic evaluation that may be needed or ordered today. We also reviewed her medications today. she has been encouraged to call the office with any questions or concerns that should arise related to todays  visit.    Orders Placed This Encounter  Procedures  . MM DIGITAL SCREENING BILATERAL  . Fe+TIBC+Fer    Meds ordered this encounter  Medications  . levothyroxine (SYNTHROID, LEVOTHROID) 50 MCG tablet    Sig: Take 1 tablet (50 mcg total) by mouth daily before breakfast.    Dispense:  90 tablet    Refill:  3    Time spent:20 Minutes   Dr Lavera Guise Internal medicine

## 2017-11-16 NOTE — Progress Notes (Signed)
[place]  Telephone:(336) [phone] Fax:(336) [phone]  ID: Rhonda [name] OB: Dec 12, 1933  MR#: [id number]  LKG#:[id number]  Patient Care Team: Lavera Guise, MD as PCP - General (Internal Medicine)  CHIEF COMPLAINT:  Multiple myeloma in remission.  INTERVAL HISTORY: Patient returns to clinic today for repeat laboratory work, further evaluation, and continuation of Zometa.  She continues to feel well and remains asymptomatic.  She admits to mild, residual numbness in her fingertips that does not affect her day-to-day activity.  She has no other neurologic complaints. She denies any recent fevers or illnesses. She has a good appetite and denies weight loss. She denies any chest [name] or shortness of breath. She denies any nausea, vomiting, constipation, or diarrhea.  She has no hematuria, melena, or hematochezia. She has no urinary complaints.  Patient offers no further specific complaints today.  REVIEW OF SYSTEMS:   Review of Systems  Constitutional: Negative.  Negative for fever, malaise/fatigue and weight loss.  HENT: Negative for congestion and sore throat.   Respiratory: Negative.  Negative for cough and shortness of breath.   Cardiovascular: Negative.  Negative for chest [name], palpitations and leg swelling.  Gastrointestinal: Negative.  Negative for abdominal [name], blood in stool, constipation, diarrhea, melena, nausea and vomiting.  Genitourinary: Negative.  Negative for dysuria.  Musculoskeletal: Negative.  Negative for back [name].  Skin: Negative.  Negative for rash.  Neurological: Positive for sensory change. Negative for tingling, focal weakness and weakness.  Psychiatric/Behavioral: Negative.  The patient is not nervous/anxious and does not have insomnia.     As per HPI. Otherwise, a complete review of systems is negative.  PAST MEDICAL HISTORY: Past Medical History:  Diagnosis Date  . Arthritis    hands  . Benign neoplasm of ascending colon   .  Cataract   . GERD (gastroesophageal reflux disease)   . Hyperlipemia   . Hypertension 01/31/2015  . Hypothyroidism   . Multiple myeloma ([place]) 04/18/2016  . Multiple myeloma in remission ([place]) 04/18/2016  . Osteoporosis   . Skin cancer    basal cell    PAST SURGICAL HISTORY: Past Surgical History:  Procedure Laterality Date  . BREAST EXCISIONAL BIOPSY Right 15+ yrs ago   EXCISIONAL - NEG  . CATARACT EXTRACTION W/ INTRAOCULAR LENS IMPLANT    . CHOLECYSTECTOMY N/A 03/14/2017   Procedure: LAPAROSCOPIC CHOLECYSTECTOMY;  Surgeon: Florene Glen, MD;  Location: [facility];  Service: General;  Laterality: N/A;  . COLONOSCOPY WITH PROPOFOL N/A 02/21/2015   Procedure: COLONOSCOPY WITH PROPOFOL;  Surgeon: Lucilla Lame, MD;  Location: [place];  Service: Endoscopy;  Laterality: N/A;  . [place]  . POLYPECTOMY  02/21/2015   Procedure: POLYPECTOMY;  Surgeon: Lucilla Lame, MD;  Location: [place];  Service: Endoscopy;;    FAMILY HISTORY: Family History  Problem Relation Age of Onset  . Heart disease Mother   . Stroke Father   . Heart disease Brother   . Kidney cancer Neg Hx   . Prostate cancer Neg Hx   . Bladder Cancer Neg Hx   . Breast cancer Neg Hx     ADVANCED DIRECTIVES (Y/N):  N  HEALTH MAINTENANCE: Social History   Tobacco Use  . Smoking status: Never Smoker  . Smokeless tobacco: Never Used  Substance Use Topics  . Alcohol use: No    Alcohol/week: 0.0 oz  . Drug use: No     Colonoscopy:  PAP:  Bone density:  Lipid panel:  No  Known Allergies  Current Outpatient Medications  Medication Sig Dispense Refill  . aspirin EC 81 MG tablet Take 81 mg by mouth daily.    . Cyanocobalamin (B-12 PO) Take 500 mcg by mouth daily.     . diphenoxylate-atropine (LOMOTIL) 2.5-0.025 MG tablet Take 1 tablet by mouth 4 (four) times daily as needed for diarrhea or loose stools. 20 tablet 0  . EPINEPHrine 0.3 mg/0.3 mL IJ SOAJ injection     .  fexofenadine (ALLEGRA) 30 MG tablet Take 30 mg by mouth daily.     . fluconazole (DIFLUCAN) 150 MG tablet Take 1 tablet (150 mg total) by mouth daily. 3 tablet 0  . levothyroxine (SYNTHROID, LEVOTHROID) 50 MCG tablet Take 1 tablet (50 mcg total) by mouth daily before breakfast. 90 tablet 3  . Multiple Vitamins-Minerals (CENTRUM SILVER PO) Take 1 tablet by mouth daily.    . RESTASIS 0.05 % ophthalmic emulsion Place 1 drop into both eyes daily.     Marland Kitchen triamcinolone (NASACORT ALLERGY 24HR) 55 MCG/ACT AERO nasal inhaler Place 2 sprays into the nose 2 (two) times daily as needed.     Marland Kitchen UNABLE TO FIND Inject 1 Dose as directed once a week. Allergy injections once a week      No current facility-administered medications for this visit.     OBJECTIVE: Vitals:   11/18/17 1116 11/18/17 1119  BP:  124/71  Pulse:  76  Resp: 12   Temp:  (!) 97.1 F (36.2 C)     Body mass index is 23.63 kg/m.    ECOG FS:0 - Asymptomatic  General: Well-developed, well-nourished, no acute distress. Eyes: Pink conjunctiva, anicteric sclera. Lungs: Clear to auscultation bilaterally. Heart: Regular rate and rhythm. No rubs, murmurs, or gallops. Abdomen: Soft, nontender, nondistended. No organomegaly noted, normoactive bowel sounds. Musculoskeletal: No edema, cyanosis, or clubbing. Neuro: Alert, answering all questions appropriately. Cranial nerves grossly intact. Skin: No rashes or petechiae noted. Psych: Normal affect.  LAB RESULTS:  Lab Results  Component Value Date   NA 138 11/18/2017   K 4.2 11/18/2017   CL 109 11/18/2017   CO2 23 11/18/2017   GLUCOSE 94 11/18/2017   BUN 18 11/18/2017   CREATININE 1.10 (H) 11/18/2017   CALCIUM 10.8 (H) 11/18/2017   PROT 6.5 03/04/2017   ALBUMIN 4.2 03/04/2017   AST 24 03/04/2017   ALT 16 03/04/2017   ALKPHOS 49 03/04/2017   BILITOT 0.5 03/04/2017   GFRNONAA 45 (L) 11/18/2017   GFRAA 53 (L) 11/18/2017    Lab Results  Component Value Date   WBC 5.9 11/18/2017    NEUTROABS 3.9 11/18/2017   HGB 12.9 11/18/2017   HCT 37.2 11/18/2017   MCV 96.3 11/18/2017   PLT 194 11/18/2017   Lab Results  Component Value Date   TOTALPROTELP 6.2 10/21/2017   ALBUMINELP 3.8 10/21/2017   A1GS 0.2 10/21/2017   A2GS 0.7 10/21/2017   BETS 0.8 10/21/2017   GAMS 0.6 10/21/2017   MSPIKE 0.3 (H) 10/21/2017   SPEI Comment 10/21/2017     STUDIES: No results found.  ASSESSMENT:  Multiple myeloma in remission.  PLAN:    1.  Multiple myeloma in remission: Patient's calcium levels are now within normal limits. Patient's M spike appears to fluctuate between 0.0 and 0.2.  Her most recent result on Oct 21, 2017 was 0.3. Her lambda free light chains are elevated and have trended up slightly over the past several months.  IgG, IgM, and IgA are all mildly decreased.  Previously, bone marrow biopsy had 44% plasma cells with normal cytogenetics. Given the results of her metastatic bone survey, her elevated lambda free chains, and her bone marrow biopsy, patient fit the criteria for multiple myeloma. Because of patient's peripheral neuropathy, Revlimid was discontinued and patient did not receive maintenance treatment. Her neuropathy is minimal, so can reuse Revlimid in the future if necessary. Patient initiated monthly Zometa on May 07, 2016. Continue 3.3 mg Zometa every 4 weeks.  Return to clinic in 4 weeks for laboratory work and Zometa only and then in 8 weeks for further evaluation. 2. Hypercalcemia: Patient's calcium is 10.8 today.  Proceed with Zometa as above.    Approximately 30 minutes was spent in discussion of which greater than 50% was consultation.   Patient expressed understanding and was in agreement with this plan. She also understands that She can call clinic at any time with any questions, concerns, or complaints.    Lloyd Huger, MD 11/19/17 10:22 AM

## 2017-11-18 ENCOUNTER — Inpatient Hospital Stay: Payer: Medicare Other

## 2017-11-18 ENCOUNTER — Other Ambulatory Visit: Payer: Self-pay

## 2017-11-18 ENCOUNTER — Inpatient Hospital Stay (HOSPITAL_BASED_OUTPATIENT_CLINIC_OR_DEPARTMENT_OTHER): Payer: Medicare Other | Admitting: Oncology

## 2017-11-18 ENCOUNTER — Encounter: Payer: Self-pay | Admitting: Oncology

## 2017-11-18 ENCOUNTER — Inpatient Hospital Stay: Payer: Medicare Other | Attending: Oncology

## 2017-11-18 VITALS — BP 124/71 | HR 76 | Temp 97.1°F | Resp 12 | Ht 62.0 in | Wt 129.2 lb

## 2017-11-18 DIAGNOSIS — C9001 Multiple myeloma in remission: Secondary | ICD-10-CM | POA: Insufficient documentation

## 2017-11-18 DIAGNOSIS — E039 Hypothyroidism, unspecified: Secondary | ICD-10-CM

## 2017-11-18 DIAGNOSIS — I10 Essential (primary) hypertension: Secondary | ICD-10-CM

## 2017-11-18 DIAGNOSIS — C9000 Multiple myeloma not having achieved remission: Secondary | ICD-10-CM

## 2017-11-18 LAB — CBC WITH DIFFERENTIAL/PLATELET
BASOS PCT: 1 %
Basophils Absolute: 0 10*3/uL (ref 0–0.1)
Eosinophils Absolute: 0 10*3/uL (ref 0–0.7)
Eosinophils Relative: 1 %
HEMATOCRIT: 37.2 % (ref 35.0–47.0)
HEMOGLOBIN: 12.9 g/dL (ref 12.0–16.0)
LYMPHS ABS: 1.2 10*3/uL (ref 1.0–3.6)
LYMPHS PCT: 20 %
MCH: 33.5 pg (ref 26.0–34.0)
MCHC: 34.8 g/dL (ref 32.0–36.0)
MCV: 96.3 fL (ref 80.0–100.0)
MONOS PCT: 12 %
Monocytes Absolute: 0.7 10*3/uL (ref 0.2–0.9)
NEUTROS ABS: 3.9 10*3/uL (ref 1.4–6.5)
NEUTROS PCT: 66 %
Platelets: 194 10*3/uL (ref 150–440)
RBC: 3.86 MIL/uL (ref 3.80–5.20)
RDW: 12.9 % (ref 11.5–14.5)
WBC: 5.9 10*3/uL (ref 3.6–11.0)

## 2017-11-18 LAB — BASIC METABOLIC PANEL
ANION GAP: 6 (ref 5–15)
BUN: 18 mg/dL (ref 6–20)
CHLORIDE: 109 mmol/L (ref 101–111)
CO2: 23 mmol/L (ref 22–32)
Calcium: 10.8 mg/dL — ABNORMAL HIGH (ref 8.9–10.3)
Creatinine, Ser: 1.1 mg/dL — ABNORMAL HIGH (ref 0.44–1.00)
GFR calc non Af Amer: 45 mL/min — ABNORMAL LOW (ref >60)
GFR, EST AFRICAN AMERICAN: 53 mL/min — AB (ref >60)
Glucose, Bld: 94 mg/dL (ref 65–99)
POTASSIUM: 4.2 mmol/L (ref 3.5–5.1)
Sodium: 138 mmol/L (ref 135–145)

## 2017-11-18 MED ORDER — ZOLEDRONIC ACID 4 MG/5ML IV CONC
3.3000 mg | Freq: Once | INTRAVENOUS | Status: AC
  Administered 2017-11-18: 3.3 mg via INTRAVENOUS
  Filled 2017-11-18: qty 4.13

## 2017-11-18 NOTE — Progress Notes (Signed)
Patient here for follow up. NO changes since her last appt.

## 2017-11-19 LAB — KAPPA/LAMBDA LIGHT CHAINS
KAPPA FREE LGHT CHN: 11.1 mg/L (ref 3.3–19.4)
KAPPA, LAMDA LIGHT CHAIN RATIO: 0 — AB (ref 0.26–1.65)
LAMDA FREE LIGHT CHAINS: 2441.4 mg/L — AB (ref 5.7–26.3)

## 2017-11-19 LAB — IGG, IGA, IGM
IGA: 26 mg/dL — AB (ref 64–422)
IGG (IMMUNOGLOBIN G), SERUM: 542 mg/dL — AB (ref 700–1600)
IGM (IMMUNOGLOBULIN M), SRM: 12 mg/dL — AB (ref 26–217)

## 2017-11-20 LAB — PROTEIN ELECTROPHORESIS, SERUM
A/G RATIO SPE: 1.8 — AB (ref 0.7–1.7)
Albumin ELP: 4 g/dL (ref 2.9–4.4)
Alpha-1-Globulin: 0.2 g/dL (ref 0.0–0.4)
Alpha-2-Globulin: 0.6 g/dL (ref 0.4–1.0)
Beta Globulin: 0.8 g/dL (ref 0.7–1.3)
Gamma Globulin: 0.6 g/dL (ref 0.4–1.8)
Globulin, Total: 2.2 g/dL (ref 2.2–3.9)
M-Spike, %: 0.2 g/dL — ABNORMAL HIGH
TOTAL PROTEIN ELP: 6.2 g/dL (ref 6.0–8.5)

## 2017-12-18 ENCOUNTER — Inpatient Hospital Stay: Payer: Medicare Other

## 2017-12-18 ENCOUNTER — Inpatient Hospital Stay: Payer: Medicare Other | Attending: Oncology

## 2017-12-18 ENCOUNTER — Other Ambulatory Visit: Payer: Self-pay

## 2017-12-18 DIAGNOSIS — C9001 Multiple myeloma in remission: Secondary | ICD-10-CM | POA: Diagnosis present

## 2017-12-18 DIAGNOSIS — C9000 Multiple myeloma not having achieved remission: Secondary | ICD-10-CM

## 2017-12-18 LAB — BASIC METABOLIC PANEL
ANION GAP: 7 (ref 5–15)
BUN: 16 mg/dL (ref 8–23)
CO2: 22 mmol/L (ref 22–32)
Calcium: 11.7 mg/dL — ABNORMAL HIGH (ref 8.9–10.3)
Chloride: 107 mmol/L (ref 98–111)
Creatinine, Ser: 0.99 mg/dL (ref 0.44–1.00)
GFR calc Af Amer: 60 mL/min — ABNORMAL LOW (ref >60)
GFR, EST NON AFRICAN AMERICAN: 52 mL/min — AB (ref >60)
Glucose, Bld: 128 mg/dL — ABNORMAL HIGH (ref 70–99)
POTASSIUM: 3.6 mmol/L (ref 3.5–5.1)
SODIUM: 136 mmol/L (ref 135–145)

## 2017-12-18 LAB — CBC WITH DIFFERENTIAL/PLATELET
Basophils Absolute: 0.1 10*3/uL (ref 0–0.1)
Basophils Relative: 1 %
EOS ABS: 0 10*3/uL (ref 0–0.7)
EOS PCT: 0 %
HCT: 35.2 % (ref 35.0–47.0)
Hemoglobin: 12.3 g/dL (ref 12.0–16.0)
LYMPHS PCT: 14 %
Lymphs Abs: 1.3 10*3/uL (ref 1.0–3.6)
MCH: 33.7 pg (ref 26.0–34.0)
MCHC: 34.8 g/dL (ref 32.0–36.0)
MCV: 96.8 fL (ref 80.0–100.0)
Monocytes Absolute: 0.9 10*3/uL (ref 0.2–0.9)
Monocytes Relative: 9 %
Neutro Abs: 7.4 10*3/uL — ABNORMAL HIGH (ref 1.4–6.5)
Neutrophils Relative %: 76 %
PLATELETS: 224 10*3/uL (ref 150–440)
RBC: 3.64 MIL/uL — AB (ref 3.80–5.20)
RDW: 12.8 % (ref 11.5–14.5)
WBC: 9.8 10*3/uL (ref 3.6–11.0)

## 2017-12-18 MED ORDER — ZOLEDRONIC ACID 4 MG/5ML IV CONC
3.3000 mg | Freq: Once | INTRAVENOUS | Status: AC
  Administered 2017-12-18: 3.3 mg via INTRAVENOUS
  Filled 2017-12-18: qty 4.13

## 2017-12-18 MED ORDER — SODIUM CHLORIDE 0.9 % IV SOLN
INTRAVENOUS | Status: DC
  Administered 2017-12-18: 14:00:00 via INTRAVENOUS
  Filled 2017-12-18: qty 1000

## 2017-12-19 LAB — KAPPA/LAMBDA LIGHT CHAINS
KAPPA FREE LGHT CHN: 11 mg/L (ref 3.3–19.4)
KAPPA, LAMDA LIGHT CHAIN RATIO: 0 — AB (ref 0.26–1.65)
LAMDA FREE LIGHT CHAINS: 2325.2 mg/L — AB (ref 5.7–26.3)

## 2017-12-19 LAB — PROTEIN ELECTROPHORESIS, SERUM
A/G RATIO SPE: 1.6 (ref 0.7–1.7)
ALPHA-2-GLOBULIN: 0.7 g/dL (ref 0.4–1.0)
Albumin ELP: 3.9 g/dL (ref 2.9–4.4)
Alpha-1-Globulin: 0.2 g/dL (ref 0.0–0.4)
Beta Globulin: 0.9 g/dL (ref 0.7–1.3)
GLOBULIN, TOTAL: 2.4 g/dL (ref 2.2–3.9)
Gamma Globulin: 0.6 g/dL (ref 0.4–1.8)
M-Spike, %: 0.3 g/dL — ABNORMAL HIGH
Total Protein ELP: 6.3 g/dL (ref 6.0–8.5)

## 2017-12-19 LAB — IGG, IGA, IGM
IGA: 31 mg/dL — AB (ref 64–422)
IGG (IMMUNOGLOBIN G), SERUM: 556 mg/dL — AB (ref 700–1600)
IGM (IMMUNOGLOBULIN M), SRM: 13 mg/dL — AB (ref 26–217)

## 2018-01-19 NOTE — Progress Notes (Signed)
[place]  Telephone:(336) [phone] Fax:(336) [phone]  ID: Rhonda [name] OB: 15-Mar-1934  MR#: [id number]  AUQ#:[id number]  Patient Care Team: Lavera Guise, MD as PCP - General (Internal Medicine)  CHIEF COMPLAINT:  Multiple myeloma in remission.  INTERVAL HISTORY: Patient returns to clinic today for repeat laboratory work, further evaluation, and continuation of Zometa.  She continues to feel well and remains asymptomatic.  She has a mild peripheral neuropathy that does not affect her day-to-day activity. She has no other neurologic complaints. She denies any recent fevers or illnesses. She has a good appetite and denies weight loss. She denies any chest [name] or shortness of breath. She denies any nausea, vomiting, constipation, or diarrhea.  She has no hematuria, melena, or hematochezia. She has no urinary complaints.  Patient feels at her baseline offers no specific complaints today.  REVIEW OF SYSTEMS:   Review of Systems  Constitutional: Negative.  Negative for fever, malaise/fatigue and weight loss.  HENT: Negative for congestion and sore throat.   Respiratory: Negative.  Negative for cough and shortness of breath.   Cardiovascular: Negative.  Negative for chest [name], palpitations and leg swelling.  Gastrointestinal: Negative.  Negative for abdominal [name], blood in stool, constipation, diarrhea, melena, nausea and vomiting.  Genitourinary: Negative.  Negative for dysuria.  Musculoskeletal: Negative.  Negative for back [name].  Skin: Negative.  Negative for rash.  Neurological: Positive for sensory change. Negative for tingling, focal weakness and weakness.  Psychiatric/Behavioral: Negative.  The patient is not nervous/anxious and does not have insomnia.     As per HPI. Otherwise, a complete review of systems is negative.  PAST MEDICAL HISTORY: Past Medical History:  Diagnosis Date  . Arthritis    hands  . Benign neoplasm of ascending colon   . Cataract     . GERD (gastroesophageal reflux disease)   . Hyperlipemia   . Hypertension 01/31/2015  . Hypothyroidism   . Multiple myeloma ([place]) 04/18/2016  . Multiple myeloma in remission ([place]) 04/18/2016  . Osteoporosis   . Skin cancer    basal cell    PAST SURGICAL HISTORY: Past Surgical History:  Procedure Laterality Date  . BREAST EXCISIONAL BIOPSY Right 15+ yrs ago   EXCISIONAL - NEG  . CATARACT EXTRACTION W/ INTRAOCULAR LENS IMPLANT    . CHOLECYSTECTOMY N/A 03/14/2017   Procedure: LAPAROSCOPIC CHOLECYSTECTOMY;  Surgeon: Florene Glen, MD;  Location: [facility];  Service: General;  Laterality: N/A;  . COLONOSCOPY WITH PROPOFOL N/A 02/21/2015   Procedure: COLONOSCOPY WITH PROPOFOL;  Surgeon: Lucilla Lame, MD;  Location: [place];  Service: Endoscopy;  Laterality: N/A;  . [place]  . POLYPECTOMY  02/21/2015   Procedure: POLYPECTOMY;  Surgeon: Lucilla Lame, MD;  Location: [place];  Service: Endoscopy;;    FAMILY HISTORY: Family History  Problem Relation Age of Onset  . Heart disease Mother   . Stroke Father   . Heart disease Brother   . Kidney cancer Neg Hx   . Prostate cancer Neg Hx   . Bladder Cancer Neg Hx   . Breast cancer Neg Hx     ADVANCED DIRECTIVES (Y/N):  N  HEALTH MAINTENANCE: Social History   Tobacco Use  . Smoking status: Never Smoker  . Smokeless tobacco: Never Used  Substance Use Topics  . Alcohol use: No    Alcohol/week: 0.0 standard drinks  . Drug use: No     Colonoscopy:  PAP:  Bone density:  Lipid panel:  No Known Allergies  Current Outpatient Medications  Medication Sig Dispense Refill  . aspirin EC 81 MG tablet Take 81 mg by mouth daily.    . Cyanocobalamin (B-12 PO) Take 500 mcg by mouth daily.     . diphenoxylate-atropine (LOMOTIL) 2.5-0.025 MG tablet Take 1 tablet by mouth 4 (four) times daily as needed for diarrhea or loose stools. 20 tablet 0  . EPINEPHrine 0.3 mg/0.3 mL IJ SOAJ injection     .  fexofenadine (ALLEGRA) 30 MG tablet Take 30 mg by mouth daily.     . fluconazole (DIFLUCAN) 150 MG tablet Take 1 tablet (150 mg total) by mouth daily. 3 tablet 0  . levothyroxine (SYNTHROID, LEVOTHROID) 50 MCG tablet Take 1 tablet (50 mcg total) by mouth daily before breakfast. 90 tablet 3  . Multiple Vitamins-Minerals (CENTRUM SILVER PO) Take 1 tablet by mouth daily.    . RESTASIS 0.05 % ophthalmic emulsion Place 1 drop into both eyes daily.     Marland Kitchen triamcinolone (NASACORT ALLERGY 24HR) 55 MCG/ACT AERO nasal inhaler Place 2 sprays into the nose 2 (two) times daily as needed.     Marland Kitchen UNABLE TO FIND Inject 1 Dose as directed once a week. Allergy injections once a week      No current facility-administered medications for this visit.     OBJECTIVE: Vitals:   01/20/18 1022  BP: 127/75  Pulse: 72  Resp: 20  Temp: (!) 97.5 F (36.4 C)     Body mass index is 23.12 kg/m.    ECOG FS:0 - Asymptomatic  General: Well-developed, well-nourished, no acute distress. Eyes: Pink conjunctiva, anicteric sclera. HEENT: Normocephalic, moist mucous membranes. Lungs: Clear to auscultation bilaterally. Heart: Regular rate and rhythm. No rubs, murmurs, or gallops. Abdomen: Soft, nontender, nondistended. No organomegaly noted, normoactive bowel sounds. Musculoskeletal: No edema, cyanosis, or clubbing. Neuro: Alert, answering all questions appropriately. Cranial nerves grossly intact. Skin: No rashes or petechiae noted. Psych: Normal affect.  LAB RESULTS:  Lab Results  Component Value Date   NA 137 01/20/2018   K 3.5 01/20/2018   CL 107 01/20/2018   CO2 21 (L) 01/20/2018   GLUCOSE 106 (H) 01/20/2018   BUN 15 01/20/2018   CREATININE 0.86 01/20/2018   CALCIUM 11.2 (H) 01/20/2018   PROT 6.5 03/04/2017   ALBUMIN 4.2 03/04/2017   AST 24 03/04/2017   ALT 16 03/04/2017   ALKPHOS 49 03/04/2017   BILITOT 0.5 03/04/2017   GFRNONAA >60 01/20/2018   GFRAA >60 01/20/2018    Lab Results  Component Value  Date   WBC 5.7 01/20/2018   NEUTROABS 3.6 01/20/2018   HGB 12.1 01/20/2018   HCT 34.9 (L) 01/20/2018   MCV 97.2 01/20/2018   PLT 172 01/20/2018   Lab Results  Component Value Date   TOTALPROTELP 6.4 01/20/2018   ALBUMINELP 3.9 01/20/2018   A1GS 0.2 01/20/2018   A2GS 0.8 01/20/2018   BETS 0.9 01/20/2018   GAMS 0.6 01/20/2018   MSPIKE 0.2 (H) 01/20/2018   SPEI Comment 01/20/2018     STUDIES: No results found.  ASSESSMENT:  Multiple myeloma in remission.  PLAN:    1.  Multiple myeloma in remission: Patient's calcium levels are now within normal limits. Patient's M spike appears to fluctuate between 0.0 and 0.2.  Today's result is 0.2. Her lambda free light chains remain persistently elevated fluctuating between 2000 and 2400. IgG, IgM, and IgA are all mildly decreased.  Previously, bone marrow biopsy had 44% plasma cells with normal cytogenetics.  Given the results of her metastatic bone survey, her elevated lambda free chains, and her bone marrow biopsy, patient fit the criteria for multiple myeloma. Because of patient's peripheral neuropathy, Revlimid was discontinued and patient did not receive maintenance treatment. Her neuropathy is minimal, so can reuse Revlimid in the future if necessary. Patient initiated monthly Zometa on May 07, 2016.  Proceed with 3.3 mg Zometa today.  Return to clinic in 4 and 8 weeks for laboratory work and Zometa only and then in 12 weeks for further evaluation.  At that point patient will have received 2 years of monthly Zometa and can be transitioned to treatment every 3 months.  2. Hypercalcemia: Calcium mildly elevated at 11.2 today.  Proceed with Zometa as above.    I spent a total of 30 minutes face-to-face with the patient of which greater than 50% of the visit was spent in counseling and coordination of care as detailed above.   Patient expressed understanding and was in agreement with this plan. She also understands that She can call clinic  at any time with any questions, concerns, or complaints.    Lloyd Huger, MD 01/22/18 1:21 PM

## 2018-01-20 ENCOUNTER — Inpatient Hospital Stay (HOSPITAL_BASED_OUTPATIENT_CLINIC_OR_DEPARTMENT_OTHER): Payer: Medicare Other | Admitting: Oncology

## 2018-01-20 ENCOUNTER — Encounter: Payer: Self-pay | Admitting: Oncology

## 2018-01-20 ENCOUNTER — Inpatient Hospital Stay: Payer: Medicare Other

## 2018-01-20 ENCOUNTER — Inpatient Hospital Stay: Payer: Medicare Other | Attending: Oncology

## 2018-01-20 VITALS — BP 127/75 | HR 72 | Temp 97.5°F | Resp 20 | Wt 126.4 lb

## 2018-01-20 VITALS — BP 112/65 | HR 72 | Resp 20

## 2018-01-20 DIAGNOSIS — C9001 Multiple myeloma in remission: Secondary | ICD-10-CM

## 2018-01-20 DIAGNOSIS — Z7982 Long term (current) use of aspirin: Secondary | ICD-10-CM

## 2018-01-20 DIAGNOSIS — C9000 Multiple myeloma not having achieved remission: Secondary | ICD-10-CM

## 2018-01-20 DIAGNOSIS — E039 Hypothyroidism, unspecified: Secondary | ICD-10-CM | POA: Diagnosis not present

## 2018-01-20 DIAGNOSIS — I10 Essential (primary) hypertension: Secondary | ICD-10-CM | POA: Insufficient documentation

## 2018-01-20 DIAGNOSIS — G629 Polyneuropathy, unspecified: Secondary | ICD-10-CM

## 2018-01-20 LAB — BASIC METABOLIC PANEL
Anion gap: 9 (ref 5–15)
BUN: 15 mg/dL (ref 8–23)
CHLORIDE: 107 mmol/L (ref 98–111)
CO2: 21 mmol/L — ABNORMAL LOW (ref 22–32)
CREATININE: 0.86 mg/dL (ref 0.44–1.00)
Calcium: 11.2 mg/dL — ABNORMAL HIGH (ref 8.9–10.3)
GFR calc non Af Amer: >60 mL/min (ref >60)
Glucose, Bld: 106 mg/dL — ABNORMAL HIGH (ref 70–99)
Potassium: 3.5 mmol/L (ref 3.5–5.1)
SODIUM: 137 mmol/L (ref 135–145)

## 2018-01-20 LAB — CBC WITH DIFFERENTIAL/PLATELET
BASOS ABS: 0 10*3/uL (ref 0–0.1)
BASOS PCT: 0 %
EOS ABS: 0.1 10*3/uL (ref 0–0.7)
Eosinophils Relative: 1 %
HCT: 34.9 % — ABNORMAL LOW (ref 35.0–47.0)
HEMOGLOBIN: 12.1 g/dL (ref 12.0–16.0)
Lymphocytes Relative: 24 %
Lymphs Abs: 1.4 10*3/uL (ref 1.0–3.6)
MCH: 33.5 pg (ref 26.0–34.0)
MCHC: 34.5 g/dL (ref 32.0–36.0)
MCV: 97.2 fL (ref 80.0–100.0)
Monocytes Absolute: 0.7 10*3/uL (ref 0.2–0.9)
Monocytes Relative: 12 %
NEUTROS PCT: 63 %
Neutro Abs: 3.6 10*3/uL (ref 1.4–6.5)
Platelets: 172 10*3/uL (ref 150–440)
RBC: 3.6 MIL/uL — ABNORMAL LOW (ref 3.80–5.20)
RDW: 12.9 % (ref 11.5–14.5)
WBC: 5.7 10*3/uL (ref 3.6–11.0)

## 2018-01-20 MED ORDER — ZOLEDRONIC ACID 4 MG/5ML IV CONC
3.3000 mg | Freq: Once | INTRAVENOUS | Status: AC
  Administered 2018-01-20: 3.3 mg via INTRAVENOUS
  Filled 2018-01-20: qty 4.13

## 2018-01-20 MED ORDER — SODIUM CHLORIDE 0.9 % IV SOLN
INTRAVENOUS | Status: DC
  Administered 2018-01-20: 11:00:00 via INTRAVENOUS
  Filled 2018-01-20: qty 1000

## 2018-01-20 NOTE — Progress Notes (Signed)
Patient denies any concerns today.  

## 2018-01-21 LAB — PROTEIN ELECTROPHORESIS, SERUM
A/G RATIO SPE: 1.6 (ref 0.7–1.7)
ALBUMIN ELP: 3.9 g/dL (ref 2.9–4.4)
Alpha-1-Globulin: 0.2 g/dL (ref 0.0–0.4)
Alpha-2-Globulin: 0.8 g/dL (ref 0.4–1.0)
Beta Globulin: 0.9 g/dL (ref 0.7–1.3)
GLOBULIN, TOTAL: 2.5 g/dL (ref 2.2–3.9)
Gamma Globulin: 0.6 g/dL (ref 0.4–1.8)
M-Spike, %: 0.2 g/dL — ABNORMAL HIGH
TOTAL PROTEIN ELP: 6.4 g/dL (ref 6.0–8.5)

## 2018-01-21 LAB — KAPPA/LAMBDA LIGHT CHAINS
Kappa free light chain: 10.3 mg/L (ref 3.3–19.4)
Kappa, lambda light chain ratio: 0 — ABNORMAL LOW (ref 0.26–1.65)
Lambda free light chains: 2159 mg/L — ABNORMAL HIGH (ref 5.7–26.3)

## 2018-01-21 LAB — IGG, IGA, IGM
IGA: 24 mg/dL — AB (ref 64–422)
IGM (IMMUNOGLOBULIN M), SRM: 11 mg/dL — AB (ref 26–217)
IgG (Immunoglobin G), Serum: 523 mg/dL — ABNORMAL LOW (ref 700–1600)

## 2018-02-04 ENCOUNTER — Encounter: Payer: Self-pay | Admitting: Nurse Practitioner

## 2018-02-04 ENCOUNTER — Ambulatory Visit: Payer: Medicare Other | Admitting: Nurse Practitioner

## 2018-02-04 VITALS — BP 136/71 | HR 71 | Temp 97.8°F | Resp 16 | Ht 62.0 in | Wt 127.2 lb

## 2018-02-04 DIAGNOSIS — J0100 Acute maxillary sinusitis, unspecified: Secondary | ICD-10-CM | POA: Diagnosis not present

## 2018-02-04 DIAGNOSIS — I10 Essential (primary) hypertension: Secondary | ICD-10-CM

## 2018-02-04 DIAGNOSIS — J3089 Other allergic rhinitis: Secondary | ICD-10-CM | POA: Diagnosis not present

## 2018-02-04 MED ORDER — AMOXICILLIN 875 MG PO TABS: 875.0000 mg | ORAL_TABLET | Freq: Two times a day (BID) | ORAL | 0 refills | Status: DC

## 2018-02-04 NOTE — Progress Notes (Signed)
[facility] [place], [place] [address]  Internal MEDICINE  Office Visit Note  Patient Name: Rhonda [name]  [id number]  [id number]  Date of Service: 02/12/2018   Pt is here for a sick visit.  Chief Complaint  Patient presents with  . Sinusitis    started in left eye area also having some nasal congestion     Sinusitis  This is a new problem. The current episode started in the past 7 days. The problem is unchanged. There has been no fever. The fever has been present for less than 1 day. The pain is moderate. Associated symptoms include chills, congestion, coughing, ear pain, headaches, sinus pressure, a sore throat and swollen glands. Past treatments include acetaminophen and oral decongestants. The treatment provided mild relief.        Current Medication:  Outpatient Encounter Medications as of 02/04/2018  Medication Sig  . aspirin EC 81 MG tablet Take 81 mg by mouth daily.  . Cyanocobalamin (B-12 PO) Take 500 mcg by mouth daily.   . diphenoxylate-atropine (LOMOTIL) 2.5-0.025 MG tablet Take 1 tablet by mouth 4 (four) times daily as needed for diarrhea or loose stools.  Marland Kitchen EPINEPHrine 0.3 mg/0.3 mL IJ SOAJ injection   . fexofenadine (ALLEGRA) 30 MG tablet Take 30 mg by mouth daily.   . fluconazole (DIFLUCAN) 150 MG tablet Take 1 tablet (150 mg total) by mouth daily.  Marland Kitchen levothyroxine (SYNTHROID, LEVOTHROID) 50 MCG tablet Take 1 tablet (50 mcg total) by mouth daily before breakfast.  . Multiple Vitamins-Minerals (CENTRUM SILVER PO) Take 1 tablet by mouth daily.  . RESTASIS 0.05 % ophthalmic emulsion Place 1 drop into both eyes daily.   Marland Kitchen triamcinolone (NASACORT ALLERGY 24HR) 55 MCG/ACT AERO nasal inhaler Place 2 sprays into the nose 2 (two) times daily as needed.   Marland Kitchen UNABLE TO FIND Inject 1 Dose as directed once a week. Allergy injections once a week   . amoxicillin (AMOXIL) 875 MG tablet Take 1 tablet (875 mg total) by mouth 2 (two) times daily.    No facility-administered encounter medications on file as of 02/04/2018.       Medical History: Past Medical History:  Diagnosis Date  . Arthritis    hands  . Benign neoplasm of ascending colon   . Cataract   . GERD (gastroesophageal reflux disease)   . Hyperlipemia   . Hypertension 01/31/2015  . Hypothyroidism   . Multiple myeloma ([place]) 04/18/2016  . Multiple myeloma in remission ([place]) 04/18/2016  . Osteoporosis   . Skin cancer    basal cell     Today's Vitals   02/04/18 1439  BP: 136/71  Pulse: 71  Resp: 16  Temp: 97.8 F (36.6 C)  SpO2: 97%  Weight: 127 lb 3.2 oz (57.7 kg)  Height: 5' 2" (1.575 m)    Review of Systems  Constitutional: Positive for chills and fatigue. Negative for activity change and fever.  HENT: Positive for congestion, ear pain, postnasal drip, rhinorrhea, sinus pressure, sinus pain, sore throat and voice change.   Eyes: Negative.   Respiratory: Positive for cough and wheezing. Negative for chest tightness.   Cardiovascular: Negative for chest pain and palpitations.  Gastrointestinal: Negative for nausea and vomiting.  Endocrine: Negative.   Genitourinary: Negative.   Musculoskeletal: Negative for arthralgias and myalgias.  Skin: Negative.   Allergic/Immunologic: Positive for environmental allergies.  Neurological: Positive for headaches.  Hematological: Positive for adenopathy.  Psychiatric/Behavioral: Negative.     Physical Exam  Constitutional: She is oriented to person, place, and time. She appears well-developed and well-nourished. No distress.  HENT:  Head: Normocephalic and atraumatic.  Right Ear: Tympanic membrane is bulging.  Left Ear: Tympanic membrane is bulging.  Nose: Rhinorrhea present. Right sinus exhibits maxillary sinus tenderness. Left sinus exhibits maxillary sinus tenderness.  Mouth/Throat: Posterior oropharyngeal erythema present. No oropharyngeal exudate.  Eyes: Pupils are equal, round, and reactive to light. EOM  are normal.  Neck: Normal range of motion. Neck supple. No JVD present. No tracheal deviation present. No thyromegaly present.  Cardiovascular: Normal rate, regular rhythm and normal heart sounds. Exam reveals no gallop and no friction rub.  No murmur heard. Pulmonary/Chest: Effort normal and breath sounds normal. No respiratory distress. She has no wheezes. She has no rales. She exhibits no tenderness.  Abdominal: Soft. Bowel sounds are normal. There is no tenderness.  Musculoskeletal: Normal range of motion.  Lymphadenopathy:    She has cervical adenopathy.  Neurological: She is alert and oriented to person, place, and time. No cranial nerve deficit.  Skin: Skin is warm and dry. She is not diaphoretic.  Psychiatric: She has a normal mood and affect. Her behavior is normal. Judgment and thought content normal.  Nursing note and vitals reviewed.  Assessment/Plan: 1. Acute non-recurrent maxillary sinusitis Start amoxicillin 856m twice daily for 10 days. Continue using OTC medication to alleviate symptoms.  - amoxicillin (AMOXIL) 875 MG tablet; Take 1 tablet (875 mg total) by mouth 2 (two) times daily.  Dispense: 20 tablet; Refill: 0  2. Seasonal allergic rhinitis due to other allergic trigger Continue allergy medication as prescribed .  3. Essential hypertension Stable.   General Counseling: AJohnnyeverbalizes understanding of the findings of todays visit and agrees with plan of treatment. I have discussed any further diagnostic evaluation that may be needed or ordered today. We also reviewed her medications today. she has been encouraged to call the office with any questions or concerns that should arise related to todays visit.    Counseling:  Rest and increase fluids. Continue using OTC medication to control symptoms.   This patient was seen by HKermitwith Dr FLavera Guiseas a part of collaborative care agreement   Meds ordered this encounter   Medications  . amoxicillin (AMOXIL) 875 MG tablet    Sig: Take 1 tablet (875 mg total) by mouth 2 (two) times daily.    Dispense:  20 tablet    Refill:  0    Order Specific Question:   Supervising Provider    Answer:   KLavera Guise[[[id number]   Time spent: 15 Minutes

## 2018-02-12 DIAGNOSIS — J0100 Acute maxillary sinusitis, unspecified: Secondary | ICD-10-CM | POA: Insufficient documentation

## 2018-02-12 DIAGNOSIS — J309 Allergic rhinitis, unspecified: Secondary | ICD-10-CM | POA: Insufficient documentation

## 2018-02-17 ENCOUNTER — Inpatient Hospital Stay: Payer: Medicare Other | Attending: Oncology

## 2018-02-17 ENCOUNTER — Inpatient Hospital Stay: Payer: Medicare Other

## 2018-02-17 VITALS — BP 103/65 | HR 67 | Temp 97.3°F | Resp 18

## 2018-02-17 DIAGNOSIS — C9001 Multiple myeloma in remission: Secondary | ICD-10-CM | POA: Diagnosis not present

## 2018-02-17 DIAGNOSIS — C9000 Multiple myeloma not having achieved remission: Secondary | ICD-10-CM

## 2018-02-17 LAB — BASIC METABOLIC PANEL
Anion gap: 5 (ref 5–15)
BUN: 15 mg/dL (ref 8–23)
CHLORIDE: 107 mmol/L (ref 98–111)
CO2: 22 mmol/L (ref 22–32)
CREATININE: 0.75 mg/dL (ref 0.44–1.00)
Calcium: 11.1 mg/dL — ABNORMAL HIGH (ref 8.9–10.3)
GFR calc Af Amer: >60 mL/min (ref >60)
GFR calc non Af Amer: >60 mL/min (ref >60)
GLUCOSE: 148 mg/dL — AB (ref 70–99)
POTASSIUM: 3.5 mmol/L (ref 3.5–5.1)
Sodium: 134 mmol/L — ABNORMAL LOW (ref 135–145)

## 2018-02-17 LAB — CBC WITH DIFFERENTIAL/PLATELET
Basophils Absolute: 0 10*3/uL (ref 0–0.1)
Basophils Relative: 1 %
EOS ABS: 0 10*3/uL (ref 0–0.7)
EOS PCT: 0 %
HCT: 34.2 % — ABNORMAL LOW (ref 35.0–47.0)
Hemoglobin: 11.8 g/dL — ABNORMAL LOW (ref 12.0–16.0)
LYMPHS ABS: 1.2 10*3/uL (ref 1.0–3.6)
LYMPHS PCT: 19 %
MCH: 33.3 pg (ref 26.0–34.0)
MCHC: 34.4 g/dL (ref 32.0–36.0)
MCV: 96.8 fL (ref 80.0–100.0)
MONO ABS: 0.7 10*3/uL (ref 0.2–0.9)
Monocytes Relative: 11 %
Neutro Abs: 4.2 10*3/uL (ref 1.4–6.5)
Neutrophils Relative %: 69 %
PLATELETS: 167 10*3/uL (ref 150–440)
RBC: 3.53 MIL/uL — ABNORMAL LOW (ref 3.80–5.20)
RDW: 13.2 % (ref 11.5–14.5)
WBC: 6.1 10*3/uL (ref 3.6–11.0)

## 2018-02-17 MED ORDER — ZOLEDRONIC ACID 4 MG/5ML IV CONC
3.3000 mg | Freq: Once | INTRAVENOUS | Status: AC
  Administered 2018-02-17: 3.3 mg via INTRAVENOUS
  Filled 2018-02-17: qty 4.13

## 2018-02-17 MED ORDER — SODIUM CHLORIDE 0.9 % IV SOLN
INTRAVENOUS | Status: DC
  Administered 2018-02-17: 14:00:00 via INTRAVENOUS
  Filled 2018-02-17: qty 250

## 2018-02-18 LAB — PROTEIN ELECTROPHORESIS, SERUM
A/G Ratio: 1.6 (ref 0.7–1.7)
ALPHA-1-GLOBULIN: 0.2 g/dL (ref 0.0–0.4)
ALPHA-2-GLOBULIN: 0.7 g/dL (ref 0.4–1.0)
Albumin ELP: 3.7 g/dL (ref 2.9–4.4)
BETA GLOBULIN: 0.8 g/dL (ref 0.7–1.3)
GLOBULIN, TOTAL: 2.3 g/dL (ref 2.2–3.9)
Gamma Globulin: 0.6 g/dL (ref 0.4–1.8)
M-SPIKE, %: 0.2 g/dL — AB
Total Protein ELP: 6 g/dL (ref 6.0–8.5)

## 2018-02-18 LAB — KAPPA/LAMBDA LIGHT CHAINS
KAPPA FREE LGHT CHN: 10.2 mg/L (ref 3.3–19.4)
Kappa, lambda light chain ratio: 0 — ABNORMAL LOW (ref 0.26–1.65)
LAMDA FREE LIGHT CHAINS: 2199.7 mg/L — AB (ref 5.7–26.3)

## 2018-02-18 LAB — IGG, IGA, IGM
IGA: 23 mg/dL — AB (ref 64–422)
IGG (IMMUNOGLOBIN G), SERUM: 510 mg/dL — AB (ref 700–1600)
IgM (Immunoglobulin M), Srm: 11 mg/dL — ABNORMAL LOW (ref 26–217)

## 2018-02-25 ENCOUNTER — Ambulatory Visit: Payer: Medicare Other | Admitting: Adult Health

## 2018-02-25 ENCOUNTER — Encounter: Payer: Self-pay | Admitting: Adult Health

## 2018-02-25 VITALS — BP 108/62 | HR 75 | Resp 16 | Ht 62.0 in | Wt 126.0 lb

## 2018-02-25 DIAGNOSIS — R300 Dysuria: Secondary | ICD-10-CM

## 2018-02-25 DIAGNOSIS — K219 Gastro-esophageal reflux disease without esophagitis: Secondary | ICD-10-CM

## 2018-02-25 DIAGNOSIS — Z0001 Encounter for general adult medical examination with abnormal findings: Secondary | ICD-10-CM

## 2018-02-25 DIAGNOSIS — Z1239 Encounter for other screening for malignant neoplasm of breast: Secondary | ICD-10-CM

## 2018-02-25 DIAGNOSIS — I10 Essential (primary) hypertension: Secondary | ICD-10-CM | POA: Diagnosis not present

## 2018-02-25 DIAGNOSIS — C9001 Multiple myeloma in remission: Secondary | ICD-10-CM | POA: Diagnosis not present

## 2018-02-25 DIAGNOSIS — E039 Hypothyroidism, unspecified: Secondary | ICD-10-CM | POA: Diagnosis not present

## 2018-02-25 DIAGNOSIS — Z1231 Encounter for screening mammogram for malignant neoplasm of breast: Secondary | ICD-10-CM

## 2018-02-25 MED ORDER — LEVOTHYROXINE SODIUM 50 MCG PO TABS: 50.0000 ug | ORAL_TABLET | Freq: Every day | ORAL | 3 refills | Status: DC

## 2018-02-25 NOTE — Patient Instructions (Signed)
Hypothyroidism Hypothyroidism is a disorder of the thyroid. The thyroid is a large gland that is located in the lower front of the neck. The thyroid releases hormones that control how the body works. With hypothyroidism, the thyroid does not make enough of these hormones. What are the causes? Causes of hypothyroidism may include:  Viral infections.  Pregnancy.  Your own defense system (immune system) attacking your thyroid.  Certain medicines.  Birth defects.  Past radiation treatments to your head or neck.  Past treatment with radioactive iodine.  Past surgical removal of part or all of your thyroid.  Problems with the gland that is located in the center of your brain (pituitary).  What are the signs or symptoms? Signs and symptoms of hypothyroidism may include:  Feeling as though you have no energy (lethargy).  Inability to tolerate cold.  Weight gain that is not explained by a change in diet or exercise habits.  Dry skin.  Coarse hair.  Menstrual irregularity.  Slowing of thought processes.  Constipation.  Sadness or depression.  How is this diagnosed? Your health care provider may diagnose hypothyroidism with blood tests and ultrasound tests. How is this treated? Hypothyroidism is treated with medicine that replaces the hormones that your body does not make. After you begin treatment, it may take several weeks for symptoms to go away. Follow these instructions at home:  Take medicines only as directed by your health care provider.  If you start taking any new medicines, tell your health care provider.  Keep all follow-up visits as directed by your health care provider. This is important. As your condition improves, your dosage needs may change. You will need to have blood tests regularly so that your health care provider can watch your condition. Contact a health care provider if:  Your symptoms do not get better with treatment.  You are taking thyroid  replacement medicine and: ? You sweat excessively. ? You have tremors. ? You feel anxious. ? You lose weight rapidly. ? You cannot tolerate heat. ? You have emotional swings. ? You have diarrhea. ? You feel weak. Get help right away if:  You develop chest pain.  You develop an irregular heartbeat.  You develop a rapid heartbeat. This information is not intended to replace advice given to you by your health care provider. Make sure you discuss any questions you have with your health care provider. Document Released: 05/28/2005 Document Revised: 11/03/2015 Document Reviewed: 10/13/2013 Elsevier Interactive Patient Education  2018 Elsevier Inc.  

## 2018-02-25 NOTE — Progress Notes (Signed)
[facility] [place], [place] [address]  Internal MEDICINE  Office Visit Note  Patient Name: Rhonda [name]  [id number]  [id number]  Date of Service: 03/04/2018  Chief Complaint  Patient presents with  . Annual Exam  . Hypertension  . Hypothyroidism  . Hyperlipidemia  . Gastroesophageal Reflux    HPI Pt is here for routine health maintenance examination.  Patient reports she is doing well.  She denies pain or needs at this time.  She is due for a mammogram, and it will be ordered at this visit.  She reports having colonoscopy a few years ago, and was told she would probably never get another one due to age.  She has a history of HTN, HLD, Hypothyroid and GERD.  She is taking all of her medications without difficulty.     Current Medication: Outpatient Encounter Medications as of 02/25/2018  Medication Sig  . aspirin EC 81 MG tablet Take 81 mg by mouth daily.  . Cyanocobalamin (B-12 PO) Take 500 mcg by mouth daily.   Marland Kitchen EPINEPHrine 0.3 mg/0.3 mL IJ SOAJ injection   . fexofenadine (ALLEGRA) 30 MG tablet Take 30 mg by mouth daily.   Marland Kitchen levothyroxine (SYNTHROID, LEVOTHROID) 50 MCG tablet Take 1 tablet (50 mcg total) by mouth daily before breakfast.  . Multiple Vitamins-Minerals (CENTRUM SILVER PO) Take 1 tablet by mouth daily.  . RESTASIS 0.05 % ophthalmic emulsion Place 1 drop into both eyes daily.   Marland Kitchen triamcinolone (NASACORT ALLERGY 24HR) 55 MCG/ACT AERO nasal inhaler Place 2 sprays into the nose 2 (two) times daily as needed.   . [DISCONTINUED] levothyroxine (SYNTHROID, LEVOTHROID) 50 MCG tablet Take 1 tablet (50 mcg total) by mouth daily before breakfast.  . UNABLE TO FIND Inject 1 Dose as directed once a week. Allergy injections once a week   . [DISCONTINUED] amoxicillin (AMOXIL) 875 MG tablet Take 1 tablet (875 mg total) by mouth 2 (two) times daily. (Patient not taking: Reported on 02/25/2018)  . [DISCONTINUED] diphenoxylate-atropine (LOMOTIL) 2.5-0.025  MG tablet Take 1 tablet by mouth 4 (four) times daily as needed for diarrhea or loose stools. (Patient not taking: Reported on 02/25/2018)  . [DISCONTINUED] fluconazole (DIFLUCAN) 150 MG tablet Take 1 tablet (150 mg total) by mouth daily. (Patient not taking: Reported on 02/25/2018)   No facility-administered encounter medications on file as of 02/25/2018.     Surgical History: Past Surgical History:  Procedure Laterality Date  . BREAST EXCISIONAL BIOPSY Right 15+ yrs ago   EXCISIONAL - NEG  . CATARACT EXTRACTION W/ INTRAOCULAR LENS IMPLANT    . CHOLECYSTECTOMY N/A 03/14/2017   Procedure: LAPAROSCOPIC CHOLECYSTECTOMY;  Surgeon: Florene Glen, MD;  Location: [facility];  Service: General;  Laterality: N/A;  . COLONOSCOPY WITH PROPOFOL N/A 02/21/2015   Procedure: COLONOSCOPY WITH PROPOFOL;  Surgeon: Lucilla Lame, MD;  Location: [facility];  Service: Endoscopy;  Laterality: N/A;  . [place]  . POLYPECTOMY  02/21/2015   Procedure: POLYPECTOMY;  Surgeon: Lucilla Lame, MD;  Location: [facility];  Service: Endoscopy;;    Medical History: Past Medical History:  Diagnosis Date  . Arthritis    hands  . Benign neoplasm of ascending colon   . Cataract   . GERD (gastroesophageal reflux disease)   . Hyperlipemia   . Hypertension 01/31/2015  . Hypothyroidism   . Multiple myeloma ([place]) 04/18/2016  . Multiple myeloma in remission ([place]) 04/18/2016  . Osteoporosis   . Skin cancer  basal cell    Family History: Family History  Problem Relation Age of Onset  . Heart disease Mother   . Stroke Father   . Heart disease Brother   . Kidney cancer Neg Hx   . Prostate cancer Neg Hx   . Bladder Cancer Neg Hx   . Breast cancer Neg Hx       Review of Systems  Constitutional: Negative for chills, fatigue and unexpected weight change.  HENT: Negative for congestion, rhinorrhea, sneezing and sore throat.   Eyes: Negative for photophobia, pain and redness.   Respiratory: Negative for cough, chest tightness and shortness of breath.   Cardiovascular: Negative for chest pain and palpitations.  Gastrointestinal: Negative for abdominal pain, constipation, diarrhea, nausea and vomiting.  Endocrine: Negative.   Genitourinary: Negative for dysuria and frequency.  Musculoskeletal: Negative for arthralgias, back pain, joint swelling and neck pain.  Skin: Negative for rash.  Allergic/Immunologic: Negative.   Neurological: Negative for tremors and numbness.  Hematological: Negative for adenopathy. Does not bruise/bleed easily.  Psychiatric/Behavioral: Negative for behavioral problems and sleep disturbance. The patient is not nervous/anxious.     Vital Signs: BP 108/62   Pulse 75   Resp 16   Ht _0  (1.575 m)   Wt 126 lb (57.2 kg)   SpO2 94%   BMI 23.05 kg/m    Physical Exam  Constitutional: She is oriented to person, place, and time. She appears well-developed and well-nourished. No distress.  HENT:  Head: Normocephalic and atraumatic.  Mouth/Throat: Oropharynx is clear and moist. No oropharyngeal exudate.  Eyes: Pupils are equal, round, and reactive to light. EOM are normal.  Neck: Normal range of motion. Neck supple. No JVD present. No tracheal deviation present. No thyromegaly present.  Cardiovascular: Normal rate, regular rhythm and normal heart sounds. Exam reveals no gallop and no friction rub.  No murmur heard. Pulmonary/Chest: Effort normal and breath sounds normal. No respiratory distress. She has no wheezes. She has no rales. She exhibits no mass and no tenderness. No breast swelling, tenderness or discharge.  Chaperoned by Otila Kluver CMA.  Abdominal: Soft. There is no tenderness. There is no guarding.  Musculoskeletal: Normal range of motion.  Lymphadenopathy:    She has no cervical adenopathy.  Neurological: She is alert and oriented to person, place, and time. No cranial nerve deficit.  Skin: Skin is warm and dry. She is not  diaphoretic.  Psychiatric: She has a normal mood and affect. Her behavior is normal. Judgment and thought content normal.  Nursing note and vitals reviewed.    LABS: Recent Results (from the past 2160 hour(s))  Kappa/lambda light chains     Status: Abnormal   Collection Time: 12/18/17  1:18 PM  Result Value Ref Range   Kappa free light chain 11.0 3.3 - 19.4 mg/L   Lamda free light chains 2,325.2 (H) 5.7 - 26.3 mg/L   Kappa, lamda light chain ratio 0.00 (L) 0.26 - 1.65    Comment: (NOTE) Performed At: [facility] [place], [place] [id number] Rush Farmer MD [id number]   CBC with Differential/Platelet     Status: Abnormal   Collection Time: 12/18/17  1:18 PM  Result Value Ref Range   WBC 9.8 3.6 - 11.0 K/uL   RBC 3.64 (L) 3.80 - 5.20 MIL/uL   Hemoglobin 12.3 12.0 - 16.0 g/dL   HCT 35.2 35.0 - 47.0 %   MCV 96.8 80.0 - 100.0 fL   MCH 33.7 26.0 - 34.0 pg  MCHC 34.8 32.0 - 36.0 g/dL   RDW 12.8 11.5 - 14.5 %   Platelets 224 150 - 440 K/uL   Neutrophils Relative % 76 %   Neutro Abs 7.4 (H) 1.4 - 6.5 K/uL   Lymphocytes Relative 14 %   Lymphs Abs 1.3 1.0 - 3.6 K/uL   Monocytes Relative 9 %   Monocytes Absolute 0.9 0.2 - 0.9 K/uL   Eosinophils Relative 0 %   Eosinophils Absolute 0.0 0 - 0.7 K/uL   Basophils Relative 1 %   Basophils Absolute 0.1 0 - 0.1 K/uL    Comment: Performed at [facility], [address], [place], [place] [address]  Basic metabolic panel     Status: Abnormal   Collection Time: 12/18/17  1:18 PM  Result Value Ref Range   Sodium 136 135 - 145 mmol/L   Potassium 3.6 3.5 - 5.1 mmol/L   Chloride 107 98 - 111 mmol/L    Comment: Please note change in reference range.   CO2 22 22 - 32 mmol/L   Glucose, Bld 128 (H) 70 - 99 mg/dL    Comment: Please note change in reference range.   BUN 16 8 - 23 mg/dL    Comment: Please note change in reference range.   Creatinine, Ser 0.99 0.44 - 1.00 mg/dL   Calcium 11.7 (H) 8.9 - 10.3  mg/dL   GFR calc non Af Amer 52 (L) >60 mL/min   GFR calc Af Amer 60 (L) >60 mL/min    Comment: (NOTE) The eGFR has been calculated using the CKD EPI equation. This calculation has not been validated in all clinical situations. eGFR's persistently <60 mL/min signify possible Chronic Kidney Disease.    Anion gap 7 5 - 15    Comment: Performed at [facility], [address] [place],  [address]  Protein electrophoresis, serum     Status: Abnormal   Collection Time: 12/18/17  1:18 PM  Result Value Ref Range   Total Protein ELP 6.3 6.0 - 8.5 g/dL   Albumin ELP 3.9 2.9 - 4.4 g/dL   Alpha-1-Globulin 0.2 0.0 - 0.4 g/dL   Alpha-2-Globulin 0.7 0.4 - 1.0 g/dL   Beta Globulin 0.9 0.7 - 1.3 g/dL   Gamma Globulin 0.6 0.4 - 1.8 g/dL   M-Spike, % 0.3 (H) Not Observed g/dL   SPE Interp. Comment     Comment: (NOTE) The SPE pattern demonstrates a single peak (M-spike) in the gamma region which may represent monoclonal protein. This peak may also be caused by circulating immune complexes, cryoglobulins, C-reactive protein, fibrinogen or hemolysis.  If clinically indicated, the presence of a monoclonal gammopathy may be confirmed by immuno- fixation, as well as an evaluation of the urine for the presence of Bence-Jones protein. Performed At: [facility] [place], [place] [id number] Rush Farmer MD [id number]    Comment Comment     Comment: (NOTE) Protein electrophoresis scan will follow via computer, mail, or courier delivery.    GLOBULIN, TOTAL 2.4 2.2 - 3.9 g/dL   A/G Ratio 1.6 0.7 - 1.7  IgG, IgA, IgM     Status: Abnormal   Collection Time: 12/18/17  1:18 PM  Result Value Ref Range   IgG (Immunoglobin G), Serum 556 (L) 700 - 1,600 mg/dL   IgA 31 (L) 64 - 422 mg/dL    Comment: Result confirmed on concentration.   IgM (Immunoglobulin M), Srm 13 (L) 26 - 217 mg/dL    Comment: (NOTE)  Result confirmed on concentration. Performed At: [facility] [place], [place] [id number] Rush Farmer MD [id number]   Kappa/lambda light chains     Status: Abnormal   Collection Time: 01/20/18  9:51 AM  Result Value Ref Range   Kappa free light chain 10.3 3.3 - 19.4 mg/L   Lamda free light chains 2,159.0 (H) 5.7 - 26.3 mg/L   Kappa, lamda light chain ratio 0.00 (L) 0.26 - 1.65    Comment: (NOTE) Performed At: [facility] [place], [place] [id number] Rush Farmer MD [id number]   IgG, IgA, IgM     Status: Abnormal   Collection Time: 01/20/18  9:51 AM  Result Value Ref Range   IgG (Immunoglobin G), Serum 523 (L) 700 - 1,600 mg/dL   IgA 24 (L) 64 - 422 mg/dL    Comment: Result confirmed on concentration.   IgM (Immunoglobulin M), Srm 11 (L) 26 - 217 mg/dL    Comment: (NOTE) Result confirmed on concentration. Performed At: [facility] [place], [place] [id number] Rush Farmer MD [id number]   Protein electrophoresis, serum     Status: Abnormal   Collection Time: 01/20/18  9:51 AM  Result Value Ref Range   Total Protein ELP 6.4 6.0 - 8.5 g/dL   Albumin ELP 3.9 2.9 - 4.4 g/dL   Alpha-1-Globulin 0.2 0.0 - 0.4 g/dL   Alpha-2-Globulin 0.8 0.4 - 1.0 g/dL   Beta Globulin 0.9 0.7 - 1.3 g/dL   Gamma Globulin 0.6 0.4 - 1.8 g/dL   M-Spike, % 0.2 (H) Not Observed g/dL   SPE Interp. Comment     Comment: (NOTE) The SPE pattern demonstrates a single peak (M-spike) in the gamma region which may represent monoclonal protein. This peak may also be caused by circulating immune complexes, cryoglobulins, C-reactive protein, fibrinogen or hemolysis.  If clinically indicated, the presence of a monoclonal gammopathy may be confirmed by immuno- fixation, as well as an evaluation of the urine for the presence of Bence-Jones protein. Performed At: [facility] [place], [place] [id number] Rush Farmer MD [id number]    Comment Comment     Comment:  (NOTE) Protein electrophoresis scan will follow via computer, mail, or courier delivery.    GLOBULIN, TOTAL 2.5 2.2 - 3.9 g/dL   A/G Ratio 1.6 0.7 - 1.7  Basic metabolic panel     Status: Abnormal   Collection Time: 01/20/18  9:51 AM  Result Value Ref Range   Sodium 137 135 - 145 mmol/L   Potassium 3.5 3.5 - 5.1 mmol/L   Chloride 107 98 - 111 mmol/L   CO2 21 (L) 22 - 32 mmol/L   Glucose, Bld 106 (H) 70 - 99 mg/dL   BUN 15 8 - 23 mg/dL   Creatinine, Ser 0.86 0.44 - 1.00 mg/dL   Calcium 11.2 (H) 8.9 - 10.3 mg/dL   GFR calc non Af Amer >60 >60 mL/min   GFR calc Af Amer >60 >60 mL/min    Comment: (NOTE) The eGFR has been calculated using the CKD EPI equation. This calculation has not been validated in all clinical situations. eGFR's persistently <60 mL/min signify possible Chronic Kidney Disease.    Anion gap 9 5 - 15    Comment: Performed at [facility], [address] [place], [place] [address]  CBC with Differential/Platelet     Status: Abnormal   Collection Time: 01/20/18  9:51 AM  Result Value Ref Range   WBC  5.7 3.6 - 11.0 K/uL   RBC 3.60 (L) 3.80 - 5.20 MIL/uL   Hemoglobin 12.1 12.0 - 16.0 g/dL   HCT 34.9 (L) 35.0 - 47.0 %   MCV 97.2 80.0 - 100.0 fL   MCH 33.5 26.0 - 34.0 pg   MCHC 34.5 32.0 - 36.0 g/dL   RDW 12.9 11.5 - 14.5 %   Platelets 172 150 - 440 K/uL   Neutrophils Relative % 63 %   Neutro Abs 3.6 1.4 - 6.5 K/uL   Lymphocytes Relative 24 %   Lymphs Abs 1.4 1.0 - 3.6 K/uL   Monocytes Relative 12 %   Monocytes Absolute 0.7 0.2 - 0.9 K/uL   Eosinophils Relative 1 %   Eosinophils Absolute 0.1 0 - 0.7 K/uL   Basophils Relative 0 %   Basophils Absolute 0.0 0 - 0.1 K/uL    Comment: Performed at [facility], [address] [place], [place] [address]  Kappa/lambda light chains     Status: Abnormal   Collection Time: 02/17/18 12:52 PM  Result Value Ref Range   Kappa free light chain 10.2 3.3 - 19.4 mg/L   Lamda free light chains 2,199.7 (H) 5.7  - 26.3 mg/L   Kappa, lamda light chain ratio 0.00 (L) 0.26 - 1.65    Comment: (NOTE) Performed At: [facility] [place], [place] [id number] Rush Farmer MD [id number]   IgG, IgA, IgM     Status: Abnormal   Collection Time: 02/17/18 12:52 PM  Result Value Ref Range   IgG (Immunoglobin G), Serum 510 (L) 700 - 1,600 mg/dL   IgA 23 (L) 64 - 422 mg/dL    Comment: Result confirmed on concentration.   IgM (Immunoglobulin M), Srm 11 (L) 26 - 217 mg/dL    Comment: (NOTE) Result confirmed on concentration. Performed At: [facility] [place], [place] [id number] Rush Farmer MD [id number]   Protein electrophoresis, serum     Status: Abnormal   Collection Time: 02/17/18 12:52 PM  Result Value Ref Range   Total Protein ELP 6.0 6.0 - 8.5 g/dL   Albumin ELP 3.7 2.9 - 4.4 g/dL   Alpha-1-Globulin 0.2 0.0 - 0.4 g/dL   Alpha-2-Globulin 0.7 0.4 - 1.0 g/dL   Beta Globulin 0.8 0.7 - 1.3 g/dL   Gamma Globulin 0.6 0.4 - 1.8 g/dL   M-Spike, % 0.2 (H) Not Observed g/dL   SPE Interp. Comment     Comment: (NOTE) The SPE pattern demonstrates a single peak (M-spike) in the gamma region which may represent monoclonal protein. This peak may also be caused by circulating immune complexes, cryoglobulins, C-reactive protein, fibrinogen or hemolysis.  If clinically indicated, the presence of a monoclonal gammopathy may be confirmed by immuno- fixation, as well as an evaluation of the urine for the presence of Bence-Jones protein. Performed At: [facility] [place], [place] [id number] Rush Farmer MD [id number]    Comment Comment     Comment: (NOTE) Protein electrophoresis scan will follow via computer, mail, or courier delivery.    GLOBULIN, TOTAL 2.3 2.2 - 3.9 g/dL   A/G Ratio 1.6 0.7 - 1.7  Basic metabolic panel     Status: Abnormal   Collection Time: 02/17/18 12:52 PM  Result Value Ref Range   Sodium 134 (L) 135 -  145 mmol/L   Potassium 3.5 3.5 - 5.1 mmol/L   Chloride 107 98 - 111 mmol/L   CO2 22 22 - 32 mmol/L  Glucose, Bld 148 (H) 70 - 99 mg/dL   BUN 15 8 - 23 mg/dL   Creatinine, Ser 0.75 0.44 - 1.00 mg/dL   Calcium 11.1 (H) 8.9 - 10.3 mg/dL   GFR calc non Af Amer >60 >60 mL/min   GFR calc Af Amer >60 >60 mL/min    Comment: (NOTE) The eGFR has been calculated using the CKD EPI equation. This calculation has not been validated in all clinical situations. eGFR's persistently <60 mL/min signify possible Chronic Kidney Disease.    Anion gap 5 5 - 15    Comment: Performed at [facility], [address] [place], [place] [address]  CBC with Differential/Platelet     Status: Abnormal   Collection Time: 02/17/18 12:52 PM  Result Value Ref Range   WBC 6.1 3.6 - 11.0 K/uL   RBC 3.53 (L) 3.80 - 5.20 MIL/uL   Hemoglobin 11.8 (L) 12.0 - 16.0 g/dL   HCT 34.2 (L) 35.0 - 47.0 %   MCV 96.8 80.0 - 100.0 fL   MCH 33.3 26.0 - 34.0 pg   MCHC 34.4 32.0 - 36.0 g/dL   RDW 13.2 11.5 - 14.5 %   Platelets 167 150 - 440 K/uL   Neutrophils Relative % 69 %   Neutro Abs 4.2 1.4 - 6.5 K/uL   Lymphocytes Relative 19 %   Lymphs Abs 1.2 1.0 - 3.6 K/uL   Monocytes Relative 11 %   Monocytes Absolute 0.7 0.2 - 0.9 K/uL   Eosinophils Relative 0 %   Eosinophils Absolute 0.0 0 - 0.7 K/uL   Basophils Relative 1 %   Basophils Absolute 0.0 0 - 0.1 K/uL    Comment: Performed at [facility], [address] [place], [place] [address]  UA/M w/rflx Culture, Routine     Status: Abnormal   Collection Time: 02/25/18 10:50 AM  Result Value Ref Range   Specific Gravity, UA 1.025 1.005 - 1.030   pH, UA 6.0 5.0 - 7.5   Color, UA Yellow Yellow   Appearance Ur Clear Clear   Leukocytes, UA Negative Negative   Protein, UA 2+ (A) Negative/Trace   Glucose, UA Negative Negative   Ketones, UA Negative Negative   RBC, UA Negative Negative   Bilirubin, UA Negative Negative   Urobilinogen, Ur 0.2 0.2 - 1.0 mg/dL    Nitrite, UA Negative Negative   Microscopic Examination See below:     Comment: Microscopic was indicated and was performed.   Urinalysis Reflex Comment     Comment: This specimen will not reflex to a Urine Culture.  Microscopic Examination     Status: None   Collection Time: 02/25/18 10:50 AM  Result Value Ref Range   WBC, UA 0-5 0 - 5 /hpf   RBC, UA 0-2 0 - 2 /hpf   Epithelial Cells (non renal) 0-10 0 - 10 /hpf   Casts None seen None seen /lpf   Mucus, UA Present Not Estab.   Bacteria, UA None seen None seen/Few   Depression screen [facility] 2/9 02/25/2018 02/04/2018 09/19/2017 06/19/2017  Decreased Interest 0 0 0 0  Down, Depressed, Hopeless 0 0 0 0  PHQ - 2 Score 0 0 0 0    Functional Status Survey: Is the patient deaf or have difficulty hearing?: No Does the patient have difficulty seeing, even when wearing glasses/contacts?: No Does the patient have difficulty concentrating, remembering, or making decisions?: No Does the patient have difficulty walking or climbing stairs?: No Does the patient have  difficulty dressing or bathing?: No Does the patient have difficulty doing errands alone such as visiting a doctor's office or shopping?: No  MMSE - [place] Exam 02/25/2018  Orientation to time 5  Orientation to Place 5  Registration 3  Attention/ Calculation 5  Recall 3  Language- name 2 objects 2  Language- repeat 1  Language- follow 3 step command 3  Language- read & follow direction 1  Write a sentence 1  Copy design 1  Total score 30    Fall Risk  02/25/2018 02/17/2018 02/04/2018 09/19/2017 06/19/2017  Falls in the past year? _0    Assessment/Plan: 1. Encounter for general adult medical examination with abnormal findings PHM is current Will follow up on results with patient as needed.   2. Hypertension Resolved.  Pt takes no medications.   3. Gastroesophageal reflux disease without esophagitis Patient takes no medications, denies symptoms.  Reports after  Gallbladder was removed, she has had no issues.   4. Acquired hypothyroidism Refilled medication. - levothyroxine (SYNTHROID, LEVOTHROID) 50 MCG tablet; Take 1 tablet (50 mcg total) by mouth daily before breakfast.  Dispense: 90 tablet; Refill: 3  5. Multiple myeloma in remission ([place]) Pt sees Dr. Grayland Ormond.  She has two more appointments before they declare that she is in remission.  6. Dysuria - UA/M w/rflx Culture, Routine  7. Screening for breast cancer - MM DIGITAL SCREENING BILATERAL; Future  General Counseling: Ziva verbalizes understanding of the findings of todays visit and agrees with plan of treatment. I have discussed any further diagnostic evaluation that may be needed or ordered today. We also reviewed her medications today. she has been encouraged to call the office with any questions or concerns that should arise related to todays visit.  Orders Placed This Encounter  Procedures  . Microscopic Examination  . MM DIGITAL SCREENING BILATERAL  . UA/M w/rflx Culture, Routine    Meds ordered this encounter  Medications  . levothyroxine (SYNTHROID, LEVOTHROID) 50 MCG tablet    Sig: Take 1 tablet (50 mcg total) by mouth daily before breakfast.    Dispense:  90 tablet    Refill:  3    Time spent: 25 Minutes  This patient was seen by Orson Gear AGNP-C in Collaboration with Dr Lavera Guise as a part of collaborative care agreement  Lavera Guise, MD  Internal Medicine

## 2018-02-26 LAB — UA/M W/RFLX CULTURE, ROUTINE
BILIRUBIN UA: NEGATIVE
Glucose, UA: NEGATIVE
KETONES UA: NEGATIVE
LEUKOCYTES UA: NEGATIVE
Nitrite, UA: NEGATIVE
RBC UA: NEGATIVE
Specific Gravity, UA: 1.025 (ref 1.005–1.030)
UUROB: 0.2 mg/dL (ref 0.2–1.0)
pH, UA: 6 (ref 5.0–7.5)

## 2018-02-26 LAB — MICROSCOPIC EXAMINATION
BACTERIA UA: NONE SEEN
Casts: NONE SEEN /lpf

## 2018-03-11 ENCOUNTER — Ambulatory Visit
Admission: RE | Admit: 2018-03-11 | Discharge: 2018-03-11 | Disposition: A | Discharge status: Home / Self Care | Payer: Medicare Other | Source: Ambulatory Visit | Attending: Internal Medicine | Admitting: Internal Medicine

## 2018-03-11 DIAGNOSIS — Z1239 Encounter for other screening for malignant neoplasm of breast: Secondary | ICD-10-CM

## 2018-03-11 DIAGNOSIS — Z1231 Encounter for screening mammogram for malignant neoplasm of breast: Secondary | ICD-10-CM | POA: Insufficient documentation

## 2018-03-17 ENCOUNTER — Inpatient Hospital Stay: Payer: Medicare Other | Attending: Oncology

## 2018-03-17 ENCOUNTER — Inpatient Hospital Stay: Payer: Medicare Other

## 2018-03-17 VITALS — BP 110/61 | HR 76 | Temp 97.0°F | Resp 20

## 2018-03-17 DIAGNOSIS — C9001 Multiple myeloma in remission: Secondary | ICD-10-CM | POA: Diagnosis not present

## 2018-03-17 DIAGNOSIS — C9000 Multiple myeloma not having achieved remission: Secondary | ICD-10-CM

## 2018-03-17 LAB — BASIC METABOLIC PANEL
Anion gap: 4 — ABNORMAL LOW (ref 5–15)
BUN: 18 mg/dL (ref 8–23)
CALCIUM: 11.7 mg/dL — AB (ref 8.9–10.3)
CO2: 23 mmol/L (ref 22–32)
CREATININE: 1.17 mg/dL — AB (ref 0.44–1.00)
Chloride: 112 mmol/L — ABNORMAL HIGH (ref 98–111)
GFR, EST AFRICAN AMERICAN: 49 mL/min — AB (ref >60)
GFR, EST NON AFRICAN AMERICAN: 42 mL/min — AB (ref >60)
Glucose, Bld: 112 mg/dL — ABNORMAL HIGH (ref 70–99)
Potassium: 3.8 mmol/L (ref 3.5–5.1)
SODIUM: 139 mmol/L (ref 135–145)

## 2018-03-17 LAB — CBC WITH DIFFERENTIAL/PLATELET
BASOS ABS: 0 10*3/uL (ref 0–0.1)
BASOS PCT: 1 %
EOS PCT: 2 %
Eosinophils Absolute: 0.1 10*3/uL (ref 0–0.7)
HCT: 34.6 % — ABNORMAL LOW (ref 35.0–47.0)
Hemoglobin: 12 g/dL (ref 12.0–16.0)
Lymphocytes Relative: 26 %
Lymphs Abs: 1.5 10*3/uL (ref 1.0–3.6)
MCH: 33.8 pg (ref 26.0–34.0)
MCHC: 34.8 g/dL (ref 32.0–36.0)
MCV: 97 fL (ref 80.0–100.0)
Monocytes Absolute: 0.7 10*3/uL (ref 0.2–0.9)
Monocytes Relative: 13 %
Neutro Abs: 3.3 10*3/uL (ref 1.4–6.5)
Neutrophils Relative %: 58 %
PLATELETS: 177 10*3/uL (ref 150–440)
RBC: 3.56 MIL/uL — AB (ref 3.80–5.20)
RDW: 13.5 % (ref 11.5–14.5)
WBC: 5.7 10*3/uL (ref 3.6–11.0)

## 2018-03-17 MED ORDER — ZOLEDRONIC ACID 4 MG/5ML IV CONC
3.3000 mg | Freq: Once | INTRAVENOUS | Status: AC
  Administered 2018-03-17: 3.3 mg via INTRAVENOUS
  Filled 2018-03-17: qty 4.13

## 2018-03-17 MED ORDER — SODIUM CHLORIDE 0.9 % IV SOLN
INTRAVENOUS | Status: DC
  Administered 2018-03-17: 13:00:00 via INTRAVENOUS
  Filled 2018-03-17: qty 250

## 2018-03-18 LAB — PROTEIN ELECTROPHORESIS, SERUM
A/G Ratio: 1.6 (ref 0.7–1.7)
Albumin ELP: 3.7 g/dL (ref 2.9–4.4)
Alpha-1-Globulin: 0.2 g/dL (ref 0.0–0.4)
Alpha-2-Globulin: 0.6 g/dL (ref 0.4–1.0)
Beta Globulin: 0.9 g/dL (ref 0.7–1.3)
GAMMA GLOBULIN: 0.6 g/dL (ref 0.4–1.8)
Globulin, Total: 2.3 g/dL (ref 2.2–3.9)
M-Spike, %: 0.3 g/dL — ABNORMAL HIGH
TOTAL PROTEIN ELP: 6 g/dL (ref 6.0–8.5)

## 2018-03-18 LAB — KAPPA/LAMBDA LIGHT CHAINS
KAPPA, LAMDA LIGHT CHAIN RATIO: 0 — AB (ref 0.26–1.65)
Kappa free light chain: 10.8 mg/L (ref 3.3–19.4)
Lambda free light chains: 2611.7 mg/L — ABNORMAL HIGH (ref 5.7–26.3)

## 2018-03-18 LAB — IGG, IGA, IGM
IGA: 21 mg/dL — AB (ref 64–422)
IGM (IMMUNOGLOBULIN M), SRM: 10 mg/dL — AB (ref 26–217)
IgG (Immunoglobin G), Serum: 480 mg/dL — ABNORMAL LOW (ref 700–1600)

## 2018-04-13 NOTE — Progress Notes (Signed)
[place]  [phone] [phone]) [phone]  ID: Rhonda [name] OB: 07/02/1933  MR#: [id number]  VZD#:[id number]  Patient Care Team: Lavera Guise, MD as PCP - General (Internal Medicine)  CHIEF COMPLAINT:  Multiple myeloma in remission.  INTERVAL HISTORY: Patient returns to clinic today for repeat laboratory work, further evaluation, and continuation of Zometa.  She continues to feel well and remains asymptomatic.  Her peripheral neuropathy is unchanged and does not affect her day-to-day activity.  She has no other neurologic complaints. She denies any recent fevers or illnesses. She has a good appetite and denies weight loss. She denies any chest [name] or shortness of breath. She denies any nausea, vomiting, constipation, or diarrhea.  She has no hematuria, melena, or hematochezia. She has no urinary complaints.  Patient offers no specific complaints today.  REVIEW OF SYSTEMS:   Review of Systems  Constitutional: Negative.  Negative for fever, malaise/fatigue and weight loss.  HENT: Negative for congestion and sore throat.   Respiratory: Negative.  Negative for cough and shortness of breath.   Cardiovascular: Negative.  Negative for chest [name], palpitations and leg swelling.  Gastrointestinal: Negative.  Negative for abdominal [name], blood in stool, constipation, diarrhea, melena, nausea and vomiting.  Genitourinary: Negative.  Negative for dysuria.  Musculoskeletal: Negative.  Negative for back [name].  Skin: Negative.  Negative for rash.  Neurological: Positive for sensory change. Negative for tingling, focal weakness and weakness.  Psychiatric/Behavioral: Negative.  The patient is not nervous/anxious and does not have insomnia.     As per HPI. Otherwise, a complete review of systems is negative.  PAST MEDICAL HISTORY: Past Medical History:  Diagnosis Date  . Arthritis    hands  . Benign neoplasm of ascending colon   . Cataract   . GERD  (gastroesophageal reflux disease)   . Hyperlipemia   . Hypertension 01/31/2015  . Hypothyroidism   . Multiple myeloma ([place]) 04/18/2016  . Multiple myeloma in remission ([place]) 04/18/2016  . Osteoporosis   . Skin cancer    basal cell    PAST SURGICAL HISTORY: Past Surgical History:  Procedure Laterality Date  . BREAST EXCISIONAL BIOPSY Right 15+ yrs ago   EXCISIONAL - NEG  . CATARACT EXTRACTION W/ INTRAOCULAR LENS IMPLANT    . CHOLECYSTECTOMY N/A 03/14/2017   Procedure: LAPAROSCOPIC CHOLECYSTECTOMY;  Surgeon: Florene Glen, MD;  Location: [facility];  Service: General;  Laterality: N/A;  . COLONOSCOPY WITH PROPOFOL N/A 02/21/2015   Procedure: COLONOSCOPY WITH PROPOFOL;  Surgeon: Lucilla Lame, MD;  Location: [place];  Service: Endoscopy;  Laterality: N/A;  . [place]  . POLYPECTOMY  02/21/2015   Procedure: POLYPECTOMY;  Surgeon: Lucilla Lame, MD;  Location: [place];  Service: Endoscopy;;    FAMILY HISTORY: Family History  Problem Relation Age of Onset  . Heart disease Mother   . Stroke Father   . Heart disease Brother   . Kidney cancer Neg Hx   . Prostate cancer Neg Hx   . Bladder Cancer Neg Hx   . Breast cancer Neg Hx     ADVANCED DIRECTIVES (Y/N):  N  HEALTH MAINTENANCE: Social History   Tobacco Use  . Smoking status: Never Smoker  . Smokeless tobacco: Never Used  Substance Use Topics  . Alcohol use: No    Alcohol/week: 0.0 standard drinks  . Drug use: No     Colonoscopy:  PAP:  Bone density:  Lipid panel:  No Known Allergies  Current  Outpatient Medications  Medication Sig Dispense Refill  . aspirin EC 81 MG tablet Take 81 mg by mouth daily.    . Cyanocobalamin (B-12 PO) Take 500 mcg by mouth daily.     Marland Kitchen EPINEPHrine 0.3 mg/0.3 mL IJ SOAJ injection     . fexofenadine (ALLEGRA) 30 MG tablet Take 30 mg by mouth daily.     Marland Kitchen levothyroxine (SYNTHROID, LEVOTHROID) 50 MCG tablet Take 1 tablet (50 mcg total) by mouth  daily before breakfast. 90 tablet 3  . Multiple Vitamins-Minerals (CENTRUM SILVER PO) Take 1 tablet by mouth daily.    . RESTASIS 0.05 % ophthalmic emulsion Place 1 drop into both eyes daily.     Marland Kitchen triamcinolone (NASACORT ALLERGY 24HR) 55 MCG/ACT AERO nasal inhaler Place 2 sprays into the nose 2 (two) times daily as needed.     Marland Kitchen UNABLE TO FIND Inject 1 Dose as directed once a week. Allergy injections once a week      No current facility-administered medications for this visit.    Facility-Administered Medications Ordered in Other Visits  Medication Dose Route Frequency Provider Last Rate Last Dose  . 0.9 %  sodium chloride infusion   Intravenous Continuous Lloyd Huger, MD   Stopped at 04/14/18 1146    OBJECTIVE: Vitals:   04/14/18 1028  BP: 104/65  Pulse: 77  Resp: 18  Temp: 97.6 F (36.4 C)     Body mass index is 23.42 kg/m.    ECOG FS:0 - Asymptomatic  General: Well-developed, well-nourished, no acute distress. Eyes: Pink conjunctiva, anicteric sclera. HEENT: Normocephalic, moist mucous membranes. Lungs: Clear to auscultation bilaterally. Heart: Regular rate and rhythm. No rubs, murmurs, or gallops. Abdomen: Soft, nontender, nondistended. No organomegaly noted, normoactive bowel sounds. Musculoskeletal: No edema, cyanosis, or clubbing. Neuro: Alert, answering all questions appropriately. Cranial nerves grossly intact. Skin: No rashes or petechiae noted. Psych: Normal affect.  LAB RESULTS:  Lab Results  Component Value Date   NA 137 04/14/2018   K 4.1 04/14/2018   CL 106 04/14/2018   CO2 24 04/14/2018   GLUCOSE 102 (H) 04/14/2018   BUN 20 04/14/2018   CREATININE 0.92 04/14/2018   CALCIUM 11.7 (H) 04/14/2018   PROT 6.5 03/04/2017   ALBUMIN 4.2 03/04/2017   AST 24 03/04/2017   ALT 16 03/04/2017   ALKPHOS 49 03/04/2017   BILITOT 0.5 03/04/2017   GFRNONAA 56 (L) 04/14/2018   GFRAA >60 04/14/2018    Lab Results  Component Value Date   WBC 6.5 04/14/2018    NEUTROABS 3.9 04/14/2018   HGB 12.2 04/14/2018   HCT 36.3 04/14/2018   MCV 96.8 04/14/2018   PLT 204 04/14/2018   Lab Results  Component Value Date   TOTALPROTELP 6.0 03/17/2018   ALBUMINELP 3.7 03/17/2018   A1GS 0.2 03/17/2018   A2GS 0.6 03/17/2018   BETS 0.9 03/17/2018   GAMS 0.6 03/17/2018   MSPIKE 0.3 (H) 03/17/2018   SPEI Comment 03/17/2018     STUDIES: No results found.  ASSESSMENT:  Multiple myeloma in remission.  PLAN:    1.  Multiple myeloma in remission:  Patient's M spike appears to fluctuate between 0.0 and 0.2 although her most recent result was 0.3.  Her lambda free light chains remain persistently elevated fluctuating between 2000 and 2400.  Her most recent results have trended up slightly and are greater than 2600.  IgG, IgM, and IgA are all mildly decreased and unchanged.  Previously, bone marrow biopsy had 44% plasma cells with normal  cytogenetics. Given the results of her metastatic bone survey, her elevated lambda free chains, and her bone marrow biopsy, patient fit the criteria for multiple myeloma. Because of patient's peripheral neuropathy, Revlimid was discontinued and patient did not receive maintenance treatment. Her neuropathy is minimal, so can reuse Revlimid in the future if necessary. Patient initiated monthly Zometa on May 07, 2016.  Proceed with 3.3 mg Zometa today.  Return to clinic in 3 months for further evaluation and continuation of Zometa.   2. Hypercalcemia: Patient's calcium levels have been persistently elevated and relatively unchanged despite receiving monthly Zometa.  Continue to monitor.  Zometa as above.  I spent a total of 30 minutes face-to-face with the patient of which greater than 50% of the visit was spent in counseling and coordination of care as detailed above.   Patient expressed understanding and was in agreement with this plan. She also understands that She can call clinic at any time with any questions, concerns, or  complaints.    Lloyd Huger, MD 04/14/18 1:50 PM

## 2018-04-14 ENCOUNTER — Encounter: Payer: Self-pay | Admitting: Oncology

## 2018-04-14 ENCOUNTER — Inpatient Hospital Stay: Payer: Medicare Other

## 2018-04-14 ENCOUNTER — Inpatient Hospital Stay (HOSPITAL_BASED_OUTPATIENT_CLINIC_OR_DEPARTMENT_OTHER): Payer: Medicare Other | Admitting: Oncology

## 2018-04-14 ENCOUNTER — Inpatient Hospital Stay: Payer: Medicare Other | Attending: Oncology

## 2018-04-14 VITALS — BP 104/65 | HR 77 | Temp 97.6°F | Resp 18 | Wt 128.1 lb

## 2018-04-14 DIAGNOSIS — Z79899 Other long term (current) drug therapy: Secondary | ICD-10-CM | POA: Insufficient documentation

## 2018-04-14 DIAGNOSIS — Z9221 Personal history of antineoplastic chemotherapy: Secondary | ICD-10-CM | POA: Insufficient documentation

## 2018-04-14 DIAGNOSIS — C9001 Multiple myeloma in remission: Secondary | ICD-10-CM

## 2018-04-14 DIAGNOSIS — C9000 Multiple myeloma not having achieved remission: Secondary | ICD-10-CM

## 2018-04-14 LAB — CBC WITH DIFFERENTIAL/PLATELET
Abs Immature Granulocytes: 0.08 10*3/uL — ABNORMAL HIGH (ref 0.00–0.07)
BASOS ABS: 0 10*3/uL (ref 0.0–0.1)
Basophils Relative: 1 %
EOS ABS: 0 10*3/uL (ref 0.0–0.5)
EOS PCT: 1 %
HEMATOCRIT: 36.3 % (ref 36.0–46.0)
HEMOGLOBIN: 12.2 g/dL (ref 12.0–15.0)
Immature Granulocytes: 1 %
LYMPHS ABS: 1.6 10*3/uL (ref 0.7–4.0)
Lymphocytes Relative: 24 %
MCH: 32.5 pg (ref 26.0–34.0)
MCHC: 33.6 g/dL (ref 30.0–36.0)
MCV: 96.8 fL (ref 80.0–100.0)
MONO ABS: 0.9 10*3/uL (ref 0.1–1.0)
Monocytes Relative: 13 %
Neutro Abs: 3.9 10*3/uL (ref 1.7–7.7)
Neutrophils Relative %: 60 %
Platelets: 204 10*3/uL (ref 150–400)
RBC: 3.75 MIL/uL — ABNORMAL LOW (ref 3.87–5.11)
RDW: 12.3 % (ref 11.5–15.5)
WBC: 6.5 10*3/uL (ref 4.0–10.5)
nRBC: 0 % (ref 0.0–0.2)

## 2018-04-14 LAB — BASIC METABOLIC PANEL
Anion gap: 7 (ref 5–15)
BUN: 20 mg/dL (ref 8–23)
CALCIUM: 11.7 mg/dL — AB (ref 8.9–10.3)
CO2: 24 mmol/L (ref 22–32)
CREATININE: 0.92 mg/dL (ref 0.44–1.00)
Chloride: 106 mmol/L (ref 98–111)
GFR calc Af Amer: >60 mL/min (ref >60)
GFR calc non Af Amer: 56 mL/min — ABNORMAL LOW (ref >60)
Glucose, Bld: 102 mg/dL — ABNORMAL HIGH (ref 70–99)
Potassium: 4.1 mmol/L (ref 3.5–5.1)
SODIUM: 137 mmol/L (ref 135–145)

## 2018-04-14 MED ORDER — ZOLEDRONIC ACID 4 MG/5ML IV CONC
3.3000 mg | Freq: Once | INTRAVENOUS | Status: AC
  Administered 2018-04-14: 3.3 mg via INTRAVENOUS
  Filled 2018-04-14: qty 4.13

## 2018-04-14 MED ORDER — SODIUM CHLORIDE 0.9 % IV SOLN
INTRAVENOUS | Status: DC
  Administered 2018-04-14: 11:00:00 via INTRAVENOUS
  Filled 2018-04-14: qty 250

## 2018-04-14 NOTE — Progress Notes (Signed)
Pt in for labs, follow up and zometa today.  Denies any concerns.

## 2018-04-15 LAB — PROTEIN ELECTROPHORESIS, SERUM
A/G RATIO SPE: 1.7 (ref 0.7–1.7)
Albumin ELP: 4 g/dL (ref 2.9–4.4)
Alpha-1-Globulin: 0.2 g/dL (ref 0.0–0.4)
Alpha-2-Globulin: 0.7 g/dL (ref 0.4–1.0)
BETA GLOBULIN: 0.9 g/dL (ref 0.7–1.3)
GAMMA GLOBULIN: 0.5 g/dL (ref 0.4–1.8)
Globulin, Total: 2.3 g/dL (ref 2.2–3.9)
M-Spike, %: 0.2 g/dL — ABNORMAL HIGH
TOTAL PROTEIN ELP: 6.3 g/dL (ref 6.0–8.5)

## 2018-04-15 LAB — IGG, IGA, IGM
IGA: 23 mg/dL — AB (ref 64–422)
IGG (IMMUNOGLOBIN G), SERUM: 548 mg/dL — AB (ref 700–1600)
IGM (IMMUNOGLOBULIN M), SRM: 12 mg/dL — AB (ref 26–217)

## 2018-04-15 LAB — KAPPA/LAMBDA LIGHT CHAINS
KAPPA FREE LGHT CHN: 11.3 mg/L (ref 3.3–19.4)
Kappa, lambda light chain ratio: 0 — ABNORMAL LOW (ref 0.26–1.65)
Lambda free light chains: 3408.7 mg/L — ABNORMAL HIGH (ref 5.7–26.3)

## 2018-04-28 DIAGNOSIS — C439 Malignant melanoma of skin, unspecified: Secondary | ICD-10-CM

## 2018-04-28 HISTORY — DX: Malignant melanoma of skin, unspecified: C43.9

## 2018-06-17 ENCOUNTER — Ambulatory Visit: Payer: Medicare Other | Admitting: Nurse Practitioner

## 2018-06-17 ENCOUNTER — Encounter: Payer: Self-pay | Admitting: Nurse Practitioner

## 2018-06-17 VITALS — BP 129/69 | HR 61 | Temp 97.3°F | Resp 16 | Ht 62.0 in | Wt 134.2 lb

## 2018-06-17 DIAGNOSIS — I10 Essential (primary) hypertension: Secondary | ICD-10-CM | POA: Diagnosis not present

## 2018-06-17 DIAGNOSIS — R509 Fever, unspecified: Secondary | ICD-10-CM | POA: Diagnosis not present

## 2018-06-17 DIAGNOSIS — J069 Acute upper respiratory infection, unspecified: Secondary | ICD-10-CM | POA: Diagnosis not present

## 2018-06-17 LAB — POCT RAPID STREP A (OFFICE): RAPID STREP A SCREEN: NEGATIVE

## 2018-06-17 LAB — POC INFLUENZA TEST: NEGATIVE: NEGATIVE

## 2018-06-17 MED ORDER — CEFUROXIME AXETIL 500 MG PO TABS: 500.0000 mg | ORAL_TABLET | Freq: Two times a day (BID) | ORAL | 0 refills | Status: DC

## 2018-06-17 NOTE — Progress Notes (Signed)
[facility] [place],  [address]  Internal MEDICINE  Office Visit Note  Patient Name: Rhonda [name]  [id number]  [id number]  Date of Service: 06/18/2018   Pt is here for a sick visit  Chief Complaint  Patient presents with  . Nasal Congestion    yelowish/greenish mucous  . Fever    pt has had cough, congestion, fever and chills for  . Cough    all symptoms been going on for about a week, scratchy throat  . Chills     The patient is here for sick visit. She has cough, congestion, and headache. Has also had low grade fever off and on. She denies nausea or vomiting. Symptoms have been going on for several days. They are gradually getting worse. She has taken some over the counter medications which have not really helped.   .     Current Medication:  Outpatient Encounter Medications as of 06/17/2018  Medication Sig  . aspirin EC 81 MG tablet Take 81 mg by mouth daily.  . Cyanocobalamin (B-12 PO) Take 500 mcg by mouth daily.   Marland Kitchen EPINEPHrine 0.3 mg/0.3 mL IJ SOAJ injection   . fexofenadine (ALLEGRA) 30 MG tablet Take 30 mg by mouth daily.   Marland Kitchen levothyroxine (SYNTHROID, LEVOTHROID) 50 MCG tablet Take 1 tablet (50 mcg total) by mouth daily before breakfast.  . Multiple Vitamins-Minerals (CENTRUM SILVER PO) Take 1 tablet by mouth daily.  . RESTASIS 0.05 % ophthalmic emulsion Place 1 drop into both eyes daily.   Marland Kitchen triamcinolone (NASACORT ALLERGY 24HR) 55 MCG/ACT AERO nasal inhaler Place 2 sprays into the nose 2 (two) times daily as needed.   Marland Kitchen UNABLE TO FIND Inject 1 Dose as directed once a week. Allergy injections once a week   . cefUROXime (CEFTIN) 500 MG tablet Take 1 tablet (500 mg total) by mouth 2 (two) times daily with a meal.   No facility-administered encounter medications on file as of 06/17/2018.       Medical History: Past Medical History:  Diagnosis Date  . Arthritis    hands  . Benign neoplasm of ascending colon   . Cataract    . GERD (gastroesophageal reflux disease)   . Hyperlipemia   . Hypertension 01/31/2015  . Hypothyroidism   . Multiple myeloma ([place]) 04/18/2016  . Multiple myeloma in remission ([place]) 04/18/2016  . Osteoporosis   . Skin cancer    basal cell     Today's Vitals   06/17/18 1144  BP: 129/69  Pulse: 61  Resp: 16  Temp: (!) 97.3 F (36.3 C)  SpO2: 96%  Weight: 134 lb 3.2 oz (60.9 kg)  Height: '5\' 2"'  (1.575 m)    Review of Systems  Constitutional: Positive for chills, fatigue and fever.  HENT: Positive for congestion, ear pain, postnasal drip, rhinorrhea, sinus pain and sore throat. Negative for voice change.   Respiratory: Positive for cough and wheezing.   Cardiovascular: Negative for chest pain and palpitations.  Gastrointestinal: Negative for nausea and vomiting.  Endocrine: Negative.   Musculoskeletal: Positive for myalgias.  Skin: Negative for rash.  Allergic/Immunologic: Positive for environmental allergies.  Neurological: Positive for headaches.  Hematological: Positive for adenopathy.    Physical Exam Vitals signs and nursing note reviewed.  Constitutional:      General: She is not in acute distress.    Appearance: She is well-developed. She is ill-appearing. She is not diaphoretic.  HENT:     Head: Normocephalic and atraumatic.  Right Ear: Tympanic membrane is bulging.     Left Ear: Tympanic membrane is bulging.     Nose: Congestion present.     Right Sinus: Frontal sinus tenderness present.     Left Sinus: Frontal sinus tenderness present.     Mouth/Throat:     Pharynx: No oropharyngeal exudate.  Eyes:     Pupils: Pupils are equal, round, and reactive to light.  Neck:     Musculoskeletal: Normal range of motion and neck supple.     Thyroid: No thyromegaly.     Vascular: No JVD.     Trachea: No tracheal deviation.  Cardiovascular:     Rate and Rhythm: Normal rate and regular rhythm.     Heart sounds: Normal heart sounds. No murmur. No friction rub. No  gallop.   Pulmonary:     Effort: Pulmonary effort is normal. No respiratory distress.     Breath sounds: Normal breath sounds. No wheezing or rales.  Chest:     Chest wall: No tenderness.  Abdominal:     General: Bowel sounds are normal.     Palpations: Abdomen is soft.  Musculoskeletal: Normal range of motion.  Lymphadenopathy:     Cervical: Cervical adenopathy present.  Skin:    General: Skin is warm and dry.  Neurological:     Mental Status: She is alert and oriented to person, place, and time.     Cranial Nerves: No cranial nerve deficit.  Psychiatric:        Behavior: Behavior normal.        Thought Content: Thought content normal.        Judgment: Judgment normal.    Assessment/Plan: 1. Fever and chills Testing done for flu and strep. Both negative.  - POC INFLUENZA TEST - POCT rapid strep A  2. Acute upper respiratory infection Start ceftin 558m bid for 10 days. Rest and increase fluids. Continue to use OTC medicatoins to alleviate symptoms.  - cefUROXime (CEFTIN) 500 MG tablet; Take 1 tablet (500 mg total) by mouth 2 (two) times daily with a meal.  Dispense: 20 tablet; Refill: 0  3. Essential hypertension Stable. Continue bp medication as prescribed.  General Counseling: AFlaraverbalizes understanding of the findings of todays visit and agrees with plan of treatment. I have discussed any further diagnostic evaluation that may be needed or ordered today. We also reviewed her medications today. she has been encouraged to call the office with any questions or concerns that should arise related to todays visit.    Counseling:  Rest and increase fluids. Continue using OTC medication to control symptoms.   This patient was seen by HLeretha PolFNP Collaboration with Dr FLavera Guiseas a part of collaborative care agreement  Orders Placed This Encounter  Procedures  . POC INFLUENZA TEST  . POCT rapid strep A    Meds ordered this encounter  Medications  .  cefUROXime (CEFTIN) 500 MG tablet    Sig: Take 1 tablet (500 mg total) by mouth 2 (two) times daily with a meal.    Dispense:  20 tablet    Refill:  0    Order Specific Question:   Supervising Provider    Answer:   KLavera Guise[[[id number]   Time spent: 25 Minutes

## 2018-06-18 DIAGNOSIS — R509 Fever, unspecified: Secondary | ICD-10-CM | POA: Insufficient documentation

## 2018-06-18 DIAGNOSIS — J069 Acute upper respiratory infection, unspecified: Secondary | ICD-10-CM | POA: Insufficient documentation

## 2018-06-27 ENCOUNTER — Ambulatory Visit: Payer: Self-pay | Admitting: Adult Health

## 2018-07-07 ENCOUNTER — Encounter: Payer: Self-pay | Admitting: Adult Health

## 2018-07-07 ENCOUNTER — Ambulatory Visit: Payer: Medicare Other | Admitting: Adult Health

## 2018-07-07 VITALS — BP 110/72 | HR 77 | Resp 16 | Ht 62.0 in | Wt 128.0 lb

## 2018-07-07 DIAGNOSIS — E785 Hyperlipidemia, unspecified: Secondary | ICD-10-CM

## 2018-07-07 DIAGNOSIS — E039 Hypothyroidism, unspecified: Secondary | ICD-10-CM

## 2018-07-07 DIAGNOSIS — I10 Essential (primary) hypertension: Secondary | ICD-10-CM

## 2018-07-07 DIAGNOSIS — K219 Gastro-esophageal reflux disease without esophagitis: Secondary | ICD-10-CM

## 2018-07-07 NOTE — Progress Notes (Signed)
[facility] [place], [place] [address]  Internal MEDICINE  Office Visit Note  Patient Name: Rhonda [name]  [id number]  [id number]  Date of Service: 07/16/2018  Chief Complaint  Patient presents with  . Hyperlipidemia  . Hypertension  . Gastroesophageal Reflux  . Hypothyroidism    HPI  Pt here follow up on hyperlipidemia, hypertension, GERD, and hypothyroid.  Patient denies any issues currently with her hypertension or hyperlipidemia.  She can not remember the last time she had any symptoms of GERD and appears to be managing this well using over-the-counter occasion.  She generally is doing well and denies any needs at this time.   Current Medication: Outpatient Encounter Medications as of 07/07/2018  Medication Sig  . aspirin EC 81 MG tablet Take 81 mg by mouth daily.  . cefUROXime (CEFTIN) 500 MG tablet Take 1 tablet (500 mg total) by mouth 2 (two) times daily with a meal.  . Cyanocobalamin (B-12 PO) Take 500 mcg by mouth daily.   Marland Kitchen EPINEPHrine 0.3 mg/0.3 mL IJ SOAJ injection   . fexofenadine (ALLEGRA) 30 MG tablet Take 30 mg by mouth daily.   Marland Kitchen levothyroxine (SYNTHROID, LEVOTHROID) 50 MCG tablet Take 1 tablet (50 mcg total) by mouth daily before breakfast.  . Multiple Vitamins-Minerals (CENTRUM SILVER PO) Take 1 tablet by mouth daily.  . RESTASIS 0.05 % ophthalmic emulsion Place 1 drop into both eyes daily.   Marland Kitchen triamcinolone (NASACORT ALLERGY 24HR) 55 MCG/ACT AERO nasal inhaler Place 2 sprays into the nose 2 (two) times daily as needed.   Marland Kitchen UNABLE TO FIND Inject 1 Dose as directed once a week. Allergy injections once a week    No facility-administered encounter medications on file as of 07/07/2018.     Surgical History: Past Surgical History:  Procedure Laterality Date  . BREAST EXCISIONAL BIOPSY Right 15+ yrs ago   EXCISIONAL - NEG  . CATARACT EXTRACTION W/ INTRAOCULAR LENS IMPLANT    . CHOLECYSTECTOMY N/A 03/14/2017   Procedure: LAPAROSCOPIC  CHOLECYSTECTOMY;  Surgeon: Florene Glen, MD;  Location: [facility];  Service: General;  Laterality: N/A;  . COLONOSCOPY WITH PROPOFOL N/A 02/21/2015   Procedure: COLONOSCOPY WITH PROPOFOL;  Surgeon: Lucilla Lame, MD;  Location: [place];  Service: Endoscopy;  Laterality: N/A;  . [place]  . POLYPECTOMY  02/21/2015   Procedure: POLYPECTOMY;  Surgeon: Lucilla Lame, MD;  Location: [place];  Service: Endoscopy;;    Medical History: Past Medical History:  Diagnosis Date  . Arthritis    hands  . Benign neoplasm of ascending colon   . Cataract   . GERD (gastroesophageal reflux disease)   . Hyperlipemia   . Hypertension 01/31/2015  . Hypothyroidism   . Multiple myeloma ([place]) 04/18/2016  . Multiple myeloma in remission ([place]) 04/18/2016  . Osteoporosis   . Skin cancer    basal cell    Family History: Family History  Problem Relation Age of Onset  . Heart disease Mother   . Stroke Father   . Heart disease Brother   . Kidney cancer Neg Hx   . Prostate cancer Neg Hx   . Bladder Cancer Neg Hx   . Breast cancer Neg Hx     Social History   Socioeconomic History  . Marital status: Widowed    Spouse name: Not on file  . Number of children: Not on file  . Years of education: Not on file  . Highest education level: Not on  file  Occupational History  . Not on file  Social Needs  . Financial resource strain: Not on file  . Food insecurity:    Worry: Not on file    Inability: Not on file  . Transportation needs:    Medical: Not on file    Non-medical: Not on file  Tobacco Use  . Smoking status: Never Smoker  . Smokeless tobacco: Never Used  Substance and Sexual Activity  . Alcohol use: No    Alcohol/week: 0.0 standard drinks  . Drug use: No  . Sexual activity: Not on file  Lifestyle  . Physical activity:    Days per week: Not on file    Minutes per session: Not on file  . Stress: Not on file  Relationships  . Social connections:     Talks on phone: Not on file    Gets together: Not on file    Attends religious service: Not on file    Active member of club or organization: Not on file    Attends meetings of clubs or organizations: Not on file    Relationship status: Not on file  . Intimate partner violence:    Fear of current or ex partner: Not on file    Emotionally abused: Not on file    Physically abused: Not on file    Forced sexual activity: Not on file  Other Topics Concern  . Not on file  Social History Narrative  . Not on file      Review of Systems  Constitutional: Negative for chills, fatigue and unexpected weight change.  HENT: Negative for congestion, rhinorrhea, sneezing and sore throat.   Eyes: Negative for photophobia, pain and redness.  Respiratory: Negative for cough, chest tightness and shortness of breath.   Cardiovascular: Negative for chest pain and palpitations.  Gastrointestinal: Negative for abdominal pain, constipation, diarrhea, nausea and vomiting.  Endocrine: Negative.   Genitourinary: Negative for dysuria and frequency.  Musculoskeletal: Negative for arthralgias, back pain, joint swelling and neck pain.  Skin: Negative for rash.  Allergic/Immunologic: Negative.   Neurological: Negative for tremors and numbness.  Hematological: Negative for adenopathy. Does not bruise/bleed easily.  Psychiatric/Behavioral: Negative for behavioral problems and sleep disturbance. The patient is not nervous/anxious.     Vital Signs: BP 110/72   Pulse 77   Resp 16   Ht 5' 2" (1.575 m)   Wt 128 lb (58.1 kg)   SpO2 97%   BMI 23.41 kg/m    Physical Exam Vitals signs and nursing note reviewed.  Constitutional:      General: She is not in acute distress.    Appearance: She is well-developed. She is not diaphoretic.  HENT:     Head: Normocephalic and atraumatic.     Mouth/Throat:     Pharynx: No oropharyngeal exudate.  Eyes:     Pupils: Pupils are equal, round, and reactive to light.   Neck:     Musculoskeletal: Normal range of motion and neck supple.     Thyroid: No thyromegaly.     Vascular: No JVD.     Trachea: No tracheal deviation.  Cardiovascular:     Rate and Rhythm: Normal rate and regular rhythm.     Heart sounds: Normal heart sounds. No murmur. No friction rub. No gallop.   Pulmonary:     Effort: Pulmonary effort is normal. No respiratory distress.     Breath sounds: Normal breath sounds. No wheezing or rales.  Chest:     Chest  wall: No tenderness.  Abdominal:     Palpations: Abdomen is soft.     Tenderness: There is no abdominal tenderness. There is no guarding.  Musculoskeletal: Normal range of motion.  Lymphadenopathy:     Cervical: No cervical adenopathy.  Skin:    General: Skin is warm and dry.  Neurological:     Mental Status: She is alert and oriented to person, place, and time.     Cranial Nerves: No cranial nerve deficit.  Psychiatric:        Behavior: Behavior normal.        Thought Content: Thought content normal.        Judgment: Judgment normal.    Assessment/Plan: 1. Essential hypertension Stable, continue current regimen and supportive care.  2. Acquired hypothyroidism Stable, continue current therapy.  Will review patient's TSH and free T4 at next blood draw.  3. Gastroesophageal reflux disease without esophagitis Patient not currently taking any RX medications for GERD.  Encourage patient to continue dietary restrictions as well as OTC locations.  4. Hyperlipidemia, unspecified hyperlipidemia type Stable, will review patient's lipid panel at next physical.  General Counseling: Selin verbalizes understanding of the findings of todays visit and agrees with plan of treatment. I have discussed any further diagnostic evaluation that may be needed or ordered today. We also reviewed her medications today. she has been encouraged to call the office with any questions or concerns that should arise related to todays visit.    No  orders of the defined types were placed in this encounter.   No orders of the defined types were placed in this encounter.   Time spent: 20 Minutes   This patient was seen by Orson Gear AGNP-C in Collaboration with Dr Lavera Guise as a part of collaborative care agreement     Kendell Bane AGNP-C Internal medicine

## 2018-07-07 NOTE — Patient Instructions (Signed)

## 2018-07-14 ENCOUNTER — Inpatient Hospital Stay: Payer: Medicare Other | Attending: Oncology

## 2018-07-14 DIAGNOSIS — C9001 Multiple myeloma in remission: Secondary | ICD-10-CM | POA: Diagnosis present

## 2018-07-14 DIAGNOSIS — G629 Polyneuropathy, unspecified: Secondary | ICD-10-CM | POA: Diagnosis not present

## 2018-07-14 DIAGNOSIS — I10 Essential (primary) hypertension: Secondary | ICD-10-CM | POA: Insufficient documentation

## 2018-07-14 DIAGNOSIS — E039 Hypothyroidism, unspecified: Secondary | ICD-10-CM | POA: Insufficient documentation

## 2018-07-14 DIAGNOSIS — C9000 Multiple myeloma not having achieved remission: Secondary | ICD-10-CM

## 2018-07-14 LAB — CBC WITH DIFFERENTIAL/PLATELET
Abs Immature Granulocytes: 0.04 10*3/uL (ref 0.00–0.07)
Basophils Absolute: 0 10*3/uL (ref 0.0–0.1)
Basophils Relative: 1 %
EOS ABS: 0.1 10*3/uL (ref 0.0–0.5)
Eosinophils Relative: 1 %
HEMATOCRIT: 34.9 % — AB (ref 36.0–46.0)
Hemoglobin: 11.6 g/dL — ABNORMAL LOW (ref 12.0–15.0)
Immature Granulocytes: 1 %
LYMPHS ABS: 1.1 10*3/uL (ref 0.7–4.0)
Lymphocytes Relative: 18 %
MCH: 32.2 pg (ref 26.0–34.0)
MCHC: 33.2 g/dL (ref 30.0–36.0)
MCV: 96.9 fL (ref 80.0–100.0)
Monocytes Absolute: 0.8 10*3/uL (ref 0.1–1.0)
Monocytes Relative: 13 %
Neutro Abs: 4.1 10*3/uL (ref 1.7–7.7)
Neutrophils Relative %: 66 %
Platelets: 166 10*3/uL (ref 150–400)
RBC: 3.6 MIL/uL — ABNORMAL LOW (ref 3.87–5.11)
RDW: 12.6 % (ref 11.5–15.5)
WBC: 6.1 10*3/uL (ref 4.0–10.5)
nRBC: 0 % (ref 0.0–0.2)

## 2018-07-14 LAB — BASIC METABOLIC PANEL
Anion gap: 4 — ABNORMAL LOW (ref 5–15)
BUN: 19 mg/dL (ref 8–23)
CALCIUM: 10 mg/dL (ref 8.9–10.3)
CO2: 22 mmol/L (ref 22–32)
Chloride: 111 mmol/L (ref 98–111)
Creatinine, Ser: 0.73 mg/dL (ref 0.44–1.00)
GFR calc non Af Amer: >60 mL/min (ref >60)
Glucose, Bld: 94 mg/dL (ref 70–99)
Potassium: 4.4 mmol/L (ref 3.5–5.1)
Sodium: 137 mmol/L (ref 135–145)

## 2018-07-15 LAB — KAPPA/LAMBDA LIGHT CHAINS
Kappa free light chain: 10.1 mg/L (ref 3.3–19.4)
Kappa, lambda light chain ratio: 0 — ABNORMAL LOW (ref 0.26–1.65)
Lambda free light chains: 3583.2 mg/L — ABNORMAL HIGH (ref 5.7–26.3)

## 2018-07-15 LAB — PROTEIN ELECTROPHORESIS, SERUM
A/G Ratio: 1.6 (ref 0.7–1.7)
Albumin ELP: 3.7 g/dL (ref 2.9–4.4)
Alpha-1-Globulin: 0.2 g/dL (ref 0.0–0.4)
Alpha-2-Globulin: 0.6 g/dL (ref 0.4–1.0)
Beta Globulin: 0.9 g/dL (ref 0.7–1.3)
Gamma Globulin: 0.6 g/dL (ref 0.4–1.8)
Globulin, Total: 2.3 g/dL (ref 2.2–3.9)
M-Spike, %: 0.2 g/dL — ABNORMAL HIGH
Total Protein ELP: 6 g/dL (ref 6.0–8.5)

## 2018-07-15 LAB — IGG, IGA, IGM
IgA: 20 mg/dL — ABNORMAL LOW (ref 64–422)
IgG (Immunoglobin G), Serum: 500 mg/dL — ABNORMAL LOW (ref 700–1600)
IgM (Immunoglobulin M), Srm: 10 mg/dL — ABNORMAL LOW (ref 26–217)

## 2018-07-18 NOTE — Progress Notes (Signed)
Rhonda [name]  Telephone:(336) [phone] Fax:(336) [phone]  ID: [name] OB: [birth year]  MR#: [id number]  LEX#:[id number]  Patient Care Team: Lavera Guise, MD as PCP - General (Internal Medicine)  CHIEF COMPLAINT:  Multiple myeloma in remission.  INTERVAL HISTORY: Patient returns to clinic today for repeat laboratory work, further evaluation, and continuation of Zometa.  She continues to feel well and remains asymptomatic. Her peripheral neuropathy is unchanged and does not affect her day-to-day activity.  She has no other neurologic complaints. She denies any recent fevers or illnesses. She has a good appetite and denies weight loss. She denies any chest pain or shortness of breath. She denies any nausea, vomiting, constipation, or diarrhea.  She has no hematuria, melena, or hematochezia. She has no urinary complaints.  Patient feels at her baseline offers no further specific complaints today.  REVIEW OF SYSTEMS:   Review of Systems  Constitutional: Negative.  Negative for fever, malaise/fatigue and weight loss.  HENT: Negative for congestion and sore throat.   Respiratory: Negative.  Negative for cough and shortness of breath.   Cardiovascular: Negative.  Negative for chest pain, palpitations and leg swelling.  Gastrointestinal: Negative.  Negative for abdominal pain, blood in stool, constipation, diarrhea, melena, nausea and vomiting.  Genitourinary: Negative.  Negative for dysuria.  Musculoskeletal: Negative.  Negative for back pain.  Skin: Negative.  Negative for rash.  Neurological: Positive for sensory change. Negative for tingling, focal weakness and weakness.  Psychiatric/Behavioral: Negative.  The patient is not nervous/anxious and does not have insomnia.     As per HPI. Otherwise, a complete review of systems is negative.  PAST MEDICAL HISTORY: Past Medical History:  Diagnosis Date  . Arthritis    hands  . Benign neoplasm of ascending colon   .  Cataract   . GERD (gastroesophageal reflux disease)   . Hyperlipemia   . Hypertension 01/31/2015  . Hypothyroidism   . Multiple myeloma ([place]) 04/18/2016  . Multiple myeloma in remission ([place]) 04/18/2016  . Osteoporosis   . Skin cancer    basal cell    PAST SURGICAL HISTORY: Past Surgical History:  Procedure Laterality Date  . BREAST EXCISIONAL BIOPSY Right 15+ yrs ago   EXCISIONAL - NEG  . CATARACT EXTRACTION W/ INTRAOCULAR LENS IMPLANT    . CHOLECYSTECTOMY N/A 03/14/2017   Procedure: LAPAROSCOPIC CHOLECYSTECTOMY;  Surgeon: Florene Glen, MD;  Location: [facility];  Service: General;  Laterality: N/A;  . COLONOSCOPY WITH PROPOFOL N/A 02/21/2015   Procedure: COLONOSCOPY WITH PROPOFOL;  Surgeon: Lucilla Lame, MD;  Location: [place];  Service: Endoscopy;  Laterality: N/A;  . [place]  . POLYPECTOMY  02/21/2015   Procedure: POLYPECTOMY;  Surgeon: Lucilla Lame, MD;  Location: [place];  Service: Endoscopy;;    FAMILY HISTORY: Family History  Problem Relation Age of Onset  . Heart disease Mother   . Stroke Father   . Heart disease Brother   . Kidney cancer Neg Hx   . Prostate cancer Neg Hx   . Bladder Cancer Neg Hx   . Breast cancer Neg Hx     ADVANCED DIRECTIVES (Y/N):  N  HEALTH MAINTENANCE: Social History   Tobacco Use  . Smoking status: Never Smoker  . Smokeless tobacco: Never Used  Substance Use Topics  . Alcohol use: No    Alcohol/week: 0.0 standard drinks  . Drug use: No     Colonoscopy:  PAP:  Bone density:  Lipid panel:  No  Known Allergies  Current Outpatient Medications  Medication Sig Dispense Refill  . aspirin EC 81 MG tablet Take 81 mg by mouth daily.    . Cyanocobalamin (B-12 PO) Take 500 mcg by mouth daily.     Marland Kitchen EPINEPHrine 0.3 mg/0.3 mL IJ SOAJ injection     . fexofenadine (ALLEGRA) 30 MG tablet Take 30 mg by mouth daily.     Marland Kitchen levothyroxine (SYNTHROID, LEVOTHROID) 50 MCG tablet Take 1 tablet (50 mcg  total) by mouth daily before breakfast. 90 tablet 3  . Multiple Vitamins-Minerals (CENTRUM SILVER PO) Take 1 tablet by mouth daily.    . RESTASIS 0.05 % ophthalmic emulsion Place 1 drop into both eyes daily.     Marland Kitchen triamcinolone (NASACORT ALLERGY 24HR) 55 MCG/ACT AERO nasal inhaler Place 2 sprays into the nose 2 (two) times daily as needed.     Marland Kitchen UNABLE TO FIND Inject 1 Dose as directed once a week. Allergy injections once a week      No current facility-administered medications for this visit.     OBJECTIVE: Vitals:   07/21/18 1339  BP: (!) 154/68  Pulse: 83  Resp: 18  Temp: 98 F (36.7 C)     Body mass index is 23.23 kg/m.    ECOG FS:0 - Asymptomatic  General: Well-developed, well-nourished, no acute distress. Eyes: Pink conjunctiva, anicteric sclera. HEENT: Normocephalic, moist mucous membranes. Lungs: Clear to auscultation bilaterally. Heart: Regular rate and rhythm. No rubs, murmurs, or gallops. Abdomen: Soft, nontender, nondistended. No organomegaly noted, normoactive bowel sounds. Musculoskeletal: No edema, cyanosis, or clubbing. Neuro: Alert, answering all questions appropriately. Cranial nerves grossly intact. Skin: No rashes or petechiae noted. Psych: Normal affect.  LAB RESULTS:  Lab Results  Component Value Date   NA 137 07/14/2018   K 4.4 07/14/2018   CL 111 07/14/2018   CO2 22 07/14/2018   GLUCOSE 94 07/14/2018   BUN 19 07/14/2018   CREATININE 0.73 07/14/2018   CALCIUM 10.0 07/14/2018   PROT 6.5 03/04/2017   ALBUMIN 4.2 03/04/2017   AST 24 03/04/2017   ALT 16 03/04/2017   ALKPHOS 49 03/04/2017   BILITOT 0.5 03/04/2017   GFRNONAA >60 07/14/2018   GFRAA >60 07/14/2018    Lab Results  Component Value Date   WBC 6.1 07/14/2018   NEUTROABS 4.1 07/14/2018   HGB 11.6 (L) 07/14/2018   HCT 34.9 (L) 07/14/2018   MCV 96.9 07/14/2018   PLT 166 07/14/2018   Lab Results  Component Value Date   TOTALPROTELP 6.0 07/14/2018   ALBUMINELP 3.7 07/14/2018    A1GS 0.2 07/14/2018   A2GS 0.6 07/14/2018   BETS 0.9 07/14/2018   GAMS 0.6 07/14/2018   MSPIKE 0.2 (H) 07/14/2018   SPEI Comment 07/14/2018     STUDIES: No results found.  ASSESSMENT:  Multiple myeloma in remission.  PLAN:    1.  Multiple myeloma in remission: Bone marrow biopsy on April 09, 2016 revealed 44% plasma cells and normal cytogenetics. Given the results of her metastatic bone survey on March 12, 2016, her elevated lambda free chains, and hypercalcemia, patient fit the criteria for multiple myeloma and was treated with Revlimid. Patient's M spike appears to fluctuate between 0.0 and 0.2 and her most recent result from July 14, 2018 revealed an M spike of 0.2.  IgG, IgM, and IgA are all mildly decreased and unchanged.  Despite this, her lambda free chains continue to trend up and are now 3583.  She continues to have a normal CBC,  creatinine, and calcium level.  Because of patient's peripheral neuropathy, Revlimid was discontinued and patient did not receive maintenance treatment. Her neuropathy is minimal, so can reuse Revlimid in the future if necessary. Patient initiated monthly Zometa on May 07, 2016.  Proceed with 3.3 mg Zometa today.  Return to clinic in 3 months for further evaluation and continuation of Zometa.   2. Hypercalcemia: Resolved.  Patient's calcium levels are 10.0.  Patient's calcium levels have been persistently elevated and relatively unchanged despite receiving monthly Zometa.  Continue to monitor.  Zometa as above.  I spent a total of 30 minutes face-to-face with the patient of which greater than 50% of the visit was spent in counseling and coordination of care as detailed above.   Patient expressed understanding and was in agreement with this plan. She also understands that She can call clinic at any time with any questions, concerns, or complaints.    Lloyd Huger, MD 07/22/18 6:28 AM

## 2018-07-21 ENCOUNTER — Inpatient Hospital Stay: Payer: Medicare Other

## 2018-07-21 ENCOUNTER — Encounter: Payer: Self-pay | Admitting: Oncology

## 2018-07-21 ENCOUNTER — Inpatient Hospital Stay (HOSPITAL_BASED_OUTPATIENT_CLINIC_OR_DEPARTMENT_OTHER): Payer: Medicare Other | Admitting: Oncology

## 2018-07-21 VITALS — BP 154/68 | HR 83 | Temp 98.0°F | Resp 18 | Wt 127.0 lb

## 2018-07-21 DIAGNOSIS — E039 Hypothyroidism, unspecified: Secondary | ICD-10-CM | POA: Diagnosis not present

## 2018-07-21 DIAGNOSIS — I10 Essential (primary) hypertension: Secondary | ICD-10-CM

## 2018-07-21 DIAGNOSIS — G629 Polyneuropathy, unspecified: Secondary | ICD-10-CM

## 2018-07-21 DIAGNOSIS — C9001 Multiple myeloma in remission: Secondary | ICD-10-CM | POA: Diagnosis not present

## 2018-07-21 MED ORDER — ZOLEDRONIC ACID 4 MG/5ML IV CONC
3.3000 mg | Freq: Once | INTRAVENOUS | Status: AC
  Administered 2018-07-21: 3.3 mg via INTRAVENOUS
  Filled 2018-07-21: qty 4.13

## 2018-07-21 MED ORDER — SODIUM CHLORIDE 0.9 % IV SOLN
INTRAVENOUS | Status: DC
  Administered 2018-07-21: 14:00:00 via INTRAVENOUS
  Filled 2018-07-21: qty 250

## 2018-09-01 ENCOUNTER — Other Ambulatory Visit: Payer: Self-pay

## 2018-09-01 ENCOUNTER — Telehealth: Payer: Self-pay

## 2018-09-01 MED ORDER — METRONIDAZOLE 375 MG PO CAPS: 375.0000 mg | ORAL_CAPSULE | Freq: Two times a day (BID) | ORAL | 0 refills | Status: DC

## 2018-09-01 MED ORDER — METRONIDAZOLE 250 MG PO TABS: 250.0000 mg | ORAL_TABLET | Freq: Two times a day (BID) | ORAL | 0 refills | Status: DC

## 2018-09-01 NOTE — Telephone Encounter (Signed)
Phar called that they don't have flagyl 375 as per dr Humphrey Rolls called in flagyl 250 mg 1 tab po twice daily for 3 days

## 2018-09-01 NOTE — Telephone Encounter (Signed)
spoke with Rhonda [name] having bad  diarrhea and gas since thursday she tried immodium is not helping as per dr Humphrey Rolls send flagyl 375mg  1 tab po bid for 3 day s and make follow up for thursday

## 2018-09-04 ENCOUNTER — Other Ambulatory Visit: Payer: Self-pay

## 2018-09-04 ENCOUNTER — Encounter: Payer: Self-pay | Admitting: Nurse Practitioner

## 2018-09-04 ENCOUNTER — Telehealth: Payer: Self-pay | Admitting: Internal Medicine

## 2018-09-04 ENCOUNTER — Ambulatory Visit: Payer: Medicare Other | Admitting: Nurse Practitioner

## 2018-09-04 VITALS — BP 122/53 | HR 72 | Temp 98.4°F | Resp 16 | Ht 62.0 in | Wt 126.6 lb

## 2018-09-04 DIAGNOSIS — A09 Infectious gastroenteritis and colitis, unspecified: Secondary | ICD-10-CM | POA: Diagnosis not present

## 2018-09-04 MED ORDER — METRONIDAZOLE 250 MG PO TABS: 250.0000 mg | ORAL_TABLET | Freq: Two times a day (BID) | ORAL | 0 refills | Status: AC

## 2018-09-04 MED ORDER — DIPHENOXYLATE-ATROPINE 2.5-0.025 MG PO TABS: 1.0000 | ORAL_TABLET | Freq: Four times a day (QID) | ORAL | 0 refills | Status: DC | PRN

## 2018-09-04 NOTE — Telephone Encounter (Signed)
Prior authorization for medication diphenoxylate-atropine 2.5 mg tablets has been approved by insurance , pharmacy notified

## 2018-09-04 NOTE — Progress Notes (Signed)
[facility] [place],  [address]  Internal MEDICINE  Office Visit Note  Patient Name: Rhonda [name]  [id number]  [id number]  Date of Service: 09/04/2018   Pt is here for a sick visit.  Chief Complaint  Patient presents with  . Diarrhea    pt stated that she is having the same symptoms that she had last year when she had a bacteria infecion, stated that her poop and gas has a foul odor, no vomitting or fever, no chills  or bodyaches, pt has not been around anyone sick     The patient has had abdominal cramping, bloating, and diarrhea since last week. Was having three to four losse bowel movements per day. Imodium OTC was not helping. Was started on metronidazole 246m bid for 3 three days. Has already noted improvement in symptoms. Only having one or two loose stools per day. The abdominal cramping has reduced. She denies fever, chills, or body aches.       Current Medication:  Outpatient Encounter Medications as of 09/04/2018  Medication Sig  . aspirin EC 81 MG tablet Take 81 mg by mouth daily.  . Cyanocobalamin (B-12 PO) Take 500 mcg by mouth daily.   .Marland KitchenEPINEPHrine 0.3 mg/0.3 mL IJ SOAJ injection   . fexofenadine (ALLEGRA) 30 MG tablet Take 30 mg by mouth daily.   .Marland Kitchenlevothyroxine (SYNTHROID, LEVOTHROID) 50 MCG tablet Take 1 tablet (50 mcg total) by mouth daily before breakfast.  . metroNIDAZOLE (FLAGYL) 250 MG tablet Take 1 tablet (250 mg total) by mouth 2 (two) times daily for 10 days.  . Multiple Vitamins-Minerals (CENTRUM SILVER PO) Take 1 tablet by mouth daily.  . RESTASIS 0.05 % ophthalmic emulsion Place 1 drop into both eyes daily.   .Marland Kitchentriamcinolone (NASACORT ALLERGY 24HR) 55 MCG/ACT AERO nasal inhaler Place 2 sprays into the nose 2 (two) times daily as needed.   .Marland KitchenUNABLE TO FIND Inject 1 Dose as directed once a week. Allergy injections once a week   . [DISCONTINUED] metroNIDAZOLE (FLAGYL) 250 MG tablet Take 1 tablet (250 mg total) by  mouth 2 (two) times daily for 3 days.  . diphenoxylate-atropine (LOMOTIL) 2.5-0.025 MG tablet Take 1 tablet by mouth 4 (four) times daily as needed for diarrhea or loose stools.   No facility-administered encounter medications on file as of 09/04/2018.       Medical History: Past Medical History:  Diagnosis Date  . Arthritis    hands  . Benign neoplasm of ascending colon   . Cataract   . GERD (gastroesophageal reflux disease)   . Hyperlipemia   . Hypertension 01/31/2015  . Hypothyroidism   . [place]  . [place] 04/18[place]  . Osteoporosis   . Skin cancer    basal cell     Today's Vitals   09/04/18 1053  BP: (!) 122/53  Pulse: 72  Resp: 16  Temp: 98.4 F (36.9 C)  SpO2: 99%  Weight: 126 lb 9.6 oz (57.4 kg)  Height: '5\' 2"'  (1.575 m)   Body mass index is 23.16 kg/m.  Review of Systems  Constitutional: Positive for appetite change, chills and fatigue. Negative for fever and unexpected weight change.  HENT: Negative for congestion, postnasal drip, rhinorrhea, sneezing and sore throat.   Eyes: Negative for redness.  Respiratory: Negative for cough, chest tightness, shortness of breath and wheezing.   Cardiovascular: Negative for chest pain and palpitations.  Gastrointestinal: Positive for abdominal pain,  diarrhea and nausea. Negative for constipation and vomiting.       Intermittent abdominal cramping.   Endocrine: Negative for cold intolerance, heat intolerance, polydipsia and polyuria.  Musculoskeletal: Negative for arthralgias, back pain, joint swelling and neck pain.  Skin: Negative for rash.  Allergic/Immunologic: Negative for environmental allergies.  Neurological: Positive for headaches. Negative for tremors and numbness.  Hematological: Negative for adenopathy. Does not bruise/bleed easily.  Psychiatric/Behavioral: Negative for behavioral problems (Depression), sleep disturbance and suicidal ideas. The patient is  not nervous/anxious.     Physical Exam Vitals signs and nursing note reviewed.  Constitutional:      General: She is not in acute distress.    Appearance: Normal appearance. She is well-developed. She is not diaphoretic.  HENT:     Head: Normocephalic and atraumatic.     Mouth/Throat:     Pharynx: No oropharyngeal exudate.  Eyes:     Conjunctiva/sclera: Conjunctivae normal.     Pupils: Pupils are equal, round, and reactive to light.  Neck:     Musculoskeletal: Normal range of motion and neck supple.     Thyroid: No thyromegaly.     Vascular: No JVD.     Trachea: No tracheal deviation.  Cardiovascular:     Rate and Rhythm: Normal rate and regular rhythm.     Heart sounds: Normal heart sounds. No murmur. No friction rub. No gallop.   Pulmonary:     Effort: Pulmonary effort is normal. No respiratory distress.     Breath sounds: Normal breath sounds. No wheezing or rales.  Chest:     Chest wall: No tenderness.  Abdominal:     General: Bowel sounds are normal.     Palpations: Abdomen is soft.     Tenderness: There is generalized abdominal tenderness.  Musculoskeletal: Normal range of motion.  Lymphadenopathy:     Cervical: No cervical adenopathy.  Skin:    General: Skin is warm and dry.  Neurological:     Mental Status: She is alert and oriented to person, place, and time.     Cranial Nerves: No cranial nerve deficit.  Psychiatric:        Behavior: Behavior normal.        Thought Content: Thought content normal.        Judgment: Judgment normal.    Assessment/Plan: 1. Infectious diarrhea Continue metronidazole 266m twice daily for next 10 days. Rest and encouraged a bland diet. Advance diet as tolerated. Sort term prescription for lomotil provided. May be taken up to four times daily if needed for diarrhea. Increase fluid intake . - metroNIDAZOLE (FLAGYL) 250 MG tablet; Take 1 tablet (250 mg total) by mouth 2 (two) times daily for 10 days.  Dispense: 20 tablet; Refill:  0 - diphenoxylate-atropine (LOMOTIL) 2.5-0.025 MG tablet; Take 1 tablet by mouth 4 (four) times daily as needed for diarrhea or loose stools.  Dispense: 30 tablet; Refill: 0  General Counseling: AMakenaverbalizes understanding of the findings of todays visit and agrees with plan of treatment. I have discussed any further diagnostic evaluation that may be needed or ordered today. We also reviewed her medications today. she has been encouraged to call the office with any questions or concerns that should arise related to todays visit.    Counseling:  The 'BRAT' diet is suggested, then progress to diet as tolerated as symptoms abate. Call if bloody stools, persistent diarrhea, vomiting, fever or abdominal pain.  This patient was seen by HLeretha PolFNP Collaboration with Dr FLatricia Heft  Berna Spare as a part of collaborative care agreement  Meds ordered this encounter  Medications  . metroNIDAZOLE (FLAGYL) 250 MG tablet    Sig: Take 1 tablet (250 mg total) by mouth 2 (two) times daily for 10 days.    Dispense:  20 tablet    Refill:  0    Order Specific Question:   Supervising Provider    Answer:   Lavera Guise [[id number]]  . diphenoxylate-atropine (LOMOTIL) 2.5-0.025 MG tablet    Sig: Take 1 tablet by mouth 4 (four) times daily as needed for diarrhea or loose stools.    Dispense:  30 tablet    Refill:  0    Order Specific Question:   Supervising Provider    Answer:   Lavera Guise [[id number]]    Time spent: 15 Minutes

## 2018-10-17 ENCOUNTER — Other Ambulatory Visit: Payer: Self-pay | Admitting: Oncology

## 2018-10-17 ENCOUNTER — Other Ambulatory Visit: Payer: Self-pay

## 2018-10-17 ENCOUNTER — Telehealth: Payer: Self-pay | Admitting: *Deleted

## 2018-10-17 DIAGNOSIS — C9001 Multiple myeloma in remission: Secondary | ICD-10-CM

## 2018-10-17 NOTE — Telephone Encounter (Signed)
Yes and yes. Labs orders are in.

## 2018-10-17 NOTE — Telephone Encounter (Signed)
Contacted patient. Patient informed to keep her apts as scheduled. While on the telephone, I screened her for COVID-19. She has not been exposed or tested. Does not have any symptoms.

## 2018-10-17 NOTE — Telephone Encounter (Signed)
Patient left vm on triage. Requesting to know if she should keep her apts for lab only on Monday 5/11 She is scheduled for md/zometa on Wedneday 5/13.  When reviewing her chart, I also didn't see any lab orders in order review.  Please let me know if you would like pt to keep her apts as schedule on Monday/Wednesday. Also enter labs that are needed.

## 2018-10-19 ENCOUNTER — Other Ambulatory Visit: Payer: Self-pay

## 2018-10-20 ENCOUNTER — Other Ambulatory Visit: Payer: Self-pay

## 2018-10-20 ENCOUNTER — Inpatient Hospital Stay: Payer: Medicare Other | Attending: Oncology

## 2018-10-20 DIAGNOSIS — Z85828 Personal history of other malignant neoplasm of skin: Secondary | ICD-10-CM | POA: Insufficient documentation

## 2018-10-20 DIAGNOSIS — E039 Hypothyroidism, unspecified: Secondary | ICD-10-CM | POA: Diagnosis not present

## 2018-10-20 DIAGNOSIS — Z8249 Family history of ischemic heart disease and other diseases of the circulatory system: Secondary | ICD-10-CM | POA: Insufficient documentation

## 2018-10-20 DIAGNOSIS — K219 Gastro-esophageal reflux disease without esophagitis: Secondary | ICD-10-CM | POA: Diagnosis not present

## 2018-10-20 DIAGNOSIS — C9001 Multiple myeloma in remission: Secondary | ICD-10-CM

## 2018-10-20 DIAGNOSIS — Z823 Family history of stroke: Secondary | ICD-10-CM | POA: Diagnosis not present

## 2018-10-20 DIAGNOSIS — G629 Polyneuropathy, unspecified: Secondary | ICD-10-CM | POA: Insufficient documentation

## 2018-10-20 DIAGNOSIS — D649 Anemia, unspecified: Secondary | ICD-10-CM | POA: Insufficient documentation

## 2018-10-20 DIAGNOSIS — I10 Essential (primary) hypertension: Secondary | ICD-10-CM | POA: Insufficient documentation

## 2018-10-20 DIAGNOSIS — M1990 Unspecified osteoarthritis, unspecified site: Secondary | ICD-10-CM | POA: Insufficient documentation

## 2018-10-20 DIAGNOSIS — E785 Hyperlipidemia, unspecified: Secondary | ICD-10-CM | POA: Diagnosis not present

## 2018-10-20 DIAGNOSIS — R208 Other disturbances of skin sensation: Secondary | ICD-10-CM | POA: Diagnosis not present

## 2018-10-20 LAB — CBC WITH DIFFERENTIAL/PLATELET
Abs Immature Granulocytes: 0.05 10*3/uL (ref 0.00–0.07)
Basophils Absolute: 0 10*3/uL (ref 0.0–0.1)
Basophils Relative: 1 %
Eosinophils Absolute: 0 10*3/uL (ref 0.0–0.5)
Eosinophils Relative: 1 %
HCT: 30 % — ABNORMAL LOW (ref 36.0–46.0)
Hemoglobin: 10.5 g/dL — ABNORMAL LOW (ref 12.0–15.0)
Immature Granulocytes: 1 %
Lymphocytes Relative: 24 %
Lymphs Abs: 1.1 10*3/uL (ref 0.7–4.0)
MCH: 33.9 pg (ref 26.0–34.0)
MCHC: 35 g/dL (ref 30.0–36.0)
MCV: 96.8 fL (ref 80.0–100.0)
Monocytes Absolute: 0.6 10*3/uL (ref 0.1–1.0)
Monocytes Relative: 13 %
Neutro Abs: 3 10*3/uL (ref 1.7–7.7)
Neutrophils Relative %: 60 %
Platelets: 170 10*3/uL (ref 150–400)
RBC: 3.1 MIL/uL — ABNORMAL LOW (ref 3.87–5.11)
RDW: 12.9 % (ref 11.5–15.5)
WBC: 4.8 10*3/uL (ref 4.0–10.5)
nRBC: 0 % (ref 0.0–0.2)

## 2018-10-20 LAB — BASIC METABOLIC PANEL
Anion gap: 7 (ref 5–15)
BUN: 21 mg/dL (ref 8–23)
CO2: 23 mmol/L (ref 22–32)
Calcium: 9.4 mg/dL (ref 8.9–10.3)
Chloride: 111 mmol/L (ref 98–111)
Creatinine, Ser: 1.06 mg/dL — ABNORMAL HIGH (ref 0.44–1.00)
GFR calc Af Amer: 57 mL/min — ABNORMAL LOW (ref >60)
GFR calc non Af Amer: 49 mL/min — ABNORMAL LOW (ref >60)
Glucose, Bld: 118 mg/dL — ABNORMAL HIGH (ref 70–99)
Potassium: 3.9 mmol/L (ref 3.5–5.1)
Sodium: 141 mmol/L (ref 135–145)

## 2018-10-21 LAB — PROTEIN ELECTROPHORESIS, SERUM
A/G Ratio: 1.6 (ref 0.7–1.7)
Albumin ELP: 3.4 g/dL (ref 2.9–4.4)
Alpha-1-Globulin: 0.2 g/dL (ref 0.0–0.4)
Alpha-2-Globulin: 0.6 g/dL (ref 0.4–1.0)
Beta Globulin: 0.8 g/dL (ref 0.7–1.3)
Gamma Globulin: 0.6 g/dL (ref 0.4–1.8)
Globulin, Total: 2.1 g/dL — ABNORMAL LOW (ref 2.2–3.9)
M-Spike, %: 0.2 g/dL — ABNORMAL HIGH
Total Protein ELP: 5.5 g/dL — ABNORMAL LOW (ref 6.0–8.5)

## 2018-10-21 LAB — KAPPA/LAMBDA LIGHT CHAINS
Kappa free light chain: 11 mg/L (ref 3.3–19.4)
Kappa, lambda light chain ratio: 0 — ABNORMAL LOW (ref 0.26–1.65)
Lambda free light chains: 3902.6 mg/L — ABNORMAL HIGH (ref 5.7–26.3)

## 2018-10-21 LAB — IGG, IGA, IGM
IgA: 16 mg/dL — ABNORMAL LOW (ref 64–422)
IgG (Immunoglobin G), Serum: 425 mg/dL — ABNORMAL LOW (ref 586–1602)
IgM (Immunoglobulin M), Srm: 9 mg/dL — ABNORMAL LOW (ref 26–217)

## 2018-10-25 NOTE — Progress Notes (Signed)
[place]  Telephone:(336) [phone] Fax:(336) [phone]  ID: [name] OB: Jul 09, 1933  MR#: [id number]  NLG#:[id number]  Patient Care Team: Lavera Guise, MD as PCP - General (Internal Medicine)  CHIEF COMPLAINT:  Multiple myeloma in remission.  INTERVAL HISTORY: Patient returns to clinic today for repeat laboratory work, further evaluation, and continuation of Zometa.  She continues to feel well and remains asymptomatic.  She continues to have a mild peripheral neuropathy is unchanged and does not affect her day-to-day activity.  She has no other neurologic complaints. She denies any recent fevers or illnesses. She has a good appetite and denies weight loss.  She denies any chest pain, shortness of breath, cough, or hemoptysis.  She denies any nausea, vomiting, constipation, or diarrhea.  She has no hematuria, melena, or hematochezia. She has no urinary complaints.  Patient feels at her baseline offers no specific complaints today.  REVIEW OF SYSTEMS:   Review of Systems  Constitutional: Negative.  Negative for fever, malaise/fatigue and weight loss.  HENT: Negative for congestion and sore throat.   Respiratory: Negative.  Negative for cough and shortness of breath.   Cardiovascular: Negative.  Negative for chest pain, palpitations and leg swelling.  Gastrointestinal: Negative.  Negative for abdominal pain, blood in stool, constipation, diarrhea, melena, nausea and vomiting.  Genitourinary: Negative.  Negative for dysuria.  Musculoskeletal: Negative.  Negative for back pain.  Skin: Negative.  Negative for rash.  Neurological: Positive for sensory change. Negative for tingling, focal weakness and weakness.  Psychiatric/Behavioral: Negative.  The patient is not nervous/anxious and does not have insomnia.     As per HPI. Otherwise, a complete review of systems is negative.  PAST MEDICAL HISTORY: Past Medical History:  Diagnosis Date  . Arthritis    hands  .  Benign neoplasm of ascending colon   . Cataract   . GERD (gastroesophageal reflux disease)   . Hyperlipemia   . Hypertension 01/31/2015  . Hypothyroidism   . Multiple myeloma ([place]) 04/18/2016  . Multiple myeloma in remission ([place]) 04/18/2016  . Osteoporosis   . Skin cancer    basal cell    PAST SURGICAL HISTORY: Past Surgical History:  Procedure Laterality Date  . BREAST EXCISIONAL BIOPSY Right 15+ yrs ago   EXCISIONAL - NEG  . CATARACT EXTRACTION W/ INTRAOCULAR LENS IMPLANT    . CHOLECYSTECTOMY N/A 03/14/2017   Procedure: LAPAROSCOPIC CHOLECYSTECTOMY;  Surgeon: Florene Glen, MD;  Location: [facility];  Service: General;  Laterality: N/A;  . COLONOSCOPY WITH PROPOFOL N/A 02/21/2015   Procedure: COLONOSCOPY WITH PROPOFOL;  Surgeon: Lucilla Lame, MD;  Location: [place];  Service: Endoscopy;  Laterality: N/A;  . [place]  . POLYPECTOMY  02/21/2015   Procedure: POLYPECTOMY;  Surgeon: Lucilla Lame, MD;  Location: [place];  Service: Endoscopy;;    FAMILY HISTORY: Family History  Problem Relation Age of Onset  . Heart disease Mother   . Stroke Father   . Heart disease Brother   . Kidney cancer Neg Hx   . Prostate cancer Neg Hx   . Bladder Cancer Neg Hx   . Breast cancer Neg Hx     ADVANCED DIRECTIVES (Y/N):  N  HEALTH MAINTENANCE: Social History   Tobacco Use  . Smoking status: Never Smoker  . Smokeless tobacco: Never Used  Substance Use Topics  . Alcohol use: No    Alcohol/week: 0.0 standard drinks  . Drug use: No     Colonoscopy:  PAP:  Bone density:  Lipid panel:  No Known Allergies  Current Outpatient Medications  Medication Sig Dispense Refill  . aspirin EC 81 MG tablet Take 81 mg by mouth daily.    . Cyanocobalamin (B-12 PO) Take 500 mcg by mouth daily.     . diphenoxylate-atropine (LOMOTIL) 2.5-0.025 MG tablet Take 1 tablet by mouth 4 (four) times daily as needed for diarrhea or loose stools. 30 tablet 0  .  EPINEPHrine 0.3 mg/0.3 mL IJ SOAJ injection     . fexofenadine (ALLEGRA) 30 MG tablet Take 30 mg by mouth daily.     Marland Kitchen levothyroxine (SYNTHROID, LEVOTHROID) 50 MCG tablet Take 1 tablet (50 mcg total) by mouth daily before breakfast. 90 tablet 3  . Multiple Vitamins-Minerals (CENTRUM SILVER PO) Take 1 tablet by mouth daily.    . RESTASIS 0.05 % ophthalmic emulsion Place 1 drop into both eyes daily.     Marland Kitchen triamcinolone (NASACORT ALLERGY 24HR) 55 MCG/ACT AERO nasal inhaler Place 2 sprays into the nose 2 (two) times daily as needed.     Marland Kitchen UNABLE TO FIND Inject 1 Dose as directed once a week. Allergy injections once a week      No current facility-administered medications for this visit.     OBJECTIVE: Vitals:   10/27/18 1308  BP: (!) 116/59  Pulse: 78  Resp: 18     Body mass index is 23.94 kg/m.    ECOG FS:0 - Asymptomatic  General: Well-developed, well-nourished, no acute distress. Eyes: Pink conjunctiva, anicteric sclera. HEENT: Normocephalic, moist mucous membranes. Lungs: Clear to auscultation bilaterally. Heart: Regular rate and rhythm. No rubs, murmurs, or gallops. Abdomen: Soft, nontender, nondistended. No organomegaly noted, normoactive bowel sounds. Musculoskeletal: No edema, cyanosis, or clubbing. Neuro: Alert, answering all questions appropriately. Cranial nerves grossly intact. Skin: No rashes or petechiae noted. Psych: Normal affect.  LAB RESULTS:  Lab Results  Component Value Date   NA 141 10/20/2018   K 3.9 10/20/2018   CL 111 10/20/2018   CO2 23 10/20/2018   GLUCOSE 118 (H) 10/20/2018   BUN 21 10/20/2018   CREATININE 1.06 (H) 10/20/2018   CALCIUM 9.4 10/20/2018   PROT 6.5 03/04/2017   ALBUMIN 4.2 03/04/2017   AST 24 03/04/2017   ALT 16 03/04/2017   ALKPHOS 49 03/04/2017   BILITOT 0.5 03/04/2017   GFRNONAA 49 (L) 10/20/2018   GFRAA 57 (L) 10/20/2018    Lab Results  Component Value Date   WBC 4.8 10/20/2018   NEUTROABS 3.0 10/20/2018   HGB 10.5 (L)  10/20/2018   HCT 30.0 (L) 10/20/2018   MCV 96.8 10/20/2018   PLT 170 10/20/2018   Lab Results  Component Value Date   TOTALPROTELP 5.5 (L) 10/20/2018   ALBUMINELP 3.4 10/20/2018   A1GS 0.2 10/20/2018   A2GS 0.6 10/20/2018   BETS 0.8 10/20/2018   GAMS 0.6 10/20/2018   MSPIKE 0.2 (H) 10/20/2018   SPEI Comment 10/20/2018     STUDIES: No results found.  ASSESSMENT:  Multiple myeloma in remission.  PLAN:    1.  Multiple myeloma in remission: Bone marrow biopsy on April 09, 2016 revealed 44% plasma cells and normal cytogenetics. Given the results of her metastatic bone survey on March 12, 2016, her elevated lambda free chains, and hypercalcemia, patient fit the criteria for multiple myeloma and was treated with Revlimid. Patient's M spike appears to fluctuate between 0.0 and 0.2 and her most recent result from July 14, 2018 revealed an M spike of 0.2.  IgG, IgM, and IgA are all mildly decreased and unchanged.  Patient's lambda free light chains continue to trend up and are now 3902.6.  She also has become slightly more anemic likely indicating progression of disease.  Patient expressed understanding that she will likely have to reinitiate Revlimid in the near future.  It was agreed upon not to restart treatment in the midst of the COVID-19 pandemic.  Patient initiated monthly Zometa on May 07, 2016.  Proceed with 3.3 mg Zometa today.  Return to clinic in 6 weeks for laboratory work only and then in 3 months for further evaluation and continuation of treatment. 2. Hypercalcemia: Resolved.   3.  Anemia: Patient's hemoglobin has trended down to 10.5.  Monitor.   Patient expressed understanding and was in agreement with this plan. She also understands that She can call clinic at any time with any questions, concerns, or complaints.    Lloyd Huger, MD 10/29/18 7:11 AM

## 2018-10-27 ENCOUNTER — Inpatient Hospital Stay (HOSPITAL_BASED_OUTPATIENT_CLINIC_OR_DEPARTMENT_OTHER): Payer: Medicare Other | Admitting: Oncology

## 2018-10-27 ENCOUNTER — Inpatient Hospital Stay: Payer: Medicare Other

## 2018-10-27 ENCOUNTER — Other Ambulatory Visit: Payer: Self-pay

## 2018-10-27 VITALS — BP 116/59 | HR 78 | Resp 18 | Wt 130.9 lb

## 2018-10-27 DIAGNOSIS — M1990 Unspecified osteoarthritis, unspecified site: Secondary | ICD-10-CM | POA: Diagnosis not present

## 2018-10-27 DIAGNOSIS — E039 Hypothyroidism, unspecified: Secondary | ICD-10-CM

## 2018-10-27 DIAGNOSIS — I10 Essential (primary) hypertension: Secondary | ICD-10-CM

## 2018-10-27 DIAGNOSIS — K219 Gastro-esophageal reflux disease without esophagitis: Secondary | ICD-10-CM

## 2018-10-27 DIAGNOSIS — Z823 Family history of stroke: Secondary | ICD-10-CM

## 2018-10-27 DIAGNOSIS — C9001 Multiple myeloma in remission: Secondary | ICD-10-CM

## 2018-10-27 DIAGNOSIS — G629 Polyneuropathy, unspecified: Secondary | ICD-10-CM

## 2018-10-27 DIAGNOSIS — R208 Other disturbances of skin sensation: Secondary | ICD-10-CM

## 2018-10-27 DIAGNOSIS — Z8249 Family history of ischemic heart disease and other diseases of the circulatory system: Secondary | ICD-10-CM

## 2018-10-27 DIAGNOSIS — E785 Hyperlipidemia, unspecified: Secondary | ICD-10-CM

## 2018-10-27 DIAGNOSIS — D649 Anemia, unspecified: Secondary | ICD-10-CM

## 2018-10-27 DIAGNOSIS — Z85828 Personal history of other malignant neoplasm of skin: Secondary | ICD-10-CM

## 2018-10-27 MED ORDER — ZOLEDRONIC ACID 4 MG/100ML IV SOLN
4.0000 mg | Freq: Once | INTRAVENOUS | Status: AC
  Administered 2018-10-27: 4 mg via INTRAVENOUS
  Filled 2018-10-27: qty 100

## 2018-10-27 MED ORDER — SODIUM CHLORIDE 0.9 % IV SOLN
Freq: Once | INTRAVENOUS | Status: AC
  Administered 2018-10-27: 14:00:00 via INTRAVENOUS
  Filled 2018-10-27: qty 250

## 2018-10-27 NOTE — Progress Notes (Signed)
Patient denies any concerns today.  

## 2018-12-05 ENCOUNTER — Other Ambulatory Visit: Payer: Self-pay

## 2018-12-08 ENCOUNTER — Inpatient Hospital Stay: Payer: Medicare Other | Attending: Oncology

## 2018-12-08 ENCOUNTER — Other Ambulatory Visit: Payer: Self-pay

## 2018-12-08 DIAGNOSIS — Z823 Family history of stroke: Secondary | ICD-10-CM | POA: Diagnosis not present

## 2018-12-08 DIAGNOSIS — I10 Essential (primary) hypertension: Secondary | ICD-10-CM | POA: Diagnosis not present

## 2018-12-08 DIAGNOSIS — R208 Other disturbances of skin sensation: Secondary | ICD-10-CM | POA: Insufficient documentation

## 2018-12-08 DIAGNOSIS — E785 Hyperlipidemia, unspecified: Secondary | ICD-10-CM | POA: Diagnosis not present

## 2018-12-08 DIAGNOSIS — Z8249 Family history of ischemic heart disease and other diseases of the circulatory system: Secondary | ICD-10-CM | POA: Insufficient documentation

## 2018-12-08 DIAGNOSIS — C9001 Multiple myeloma in remission: Secondary | ICD-10-CM | POA: Diagnosis present

## 2018-12-08 DIAGNOSIS — K219 Gastro-esophageal reflux disease without esophagitis: Secondary | ICD-10-CM | POA: Insufficient documentation

## 2018-12-08 DIAGNOSIS — E039 Hypothyroidism, unspecified: Secondary | ICD-10-CM | POA: Diagnosis not present

## 2018-12-08 DIAGNOSIS — D649 Anemia, unspecified: Secondary | ICD-10-CM | POA: Insufficient documentation

## 2018-12-08 LAB — CBC WITH DIFFERENTIAL/PLATELET
Abs Immature Granulocytes: 0.08 10*3/uL — ABNORMAL HIGH (ref 0.00–0.07)
Basophils Absolute: 0.1 10*3/uL (ref 0.0–0.1)
Basophils Relative: 1 %
Eosinophils Absolute: 0 10*3/uL (ref 0.0–0.5)
Eosinophils Relative: 1 %
HCT: 32.1 % — ABNORMAL LOW (ref 36.0–46.0)
Hemoglobin: 11.1 g/dL — ABNORMAL LOW (ref 12.0–15.0)
Immature Granulocytes: 1 %
Lymphocytes Relative: 26 %
Lymphs Abs: 1.6 10*3/uL (ref 0.7–4.0)
MCH: 32.9 pg (ref 26.0–34.0)
MCHC: 34.6 g/dL (ref 30.0–36.0)
MCV: 95.3 fL (ref 80.0–100.0)
Monocytes Absolute: 0.9 10*3/uL (ref 0.1–1.0)
Monocytes Relative: 15 %
Neutro Abs: 3.5 10*3/uL (ref 1.7–7.7)
Neutrophils Relative %: 56 %
Platelets: 182 10*3/uL (ref 150–400)
RBC: 3.37 MIL/uL — ABNORMAL LOW (ref 3.87–5.11)
RDW: 12.9 % (ref 11.5–15.5)
WBC: 6.2 10*3/uL (ref 4.0–10.5)
nRBC: 0 % (ref 0.0–0.2)

## 2018-12-08 LAB — BASIC METABOLIC PANEL
Anion gap: 9 (ref 5–15)
BUN: 23 mg/dL (ref 8–23)
CO2: 20 mmol/L — ABNORMAL LOW (ref 22–32)
Calcium: 10.8 mg/dL — ABNORMAL HIGH (ref 8.9–10.3)
Chloride: 108 mmol/L (ref 98–111)
Creatinine, Ser: 0.82 mg/dL (ref 0.44–1.00)
GFR calc Af Amer: >60 mL/min (ref >60)
GFR calc non Af Amer: >60 mL/min (ref >60)
Glucose, Bld: 92 mg/dL (ref 70–99)
Potassium: 3.8 mmol/L (ref 3.5–5.1)
Sodium: 137 mmol/L (ref 135–145)

## 2018-12-09 LAB — PROTEIN ELECTROPHORESIS, SERUM
A/G Ratio: 2.2 — ABNORMAL HIGH (ref 0.7–1.7)
Albumin ELP: 4.2 g/dL (ref 2.9–4.4)
Alpha-1-Globulin: 0.2 g/dL (ref 0.0–0.4)
Alpha-2-Globulin: 0.6 g/dL (ref 0.4–1.0)
Beta Globulin: 0.7 g/dL (ref 0.7–1.3)
Gamma Globulin: 0.5 g/dL (ref 0.4–1.8)
Globulin, Total: 1.9 g/dL — ABNORMAL LOW (ref 2.2–3.9)
M-Spike, %: 0.3 g/dL — ABNORMAL HIGH
Total Protein ELP: 6.1 g/dL (ref 6.0–8.5)

## 2018-12-09 LAB — IGG, IGA, IGM
IgA: 18 mg/dL — ABNORMAL LOW (ref 64–422)
IgG (Immunoglobin G), Serum: 496 mg/dL — ABNORMAL LOW (ref 586–1602)
IgM (Immunoglobulin M), Srm: 10 mg/dL — ABNORMAL LOW (ref 26–217)

## 2018-12-09 LAB — KAPPA/LAMBDA LIGHT CHAINS
Kappa free light chain: 10.2 mg/L (ref 3.3–19.4)
Kappa, lambda light chain ratio: 0 — ABNORMAL LOW (ref 0.26–1.65)
Lambda free light chains: 4033.1 mg/L — ABNORMAL HIGH (ref 5.7–26.3)

## 2019-01-20 ENCOUNTER — Other Ambulatory Visit: Payer: Self-pay

## 2019-01-20 ENCOUNTER — Inpatient Hospital Stay: Payer: Medicare Other | Attending: Oncology

## 2019-01-20 DIAGNOSIS — E039 Hypothyroidism, unspecified: Secondary | ICD-10-CM | POA: Diagnosis not present

## 2019-01-20 DIAGNOSIS — Z85828 Personal history of other malignant neoplasm of skin: Secondary | ICD-10-CM | POA: Diagnosis not present

## 2019-01-20 DIAGNOSIS — C9001 Multiple myeloma in remission: Secondary | ICD-10-CM | POA: Insufficient documentation

## 2019-01-20 DIAGNOSIS — R531 Weakness: Secondary | ICD-10-CM | POA: Insufficient documentation

## 2019-01-20 DIAGNOSIS — R5383 Other fatigue: Secondary | ICD-10-CM | POA: Diagnosis not present

## 2019-01-20 DIAGNOSIS — G629 Polyneuropathy, unspecified: Secondary | ICD-10-CM | POA: Insufficient documentation

## 2019-01-20 DIAGNOSIS — D649 Anemia, unspecified: Secondary | ICD-10-CM | POA: Diagnosis not present

## 2019-01-20 DIAGNOSIS — E785 Hyperlipidemia, unspecified: Secondary | ICD-10-CM | POA: Insufficient documentation

## 2019-01-20 DIAGNOSIS — K219 Gastro-esophageal reflux disease without esophagitis: Secondary | ICD-10-CM | POA: Insufficient documentation

## 2019-01-20 DIAGNOSIS — Z7982 Long term (current) use of aspirin: Secondary | ICD-10-CM | POA: Insufficient documentation

## 2019-01-20 DIAGNOSIS — Z8249 Family history of ischemic heart disease and other diseases of the circulatory system: Secondary | ICD-10-CM | POA: Diagnosis not present

## 2019-01-20 LAB — BASIC METABOLIC PANEL
Anion gap: 8 (ref 5–15)
BUN: 16 mg/dL (ref 8–23)
CO2: 24 mmol/L (ref 22–32)
Calcium: 10.4 mg/dL — ABNORMAL HIGH (ref 8.9–10.3)
Chloride: 108 mmol/L (ref 98–111)
Creatinine, Ser: 0.8 mg/dL (ref 0.44–1.00)
GFR calc Af Amer: >60 mL/min (ref >60)
GFR calc non Af Amer: >60 mL/min (ref >60)
Glucose, Bld: 91 mg/dL (ref 70–99)
Potassium: 3.8 mmol/L (ref 3.5–5.1)
Sodium: 140 mmol/L (ref 135–145)

## 2019-01-20 LAB — CBC WITH DIFFERENTIAL/PLATELET
Abs Immature Granulocytes: 0.06 10*3/uL (ref 0.00–0.07)
Basophils Absolute: 0 10*3/uL (ref 0.0–0.1)
Basophils Relative: 1 %
Eosinophils Absolute: 0.1 10*3/uL (ref 0.0–0.5)
Eosinophils Relative: 1 %
HCT: 32.2 % — ABNORMAL LOW (ref 36.0–46.0)
Hemoglobin: 10.8 g/dL — ABNORMAL LOW (ref 12.0–15.0)
Immature Granulocytes: 1 %
Lymphocytes Relative: 29 %
Lymphs Abs: 1.5 10*3/uL (ref 0.7–4.0)
MCH: 32.7 pg (ref 26.0–34.0)
MCHC: 33.5 g/dL (ref 30.0–36.0)
MCV: 97.6 fL (ref 80.0–100.0)
Monocytes Absolute: 0.8 10*3/uL (ref 0.1–1.0)
Monocytes Relative: 15 %
Neutro Abs: 2.9 10*3/uL (ref 1.7–7.7)
Neutrophils Relative %: 53 %
Platelets: 176 10*3/uL (ref 150–400)
RBC: 3.3 MIL/uL — ABNORMAL LOW (ref 3.87–5.11)
RDW: 13.3 % (ref 11.5–15.5)
WBC: 5.4 10*3/uL (ref 4.0–10.5)
nRBC: 0 % (ref 0.0–0.2)

## 2019-01-21 LAB — IGG, IGA, IGM
IgA: 16 mg/dL — ABNORMAL LOW (ref 64–422)
IgG (Immunoglobin G), Serum: 471 mg/dL — ABNORMAL LOW (ref 586–1602)
IgM (Immunoglobulin M), Srm: 8 mg/dL — ABNORMAL LOW (ref 26–217)

## 2019-01-21 LAB — KAPPA/LAMBDA LIGHT CHAINS
Kappa free light chain: 11.6 mg/L (ref 3.3–19.4)
Kappa, lambda light chain ratio: 0 — ABNORMAL LOW (ref 0.26–1.65)
Lambda free light chains: 4329.4 mg/L — ABNORMAL HIGH (ref 5.7–26.3)

## 2019-01-21 LAB — PROTEIN ELECTROPHORESIS, SERUM
A/G Ratio: 1.8 — ABNORMAL HIGH (ref 0.7–1.7)
Albumin ELP: 3.9 g/dL (ref 2.9–4.4)
Alpha-1-Globulin: 0.2 g/dL (ref 0.0–0.4)
Alpha-2-Globulin: 0.6 g/dL (ref 0.4–1.0)
Beta Globulin: 0.8 g/dL (ref 0.7–1.3)
Gamma Globulin: 0.5 g/dL (ref 0.4–1.8)
Globulin, Total: 2.2 g/dL (ref 2.2–3.9)
M-Spike, %: 0.3 g/dL — ABNORMAL HIGH
Total Protein ELP: 6.1 g/dL (ref 6.0–8.5)

## 2019-01-23 NOTE — Progress Notes (Signed)
[place]  [phone]) [phone] [phone]) [phone]  ID: Rhonda [name] OB: 15-Nov-1933  MR#: [id number]  IHK#:[id number]  Patient Care Team: Lavera Guise, MD as PCP - General (Internal Medicine)  CHIEF COMPLAINT:  Multiple myeloma in remission.  INTERVAL HISTORY: Patient returns to clinic today for repeat laboratory work, further evaluation, and continuation of Zometa.  She has noticed increased weakness and fatigue recently.  She also has persistent "bone aches".  She otherwise feels well.  She continues to have a mild peripheral neuropathy is unchanged and does not affect her day-to-day activity.  She has no other neurologic complaints. She denies any recent fevers or illnesses. She has a good appetite and denies weight loss.  She denies any chest [name], shortness of breath, cough, or hemoptysis.  She denies any nausea, vomiting, constipation, or diarrhea.  She has no hematuria, melena, or hematochezia. She has no urinary complaints.  Patient offers no further specific complaints today.  REVIEW OF SYSTEMS:   Review of Systems  Constitutional: Positive for malaise/fatigue. Negative for fever and weight loss.  HENT: Negative for congestion and sore throat.   Respiratory: Negative.  Negative for cough and shortness of breath.   Cardiovascular: Negative.  Negative for chest [name], palpitations and leg swelling.  Gastrointestinal: Negative.  Negative for abdominal [name], blood in stool, constipation, diarrhea, melena, nausea and vomiting.  Genitourinary: Negative.  Negative for dysuria.  Musculoskeletal: Negative.  Negative for back [name].  Skin: Negative.  Negative for rash.  Neurological: Positive for sensory change and weakness. Negative for tingling and focal weakness.  Psychiatric/Behavioral: Negative.  The patient is not nervous/anxious and does not have insomnia.     As per HPI. Otherwise, a complete review of systems is negative.  PAST MEDICAL HISTORY: Past  Medical History:  Diagnosis Date  . Arthritis    hands  . Benign neoplasm of ascending colon   . Cataract   . GERD (gastroesophageal reflux disease)   . Hyperlipemia   . Hypertension 01/31/2015  . Hypothyroidism   . Multiple myeloma ([place]) 04/18/2016  . Multiple myeloma in remission ([place]) 04/18/2016  . Osteoporosis   . Skin cancer    basal cell    PAST SURGICAL HISTORY: Past Surgical History:  Procedure Laterality Date  . BREAST EXCISIONAL BIOPSY Right 15+ yrs ago   EXCISIONAL - NEG  . CATARACT EXTRACTION W/ INTRAOCULAR LENS IMPLANT    . CHOLECYSTECTOMY N/A 03/14/2017   Procedure: LAPAROSCOPIC CHOLECYSTECTOMY;  Surgeon: Florene Glen, MD;  Location: [facility];  Service: General;  Laterality: N/A;  . COLONOSCOPY WITH PROPOFOL N/A 02/21/2015   Procedure: COLONOSCOPY WITH PROPOFOL;  Surgeon: Lucilla Lame, MD;  Location: [place];  Service: Endoscopy;  Laterality: N/A;  . [place]  . POLYPECTOMY  02/21/2015   Procedure: POLYPECTOMY;  Surgeon: Lucilla Lame, MD;  Location: [place];  Service: Endoscopy;;    FAMILY HISTORY: Family History  Problem Relation Age of Onset  . Heart disease Mother   . Stroke Father   . Heart disease Brother   . Kidney cancer Neg Hx   . Prostate cancer Neg Hx   . Bladder Cancer Neg Hx   . Breast cancer Neg Hx     ADVANCED DIRECTIVES (Y/N):  N  HEALTH MAINTENANCE: Social History   Tobacco Use  . Smoking status: Never Smoker  . Smokeless tobacco: Never Used  Substance Use Topics  . Alcohol use: No    Alcohol/week: 0.0 standard drinks  .  Drug use: No     Colonoscopy:  PAP:  Bone density:  Lipid panel:  No Known Allergies  Current Outpatient Medications  Medication Sig Dispense Refill  . aspirin EC 81 MG tablet Take 81 mg by mouth daily.    . Cyanocobalamin (B-12 PO) Take 500 mcg by mouth daily.     Marland Kitchen EPINEPHrine 0.3 mg/0.3 mL IJ SOAJ injection     . fexofenadine (ALLEGRA) 30 MG tablet Take  30 mg by mouth daily.     Marland Kitchen levothyroxine (SYNTHROID, LEVOTHROID) 50 MCG tablet Take 1 tablet (50 mcg total) by mouth daily before breakfast. 90 tablet 3  . Multiple Vitamins-Minerals (CENTRUM SILVER PO) Take 1 tablet by mouth daily.    . RESTASIS 0.05 % ophthalmic emulsion Place 1 drop into both eyes daily.     Marland Kitchen triamcinolone (NASACORT ALLERGY 24HR) 55 MCG/ACT AERO nasal inhaler Place 2 sprays into the nose 2 (two) times daily as needed.     Marland Kitchen UNABLE TO FIND Inject 1 Dose as directed once a week. Allergy injections once a week     . diphenoxylate-atropine (LOMOTIL) 2.5-0.025 MG tablet Take 1 tablet by mouth 4 (four) times daily as needed for diarrhea or loose stools. (Patient not taking: Reported on 01/28/2019) 30 tablet 0   No current facility-administered medications for this visit.    Facility-Administered Medications Ordered in Other Visits  Medication Dose Route Frequency Provider Last Rate Last Dose  . 0.9 %  sodium chloride infusion   Intravenous Continuous Lloyd Huger, MD   Stopped at 01/28/19 1220    OBJECTIVE: Vitals:   01/28/19 1100  BP: 117/65  Pulse: 75  Resp: 16  Temp: 98.6 F (37 C)     Body mass index is 24.07 kg/m.    ECOG FS:0 - Asymptomatic  General: Well-developed, well-nourished, no acute distress. Eyes: Pink conjunctiva, anicteric sclera. HEENT: Normocephalic, moist mucous membranes. Lungs: Clear to auscultation bilaterally. Heart: Regular rate and rhythm. No rubs, murmurs, or gallops. Abdomen: Soft, nontender, nondistended. No organomegaly noted, normoactive bowel sounds. Musculoskeletal: No edema, cyanosis, or clubbing. Neuro: Alert, answering all questions appropriately. Cranial nerves grossly intact. Skin: No rashes or petechiae noted. Psych: Normal affect.  LAB RESULTS:  Lab Results  Component Value Date   NA 140 01/20/2019   K 3.8 01/20/2019   CL 108 01/20/2019   CO2 24 01/20/2019   GLUCOSE 91 01/20/2019   BUN 16 01/20/2019    CREATININE 0.80 01/20/2019   CALCIUM 10.4 (H) 01/20/2019   PROT 6.5 03/04/2017   ALBUMIN 4.2 03/04/2017   AST 24 03/04/2017   ALT 16 03/04/2017   ALKPHOS 49 03/04/2017   BILITOT 0.5 03/04/2017   GFRNONAA >60 01/20/2019   GFRAA >60 01/20/2019    Lab Results  Component Value Date   WBC 5.4 01/20/2019   NEUTROABS 2.9 01/20/2019   HGB 10.8 (L) 01/20/2019   HCT 32.2 (L) 01/20/2019   MCV 97.6 01/20/2019   PLT 176 01/20/2019   Lab Results  Component Value Date   TOTALPROTELP 6.1 01/20/2019   ALBUMINELP 3.9 01/20/2019   A1GS 0.2 01/20/2019   A2GS 0.6 01/20/2019   BETS 0.8 01/20/2019   GAMS 0.5 01/20/2019   MSPIKE 0.3 (H) 01/20/2019   SPEI Comment 01/20/2019     STUDIES: No results found.  ASSESSMENT:  Multiple myeloma in remission.  PLAN:    1.  Multiple myeloma in remission: Bone marrow biopsy on April 09, 2016 revealed 44% plasma cells and normal cytogenetics.  Given the results of her metastatic bone survey on March 12, 2016, her elevated lambda free chains, and hypercalcemia, patient fit the criteria for multiple myeloma and was treated with Revlimid. Patient's M spike appears to fluctuate between 0.0 and 0.2 with her most recent results from January 20, 2019 at 0.3. IgG, IgM, and IgA are all mildly decreased and unchanged.  Lambda free light chains continue to trend up and are greater than 4300.  Her hemoglobin has declined slightly, but is relatively unchanged recently at 10.8.  She continues to have intermittent hypercalcemia.  Patient expressed understanding that she will likely have to reinitiate Revlimid in the near future. Patient initiated monthly Zometa on May 07, 2016.  Proceed with 3.3 mg Zometa today.  Return to clinic in 6 weeks for repeat laboratory work and further evaluation. 2. Hypercalcemia: Proceed with Zometa as above.  We will also get metastatic bone survey for further evaluation. 3.  Bony aches: Metastatic bone survey as above. 4.  Anemia:  Patient's hemoglobin is decreased, but stable at 10.8.   Patient expressed understanding and was in agreement with this plan. She also understands that She can call clinic at any time with any questions, concerns, or complaints.    Lloyd Huger, MD 01/28/19 2:44 PM

## 2019-01-28 ENCOUNTER — Inpatient Hospital Stay: Payer: Medicare Other

## 2019-01-28 ENCOUNTER — Encounter: Payer: Self-pay | Admitting: Oncology

## 2019-01-28 ENCOUNTER — Inpatient Hospital Stay (HOSPITAL_BASED_OUTPATIENT_CLINIC_OR_DEPARTMENT_OTHER): Payer: Medicare Other | Admitting: Oncology

## 2019-01-28 ENCOUNTER — Other Ambulatory Visit: Payer: Self-pay

## 2019-01-28 VITALS — BP 117/65 | HR 75 | Temp 98.6°F | Resp 16 | Wt 131.6 lb

## 2019-01-28 DIAGNOSIS — C9001 Multiple myeloma in remission: Secondary | ICD-10-CM

## 2019-01-28 MED ORDER — ZOLEDRONIC ACID 4 MG/5ML IV CONC
3.3000 mg | Freq: Once | INTRAVENOUS | Status: AC
  Administered 2019-01-28: 3.3 mg via INTRAVENOUS
  Filled 2019-01-28: qty 4.13

## 2019-01-28 MED ORDER — SODIUM CHLORIDE 0.9 % IV SOLN
INTRAVENOUS | Status: DC
  Administered 2019-01-28: 12:00:00 via INTRAVENOUS
  Filled 2019-01-28: qty 250

## 2019-01-28 NOTE — Progress Notes (Signed)
Patient does not offer any problems today.  

## 2019-02-12 ENCOUNTER — Ambulatory Visit
Admission: RE | Admit: 2019-02-12 | Discharge: 2019-02-12 | Disposition: A | Discharge status: Home / Self Care | Payer: Medicare Other | Source: Ambulatory Visit | Attending: Oncology | Admitting: Oncology

## 2019-02-12 ENCOUNTER — Other Ambulatory Visit: Payer: Self-pay

## 2019-02-12 DIAGNOSIS — C9001 Multiple myeloma in remission: Secondary | ICD-10-CM | POA: Diagnosis present

## 2019-02-27 ENCOUNTER — Ambulatory Visit: Payer: Self-pay | Admitting: Adult Health

## 2019-03-10 ENCOUNTER — Other Ambulatory Visit: Payer: Self-pay

## 2019-03-10 ENCOUNTER — Encounter: Payer: Self-pay | Admitting: Adult Health

## 2019-03-10 ENCOUNTER — Other Ambulatory Visit: Payer: Self-pay | Admitting: Internal Medicine

## 2019-03-10 ENCOUNTER — Ambulatory Visit (INDEPENDENT_AMBULATORY_CARE_PROVIDER_SITE_OTHER): Payer: Medicare Other | Admitting: Adult Health

## 2019-03-10 VITALS — BP 136/76 | Resp 16 | Ht 62.0 in | Wt 133.0 lb

## 2019-03-10 DIAGNOSIS — E785 Hyperlipidemia, unspecified: Secondary | ICD-10-CM

## 2019-03-10 DIAGNOSIS — K219 Gastro-esophageal reflux disease without esophagitis: Secondary | ICD-10-CM

## 2019-03-10 DIAGNOSIS — Z0001 Encounter for general adult medical examination with abnormal findings: Secondary | ICD-10-CM | POA: Diagnosis not present

## 2019-03-10 DIAGNOSIS — I10 Essential (primary) hypertension: Secondary | ICD-10-CM

## 2019-03-10 DIAGNOSIS — R300 Dysuria: Secondary | ICD-10-CM

## 2019-03-10 DIAGNOSIS — E039 Hypothyroidism, unspecified: Secondary | ICD-10-CM

## 2019-03-10 DIAGNOSIS — Z1231 Encounter for screening mammogram for malignant neoplasm of breast: Secondary | ICD-10-CM

## 2019-03-10 DIAGNOSIS — Z23 Encounter for immunization: Secondary | ICD-10-CM

## 2019-03-10 DIAGNOSIS — C9002 Multiple myeloma in relapse: Secondary | ICD-10-CM | POA: Diagnosis not present

## 2019-03-10 NOTE — Progress Notes (Signed)
[facility] [place], [place] [address]  Internal MEDICINE  Office Visit Note  Patient Name: Rhonda [name]  [id number]  [id number]  Date of Service: 03/10/2019  Chief Complaint  Patient presents with  . Medical Management of Chronic Issues  . Annual Exam    [facility]   . Hypertension  . Hypothyroidism  . Hyperlipidemia  . Osteoporosis     HPI Pt is here for routine health maintenance examination.  She is a well appearing 85 yo female.  She has a history of HTN, hypothyroid, hyperlipidemia and multiple myeloma. She denies any overt complaints at this time.  Her BP is well controlled.  She denies alcohol, tobacco or illicit drug use.   Current Medication: Outpatient Encounter Medications as of 03/10/2019  Medication Sig  . aspirin EC 81 MG tablet Take 81 mg by mouth daily.  . Cyanocobalamin (B-12 PO) Take 500 mcg by mouth daily.   Marland Kitchen EPINEPHrine 0.3 mg/0.3 mL IJ SOAJ injection   . fexofenadine (ALLEGRA) 30 MG tablet Take 30 mg by mouth daily.   Marland Kitchen levothyroxine (SYNTHROID, LEVOTHROID) 50 MCG tablet Take 1 tablet (50 mcg total) by mouth daily before breakfast.  . Multiple Vitamins-Minerals (CENTRUM SILVER PO) Take 1 tablet by mouth daily.  . RESTASIS 0.05 % ophthalmic emulsion Place 1 drop into both eyes daily.   Marland Kitchen triamcinolone (NASACORT ALLERGY 24HR) 55 MCG/ACT AERO nasal inhaler Place 2 sprays into the nose 2 (two) times daily as needed.   Marland Kitchen UNABLE TO FIND Inject 1 Dose as directed once a week. Allergy injections once a week   . [DISCONTINUED] diphenoxylate-atropine (LOMOTIL) 2.5-0.025 MG tablet Take 1 tablet by mouth 4 (four) times daily as needed for diarrhea or loose stools. (Patient not taking: Reported on 01/28/2019)   No facility-administered encounter medications on file as of 03/10/2019.     Surgical History: Past Surgical History:  Procedure Laterality Date  . BREAST EXCISIONAL BIOPSY Right 15+ yrs ago   EXCISIONAL - NEG   . CATARACT EXTRACTION W/ INTRAOCULAR LENS IMPLANT    . CHOLECYSTECTOMY N/A 03/14/2017   Procedure: LAPAROSCOPIC CHOLECYSTECTOMY;  Surgeon: Florene Glen, MD;  Location: [facility];  Service: General;  Laterality: N/A;  . COLONOSCOPY WITH PROPOFOL N/A 02/21/2015   Procedure: COLONOSCOPY WITH PROPOFOL;  Surgeon: Lucilla Lame, MD;  Location: [place];  Service: Endoscopy;  Laterality: N/A;  . [place]  . POLYPECTOMY  02/21/2015   Procedure: POLYPECTOMY;  Surgeon: Lucilla Lame, MD;  Location: [place];  Service: Endoscopy;;    Medical History: Past Medical History:  Diagnosis Date  . Arthritis    hands  . Benign neoplasm of ascending colon   . Cataract   . GERD (gastroesophageal reflux disease)   . Hyperlipemia   . Hypertension 01/31/2015  . Hypothyroidism   . Multiple myeloma ([place]) 04/18/2016  . Multiple myeloma in remission ([place]) 04/18/2016  . Osteoporosis   . Skin cancer    basal cell    Family History: Family History  Problem Relation Age of Onset  . Heart disease Mother   . Stroke Father   . Heart disease Brother   . Kidney cancer Neg Hx   . Prostate cancer Neg Hx   . Bladder Cancer Neg Hx   . Breast cancer Neg Hx       Review of Systems  Constitutional: Negative for chills, fatigue and unexpected weight change.  HENT: Negative for congestion, rhinorrhea, sneezing and  sore throat.   Eyes: Negative for photophobia, pain and redness.  Respiratory: Negative for cough, chest tightness and shortness of breath.   Cardiovascular: Negative for chest pain and palpitations.  Gastrointestinal: Negative for abdominal pain, constipation, diarrhea, nausea and vomiting.  Endocrine: Negative.   Genitourinary: Negative for dysuria and frequency.  Musculoskeletal: Negative for arthralgias, back pain, joint swelling and neck pain.  Skin: Negative for rash.  Allergic/Immunologic: Negative.   Neurological: Negative for tremors and numbness.   Hematological: Negative for adenopathy. Does not bruise/bleed easily.  Psychiatric/Behavioral: Negative for behavioral problems and sleep disturbance. The patient is not nervous/anxious.      Vital Signs: Resp 16   Ht 5' 2" (1.575 m)   Wt 133 lb (60.3 kg)   BMI 24.33 kg/m    Physical Exam Vitals signs and nursing note reviewed. Exam conducted with a chaperone present.  Constitutional:      General: She is not in acute distress.    Appearance: She is well-developed. She is not diaphoretic.  HENT:     Head: Normocephalic and atraumatic.     Mouth/Throat:     Pharynx: No oropharyngeal exudate.  Eyes:     Pupils: Pupils are equal, round, and reactive to light.  Neck:     Musculoskeletal: Normal range of motion and neck supple.     Thyroid: No thyromegaly.     Vascular: No JVD.     Trachea: No tracheal deviation.  Cardiovascular:     Rate and Rhythm: Normal rate and regular rhythm.     Heart sounds: Normal heart sounds. No murmur. No friction rub. No gallop.   Pulmonary:     Effort: Pulmonary effort is normal. No respiratory distress.     Breath sounds: Normal breath sounds. No wheezing or rales.  Chest:     Chest wall: No tenderness.     Breasts:        Right: Normal. No swelling, bleeding, inverted nipple, mass, nipple discharge, skin change or tenderness.        Left: Normal. No swelling, bleeding, inverted nipple, mass, nipple discharge, skin change or tenderness.     Comments: Exam Chaperoned by Dorna Bloom CMA Abdominal:     Palpations: Abdomen is soft.     Tenderness: There is no abdominal tenderness. There is no guarding.  Musculoskeletal: Normal range of motion.  Lymphadenopathy:     Cervical: No cervical adenopathy.  Skin:    General: Skin is warm and dry.  Neurological:     Mental Status: She is alert and oriented to person, place, and time.     Cranial Nerves: No cranial nerve deficit.  Psychiatric:        Behavior: Behavior normal.        Thought  Content: Thought content normal.        Judgment: Judgment normal.      LABS: Recent Results (from the past 2160 hour(s))  Basic metabolic panel     Status: Abnormal   Collection Time: 01/20/19 11:14 AM  Result Value Ref Range   Sodium 140 135 - 145 mmol/L   Potassium 3.8 3.5 - 5.1 mmol/L   Chloride 108 98 - 111 mmol/L   CO2 24 22 - 32 mmol/L   Glucose, Bld 91 70 - 99 mg/dL   BUN 16 8 - 23 mg/dL   Creatinine, Ser 0.80 0.44 - 1.00 mg/dL   Calcium 10.4 (H) 8.9 - 10.3 mg/dL   GFR calc non Af Amer >60 >60 mL/min  GFR calc Af Amer >60 >60 mL/min   Anion gap 8 5 - 15    Comment: Performed at [facility], [address] [place], [place] [address]  CBC with Differential/Platelet     Status: Abnormal   Collection Time: 01/20/19 11:14 AM  Result Value Ref Range   WBC 5.4 4.0 - 10.5 K/uL   RBC 3.30 (L) 3.87 - 5.11 MIL/uL   Hemoglobin 10.8 (L) 12.0 - 15.0 g/dL   HCT 32.2 (L) 36.0 - 46.0 %   MCV 97.6 80.0 - 100.0 fL   MCH 32.7 26.0 - 34.0 pg   MCHC 33.5 30.0 - 36.0 g/dL   RDW 13.3 11.5 - 15.5 %   Platelets 176 150 - 400 K/uL   nRBC 0.0 0.0 - 0.2 %   Neutrophils Relative % 53 %   Neutro Abs 2.9 1.7 - 7.7 K/uL   Lymphocytes Relative 29 %   Lymphs Abs 1.5 0.7 - 4.0 K/uL   Monocytes Relative 15 %   Monocytes Absolute 0.8 0.1 - 1.0 K/uL   Eosinophils Relative 1 %   Eosinophils Absolute 0.1 0.0 - 0.5 K/uL   Basophils Relative 1 %   Basophils Absolute 0.0 0.0 - 0.1 K/uL   Immature Granulocytes 1 %   Abs Immature Granulocytes 0.06 0.00 - 0.07 K/uL    Comment: Performed at [facility], [address] [place], [place] [address]  Kappa/lambda light chains     Status: Abnormal   Collection Time: 01/20/19 11:14 AM  Result Value Ref Range   Kappa free light chain 11.6 3.3 - 19.4 mg/L   Lamda free light chains 4,329.4 (H) 5.7 - 26.3 mg/L   Kappa, lamda light chain ratio 0.00 (L) 0.26 - 1.65    Comment: (NOTE) Performed At: [facility] [place], [place] [id number] Rush Farmer MD [id number]   IgG, IgA, IgM     Status: Abnormal   Collection Time: 01/20/19 11:14 AM  Result Value Ref Range   IgG (Immunoglobin G), Serum 471 (L) 586 - 1,602 mg/dL   IgA 16 (L) 64 - 422 mg/dL    Comment: Result confirmed on concentration.   IgM (Immunoglobulin M), Srm 8 (L) 26 - 217 mg/dL    Comment: (NOTE) Result confirmed on concentration. Performed At: [facility] [place], [place] [id number] Rush Farmer MD [id number]   Protein electrophoresis, serum     Status: Abnormal   Collection Time: 01/20/19 11:15 AM  Result Value Ref Range   Total Protein ELP 6.1 6.0 - 8.5 g/dL   Albumin ELP 3.9 2.9 - 4.4 g/dL   Alpha-1-Globulin 0.2 0.0 - 0.4 g/dL   Alpha-2-Globulin 0.6 0.4 - 1.0 g/dL   Beta Globulin 0.8 0.7 - 1.3 g/dL   Gamma Globulin 0.5 0.4 - 1.8 g/dL   M-Spike, % 0.3 (H) Not Observed g/dL   SPE Interp. Comment     Comment: (NOTE) The SPE pattern demonstrates a single peak (M-spike) in the gamma region which may represent monoclonal protein. This peak may also be caused by circulating immune complexes, cryoglobulins, C-reactive protein, fibrinogen or hemolysis.  If clinically indicated, the presence of a monoclonal gammopathy may be confirmed by immuno- fixation, as well as an evaluation of the urine for the presence of Bence-Jones protein. Performed At: [facility] [place], [place] [id number] Rush Farmer MD GQ:[id number]    Comment Comment     Comment: (NOTE) Protein electrophoresis scan will follow  via computer, mail, or courier delivery.    Globulin, Total 2.2 2.2 - 3.9 g/dL   A/G Ratio 1.8 (H) 0.7 - 1.7    Assessment/Plan: 1. Encounter for general adult medical examination with abnormal findings Will check thyroid, as most of her labs have been done by oncology.  - Lipid Panel With LDL/HDL Ratio - TSH - T4, free  2. Acquired hypothyroidism Will review  results of blood when available.   3. Multiple myeloma in relapse [facility]) Continue to follow with oncology.   4. Essential hypertension Controlled, continue current meds.  5. Gastroesophageal reflux disease without esophagitis Stable, continue current meds.  6. Hyperlipidemia, unspecified hyperlipidemia type Will get lipid panel and review when available.   7. Flu vaccine need - Flu Vaccine MDCK QUAD PF  8. Dysuria - UA/M w/rflx Culture, Routine  General Counseling: Sacheen verbalizes understanding of the findings of todays visit and agrees with plan of treatment. I have discussed any further diagnostic evaluation that may be needed or ordered today. We also reviewed her medications today. she has been encouraged to call the office with any questions or concerns that should arise related to todays visit.   Orders Placed This Encounter  Procedures  . Flu Vaccine MDCK QUAD PF  . UA/M w/rflx Culture, Routine    No orders of the defined types were placed in this encounter.   Time spent: 35 Minutes   This patient was seen by Orson Gear AGNP-C in Collaboration with Dr Lavera Guise as a part of collaborative care agreement    Kendell Bane AGNP-C Internal Medicine

## 2019-03-11 ENCOUNTER — Other Ambulatory Visit: Payer: Self-pay

## 2019-03-11 ENCOUNTER — Inpatient Hospital Stay: Payer: Medicare Other | Attending: Oncology

## 2019-03-11 DIAGNOSIS — C9001 Multiple myeloma in remission: Secondary | ICD-10-CM | POA: Diagnosis present

## 2019-03-11 LAB — BASIC METABOLIC PANEL
Anion gap: 6 (ref 5–15)
BUN: 21 mg/dL (ref 8–23)
CO2: 21 mmol/L — ABNORMAL LOW (ref 22–32)
Calcium: 10.1 mg/dL (ref 8.9–10.3)
Chloride: 114 mmol/L — ABNORMAL HIGH (ref 98–111)
Creatinine, Ser: 0.93 mg/dL (ref 0.44–1.00)
GFR calc Af Amer: >60 mL/min (ref >60)
GFR calc non Af Amer: 57 mL/min — ABNORMAL LOW (ref >60)
Glucose, Bld: 132 mg/dL — ABNORMAL HIGH (ref 70–99)
Potassium: 3.7 mmol/L (ref 3.5–5.1)
Sodium: 141 mmol/L (ref 135–145)

## 2019-03-11 LAB — CBC WITH DIFFERENTIAL/PLATELET
Abs Immature Granulocytes: 0.05 10*3/uL (ref 0.00–0.07)
Basophils Absolute: 0 10*3/uL (ref 0.0–0.1)
Basophils Relative: 1 %
Eosinophils Absolute: 0.1 10*3/uL (ref 0.0–0.5)
Eosinophils Relative: 1 %
HCT: 29.3 % — ABNORMAL LOW (ref 36.0–46.0)
Hemoglobin: 10.2 g/dL — ABNORMAL LOW (ref 12.0–15.0)
Immature Granulocytes: 1 %
Lymphocytes Relative: 19 %
Lymphs Abs: 1 10*3/uL (ref 0.7–4.0)
MCH: 33.7 pg (ref 26.0–34.0)
MCHC: 34.8 g/dL (ref 30.0–36.0)
MCV: 96.7 fL (ref 80.0–100.0)
Monocytes Absolute: 0.7 10*3/uL (ref 0.1–1.0)
Monocytes Relative: 12 %
Neutro Abs: 3.5 10*3/uL (ref 1.7–7.7)
Neutrophils Relative %: 66 %
Platelets: 157 10*3/uL (ref 150–400)
RBC: 3.03 MIL/uL — ABNORMAL LOW (ref 3.87–5.11)
RDW: 12.8 % (ref 11.5–15.5)
WBC: 5.4 10*3/uL (ref 4.0–10.5)
nRBC: 0 % (ref 0.0–0.2)

## 2019-03-11 LAB — MICROSCOPIC EXAMINATION: Casts: NONE SEEN /lpf

## 2019-03-11 LAB — UA/M W/RFLX CULTURE, ROUTINE
Bilirubin, UA: NEGATIVE
Glucose, UA: NEGATIVE
Ketones, UA: NEGATIVE
Leukocytes,UA: NEGATIVE
Nitrite, UA: NEGATIVE
Specific Gravity, UA: 1.007 (ref 1.005–1.030)
Urobilinogen, Ur: 0.2 mg/dL (ref 0.2–1.0)
pH, UA: 7 (ref 5.0–7.5)

## 2019-03-12 LAB — PROTEIN ELECTROPHORESIS, SERUM
A/G Ratio: 2.2 — ABNORMAL HIGH (ref 0.7–1.7)
Albumin ELP: 3.8 g/dL (ref 2.9–4.4)
Alpha-1-Globulin: 0.1 g/dL (ref 0.0–0.4)
Alpha-2-Globulin: 0.6 g/dL (ref 0.4–1.0)
Beta Globulin: 0.6 g/dL — ABNORMAL LOW (ref 0.7–1.3)
Gamma Globulin: 0.4 g/dL (ref 0.4–1.8)
Globulin, Total: 1.7 g/dL — ABNORMAL LOW (ref 2.2–3.9)
M-Spike, %: 0.1 g/dL — ABNORMAL HIGH
Total Protein ELP: 5.5 g/dL — ABNORMAL LOW (ref 6.0–8.5)

## 2019-03-12 LAB — IGG, IGA, IGM
IgA: 15 mg/dL — ABNORMAL LOW (ref 64–422)
IgG (Immunoglobin G), Serum: 446 mg/dL — ABNORMAL LOW (ref 586–1602)
IgM (Immunoglobulin M), Srm: 7 mg/dL — ABNORMAL LOW (ref 26–217)

## 2019-03-12 LAB — KAPPA/LAMBDA LIGHT CHAINS
Kappa free light chain: 10.2 mg/L (ref 3.3–19.4)
Kappa, lambda light chain ratio: 0 — ABNORMAL LOW (ref 0.26–1.65)
Lambda free light chains: 4291.6 mg/L — ABNORMAL HIGH (ref 5.7–26.3)

## 2019-03-15 NOTE — Progress Notes (Signed)
Rhonda [name]  [phone] [phone]  ID: [name] OB: February 14, 1934  MR#: [id number]  LID#:[id number]  Patient Care Team: Lavera Guise, MD as PCP - General (Internal Medicine)  CHIEF COMPLAINT:  Multiple myeloma in remission.  INTERVAL HISTORY: Patient returns to clinic today for repeat laboratory work, discussion of her imaging results, and continuation of Zometa.  She currently feels well and is asymptomatic.  She does not complain of "bone aches" today.  She does not complain of any weakness or fatigue. She continues to have a mild peripheral neuropathy is unchanged and does not affect her day-to-day activity.  She has no other neurologic complaints. She denies any recent fevers or illnesses. She has a good appetite and denies weight loss.  She denies any chest pain, shortness of breath, cough, or hemoptysis.  She denies any nausea, vomiting, constipation, or diarrhea.  She has no hematuria, melena, or hematochezia. She has no urinary complaints.  Patient offers no further specific complaints today.  REVIEW OF SYSTEMS:   Review of Systems  Constitutional: Negative.  Negative for fever, malaise/fatigue and weight loss.  HENT: Negative for congestion and sore throat.   Respiratory: Negative.  Negative for cough and shortness of breath.   Cardiovascular: Negative.  Negative for chest pain, palpitations and leg swelling.  Gastrointestinal: Negative.  Negative for abdominal pain, blood in stool, constipation, diarrhea, melena, nausea and vomiting.  Genitourinary: Negative.  Negative for dysuria.  Musculoskeletal: Negative.  Negative for back pain.  Skin: Negative.  Negative for rash.  Neurological: Positive for sensory change and weakness. Negative for tingling and focal weakness.  Psychiatric/Behavioral: Negative.  The patient is not nervous/anxious and does not have insomnia.     As per HPI. Otherwise, a complete review of systems is negative.  PAST MEDICAL HISTORY: Past Medical History:  Diagnosis Date  . Arthritis    hands  . Benign neoplasm of ascending colon   . Cataract   . GERD (gastroesophageal reflux disease)   . Hyperlipemia   . Hypertension 01/31/2015  . Hypothyroidism   . Multiple myeloma ([place]) 04/18/2016  . Multiple myeloma in remission ([place]) 04/18/2016  . Osteoporosis   . Skin cancer    basal cell    PAST SURGICAL HISTORY: Past Surgical History:  Procedure Laterality Date  . BREAST EXCISIONAL BIOPSY Right 15+ yrs ago   EXCISIONAL - NEG  . CATARACT EXTRACTION W/ INTRAOCULAR LENS IMPLANT    . CHOLECYSTECTOMY N/A 03/14/2017   Procedure: LAPAROSCOPIC CHOLECYSTECTOMY;  Surgeon: Florene Glen, MD;  Location: [facility];  Service: General;  Laterality: N/A;  . COLONOSCOPY WITH PROPOFOL N/A 02/21/2015   Procedure: COLONOSCOPY WITH PROPOFOL;  Surgeon: Lucilla Lame, MD;  Location: [place];  Service: Endoscopy;  Laterality: N/A;  . [place]  . POLYPECTOMY  02/21/2015   Procedure: POLYPECTOMY;  Surgeon: Lucilla Lame, MD;  Location: [place];  Service: Endoscopy;;    FAMILY HISTORY: Family History  Problem Relation Age of Onset  . Heart disease Mother   . Stroke Father   . Heart disease Brother   . Kidney cancer Neg Hx   . Prostate cancer Neg Hx   . Bladder Cancer Neg Hx   . Breast cancer Neg Hx     ADVANCED DIRECTIVES (Y/N):  N  HEALTH MAINTENANCE: Social History   Tobacco Use  . Smoking status: Never Smoker  . Smokeless tobacco: Never Used  Substance Use Topics  . Alcohol use: No  Alcohol/week: 0.0 standard drinks  . Drug use: No     Colonoscopy:  PAP:  Bone density:  Lipid panel:  No Known Allergies  Current Outpatient Medications  Medication Sig Dispense Refill  . aspirin EC 81 MG tablet Take 81 mg by mouth daily.    . Cyanocobalamin (B-12 PO) Take 500 mcg by mouth daily.     Marland Kitchen EPINEPHrine 0.3 mg/0.3 mL IJ SOAJ injection     . fexofenadine  (ALLEGRA) 30 MG tablet Take 30 mg by mouth daily.     Marland Kitchen levothyroxine (SYNTHROID, LEVOTHROID) 50 MCG tablet Take 1 tablet (50 mcg total) by mouth daily before breakfast. 90 tablet 3  . Multiple Vitamins-Minerals (CENTRUM SILVER PO) Take 1 tablet by mouth daily.    Marland Kitchen triamcinolone (NASACORT ALLERGY 24HR) 55 MCG/ACT AERO nasal inhaler Place 2 sprays into the nose 2 (two) times daily as needed.     Marland Kitchen UNABLE TO FIND Inject 1 Dose as directed once a week. Allergy injections once a week      No current facility-administered medications for this visit.     OBJECTIVE: Vitals:   03/18/19 1051  BP: 121/62  Pulse: 76  Temp: 98.1 F (36.7 C)     Body mass index is 24.07 kg/m.    ECOG FS:0 - Asymptomatic  General: Well-developed, well-nourished, no acute distress. Eyes: Pink conjunctiva, anicteric sclera. HEENT: Normocephalic, moist mucous membranes. Lungs: Clear to auscultation bilaterally. Heart: Regular rate and rhythm. No rubs, murmurs, or gallops. Abdomen: Soft, nontender, nondistended. No organomegaly noted, normoactive bowel sounds. Musculoskeletal: No edema, cyanosis, or clubbing. Neuro: Alert, answering all questions appropriately. Cranial nerves grossly intact. Skin: No rashes or petechiae noted. Psych: Normal affect.  LAB RESULTS:  Lab Results  Component Value Date   NA 141 03/11/2019   K 3.7 03/11/2019   CL 114 (H) 03/11/2019   CO2 21 (L) 03/11/2019   GLUCOSE 132 (H) 03/11/2019   BUN 21 03/11/2019   CREATININE 0.93 03/11/2019   CALCIUM 10.1 03/11/2019   PROT 6.5 03/04/2017   ALBUMIN 4.2 03/04/2017   AST 24 03/04/2017   ALT 16 03/04/2017   ALKPHOS 49 03/04/2017   BILITOT 0.5 03/04/2017   GFRNONAA 57 (L) 03/11/2019   GFRAA >60 03/11/2019    Lab Results  Component Value Date   WBC 5.4 03/11/2019   NEUTROABS 3.5 03/11/2019   HGB 10.2 (L) 03/11/2019   HCT 29.3 (L) 03/11/2019   MCV 96.7 03/11/2019   PLT 157 03/11/2019   Lab Results  Component Value Date    TOTALPROTELP 5.5 (L) 03/11/2019   ALBUMINELP 3.8 03/11/2019   A1GS 0.1 03/11/2019   A2GS 0.6 03/11/2019   BETS 0.6 (L) 03/11/2019   GAMS 0.4 03/11/2019   MSPIKE 0.1 (H) 03/11/2019   SPEI Comment 03/11/2019     STUDIES: No results found.  ASSESSMENT:  Multiple myeloma in remission.  PLAN:    1.  Multiple myeloma in remission: Bone marrow biopsy on April 09, 2016 revealed 44% plasma cells and normal cytogenetics. Given the results of her metastatic bone survey on March 12, 2016, her elevated lambda free chains, and hypercalcemia, patient fit the criteria for multiple myeloma and was treated with Revlimid. Patient's M spike appears to fluctuate between 0.0 and 0.2 with her most recent results on March 11, 2019 reported at 0.1. IgG, IgM, and IgA are all mildly decreased and unchanged.  Lambda free light change are essentially unchanged at 4291.6.  Her most recent metastatic bone survey did  reveal new small lesions in her calvarium.  Hemoglobin continues to slowly decline and is now 10.2.  Calcium levels are within normal limits.  Although patient has new lesions in her calvarium, it was agreed upon that no treatment is necessary currently but patient will require reinitiation of Revlimid in the near future.  Patient initiated monthly Zometa on May 07, 2016.  Proceed with 3.3 mg Zometa today.  Return to clinic in 4 weeks for laboratory work and Zometa only and then in 8 weeks for laboratory work, further evaluation, and continuation of treatment. 2. Hypercalcemia: Resolved today.  Patient noted to have intermittent hypercalcemia.  Proceed with Zometa as above. 3.  Anemia: Patient's hemoglobin is trended down to 10.2.  Monitor and consider retreatment in the near future. 4.  Peripheral neuropathy: Patient was given a referral for acupuncture.  Patient expressed understanding and was in agreement with this plan. She also understands that She can call clinic at any time with any questions,  concerns, or complaints.    Lloyd Huger, MD 03/21/19 11:08 AM

## 2019-03-18 ENCOUNTER — Inpatient Hospital Stay: Payer: Medicare Other

## 2019-03-18 ENCOUNTER — Inpatient Hospital Stay: Payer: Medicare Other | Attending: Oncology | Admitting: Oncology

## 2019-03-18 ENCOUNTER — Encounter: Payer: Self-pay | Admitting: Oncology

## 2019-03-18 ENCOUNTER — Other Ambulatory Visit: Payer: Self-pay

## 2019-03-18 VITALS — BP 121/62 | HR 76 | Temp 98.1°F | Ht 62.0 in | Wt 131.6 lb

## 2019-03-18 DIAGNOSIS — K219 Gastro-esophageal reflux disease without esophagitis: Secondary | ICD-10-CM | POA: Insufficient documentation

## 2019-03-18 DIAGNOSIS — Z7982 Long term (current) use of aspirin: Secondary | ICD-10-CM | POA: Insufficient documentation

## 2019-03-18 DIAGNOSIS — G629 Polyneuropathy, unspecified: Secondary | ICD-10-CM | POA: Diagnosis not present

## 2019-03-18 DIAGNOSIS — C9001 Multiple myeloma in remission: Secondary | ICD-10-CM | POA: Diagnosis present

## 2019-03-18 DIAGNOSIS — Z79899 Other long term (current) drug therapy: Secondary | ICD-10-CM | POA: Insufficient documentation

## 2019-03-18 DIAGNOSIS — D649 Anemia, unspecified: Secondary | ICD-10-CM | POA: Diagnosis not present

## 2019-03-18 DIAGNOSIS — E039 Hypothyroidism, unspecified: Secondary | ICD-10-CM | POA: Insufficient documentation

## 2019-03-18 DIAGNOSIS — Z85828 Personal history of other malignant neoplasm of skin: Secondary | ICD-10-CM | POA: Insufficient documentation

## 2019-03-18 DIAGNOSIS — E785 Hyperlipidemia, unspecified: Secondary | ICD-10-CM | POA: Insufficient documentation

## 2019-03-18 LAB — LIPID PANEL WITH LDL/HDL RATIO
Cholesterol, Total: 213 mg/dL — ABNORMAL HIGH (ref 100–199)
HDL: 48 mg/dL (ref >39)
LDL Chol Calc (NIH): 143 mg/dL — ABNORMAL HIGH (ref 0–99)
LDL/HDL Ratio: 3 ratio (ref 0.0–3.2)
Triglycerides: 123 mg/dL (ref 0–149)
VLDL Cholesterol Cal: 22 mg/dL (ref 5–40)

## 2019-03-18 LAB — T4, FREE: Free T4: 1.33 ng/dL (ref 0.82–1.77)

## 2019-03-18 LAB — TSH: TSH: 3.11 u[IU]/mL (ref 0.450–4.500)

## 2019-03-18 MED ORDER — ZOLEDRONIC ACID 4 MG/100ML IV SOLN
4.0000 mg | Freq: Once | INTRAVENOUS | Status: AC
  Administered 2019-03-18: 12:00:00 4 mg via INTRAVENOUS
  Filled 2019-03-18: qty 100

## 2019-03-18 MED ORDER — SODIUM CHLORIDE 0.9 % IV SOLN
Freq: Once | INTRAVENOUS | Status: AC
  Administered 2019-03-18: 11:00:00 via INTRAVENOUS
  Filled 2019-03-18: qty 250

## 2019-03-18 NOTE — Progress Notes (Signed)
Patient stated that he had been doing well with no complaints. 

## 2019-03-20 NOTE — Progress Notes (Signed)
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  Left message for patient to set up with preferred physician

## 2019-03-23 ENCOUNTER — Other Ambulatory Visit: Payer: Self-pay | Admitting: Adult Health

## 2019-03-23 DIAGNOSIS — E039 Hypothyroidism, unspecified: Secondary | ICD-10-CM

## 2019-03-23 DIAGNOSIS — R300 Dysuria: Secondary | ICD-10-CM

## 2019-03-23 DIAGNOSIS — Z1239 Encounter for other screening for malignant neoplasm of breast: Secondary | ICD-10-CM

## 2019-03-23 DIAGNOSIS — K219 Gastro-esophageal reflux disease without esophagitis: Secondary | ICD-10-CM

## 2019-03-23 DIAGNOSIS — I10 Essential (primary) hypertension: Secondary | ICD-10-CM

## 2019-03-23 DIAGNOSIS — Z0001 Encounter for general adult medical examination with abnormal findings: Secondary | ICD-10-CM

## 2019-03-23 DIAGNOSIS — C9001 Multiple myeloma in remission: Secondary | ICD-10-CM

## 2019-03-31 ENCOUNTER — Ambulatory Visit: Payer: Medicare Other | Admitting: Adult Health

## 2019-03-31 ENCOUNTER — Other Ambulatory Visit: Payer: Self-pay

## 2019-03-31 ENCOUNTER — Encounter: Payer: Self-pay | Admitting: Adult Health

## 2019-03-31 VITALS — BP 119/68 | HR 85 | Temp 97.2°F | Resp 16 | Ht 62.0 in | Wt 130.0 lb

## 2019-03-31 DIAGNOSIS — I10 Essential (primary) hypertension: Secondary | ICD-10-CM | POA: Diagnosis not present

## 2019-03-31 DIAGNOSIS — J019 Acute sinusitis, unspecified: Secondary | ICD-10-CM

## 2019-03-31 DIAGNOSIS — E039 Hypothyroidism, unspecified: Secondary | ICD-10-CM | POA: Diagnosis not present

## 2019-03-31 MED ORDER — AMOXICILLIN-POT CLAVULANATE 875-125 MG PO TABS: 1.0000 | ORAL_TABLET | Freq: Two times a day (BID) | ORAL | 0 refills | Status: DC

## 2019-03-31 NOTE — Progress Notes (Signed)
[facility] [place], [place] [address]  Internal MEDICINE  Office Visit Note  Patient Name: Rhonda [name]  [id number]  [id number]  Date of Service: 03/31/2019  Chief Complaint  Patient presents with  . Follow-up    lab results  . Hypertension  . Hyperlipidemia  . Gastroesophageal Reflux  . Allergies    HPI  Pt is here for follow up on labs.  Overall she is doing well. Her labs are reviewed at this time.  She is complaining today of sinus pain and pressure with post nasal drip.  She also has ear fullness, and pressure.  She is taking allergy medication daily, and using Nasacort twice daily.  She denies fever, or other symptoms.      Current Medication: Outpatient Encounter Medications as of 03/31/2019  Medication Sig  . aspirin EC 81 MG tablet Take 81 mg by mouth daily.  . Cyanocobalamin (B-12 PO) Take 500 mcg by mouth daily.   Marland Kitchen EPINEPHrine 0.3 mg/0.3 mL IJ SOAJ injection   . fexofenadine (ALLEGRA) 30 MG tablet Take 30 mg by mouth daily.   Marland Kitchen levothyroxine (SYNTHROID) 50 MCG tablet TAKE 1 TABLET BY MOUTH DAILY BEFORE BREAKFAST  . Multiple Vitamins-Minerals (CENTRUM SILVER PO) Take 1 tablet by mouth daily.  Marland Kitchen triamcinolone (NASACORT ALLERGY 24HR) 55 MCG/ACT AERO nasal inhaler Place 2 sprays into the nose 2 (two) times daily as needed.   Marland Kitchen UNABLE TO FIND Inject 1 Dose as directed once a week. Allergy injections once a week   . amoxicillin-clavulanate (AUGMENTIN) 875-125 MG tablet Take 1 tablet by mouth 2 (two) times daily.   No facility-administered encounter medications on file as of 03/31/2019.     Surgical History: Past Surgical History:  Procedure Laterality Date  . BREAST EXCISIONAL BIOPSY Right 15+ yrs ago   EXCISIONAL - NEG  . CATARACT EXTRACTION W/ INTRAOCULAR LENS IMPLANT    . CHOLECYSTECTOMY N/A 03/14/2017   Procedure: LAPAROSCOPIC CHOLECYSTECTOMY;  Surgeon: Florene Glen, MD;  Location: [facility];  Service: General;  Laterality:  N/A;  . COLONOSCOPY WITH PROPOFOL N/A 02/21/2015   Procedure: COLONOSCOPY WITH PROPOFOL;  Surgeon: Lucilla Lame, MD;  Location: [place];  Service: Endoscopy;  Laterality: N/A;  . [place]  . POLYPECTOMY  02/21/2015   Procedure: POLYPECTOMY;  Surgeon: Lucilla Lame, MD;  Location: [place];  Service: Endoscopy;;    Medical History: Past Medical History:  Diagnosis Date  . Arthritis    hands  . Benign neoplasm of ascending colon   . Cataract   . GERD (gastroesophageal reflux disease)   . Hyperlipemia   . Hypertension 01/31/2015  . Hypothyroidism   . Multiple myeloma ([place]) 04/18/2016  . Multiple myeloma in remission ([place]) 04/18/2016  . Osteoporosis   . Skin cancer    basal cell    Family History: Family History  Problem Relation Age of Onset  . Heart disease Mother   . Stroke Father   . Heart disease Brother   . Kidney cancer Neg Hx   . Prostate cancer Neg Hx   . Bladder Cancer Neg Hx   . Breast cancer Neg Hx     Social History   Socioeconomic History  . Marital status: Widowed    Spouse name: Not on file  . Number of children: Not on file  . Years of education: Not on file  . Highest education level: Not on file  Occupational History  . Not on file  Social Needs  . Financial resource strain: Not on file  . Food insecurity    Worry: Not on file    Inability: Not on file  . Transportation needs    Medical: Not on file    Non-medical: Not on file  Tobacco Use  . Smoking status: Never Smoker  . Smokeless tobacco: Never Used  Substance and Sexual Activity  . Alcohol use: No    Alcohol/week: 0.0 standard drinks  . Drug use: No  . Sexual activity: Not on file  Lifestyle  . Physical activity    Days per week: Not on file    Minutes per session: Not on file  . Stress: Not on file  Relationships  . Social Herbalist on phone: Not on file    Gets together: Not on file    Attends religious service: Not on file     Active member of club or organization: Not on file    Attends meetings of clubs or organizations: Not on file    Relationship status: Not on file  . Intimate partner violence    Fear of current or ex partner: Not on file    Emotionally abused: Not on file    Physically abused: Not on file    Forced sexual activity: Not on file  Other Topics Concern  . Not on file  Social History Narrative  . Not on file      Review of Systems  Constitutional: Negative for chills, fatigue and unexpected weight change.  HENT: Positive for ear pain, postnasal drip, rhinorrhea, sinus pressure and sinus pain. Negative for congestion, sneezing and sore throat.   Eyes: Negative for photophobia, pain and redness.  Respiratory: Negative for cough, chest tightness and shortness of breath.   Cardiovascular: Negative for chest pain and palpitations.  Gastrointestinal: Negative for abdominal pain, constipation, diarrhea, nausea and vomiting.  Endocrine: Negative.   Genitourinary: Negative for dysuria and frequency.  Musculoskeletal: Negative for arthralgias, back pain, joint swelling and neck pain.  Skin: Negative for rash.  Allergic/Immunologic: Negative.   Neurological: Negative for tremors and numbness.  Hematological: Negative for adenopathy. Does not bruise/bleed easily.  Psychiatric/Behavioral: Negative for behavioral problems and sleep disturbance. The patient is not nervous/anxious.     Vital Signs: BP 119/68   Pulse 85   Temp (!) 97.2 F (36.2 C)   Resp 16   Ht '5\' 2"'  (1.575 m)   Wt 130 lb (59 kg)   SpO2 98%   BMI 23.78 kg/m    Physical Exam Vitals signs and nursing note reviewed.  Constitutional:      General: She is not in acute distress.    Appearance: She is well-developed. She is not diaphoretic.  HENT:     Head: Normocephalic and atraumatic.     Mouth/Throat:     Pharynx: Posterior oropharyngeal erythema present. No oropharyngeal exudate.  Eyes:     Pupils: Pupils are  equal, round, and reactive to light.  Neck:     Musculoskeletal: Normal range of motion and neck supple.     Thyroid: No thyromegaly.     Vascular: No JVD.     Trachea: No tracheal deviation.  Cardiovascular:     Rate and Rhythm: Normal rate and regular rhythm.     Heart sounds: Normal heart sounds. No murmur. No friction rub. No gallop.   Pulmonary:     Effort: Pulmonary effort is normal. No respiratory distress.     Breath sounds: Normal  breath sounds. No wheezing or rales.  Chest:     Chest wall: No tenderness.  Abdominal:     Palpations: Abdomen is soft.     Tenderness: There is no abdominal tenderness. There is no guarding.  Musculoskeletal: Normal range of motion.  Lymphadenopathy:     Cervical: No cervical adenopathy.  Skin:    General: Skin is warm and dry.  Neurological:     Mental Status: She is alert and oriented to person, place, and time.     Cranial Nerves: No cranial nerve deficit.  Psychiatric:        Behavior: Behavior normal.        Thought Content: Thought content normal.        Judgment: Judgment normal.     Assessment/Plan: 1. Acute non-recurrent sinusitis, unspecified location Advised patient to take entire course of antibiotics as prescribed with food. Pt should return to clinic in 7-10 days if symptoms fail to improve or new symptoms develop.  - amoxicillin-clavulanate (AUGMENTIN) 875-125 MG tablet; Take 1 tablet by mouth 2 (two) times daily.  Dispense: 14 tablet; Refill: 0  2. Acquired hypothyroidism Stable, continue to monitor. And continue synthroid as prescribed.   3. Essential hypertension Well controlled, continue present management.   General Counseling: Ashling verbalizes understanding of the findings of todays visit and agrees with plan of treatment. I have discussed any further diagnostic evaluation that may be needed or ordered today. We also reviewed her medications today. she has been encouraged to call the office with any questions or  concerns that should arise related to todays visit.    No orders of the defined types were placed in this encounter.   Meds ordered this encounter  Medications  . amoxicillin-clavulanate (AUGMENTIN) 875-125 MG tablet    Sig: Take 1 tablet by mouth 2 (two) times daily.    Dispense:  14 tablet    Refill:  0    Time spent: 15 Minutes   This patient was seen by Orson Gear AGNP-C in Collaboration with Dr Lavera Guise as a part of collaborative care agreement     Kendell Bane AGNP-C Internal medicine

## 2019-04-15 ENCOUNTER — Inpatient Hospital Stay: Payer: Medicare Other | Attending: Oncology

## 2019-04-15 ENCOUNTER — Other Ambulatory Visit: Payer: Self-pay

## 2019-04-15 ENCOUNTER — Inpatient Hospital Stay: Payer: Medicare Other

## 2019-04-15 VITALS — BP 103/57 | HR 77 | Resp 18

## 2019-04-15 DIAGNOSIS — C9001 Multiple myeloma in remission: Secondary | ICD-10-CM

## 2019-04-15 LAB — CBC WITH DIFFERENTIAL/PLATELET
Abs Immature Granulocytes: 0.06 10*3/uL (ref 0.00–0.07)
Basophils Absolute: 0 10*3/uL (ref 0.0–0.1)
Basophils Relative: 1 %
Eosinophils Absolute: 0 10*3/uL (ref 0.0–0.5)
Eosinophils Relative: 1 %
HCT: 32.2 % — ABNORMAL LOW (ref 36.0–46.0)
Hemoglobin: 10.8 g/dL — ABNORMAL LOW (ref 12.0–15.0)
Immature Granulocytes: 1 %
Lymphocytes Relative: 24 %
Lymphs Abs: 1.3 10*3/uL (ref 0.7–4.0)
MCH: 32.9 pg (ref 26.0–34.0)
MCHC: 33.5 g/dL (ref 30.0–36.0)
MCV: 98.2 fL (ref 80.0–100.0)
Monocytes Absolute: 0.7 10*3/uL (ref 0.1–1.0)
Monocytes Relative: 13 %
Neutro Abs: 3.3 10*3/uL (ref 1.7–7.7)
Neutrophils Relative %: 60 %
Platelets: 175 10*3/uL (ref 150–400)
RBC: 3.28 MIL/uL — ABNORMAL LOW (ref 3.87–5.11)
RDW: 12.7 % (ref 11.5–15.5)
WBC: 5.4 10*3/uL (ref 4.0–10.5)
nRBC: 0 % (ref 0.0–0.2)

## 2019-04-15 LAB — BASIC METABOLIC PANEL
Anion gap: 7 (ref 5–15)
BUN: 18 mg/dL (ref 8–23)
CO2: 24 mmol/L (ref 22–32)
Calcium: 10.6 mg/dL — ABNORMAL HIGH (ref 8.9–10.3)
Chloride: 111 mmol/L (ref 98–111)
Creatinine, Ser: 0.79 mg/dL (ref 0.44–1.00)
GFR calc Af Amer: >60 mL/min (ref >60)
GFR calc non Af Amer: >60 mL/min (ref >60)
Glucose, Bld: 107 mg/dL — ABNORMAL HIGH (ref 70–99)
Potassium: 3.6 mmol/L (ref 3.5–5.1)
Sodium: 142 mmol/L (ref 135–145)

## 2019-04-15 MED ORDER — ZOLEDRONIC ACID 4 MG/100ML IV SOLN
4.0000 mg | Freq: Once | INTRAVENOUS | Status: AC
  Administered 2019-04-15: 14:00:00 4 mg via INTRAVENOUS
  Filled 2019-04-15: qty 100

## 2019-04-15 MED ORDER — SODIUM CHLORIDE 0.9 % IV SOLN
INTRAVENOUS | Status: DC
  Administered 2019-04-15: 14:00:00 via INTRAVENOUS
  Filled 2019-04-15: qty 250

## 2019-04-16 LAB — PROTEIN ELECTROPHORESIS, SERUM
A/G Ratio: 1.9 — ABNORMAL HIGH (ref 0.7–1.7)
Albumin ELP: 3.7 g/dL (ref 2.9–4.4)
Alpha-1-Globulin: 0.2 g/dL (ref 0.0–0.4)
Alpha-2-Globulin: 0.5 g/dL (ref 0.4–1.0)
Beta Globulin: 0.7 g/dL (ref 0.7–1.3)
Gamma Globulin: 0.5 g/dL (ref 0.4–1.8)
Globulin, Total: 1.9 g/dL — ABNORMAL LOW (ref 2.2–3.9)
M-Spike, %: 0.2 g/dL — ABNORMAL HIGH
Total Protein ELP: 5.6 g/dL — ABNORMAL LOW (ref 6.0–8.5)

## 2019-04-16 LAB — IGG, IGA, IGM
IgA: 15 mg/dL — ABNORMAL LOW (ref 64–422)
IgG (Immunoglobin G), Serum: 441 mg/dL — ABNORMAL LOW (ref 586–1602)
IgM (Immunoglobulin M), Srm: 8 mg/dL — ABNORMAL LOW (ref 26–217)

## 2019-04-16 LAB — KAPPA/LAMBDA LIGHT CHAINS
Kappa free light chain: 10.6 mg/L (ref 3.3–19.4)
Kappa, lambda light chain ratio: 0 — ABNORMAL LOW (ref 0.26–1.65)
Lambda free light chains: 3164.6 mg/L — ABNORMAL HIGH (ref 5.7–26.3)

## 2019-04-17 ENCOUNTER — Ambulatory Visit
Admission: RE | Admit: 2019-04-17 | Discharge: 2019-04-17 | Disposition: A | Discharge status: Home / Self Care | Payer: Medicare Other | Source: Ambulatory Visit | Attending: Internal Medicine | Admitting: Internal Medicine

## 2019-04-17 DIAGNOSIS — Z1231 Encounter for screening mammogram for malignant neoplasm of breast: Secondary | ICD-10-CM | POA: Diagnosis not present

## 2019-05-04 ENCOUNTER — Encounter: Payer: Self-pay | Admitting: Adult Health

## 2019-05-04 ENCOUNTER — Ambulatory Visit: Payer: Medicare Other | Admitting: Adult Health

## 2019-05-04 ENCOUNTER — Other Ambulatory Visit: Payer: Self-pay

## 2019-05-04 VITALS — BP 114/61 | HR 80 | Temp 97.7°F | Resp 16 | Ht 62.0 in | Wt 129.0 lb

## 2019-05-04 DIAGNOSIS — B029 Zoster without complications: Secondary | ICD-10-CM | POA: Diagnosis not present

## 2019-05-04 DIAGNOSIS — I10 Essential (primary) hypertension: Secondary | ICD-10-CM | POA: Diagnosis not present

## 2019-05-04 MED ORDER — VALACYCLOVIR HCL 1 G PO TABS: 1000.0000 mg | ORAL_TABLET | Freq: Two times a day (BID) | ORAL | 0 refills | Status: DC

## 2019-05-04 NOTE — Progress Notes (Signed)
[facility] [place], [place] [address]  Internal MEDICINE  Office Visit Note  Patient Name: Rhonda [name]  [id number]  [id number]  Date of Service: 05/04/2019  Chief Complaint  Patient presents with  . Herpes Zoster    started hurting in the right shoulder last week , red spot under the armpit , pain radiates to arm to the breast area      HPI Pt is here for a sick visit.  She reports 1 week of right shoulder and arm pain.  She has treated this with over-the-counter medication and has not felt much of it.  However she did notice a red rash under her right axilla and noticed that the pain now radiates from this area down her arm and over onto her right breast.  She does have a history of shingles from 11 years ago however at that time it was on her right flank.  She denies any other symptoms or complaints at this time.   Current Medication:  Outpatient Encounter Medications as of 05/04/2019  Medication Sig  . aspirin EC 81 MG tablet Take 81 mg by mouth daily.  . Cyanocobalamin (B-12 PO) Take 500 mcg by mouth daily.   Marland Kitchen EPINEPHrine 0.3 mg/0.3 mL IJ SOAJ injection   . fexofenadine (ALLEGRA) 30 MG tablet Take 30 mg by mouth daily.   Marland Kitchen levothyroxine (SYNTHROID) 50 MCG tablet TAKE 1 TABLET BY MOUTH DAILY BEFORE BREAKFAST  . Multiple Vitamins-Minerals (CENTRUM SILVER PO) Take 1 tablet by mouth daily.  Marland Kitchen triamcinolone (NASACORT ALLERGY 24HR) 55 MCG/ACT AERO nasal inhaler Place 2 sprays into the nose 2 (two) times daily as needed.   Marland Kitchen UNABLE TO FIND Inject 1 Dose as directed once a week. Allergy injections once a week   . valACYclovir (VALTREX) 1000 MG tablet Take 1 tablet (1,000 mg total) by mouth 2 (two) times daily.  . [DISCONTINUED] amoxicillin-clavulanate (AUGMENTIN) 875-125 MG tablet Take 1 tablet by mouth 2 (two) times daily. (Patient not taking: Reported on 05/04/2019)   No facility-administered encounter medications on file as of 05/04/2019.        Medical History: Past Medical History:  Diagnosis Date  . Arthritis    hands  . Benign neoplasm of ascending colon   . Cataract   . GERD (gastroesophageal reflux disease)   . Hyperlipemia   . Hypertension 01/31/2015  . Hypothyroidism   . Multiple myeloma ([place]) 04/18/2016  . Multiple myeloma in remission ([place]) 04/18/2016  . Osteoporosis   . Skin cancer    basal cell     Vital Signs: BP 114/61   Pulse 80   Temp 97.7 F (36.5 C)   Resp 16   Ht 5' 2" (1.575 m)   Wt 129 lb (58.5 kg)   SpO2 98%   BMI 23.59 kg/m    Review of Systems  Constitutional: Negative for chills, fatigue and unexpected weight change.  HENT: Negative for congestion, rhinorrhea, sneezing and sore throat.   Eyes: Negative for photophobia, pain and redness.  Respiratory: Negative for cough, chest tightness and shortness of breath.   Cardiovascular: Negative for chest pain and palpitations.  Gastrointestinal: Negative for abdominal pain, constipation, diarrhea, nausea and vomiting.  Endocrine: Negative.   Genitourinary: Negative for dysuria and frequency.  Musculoskeletal: Negative for arthralgias, back pain, joint swelling and neck pain.       Right arm pain, stiffness  Skin: Positive for rash.       Rash, right axilla.  Allergic/Immunologic:  Negative.   Neurological: Negative for tremors and numbness.  Hematological: Negative for adenopathy. Does not bruise/bleed easily.  Psychiatric/Behavioral: Negative for behavioral problems and sleep disturbance. The patient is not nervous/anxious.     Physical Exam Vitals signs and nursing note reviewed.  Constitutional:      General: She is not in acute distress.    Appearance: She is well-developed. She is not diaphoretic.  HENT:     Head: Normocephalic and atraumatic.     Mouth/Throat:     Pharynx: No oropharyngeal exudate.  Eyes:     Pupils: Pupils are equal, round, and reactive to light.  Neck:     Musculoskeletal: Normal range of motion  and neck supple.     Thyroid: No thyromegaly.     Vascular: No JVD.     Trachea: No tracheal deviation.  Cardiovascular:     Rate and Rhythm: Normal rate and regular rhythm.     Heart sounds: Normal heart sounds. No murmur. No friction rub. No gallop.   Pulmonary:     Effort: Pulmonary effort is normal. No respiratory distress.     Breath sounds: Normal breath sounds. No wheezing or rales.  Chest:     Chest wall: No tenderness.  Abdominal:     Palpations: Abdomen is soft.     Tenderness: There is no abdominal tenderness. There is no guarding.  Musculoskeletal: Normal range of motion.  Lymphadenopathy:     Cervical: No cervical adenopathy.  Skin:    General: Skin is warm and dry.     Comments: Zoster Rash, right axilla  Neurological:     Mental Status: She is alert and oriented to person, place, and time.     Cranial Nerves: No cranial nerve deficit.  Psychiatric:        Behavior: Behavior normal.        Thought Content: Thought content normal.        Judgment: Judgment normal.    Assessment/Plan: 1. Herpes zoster without complication Start Valtrex as discussed.  Offered pain medication at this time, however patient declined.  Encouraged her to call clinic for any changes.  - valACYclovir (VALTREX) 1000 MG tablet; Take 1 tablet (1,000 mg total) by mouth 2 (two) times daily.  Dispense: 20 tablet; Refill: 0  2. Essential hypertension Controlled, continue present management.   General Counseling: Oliviarose verbalizes understanding of the findings of todays visit and agrees with plan of treatment. I have discussed any further diagnostic evaluation that may be needed or ordered today. We also reviewed her medications today. she has been encouraged to call the office with any questions or concerns that should arise related to todays visit.   No orders of the defined types were placed in this encounter.   Meds ordered this encounter  Medications  . valACYclovir (VALTREX) 1000 MG  tablet    Sig: Take 1 tablet (1,000 mg total) by mouth 2 (two) times daily.    Dispense:  20 tablet    Refill:  0    Time spent: 25 Minutes  This patient was seen by Orson Gear AGNP-C in Collaboration with Dr Lavera Guise as a part of collaborative care agreement.  Kendell Bane AGNP-C Internal Medicine

## 2019-05-06 NOTE — Progress Notes (Deleted)
[place]  Telephone:(336) [phone] Fax:(336) [phone]  ID: [name] OB: 1933-09-25  MR#: [id number]  RXV#:[id number]  Patient Care Team: Lavera Guise, MD as PCP - General (Internal Medicine)  CHIEF COMPLAINT:  Multiple myeloma in remission.  INTERVAL HISTORY: Patient returns to clinic today for repeat laboratory work, discussion of her imaging results, and continuation of Zometa.  She currently feels well and is asymptomatic.  She does not complain of "bone aches" today.  She does not complain of any weakness or fatigue. She continues to have a mild peripheral neuropathy is unchanged and does not affect her day-to-day activity.  She has no other neurologic complaints. She denies any recent fevers or illnesses. She has a good appetite and denies weight loss.  She denies any chest pain, shortness of breath, cough, or hemoptysis.  She denies any nausea, vomiting, constipation, or diarrhea.  She has no hematuria, melena, or hematochezia. She has no urinary complaints.  Patient offers no further specific complaints today.  REVIEW OF SYSTEMS:   Review of Systems  Constitutional: Negative.  Negative for fever, malaise/fatigue and weight loss.  HENT: Negative for congestion and sore throat.   Respiratory: Negative.  Negative for cough and shortness of breath.   Cardiovascular: Negative.  Negative for chest pain, palpitations and leg swelling.  Gastrointestinal: Negative.  Negative for abdominal pain, blood in stool, constipation, diarrhea, melena, nausea and vomiting.  Genitourinary: Negative.  Negative for dysuria.  Musculoskeletal: Negative.  Negative for back pain.  Skin: Negative.  Negative for rash.  Neurological: Positive for sensory change and weakness. Negative for tingling and focal weakness.  Psychiatric/Behavioral: Negative.  The patient is not nervous/anxious and does not have insomnia.     As per HPI. Otherwise, a complete review of systems is negative.  PAST MEDICAL HISTORY: Past Medical History:  Diagnosis Date  . Arthritis    hands  . Benign neoplasm of ascending colon   . Cataract   . GERD (gastroesophageal reflux disease)   . Hyperlipemia   . Hypertension 01/31/2015  . Hypothyroidism   . Multiple myeloma ([place]) 04/18/2016  . Multiple myeloma in remission ([place]) 04/18/2016  . Osteoporosis   . Skin cancer    basal cell    PAST SURGICAL HISTORY: Past Surgical History:  Procedure Laterality Date  . BREAST EXCISIONAL BIOPSY Right 15+ yrs ago   EXCISIONAL - NEG  . CATARACT EXTRACTION W/ INTRAOCULAR LENS IMPLANT    . CHOLECYSTECTOMY N/A 03/14/2017   Procedure: LAPAROSCOPIC CHOLECYSTECTOMY;  Surgeon: Florene Glen, MD;  Location: [facility];  Service: General;  Laterality: N/A;  . COLONOSCOPY WITH PROPOFOL N/A 02/21/2015   Procedure: COLONOSCOPY WITH PROPOFOL;  Surgeon: Lucilla Lame, MD;  Location: [place];  Service: Endoscopy;  Laterality: N/A;  . [place]  . POLYPECTOMY  02/21/2015   Procedure: POLYPECTOMY;  Surgeon: Lucilla Lame, MD;  Location: [place];  Service: Endoscopy;;    FAMILY HISTORY: Family History  Problem Relation Age of Onset  . Heart disease Mother   . Stroke Father   . Heart disease Brother   . Kidney cancer Neg Hx   . Prostate cancer Neg Hx   . Bladder Cancer Neg Hx   . Breast cancer Neg Hx     ADVANCED DIRECTIVES (Y/N):  N  HEALTH MAINTENANCE: Social History   Tobacco Use  . Smoking status: Never Smoker  . Smokeless tobacco: Never Used  Substance Use Topics  . Alcohol use: No  Alcohol/week: 0.0 standard drinks  . Drug use: No     Colonoscopy:  PAP:  Bone density:  Lipid panel:  No Known Allergies  Current Outpatient Medications  Medication Sig Dispense Refill  . aspirin EC 81 MG tablet Take 81 mg by mouth daily.    . Cyanocobalamin (B-12 PO) Take 500 mcg by mouth daily.     Marland Kitchen EPINEPHrine 0.3 mg/0.3 mL IJ SOAJ injection     . fexofenadine  (ALLEGRA) 30 MG tablet Take 30 mg by mouth daily.     Marland Kitchen levothyroxine (SYNTHROID) 50 MCG tablet TAKE 1 TABLET BY MOUTH DAILY BEFORE BREAKFAST 90 tablet 3  . Multiple Vitamins-Minerals (CENTRUM SILVER PO) Take 1 tablet by mouth daily.    Marland Kitchen triamcinolone (NASACORT ALLERGY 24HR) 55 MCG/ACT AERO nasal inhaler Place 2 sprays into the nose 2 (two) times daily as needed.     Marland Kitchen UNABLE TO FIND Inject 1 Dose as directed once a week. Allergy injections once a week     . valACYclovir (VALTREX) 1000 MG tablet Take 1 tablet (1,000 mg total) by mouth 2 (two) times daily. 20 tablet 0   No current facility-administered medications for this visit.     OBJECTIVE: There were no vitals filed for this visit.   There is no height or weight on file to calculate BMI.    ECOG FS:0 - Asymptomatic  General: Well-developed, well-nourished, no acute distress. Eyes: Pink conjunctiva, anicteric sclera. HEENT: Normocephalic, moist mucous membranes. Lungs: Clear to auscultation bilaterally. Heart: Regular rate and rhythm. No rubs, murmurs, or gallops. Abdomen: Soft, nontender, nondistended. No organomegaly noted, normoactive bowel sounds. Musculoskeletal: No edema, cyanosis, or clubbing. Neuro: Alert, answering all questions appropriately. Cranial nerves grossly intact. Skin: No rashes or petechiae noted. Psych: Normal affect.  LAB RESULTS:  Lab Results  Component Value Date   NA 142 04/15/2019   K 3.6 04/15/2019   CL 111 04/15/2019   CO2 24 04/15/2019   GLUCOSE 107 (H) 04/15/2019   BUN 18 04/15/2019   CREATININE 0.79 04/15/2019   CALCIUM 10.6 (H) 04/15/2019   PROT 6.5 03/04/2017   ALBUMIN 4.2 03/04/2017   AST 24 03/04/2017   ALT 16 03/04/2017   ALKPHOS 49 03/04/2017   BILITOT 0.5 03/04/2017   GFRNONAA >60 04/15/2019   GFRAA >60 04/15/2019    Lab Results  Component Value Date   WBC 5.4 04/15/2019   NEUTROABS 3.3 04/15/2019   HGB 10.8 (L) 04/15/2019   HCT 32.2 (L) 04/15/2019   MCV 98.2 04/15/2019    PLT 175 04/15/2019   Lab Results  Component Value Date   TOTALPROTELP 5.6 (L) 04/15/2019   ALBUMINELP 3.7 04/15/2019   A1GS 0.2 04/15/2019   A2GS 0.5 04/15/2019   BETS 0.7 04/15/2019   GAMS 0.5 04/15/2019   MSPIKE 0.2 (H) 04/15/2019   SPEI Comment 04/15/2019     STUDIES: Mm 3d Screen Breast Bilateral  Result Date: 04/17/2019 CLINICAL DATA:  Screening. EXAM: DIGITAL SCREENING BILATERAL MAMMOGRAM WITH TOMO AND CAD COMPARISON:  Previous exam(s). ACR Breast Density Category c: The breast tissue is heterogeneously dense, which may obscure small masses. FINDINGS: There are no findings suspicious for malignancy. Images were processed with CAD. IMPRESSION: No mammographic evidence of malignancy. A result letter of this screening mammogram will be mailed directly to the patient. RECOMMENDATION: Screening mammogram in one year. (Code:SM-B-01Y) BI-RADS CATEGORY  1: Negative. Electronically Signed   By: Margarette Canada M.D.   On: 04/17/2019 13:44    ASSESSMENT:  Multiple  myeloma in remission.  PLAN:    1.  Multiple myeloma in remission: Bone marrow biopsy on April 09, 2016 revealed 44% plasma cells and normal cytogenetics. Given the results of her metastatic bone survey on March 12, 2016, her elevated lambda free chains, and hypercalcemia, patient fit the criteria for multiple myeloma and was treated with Revlimid. Patient's M spike appears to fluctuate between 0.0 and 0.2 with her most recent results on March 11, 2019 reported at 0.1. IgG, IgM, and IgA are all mildly decreased and unchanged.  Lambda free light change are essentially unchanged at 4291.6.  Her most recent metastatic bone survey did reveal new small lesions in her calvarium.  Hemoglobin continues to slowly decline and is now 10.2.  Calcium levels are within normal limits.  Although patient has new lesions in her calvarium, it was agreed upon that no treatment is necessary currently but patient will require reinitiation of Revlimid in  the near future.  Patient initiated monthly Zometa on May 07, 2016.  Proceed with 3.3 mg Zometa today.  Return to clinic in 4 weeks for laboratory work and Zometa only and then in 8 weeks for laboratory work, further evaluation, and continuation of treatment. 2. Hypercalcemia: Resolved today.  Patient noted to have intermittent hypercalcemia.  Proceed with Zometa as above. 3.  Anemia: Patient's hemoglobin is trended down to 10.2.  Monitor and consider retreatment in the near future. 4.  Peripheral neuropathy: Patient was given a referral for acupuncture.  Patient expressed understanding and was in agreement with this plan. She also understands that She can call clinic at any time with any questions, concerns, or complaints.    Lloyd Huger, MD 05/06/19 2:40 PM

## 2019-05-11 ENCOUNTER — Telehealth: Payer: Self-pay | Admitting: *Deleted

## 2019-05-11 NOTE — Telephone Encounter (Signed)
Pt has been contacted and r\s.

## 2019-05-11 NOTE — Telephone Encounter (Signed)
Lets push her back one week please.

## 2019-05-11 NOTE — Telephone Encounter (Signed)
Patient called and reports that she has had shingles last weeks and still has 2 more pills to take for her treatment. She has an appointment coming up and is asking if she is alright to keep that appointment on 12/2. Please advise

## 2019-05-13 ENCOUNTER — Ambulatory Visit: Payer: Medicare Other | Admitting: Oncology

## 2019-05-13 ENCOUNTER — Other Ambulatory Visit: Payer: Medicare Other

## 2019-05-13 ENCOUNTER — Ambulatory Visit: Payer: Medicare Other

## 2019-05-17 NOTE — Progress Notes (Signed)
[place]  Telephone:(336) [phone] Fax:(336) [phone]  ID: [name] OB: Feb 19, 1934  MR#: [id number]  [id number]#:[id number]  Patient Care Team: Lavera Guise, MD as PCP - General (Internal Medicine)  CHIEF COMPLAINT:  Multiple myeloma in remission.  INTERVAL HISTORY: Patient returns to clinic today for repeat laboratory work, further evaluation, and continuation of Zometa.  She continues to feel well and remains asymptomatic.  Her peripheral neuropathy is chronic and unchanged.  It does not affect her day-to-day activity.  She has no other neurologic complaints. She denies any recent fevers or illnesses. She has a good appetite and denies weight loss.  She denies any chest pain, shortness of breath, cough, or hemoptysis.  She denies any nausea, vomiting, constipation, or diarrhea.  She has no hematuria, melena, or hematochezia. She has no urinary complaints.  Patient offers no further specific complaints today.  REVIEW OF SYSTEMS:   Review of Systems  Constitutional: Negative.  Negative for fever, malaise/fatigue and weight loss.  HENT: Negative for congestion and sore throat.   Respiratory: Negative.  Negative for cough and shortness of breath.   Cardiovascular: Negative.  Negative for chest pain, palpitations and leg swelling.  Gastrointestinal: Negative.  Negative for abdominal pain, blood in stool, constipation, diarrhea, melena, nausea and vomiting.  Genitourinary: Negative.  Negative for dysuria.  Musculoskeletal: Negative.  Negative for back pain.  Skin: Negative.  Negative for rash.  Neurological: Positive for sensory change and weakness. Negative for tingling and focal weakness.  Psychiatric/Behavioral: Negative.  The patient is not nervous/anxious and does not have insomnia.     As per HPI. Otherwise, a complete review of systems is negative.  PAST MEDICAL HISTORY: Past Medical History:  Diagnosis Date  . Arthritis    hands  . Benign neoplasm of  ascending colon   . Cataract   . GERD (gastroesophageal reflux disease)   . Hyperlipemia   . Hypertension 01/31/2015  . Hypothyroidism   . Multiple myeloma ([place]) 04/18/2016  . Multiple myeloma in remission ([place]) 04/18/2016  . Osteoporosis   . Shingles   . Skin cancer    basal cell    PAST SURGICAL HISTORY: Past Surgical History:  Procedure Laterality Date  . BREAST EXCISIONAL BIOPSY Right 15+ yrs ago   EXCISIONAL - NEG  . CATARACT EXTRACTION W/ INTRAOCULAR LENS IMPLANT    . CHOLECYSTECTOMY N/A 03/14/2017   Procedure: LAPAROSCOPIC CHOLECYSTECTOMY;  Surgeon: Florene Glen, MD;  Location: [facility];  Service: General;  Laterality: N/A;  . COLONOSCOPY WITH PROPOFOL N/A 02/21/2015   Procedure: COLONOSCOPY WITH PROPOFOL;  Surgeon: Lucilla Lame, MD;  Location: [place];  Service: Endoscopy;  Laterality: N/A;  . [place]  . POLYPECTOMY  02/21/2015   Procedure: POLYPECTOMY;  Surgeon: Lucilla Lame, MD;  Location: [place];  Service: Endoscopy;;    FAMILY HISTORY: Family History  Problem Relation Age of Onset  . Heart disease Mother   . Stroke Father   . Heart disease Brother   . Kidney cancer Neg Hx   . Prostate cancer Neg Hx   . Bladder Cancer Neg Hx   . Breast cancer Neg Hx     ADVANCED DIRECTIVES (Y/N):  N  HEALTH MAINTENANCE: Social History   Tobacco Use  . Smoking status: Never Smoker  . Smokeless tobacco: Never Used  Substance Use Topics  . Alcohol use: No    Alcohol/week: 0.0 standard drinks  . Drug use: No     Colonoscopy:  PAP:  Bone density:  Lipid panel:  No Known Allergies  Current Outpatient Medications  Medication Sig Dispense Refill  . aspirin EC 81 MG tablet Take 81 mg by mouth daily.    . Cyanocobalamin (B-12 PO) Take 500 mcg by mouth daily.     Marland Kitchen EPINEPHrine 0.3 mg/0.3 mL IJ SOAJ injection     . fexofenadine (ALLEGRA) 30 MG tablet Take 30 mg by mouth daily.     Marland Kitchen levothyroxine (SYNTHROID) 50 MCG tablet  TAKE 1 TABLET BY MOUTH DAILY BEFORE BREAKFAST 90 tablet 3  . Multiple Vitamins-Minerals (CENTRUM SILVER PO) Take 1 tablet by mouth daily.    Marland Kitchen triamcinolone (NASACORT ALLERGY 24HR) 55 MCG/ACT AERO nasal inhaler Place 2 sprays into the nose 2 (two) times daily as needed.     Marland Kitchen UNABLE TO FIND Inject 1 Dose as directed once a week. Allergy injections once a week     . valACYclovir (VALTREX) 1000 MG tablet Take 1 tablet (1,000 mg total) by mouth 2 (two) times daily. (Patient not taking: Reported on 05/18/2019) 20 tablet 0   No current facility-administered medications for this visit.    Facility-Administered Medications Ordered in Other Visits  Medication Dose Route Frequency Provider Last Rate Last Dose  . 0.9 %  sodium chloride infusion   Intravenous Continuous Lloyd Huger, MD   Stopped at 05/18/19 1113    OBJECTIVE: Vitals:   05/18/19 0939  BP: 128/88  Pulse: 73  Resp: 20  Temp: 98 F (36.7 C)  SpO2: 100%     Body mass index is 23.92 kg/m.    ECOG FS:0 - Asymptomatic  General: Well-developed, well-nourished, no acute distress. Eyes: Pink conjunctiva, anicteric sclera. HEENT: Normocephalic, moist mucous membranes. Lungs: Clear to auscultation bilaterally. Heart: Regular rate and rhythm. No rubs, murmurs, or gallops. Abdomen: Soft, nontender, nondistended. No organomegaly noted, normoactive bowel sounds. Musculoskeletal: No edema, cyanosis, or clubbing. Neuro: Alert, answering all questions appropriately. Cranial nerves grossly intact. Skin: No rashes or petechiae noted. Psych: Normal affect.  LAB RESULTS:  Lab Results  Component Value Date   NA 136 05/18/2019   K 4.4 05/18/2019   CL 110 05/18/2019   CO2 22 05/18/2019   GLUCOSE 94 05/18/2019   BUN 21 05/18/2019   CREATININE 0.98 05/18/2019   CALCIUM 10.9 (H) 05/18/2019   PROT 6.5 03/04/2017   ALBUMIN 4.2 03/04/2017   AST 24 03/04/2017   ALT 16 03/04/2017   ALKPHOS 49 03/04/2017   BILITOT 0.5 03/04/2017    GFRNONAA 53 (L) 05/18/2019   GFRAA >60 05/18/2019    Lab Results  Component Value Date   WBC 5.2 05/18/2019   NEUTROABS 3.0 05/18/2019   HGB 10.9 (L) 05/18/2019   HCT 32.7 (L) 05/18/2019   MCV 99.7 05/18/2019   PLT 172 05/18/2019   Lab Results  Component Value Date   TOTALPROTELP 5.6 (L) 04/15/2019   ALBUMINELP 3.7 04/15/2019   A1GS 0.2 04/15/2019   A2GS 0.5 04/15/2019   BETS 0.7 04/15/2019   GAMS 0.5 04/15/2019   MSPIKE 0.2 (H) 04/15/2019   SPEI Comment 04/15/2019     STUDIES: No results found.  ASSESSMENT:  Multiple myeloma in remission.  PLAN:    1.  Multiple myeloma in remission: Bone marrow biopsy on April 09, 2016 revealed 44% plasma cells and normal cytogenetics. Given the results of her metastatic bone survey on March 12, 2016, her elevated lambda free chains, and hypercalcemia, patient fit the criteria for multiple myeloma and was treated  with Revlimid. Patient's M spike appears to fluctuate between 0.0 and 0.2 with her most recent results on April 15, 2019 reported 0.2. IgG, IgM, and IgA are all mildly decreased and unchanged.  Lambda free light chains have trended down and are now 3164.6.  Metastatic bone survey on February 12, 2019 revealed new small lesions in her calvarium.  Hemoglobin has trended up to 10.9.  She also has hypercalcemia at 10.9.  Although patient has new lesions in her calvarium, it was agreed upon that no treatment is necessary currently but patient will require reinitiation of Revlimid in the near future.  Patient initiated monthly Zometa on May 07, 2016.  Proceed with 4 mg Zometa today.  Return to clinic in 4 weeks for laboratory work and Zometa only and then in 8 weeks for laboratory work, further evaluation, and continuation of treatment.   2. Hypercalcemia: Calcium levels are 10.9 today.  Patient has a history of intermittent hypercalcemia.  Proceed with Zometa as above.   3.  Anemia: Hemoglobin proved to 10.9.  Monitor. 4.   Peripheral neuropathy: Patient was previously given a referral to acupuncture which she said did not help much.  Continue to monitor.    Patient expressed understanding and was in agreement with this plan. She also understands that She can call clinic at any time with any questions, concerns, or complaints.    Lloyd Huger, MD 05/18/19 2:18 PM

## 2019-05-18 ENCOUNTER — Inpatient Hospital Stay: Payer: Medicare Other | Attending: Oncology

## 2019-05-18 ENCOUNTER — Encounter: Payer: Self-pay | Admitting: Oncology

## 2019-05-18 ENCOUNTER — Inpatient Hospital Stay (HOSPITAL_BASED_OUTPATIENT_CLINIC_OR_DEPARTMENT_OTHER): Payer: Medicare Other | Admitting: Oncology

## 2019-05-18 ENCOUNTER — Other Ambulatory Visit: Payer: Self-pay

## 2019-05-18 ENCOUNTER — Inpatient Hospital Stay: Payer: Medicare Other

## 2019-05-18 VITALS — BP 128/88 | HR 73 | Temp 98.0°F | Resp 20 | Wt 130.8 lb

## 2019-05-18 DIAGNOSIS — Z7982 Long term (current) use of aspirin: Secondary | ICD-10-CM | POA: Diagnosis not present

## 2019-05-18 DIAGNOSIS — Z85828 Personal history of other malignant neoplasm of skin: Secondary | ICD-10-CM | POA: Insufficient documentation

## 2019-05-18 DIAGNOSIS — E039 Hypothyroidism, unspecified: Secondary | ICD-10-CM | POA: Diagnosis not present

## 2019-05-18 DIAGNOSIS — C9001 Multiple myeloma in remission: Secondary | ICD-10-CM

## 2019-05-18 DIAGNOSIS — I10 Essential (primary) hypertension: Secondary | ICD-10-CM | POA: Insufficient documentation

## 2019-05-18 DIAGNOSIS — G629 Polyneuropathy, unspecified: Secondary | ICD-10-CM | POA: Insufficient documentation

## 2019-05-18 DIAGNOSIS — Z8249 Family history of ischemic heart disease and other diseases of the circulatory system: Secondary | ICD-10-CM | POA: Diagnosis not present

## 2019-05-18 DIAGNOSIS — D649 Anemia, unspecified: Secondary | ICD-10-CM | POA: Insufficient documentation

## 2019-05-18 DIAGNOSIS — E785 Hyperlipidemia, unspecified: Secondary | ICD-10-CM | POA: Diagnosis not present

## 2019-05-18 DIAGNOSIS — K219 Gastro-esophageal reflux disease without esophagitis: Secondary | ICD-10-CM | POA: Insufficient documentation

## 2019-05-18 LAB — BASIC METABOLIC PANEL
Anion gap: 4 — ABNORMAL LOW (ref 5–15)
BUN: 21 mg/dL (ref 8–23)
CO2: 22 mmol/L (ref 22–32)
Calcium: 10.9 mg/dL — ABNORMAL HIGH (ref 8.9–10.3)
Chloride: 110 mmol/L (ref 98–111)
Creatinine, Ser: 0.98 mg/dL (ref 0.44–1.00)
GFR calc Af Amer: >60 mL/min (ref >60)
GFR calc non Af Amer: 53 mL/min — ABNORMAL LOW (ref >60)
Glucose, Bld: 94 mg/dL (ref 70–99)
Potassium: 4.4 mmol/L (ref 3.5–5.1)
Sodium: 136 mmol/L (ref 135–145)

## 2019-05-18 LAB — CBC WITH DIFFERENTIAL/PLATELET
Abs Immature Granulocytes: 0.05 10*3/uL (ref 0.00–0.07)
Basophils Absolute: 0.1 10*3/uL (ref 0.0–0.1)
Basophils Relative: 1 %
Eosinophils Absolute: 0 10*3/uL (ref 0.0–0.5)
Eosinophils Relative: 1 %
HCT: 32.7 % — ABNORMAL LOW (ref 36.0–46.0)
Hemoglobin: 10.9 g/dL — ABNORMAL LOW (ref 12.0–15.0)
Immature Granulocytes: 1 %
Lymphocytes Relative: 26 %
Lymphs Abs: 1.3 10*3/uL (ref 0.7–4.0)
MCH: 33.2 pg (ref 26.0–34.0)
MCHC: 33.3 g/dL (ref 30.0–36.0)
MCV: 99.7 fL (ref 80.0–100.0)
Monocytes Absolute: 0.7 10*3/uL (ref 0.1–1.0)
Monocytes Relative: 14 %
Neutro Abs: 3 10*3/uL (ref 1.7–7.7)
Neutrophils Relative %: 57 %
Platelets: 172 10*3/uL (ref 150–400)
RBC: 3.28 MIL/uL — ABNORMAL LOW (ref 3.87–5.11)
RDW: 13.7 % (ref 11.5–15.5)
WBC: 5.2 10*3/uL (ref 4.0–10.5)
nRBC: 0 % (ref 0.0–0.2)

## 2019-05-18 MED ORDER — ZOLEDRONIC ACID 4 MG/100ML IV SOLN
4.0000 mg | Freq: Once | INTRAVENOUS | Status: AC
  Administered 2019-05-18: 4 mg via INTRAVENOUS
  Filled 2019-05-18: qty 100

## 2019-05-18 MED ORDER — SODIUM CHLORIDE 0.9 % IV SOLN
INTRAVENOUS | Status: DC
  Administered 2019-05-18: 10:00:00 via INTRAVENOUS
  Filled 2019-05-18: qty 250

## 2019-05-18 NOTE — Progress Notes (Signed)
Patient here for a follow-up with no concerns today.

## 2019-05-19 LAB — IGG, IGA, IGM
IgA: 15 mg/dL — ABNORMAL LOW (ref 64–422)
IgG (Immunoglobin G), Serum: 472 mg/dL — ABNORMAL LOW (ref 586–1602)
IgM (Immunoglobulin M), Srm: 10 mg/dL — ABNORMAL LOW (ref 26–217)

## 2019-05-19 LAB — PROTEIN ELECTROPHORESIS, SERUM
A/G Ratio: 1.8 — ABNORMAL HIGH (ref 0.7–1.7)
Albumin ELP: 3.9 g/dL (ref 2.9–4.4)
Alpha-1-Globulin: 0.2 g/dL (ref 0.0–0.4)
Alpha-2-Globulin: 0.6 g/dL (ref 0.4–1.0)
Beta Globulin: 0.8 g/dL (ref 0.7–1.3)
Gamma Globulin: 0.5 g/dL (ref 0.4–1.8)
Globulin, Total: 2.2 g/dL (ref 2.2–3.9)
M-Spike, %: 0.2 g/dL — ABNORMAL HIGH
Total Protein ELP: 6.1 g/dL (ref 6.0–8.5)

## 2019-05-19 LAB — KAPPA/LAMBDA LIGHT CHAINS
Kappa free light chain: 9.4 mg/L (ref 3.3–19.4)
Kappa, lambda light chain ratio: 0 — ABNORMAL LOW (ref 0.26–1.65)
Lambda free light chains: 4423.1 mg/L — ABNORMAL HIGH (ref 5.7–26.3)

## 2019-06-15 ENCOUNTER — Inpatient Hospital Stay: Payer: Medicare PPO | Attending: Oncology

## 2019-06-15 ENCOUNTER — Inpatient Hospital Stay: Payer: Medicare PPO

## 2019-06-15 ENCOUNTER — Other Ambulatory Visit: Payer: Self-pay

## 2019-06-15 VITALS — BP 125/67 | HR 80 | Temp 98.0°F | Resp 20

## 2019-06-15 DIAGNOSIS — C9001 Multiple myeloma in remission: Secondary | ICD-10-CM | POA: Diagnosis not present

## 2019-06-15 LAB — CBC WITH DIFFERENTIAL/PLATELET
Abs Immature Granulocytes: 0.07 10*3/uL (ref 0.00–0.07)
Basophils Absolute: 0 10*3/uL (ref 0.0–0.1)
Basophils Relative: 1 %
Eosinophils Absolute: 0 10*3/uL (ref 0.0–0.5)
Eosinophils Relative: 0 %
HCT: 33.8 % — ABNORMAL LOW (ref 36.0–46.0)
Hemoglobin: 11.2 g/dL — ABNORMAL LOW (ref 12.0–15.0)
Immature Granulocytes: 1 %
Lymphocytes Relative: 19 %
Lymphs Abs: 1.3 10*3/uL (ref 0.7–4.0)
MCH: 32.7 pg (ref 26.0–34.0)
MCHC: 33.1 g/dL (ref 30.0–36.0)
MCV: 98.8 fL (ref 80.0–100.0)
Monocytes Absolute: 0.9 10*3/uL (ref 0.1–1.0)
Monocytes Relative: 13 %
Neutro Abs: 4.5 10*3/uL (ref 1.7–7.7)
Neutrophils Relative %: 66 %
Platelets: 195 10*3/uL (ref 150–400)
RBC: 3.42 MIL/uL — ABNORMAL LOW (ref 3.87–5.11)
RDW: 13.5 % (ref 11.5–15.5)
WBC: 6.9 10*3/uL (ref 4.0–10.5)
nRBC: 0 % (ref 0.0–0.2)

## 2019-06-15 LAB — BASIC METABOLIC PANEL
Anion gap: 6 (ref 5–15)
BUN: 22 mg/dL (ref 8–23)
CO2: 22 mmol/L (ref 22–32)
Calcium: 11 mg/dL — ABNORMAL HIGH (ref 8.9–10.3)
Chloride: 108 mmol/L (ref 98–111)
Creatinine, Ser: 1.06 mg/dL — ABNORMAL HIGH (ref 0.44–1.00)
GFR calc Af Amer: 56 mL/min — ABNORMAL LOW (ref >60)
GFR calc non Af Amer: 49 mL/min — ABNORMAL LOW (ref >60)
Glucose, Bld: 96 mg/dL (ref 70–99)
Potassium: 4.2 mmol/L (ref 3.5–5.1)
Sodium: 136 mmol/L (ref 135–145)

## 2019-06-15 MED ORDER — ZOLEDRONIC ACID 4 MG/100ML IV SOLN
4.0000 mg | Freq: Once | INTRAVENOUS | Status: AC
  Administered 2019-06-15: 4 mg via INTRAVENOUS
  Filled 2019-06-15: qty 100

## 2019-06-15 MED ORDER — SODIUM CHLORIDE 0.9 % IV SOLN
Freq: Once | INTRAVENOUS | Status: AC
  Filled 2019-06-15: qty 250

## 2019-06-17 LAB — KAPPA/LAMBDA LIGHT CHAINS
Kappa free light chain: 8.7 mg/L (ref 3.3–19.4)
Kappa, lambda light chain ratio: 0 — ABNORMAL LOW (ref 0.26–1.65)
Lambda free light chains: 4668.1 mg/L — ABNORMAL HIGH (ref 5.7–26.3)

## 2019-06-17 LAB — PROTEIN ELECTROPHORESIS, SERUM
A/G Ratio: 2 — ABNORMAL HIGH (ref 0.7–1.7)
Albumin ELP: 4.3 g/dL (ref 2.9–4.4)
Alpha-1-Globulin: 0.2 g/dL (ref 0.0–0.4)
Alpha-2-Globulin: 0.6 g/dL (ref 0.4–1.0)
Beta Globulin: 0.8 g/dL (ref 0.7–1.3)
Gamma Globulin: 0.5 g/dL (ref 0.4–1.8)
Globulin, Total: 2.1 g/dL — ABNORMAL LOW (ref 2.2–3.9)
M-Spike, %: 0.2 g/dL — ABNORMAL HIGH
Total Protein ELP: 6.4 g/dL (ref 6.0–8.5)

## 2019-06-17 LAB — IGG, IGA, IGM
IgA: 16 mg/dL — ABNORMAL LOW (ref 64–422)
IgG (Immunoglobin G), Serum: 490 mg/dL — ABNORMAL LOW (ref 586–1602)
IgM (Immunoglobulin M), Srm: 8 mg/dL — ABNORMAL LOW (ref 26–217)

## 2019-06-29 ENCOUNTER — Encounter: Payer: Self-pay | Admitting: Adult Health

## 2019-06-29 ENCOUNTER — Ambulatory Visit: Payer: Medicare PPO | Admitting: Adult Health

## 2019-06-29 VITALS — Temp 98.7°F | Ht 62.0 in | Wt 127.0 lb

## 2019-06-29 DIAGNOSIS — S0010XA Contusion of unspecified eyelid and periocular area, initial encounter: Secondary | ICD-10-CM

## 2019-06-29 DIAGNOSIS — I10 Essential (primary) hypertension: Secondary | ICD-10-CM

## 2019-06-29 NOTE — Progress Notes (Signed)
[facility] [place], [place] [address]  Internal MEDICINE  Telephone Visit  Patient Name: Rhonda [name]  [id number]  [id number]  Date of Service: 06/29/2019  I connected with the patient at 245 by telephone and verified the patients identity using two identifiers.   I discussed the limitations, risks, security and privacy concerns of performing an evaluation and management service by telephone and the availability of in person appointments. I also discussed with the patient that there may be a patient responsible charge related to the service.  The patient expressed understanding and agrees to proceed.    Chief Complaint  Patient presents with  . Telephone Assessment  . Telephone Screen  . OTHER    pt woke in friday morning black eye and left nose is bruise    HPI  Pt seen via video.  She is seen today for concern over bruising under her left eye.  She reports waking up Friday morning with the black eye.  She does not remember falling, or any trauma.  She reports it has not hurt, she has only had a the bruising.  She denies any visual changes, or headaches.  The only anticoagulant medication she takes is aspirin. It has cleared up a lot of the weekend, and has lightened significantly.    Current Medication: Outpatient Encounter Medications as of 06/29/2019  Medication Sig  . aspirin EC 81 MG tablet Take 81 mg by mouth daily.  . Cyanocobalamin (B-12 PO) Take 500 mcg by mouth daily.   Marland Kitchen EPINEPHrine 0.3 mg/0.3 mL IJ SOAJ injection   . fexofenadine (ALLEGRA) 30 MG tablet Take 30 mg by mouth daily.   Marland Kitchen levothyroxine (SYNTHROID) 50 MCG tablet TAKE 1 TABLET BY MOUTH DAILY BEFORE BREAKFAST  . Multiple Vitamins-Minerals (CENTRUM SILVER PO) Take 1 tablet by mouth daily.  Marland Kitchen triamcinolone (NASACORT ALLERGY 24HR) 55 MCG/ACT AERO nasal inhaler Place 2 sprays into the nose 2 (two) times daily as needed.   Marland Kitchen UNABLE TO FIND Inject 1 Dose as directed once a week. Allergy  injections once a week   . valACYclovir (VALTREX) 1000 MG tablet Take 1 tablet (1,000 mg total) by mouth 2 (two) times daily.   No facility-administered encounter medications on file as of 06/29/2019.    Surgical History: Past Surgical History:  Procedure Laterality Date  . BREAST EXCISIONAL BIOPSY Right 15+ yrs ago   EXCISIONAL - NEG  . CATARACT EXTRACTION W/ INTRAOCULAR LENS IMPLANT    . CHOLECYSTECTOMY N/A 03/14/2017   Procedure: LAPAROSCOPIC CHOLECYSTECTOMY;  Surgeon: Florene Glen, MD;  Location: [facility];  Service: General;  Laterality: N/A;  . COLONOSCOPY WITH PROPOFOL N/A 02/21/2015   Procedure: COLONOSCOPY WITH PROPOFOL;  Surgeon: Lucilla Lame, MD;  Location: [place];  Service: Endoscopy;  Laterality: N/A;  . [place]  . POLYPECTOMY  02/21/2015   Procedure: POLYPECTOMY;  Surgeon: Lucilla Lame, MD;  Location: [place];  Service: Endoscopy;;    Medical History: Past Medical History:  Diagnosis Date  . Arthritis    hands  . Benign neoplasm of ascending colon   . Cataract   . GERD (gastroesophageal reflux disease)   . Hyperlipemia   . Hypertension 01/31/2015  . Hypothyroidism   . Multiple myeloma ([place]) 04/18/2016  . Multiple myeloma in remission ([place]) 04/18/2016  . Osteoporosis   . Shingles   . Skin cancer    basal cell    Family History: Family History  Problem Relation Age of  Onset  . Heart disease Mother   . Stroke Father   . Heart disease Brother   . Kidney cancer Neg Hx   . Prostate cancer Neg Hx   . Bladder Cancer Neg Hx   . Breast cancer Neg Hx     Social History   Socioeconomic History  . Marital status: Widowed    Spouse name: Not on file  . Number of children: Not on file  . Years of education: Not on file  . Highest education level: Not on file  Occupational History  . Not on file  Tobacco Use  . Smoking status: Never Smoker  . Smokeless tobacco: Never Used  Substance and Sexual Activity  .  Alcohol use: No    Alcohol/week: 0.0 standard drinks  . Drug use: No  . Sexual activity: Not on file  Other Topics Concern  . Not on file  Social History Narrative  . Not on file   Social Determinants of Health   Financial Resource Strain:   . Difficulty of Paying Living Expenses: Not on file  Food Insecurity:   . Worried About Charity fundraiser in the Last Year: Not on file  . Ran Out of Food in the Last Year: Not on file  Transportation Needs:   . Lack of Transportation (Medical): Not on file  . Lack of Transportation (Non-Medical): Not on file  Physical Activity:   . Days of Exercise per Week: Not on file  . Minutes of Exercise per Session: Not on file  Stress:   . Feeling of Stress : Not on file  Social Connections:   . Frequency of Communication with Friends and Family: Not on file  . Frequency of Social Gatherings with Friends and Family: Not on file  . Attends Religious Services: Not on file  . Active Member of Clubs or Organizations: Not on file  . Attends Archivist Meetings: Not on file  . Marital Status: Not on file  Intimate Partner Violence:   . Fear of Current or Ex-Partner: Not on file  . Emotionally Abused: Not on file  . Physically Abused: Not on file  . Sexually Abused: Not on file      Review of Systems  Constitutional: Negative for chills, fatigue and unexpected weight change.  HENT: Negative for congestion, rhinorrhea, sneezing and sore throat.   Eyes: Negative for photophobia, pain and redness.       Left eye bruising  Respiratory: Negative for cough, chest tightness and shortness of breath.   Cardiovascular: Negative for chest pain and palpitations.  Gastrointestinal: Negative for abdominal pain, constipation, diarrhea, nausea and vomiting.  Endocrine: Negative.   Genitourinary: Negative for dysuria and frequency.  Musculoskeletal: Negative for arthralgias, back pain, joint swelling and neck pain.  Skin: Negative for rash.   Allergic/Immunologic: Negative.   Neurological: Negative for tremors and numbness.  Hematological: Negative for adenopathy. Does not bruise/bleed easily.  Psychiatric/Behavioral: Negative for behavioral problems and sleep disturbance. The patient is not nervous/anxious.     Vital Signs: Temp 98.7 F (37.1 C)   Ht _0  (1.575 m)   Wt 127 lb (57.6 kg)   BMI 23.23 kg/m    Observation/Objective:  Well appearing, NAD noted.   Assessment/Plan: 1. Black eye, initial encounter Instructed patient to monitor for new or worse bruising, as well as visual disturbance or changes. Return to clinic if new or worsening.   2. Essential hypertension Controlled, continue present management.   General Counseling: Luellen Pucker  verbalizes understanding of the findings of today's phone visit and agrees with plan of treatment. I have discussed any further diagnostic evaluation that may be needed or ordered today. We also reviewed her medications today. she has been encouraged to call the office with any questions or concerns that should arise related to todays visit.    No orders of the defined types were placed in this encounter.   No orders of the defined types were placed in this encounter.   Time spent: [facility] Internal medicine

## 2019-07-03 DIAGNOSIS — J301 Allergic rhinitis due to pollen: Secondary | ICD-10-CM | POA: Diagnosis not present

## 2019-07-10 DIAGNOSIS — J301 Allergic rhinitis due to pollen: Secondary | ICD-10-CM | POA: Diagnosis not present

## 2019-07-10 NOTE — Progress Notes (Signed)
[place]  [phone]) [phone] [phone]) [phone]  ID: [name] OB: 1934-02-26  MR#: [id number]  SAY#:[id number]  Patient Care Team: Lavera Guise, MD as PCP - General (Internal Medicine)  CHIEF COMPLAINT:  Multiple myeloma in remission.  INTERVAL HISTORY: Patient returns to clinic today for repeat laboratory work and further evaluation.  She continues to feel well and remains asymptomatic.  She has a chronic, mild peripheral neuropathy that is unchanged and does not affect her day-to-day activity. She has no other neurologic complaints. She denies any recent fevers or illnesses. She has a good appetite and denies weight loss.  She denies any chest pain, shortness of breath, cough, or hemoptysis.  She denies any nausea, vomiting, constipation, or diarrhea.  She has no hematuria, melena, or hematochezia. She has no urinary complaints.  Patient offers no further specific complaints today.  REVIEW OF SYSTEMS:   Review of Systems  Constitutional: Negative.  Negative for fever, malaise/fatigue and weight loss.  HENT: Negative for congestion and sore throat.   Respiratory: Negative.  Negative for cough and shortness of breath.   Cardiovascular: Negative.  Negative for chest pain, palpitations and leg swelling.  Gastrointestinal: Negative.  Negative for abdominal pain, blood in stool, constipation, diarrhea, melena, nausea and vomiting.  Genitourinary: Negative.  Negative for dysuria.  Musculoskeletal: Negative.  Negative for back pain.  Skin: Negative.  Negative for rash.  Neurological: Positive for tingling and sensory change. Negative for focal weakness and weakness.  Psychiatric/Behavioral: Negative.  The patient is not nervous/anxious and does not have insomnia.     As per HPI. Otherwise, a complete review of systems is negative.  PAST MEDICAL HISTORY: Past Medical History:  Diagnosis Date  . Arthritis    hands  . Benign neoplasm of ascending colon    . Cataract   . GERD (gastroesophageal reflux disease)   . Hyperlipemia   . Hypertension 01/31/2015  . Hypothyroidism   . Multiple myeloma ([place]) 04/18/2016  . Multiple myeloma in remission ([place]) 04/18/2016  . Osteoporosis   . Shingles   . Skin cancer    basal cell    PAST SURGICAL HISTORY: Past Surgical History:  Procedure Laterality Date  . BREAST EXCISIONAL BIOPSY Right 15+ yrs ago   EXCISIONAL - NEG  . CATARACT EXTRACTION W/ INTRAOCULAR LENS IMPLANT    . CHOLECYSTECTOMY N/A 03/14/2017   Procedure: LAPAROSCOPIC CHOLECYSTECTOMY;  Surgeon: Florene Glen, MD;  Location: [facility];  Service: General;  Laterality: N/A;  . COLONOSCOPY WITH PROPOFOL N/A 02/21/2015   Procedure: COLONOSCOPY WITH PROPOFOL;  Surgeon: Lucilla Lame, MD;  Location: [facility];  Service: Endoscopy;  Laterality: N/A;  . [place]  . POLYPECTOMY  02/21/2015   Procedure: POLYPECTOMY;  Surgeon: Lucilla Lame, MD;  Location: [facility];  Service: Endoscopy;;    FAMILY HISTORY: Family History  Problem Relation Age of Onset  . Heart disease Mother   . Stroke Father   . Heart disease Brother   . Kidney cancer Neg Hx   . Prostate cancer Neg Hx   . Bladder Cancer Neg Hx   . Breast cancer Neg Hx     ADVANCED DIRECTIVES (Y/N):  N  HEALTH MAINTENANCE: Social History   Tobacco Use  . Smoking status: Never Smoker  . Smokeless tobacco: Never Used  Substance Use Topics  . Alcohol use: No    Alcohol/week: 0.0 standard drinks  . Drug use: No     Colonoscopy:  PAP:  Bone density:  Lipid panel:  No Known Allergies  Current Outpatient Medications  Medication Sig Dispense Refill  . aspirin EC 81 MG tablet Take 81 mg by mouth daily.    . Cyanocobalamin (B-12 PO) Take 500 mcg by mouth daily.     Marland Kitchen EPINEPHrine 0.3 mg/0.3 mL IJ SOAJ injection     . fexofenadine (ALLEGRA) 30 MG tablet Take 30 mg by mouth daily.     Marland Kitchen levothyroxine (SYNTHROID) 50 MCG tablet TAKE 1 TABLET BY  MOUTH DAILY BEFORE BREAKFAST 90 tablet 3  . Multiple Vitamins-Minerals (CENTRUM SILVER PO) Take 1 tablet by mouth daily.    Marland Kitchen triamcinolone (NASACORT ALLERGY 24HR) 55 MCG/ACT AERO nasal inhaler Place 2 sprays into the nose 2 (two) times daily as needed.     Marland Kitchen UNABLE TO FIND Inject 1 Dose as directed once a week. Allergy injections once a week     . lenalidomide (REVLIMID) 10 MG capsule Take 1 capsule (10 mg total) by mouth daily. 28 capsule 5  . valACYclovir (VALTREX) 1000 MG tablet Take 1 tablet (1,000 mg total) by mouth 2 (two) times daily. (Patient not taking: Reported on 07/10/2019) 20 tablet 0   No current facility-administered medications for this visit.    OBJECTIVE: Vitals:   07/13/19 0926  BP: (!) 129/54  Pulse: 68  Resp: 18  Temp: 98.6 F (37 C)     Body mass index is 23.59 kg/m.    ECOG FS:0 - Asymptomatic  General: Well-developed, well-nourished, no acute distress. Eyes: Pink conjunctiva, anicteric sclera. HEENT: Normocephalic, moist mucous membranes. Lungs: No audible wheezing or coughing. Heart: Regular rate and rhythm. Abdomen: Soft, nontender, no obvious distention. Musculoskeletal: No edema, cyanosis, or clubbing. Neuro: Alert, answering all questions appropriately. Cranial nerves grossly intact. Skin: No rashes or petechiae noted. Psych: Normal affect.  LAB RESULTS:  Lab Results  Component Value Date   NA 135 07/13/2019   K 4.2 07/13/2019   CL 108 07/13/2019   CO2 21 (L) 07/13/2019   GLUCOSE 92 07/13/2019   BUN 20 07/13/2019   CREATININE 1.02 (H) 07/13/2019   CALCIUM 10.4 (H) 07/13/2019   PROT 6.5 03/04/2017   ALBUMIN 4.2 03/04/2017   AST 24 03/04/2017   ALT 16 03/04/2017   ALKPHOS 49 03/04/2017   BILITOT 0.5 03/04/2017   GFRNONAA 51 (L) 07/13/2019   GFRAA 59 (L) 07/13/2019    Lab Results  Component Value Date   WBC 5.4 07/13/2019   NEUTROABS 3.2 07/13/2019   HGB 10.8 (L) 07/13/2019   HCT 32.8 (L) 07/13/2019   MCV 100.0 07/13/2019   PLT  187 07/13/2019   Lab Results  Component Value Date   TOTALPROTELP 6.4 06/15/2019   ALBUMINELP 4.3 06/15/2019   A1GS 0.2 06/15/2019   A2GS 0.6 06/15/2019   BETS 0.8 06/15/2019   GAMS 0.5 06/15/2019   MSPIKE 0.2 (H) 06/15/2019   SPEI Comment 06/15/2019     STUDIES: No results found.  ASSESSMENT:  Multiple myeloma in remission.  PLAN:    1.  Multiple myeloma in remission: Bone marrow biopsy on April 09, 2016 revealed 44% plasma cells and normal cytogenetics. Given the results of her metastatic bone survey on March 12, 2016, her elevated lambda free chains, and hypercalcemia, patient fit the criteria for multiple myeloma and was treated with Revlimid. Patient's M spike appears to fluctuate between 0.0 and 0.2 with her most recent results on June 15, 2019 reported 0.2. IgG, IgM, and IgA are all mildly decreased and  unchanged.  Lambda free light chains continue to increase and is now 4668.1.  Metastatic bone survey on February 12, 2019 revealed new small lesions in her calvarium.  Hemoglobin is decreased, but stable at 10.8.  Calcium levels fluctuate, but remain elevated at 10.4 today.  After lengthy discussion with the patient and her continue climb of lambda free light chains, it was agreed upon to reinitiate treatment with low-dose Revlimid 10 mg daily.  Patient has been instructed to not initiate treatment until she receives her second Covid vaccine in approximately 10 days.  Proceed with Zometa today.  Return to clinic in 4 weeks for further evaluation, laboratory work, and to assess her toleration of Revlimid.   2. Hypercalcemia: Calcium levels are 10.4 today.  Patient initiated monthly Zometa on May 07, 2016.  Proceed with treatment today. 3.  Anemia: Chronic and unchanged.  Hemoglobin is 10.8 today. 4.  Peripheral neuropathy: Patient was previously given a referral to acupuncture which she said did not help much.  Monitor closely with the reinitiation of Revlimid.  Patient  expressed understanding and was in agreement with this plan. She also understands that She can call clinic at any time with any questions, concerns, or complaints.    Lloyd Huger, MD 07/13/19 10:12 AM

## 2019-07-10 NOTE — Progress Notes (Signed)
Patient is coming in for follow up and treatment, she is doing well no complaints. She will call to let us know if she needs to move appointment due to inclement weather on Sunday into Monday

## 2019-07-13 ENCOUNTER — Other Ambulatory Visit: Payer: Self-pay

## 2019-07-13 ENCOUNTER — Other Ambulatory Visit: Payer: Self-pay | Admitting: Emergency Medicine

## 2019-07-13 ENCOUNTER — Inpatient Hospital Stay: Payer: Medicare PPO | Attending: Oncology

## 2019-07-13 ENCOUNTER — Inpatient Hospital Stay: Payer: Medicare PPO

## 2019-07-13 ENCOUNTER — Inpatient Hospital Stay (HOSPITAL_BASED_OUTPATIENT_CLINIC_OR_DEPARTMENT_OTHER): Payer: Medicare PPO | Admitting: Oncology

## 2019-07-13 VITALS — BP 129/54 | HR 68 | Temp 98.6°F | Resp 18 | Ht 62.0 in | Wt 129.0 lb

## 2019-07-13 DIAGNOSIS — E039 Hypothyroidism, unspecified: Secondary | ICD-10-CM | POA: Insufficient documentation

## 2019-07-13 DIAGNOSIS — E785 Hyperlipidemia, unspecified: Secondary | ICD-10-CM | POA: Diagnosis not present

## 2019-07-13 DIAGNOSIS — G629 Polyneuropathy, unspecified: Secondary | ICD-10-CM | POA: Diagnosis not present

## 2019-07-13 DIAGNOSIS — M1990 Unspecified osteoarthritis, unspecified site: Secondary | ICD-10-CM | POA: Diagnosis not present

## 2019-07-13 DIAGNOSIS — Z7982 Long term (current) use of aspirin: Secondary | ICD-10-CM | POA: Diagnosis not present

## 2019-07-13 DIAGNOSIS — K219 Gastro-esophageal reflux disease without esophagitis: Secondary | ICD-10-CM | POA: Diagnosis not present

## 2019-07-13 DIAGNOSIS — C9001 Multiple myeloma in remission: Secondary | ICD-10-CM | POA: Diagnosis not present

## 2019-07-13 DIAGNOSIS — D649 Anemia, unspecified: Secondary | ICD-10-CM | POA: Insufficient documentation

## 2019-07-13 DIAGNOSIS — I10 Essential (primary) hypertension: Secondary | ICD-10-CM | POA: Diagnosis not present

## 2019-07-13 DIAGNOSIS — Z8249 Family history of ischemic heart disease and other diseases of the circulatory system: Secondary | ICD-10-CM | POA: Insufficient documentation

## 2019-07-13 DIAGNOSIS — Z85828 Personal history of other malignant neoplasm of skin: Secondary | ICD-10-CM | POA: Insufficient documentation

## 2019-07-13 LAB — BASIC METABOLIC PANEL
Anion gap: 6 (ref 5–15)
BUN: 20 mg/dL (ref 8–23)
CO2: 21 mmol/L — ABNORMAL LOW (ref 22–32)
Calcium: 10.4 mg/dL — ABNORMAL HIGH (ref 8.9–10.3)
Chloride: 108 mmol/L (ref 98–111)
Creatinine, Ser: 1.02 mg/dL — ABNORMAL HIGH (ref 0.44–1.00)
GFR calc Af Amer: 59 mL/min — ABNORMAL LOW (ref >60)
GFR calc non Af Amer: 51 mL/min — ABNORMAL LOW (ref >60)
Glucose, Bld: 92 mg/dL (ref 70–99)
Potassium: 4.2 mmol/L (ref 3.5–5.1)
Sodium: 135 mmol/L (ref 135–145)

## 2019-07-13 LAB — CBC WITH DIFFERENTIAL/PLATELET
Abs Immature Granulocytes: 0.04 10*3/uL (ref 0.00–0.07)
Basophils Absolute: 0.1 10*3/uL (ref 0.0–0.1)
Basophils Relative: 1 %
Eosinophils Absolute: 0 10*3/uL (ref 0.0–0.5)
Eosinophils Relative: 1 %
HCT: 32.8 % — ABNORMAL LOW (ref 36.0–46.0)
Hemoglobin: 10.8 g/dL — ABNORMAL LOW (ref 12.0–15.0)
Immature Granulocytes: 1 %
Lymphocytes Relative: 23 %
Lymphs Abs: 1.2 10*3/uL (ref 0.7–4.0)
MCH: 32.9 pg (ref 26.0–34.0)
MCHC: 32.9 g/dL (ref 30.0–36.0)
MCV: 100 fL (ref 80.0–100.0)
Monocytes Absolute: 0.8 10*3/uL (ref 0.1–1.0)
Monocytes Relative: 15 %
Neutro Abs: 3.2 10*3/uL (ref 1.7–7.7)
Neutrophils Relative %: 59 %
Platelets: 187 10*3/uL (ref 150–400)
RBC: 3.28 MIL/uL — ABNORMAL LOW (ref 3.87–5.11)
RDW: 13 % (ref 11.5–15.5)
WBC: 5.4 10*3/uL (ref 4.0–10.5)
nRBC: 0 % (ref 0.0–0.2)

## 2019-07-13 MED ORDER — LENALIDOMIDE 10 MG PO CAPS: 10.0000 mg | ORAL_CAPSULE | Freq: Every day | ORAL | 5 refills | Status: DC

## 2019-07-13 MED ORDER — SODIUM CHLORIDE 0.9 % IV SOLN
INTRAVENOUS | Status: DC
  Filled 2019-07-13: qty 250

## 2019-07-13 MED ORDER — ZOLEDRONIC ACID 4 MG/5ML IV CONC
3.0000 mg | Freq: Once | INTRAVENOUS | Status: AC
  Administered 2019-07-13: 11:00:00 3 mg via INTRAVENOUS
  Filled 2019-07-13: qty 3.75

## 2019-07-13 NOTE — Addendum Note (Signed)
Addended by: Mila Merry on: 07/13/2019 10:55 AM   Modules accepted: Orders

## 2019-07-14 LAB — PROTEIN ELECTROPHORESIS, SERUM
A/G Ratio: 1.6 (ref 0.7–1.7)
Albumin ELP: 3.8 g/dL (ref 2.9–4.4)
Alpha-1-Globulin: 0.2 g/dL (ref 0.0–0.4)
Alpha-2-Globulin: 0.7 g/dL (ref 0.4–1.0)
Beta Globulin: 0.8 g/dL (ref 0.7–1.3)
Gamma Globulin: 0.6 g/dL (ref 0.4–1.8)
Globulin, Total: 2.4 g/dL (ref 2.2–3.9)
M-Spike, %: 0.2 g/dL — ABNORMAL HIGH
Total Protein ELP: 6.2 g/dL (ref 6.0–8.5)

## 2019-07-14 LAB — KAPPA/LAMBDA LIGHT CHAINS
Kappa free light chain: 8.9 mg/L (ref 3.3–19.4)
Kappa, lambda light chain ratio: 0 — ABNORMAL LOW (ref 0.26–1.65)
Lambda free light chains: 4969.6 mg/L — ABNORMAL HIGH (ref 5.7–26.3)

## 2019-07-14 LAB — IGG, IGA, IGM
IgA: 15 mg/dL — ABNORMAL LOW (ref 64–422)
IgG (Immunoglobin G), Serum: 465 mg/dL — ABNORMAL LOW (ref 586–1602)
IgM (Immunoglobulin M), Srm: 7 mg/dL — ABNORMAL LOW (ref 26–217)

## 2019-07-17 DIAGNOSIS — J301 Allergic rhinitis due to pollen: Secondary | ICD-10-CM | POA: Diagnosis not present

## 2019-07-31 DIAGNOSIS — J301 Allergic rhinitis due to pollen: Secondary | ICD-10-CM | POA: Diagnosis not present

## 2019-08-03 ENCOUNTER — Other Ambulatory Visit: Payer: Self-pay | Admitting: Oncology

## 2019-08-03 ENCOUNTER — Telehealth: Payer: Self-pay | Admitting: *Deleted

## 2019-08-03 DIAGNOSIS — C9000 Multiple myeloma not having achieved remission: Secondary | ICD-10-CM

## 2019-08-03 NOTE — Telephone Encounter (Signed)
Patient called reporting that she started taking Revlimid last week and last night, she started having severe itching of the scalp and still this morning as well as her scalp feeling very hot. She is asking what to do, can she take a benadryl or what? Please advise

## 2019-08-03 NOTE — Telephone Encounter (Signed)
Benadryl is fine.  Usual since she has had revilimid before.

## 2019-08-03 NOTE — Telephone Encounter (Signed)
Patient informed ok to take Benadryl

## 2019-08-07 ENCOUNTER — Other Ambulatory Visit: Payer: Self-pay

## 2019-08-07 ENCOUNTER — Encounter: Payer: Self-pay | Admitting: Oncology

## 2019-08-07 NOTE — Progress Notes (Signed)
Patient pre screened for visit. She reports that since starting Revlimid her appetite is diminished and she states nothing tastes good.

## 2019-08-08 NOTE — Progress Notes (Signed)
[place]  Telephone:(336) [phone] [phone]) [phone]  ID: [name] OB: 02-16-1934  MR#: [id number]  AST#:[id number]  Patient Care Team: Lavera Guise, MD as PCP - General (Internal Medicine)  CHIEF COMPLAINT:  Multiple myeloma in remission.  INTERVAL HISTORY: Patient returns to clinic today for repeat laboratory work, further evaluation, and assessment of her toleration of Revlimid.  Patient states she had a significant rash for several days after initiating treatment, but this is confounded by the fact that she also received her Covid vaccine around the same time.  Despite continue taking Revlimid, the rash resolved.  She also has noted increased weakness and fatigue over the same timeframe.  She otherwise feels well. She has a chronic, mild peripheral neuropathy that is unchanged and does not affect her day-to-day activity. She has no other neurologic complaints. She denies any recent fevers or illnesses. She has a good appetite and denies weight loss.  She denies any chest pain, shortness of breath, cough, or hemoptysis.  She denies any nausea, vomiting, constipation, or diarrhea.  She has no hematuria, melena, or hematochezia. She has no urinary complaints.  Patient offers no further specific complaints today.  REVIEW OF SYSTEMS:   Review of Systems  Constitutional: Negative.  Negative for fever, malaise/fatigue and weight loss.  HENT: Negative for congestion and sore throat.   Respiratory: Negative.  Negative for cough and shortness of breath.   Cardiovascular: Negative.  Negative for chest pain, palpitations and leg swelling.  Gastrointestinal: Negative.  Negative for abdominal pain, blood in stool, constipation, diarrhea, melena, nausea and vomiting.  Genitourinary: Negative.  Negative for dysuria.  Musculoskeletal: Negative.  Negative for back pain.  Skin: Negative.  Negative for rash.  Neurological: Positive for tingling and sensory change. Negative for  focal weakness and weakness.  Psychiatric/Behavioral: Negative.  The patient is not nervous/anxious and does not have insomnia.     As per HPI. Otherwise, a complete review of systems is negative.  PAST MEDICAL HISTORY: Past Medical History:  Diagnosis Date  . Arthritis    hands  . Benign neoplasm of ascending colon   . Cataract   . GERD (gastroesophageal reflux disease)   . Hyperlipemia   . Hypertension 01/31/2015  . Hypothyroidism   . Multiple myeloma ([place]) 04/18/2016  . Multiple myeloma in remission ([place]) 04/18/2016  . Osteoporosis   . Shingles   . Skin cancer    basal cell    PAST SURGICAL HISTORY: Past Surgical History:  Procedure Laterality Date  . BREAST EXCISIONAL BIOPSY Right 15+ yrs ago   EXCISIONAL - NEG  . CATARACT EXTRACTION W/ INTRAOCULAR LENS IMPLANT    . CHOLECYSTECTOMY N/A 03/14/2017   Procedure: LAPAROSCOPIC CHOLECYSTECTOMY;  Surgeon: Florene Glen, MD;  Location: [facility];  Service: General;  Laterality: N/A;  . COLONOSCOPY WITH PROPOFOL N/A 02/21/2015   Procedure: COLONOSCOPY WITH PROPOFOL;  Surgeon: Lucilla Lame, MD;  Location: [place] [facility];  Service: Endoscopy;  Laterality: N/A;  . [place]  . POLYPECTOMY  02/21/2015   Procedure: POLYPECTOMY;  Surgeon: Lucilla Lame, MD;  Location: [place];  Service: Endoscopy;;    FAMILY HISTORY: Family History  Problem Relation Age of Onset  . Heart disease Mother   . Stroke Father   . Heart disease Brother   . Kidney cancer Neg Hx   . Prostate cancer Neg Hx   . Bladder Cancer Neg Hx   . Breast cancer Neg Hx     ADVANCED  DIRECTIVES (Y/N):  N  HEALTH MAINTENANCE: Social History   Tobacco Use  . Smoking status: Never Smoker  . Smokeless tobacco: Never Used  Substance Use Topics  . Alcohol use: No    Alcohol/week: 0.0 standard drinks  . Drug use: No     Colonoscopy:  PAP:  Bone density:  Lipid panel:  No Known Allergies  Current Outpatient Medications   Medication Sig Dispense Refill  . aspirin EC 81 MG tablet Take 81 mg by mouth daily.    . Cyanocobalamin (B-12 PO) Take 500 mcg by mouth daily.     Marland Kitchen EPINEPHrine 0.3 mg/0.3 mL IJ SOAJ injection     . fexofenadine (ALLEGRA) 30 MG tablet Take 30 mg by mouth daily.     Marland Kitchen levothyroxine (SYNTHROID) 50 MCG tablet TAKE 1 TABLET BY MOUTH DAILY BEFORE BREAKFAST 90 tablet 3  . Multiple Vitamins-Minerals (CENTRUM SILVER PO) Take 1 tablet by mouth daily.    Marland Kitchen triamcinolone (NASACORT ALLERGY 24HR) 55 MCG/ACT AERO nasal inhaler Place 2 sprays into the nose 2 (two) times daily as needed.     Marland Kitchen UNABLE TO FIND Inject 1 Dose as directed once a week. Allergy injections once a week     . valACYclovir (VALTREX) 1000 MG tablet Take 1 tablet (1,000 mg total) by mouth 2 (two) times daily. 20 tablet 0  . lenalidomide (REVLIMID) 10 MG capsule Take 1 capsule (10 mg total) by mouth daily. Fanny Dance #[id number]    Date Obtained 08/10/2019 28 capsule 0   No current facility-administered medications for this visit.    OBJECTIVE: Vitals:   08/10/19 1029  BP: 120/61  Pulse: 78  Resp: 16  Temp: 97.7 F (36.5 C)  SpO2: 100%     Body mass index is 23.17 kg/m.    ECOG FS:0 - Asymptomatic  General: Thin, no acute distress. Eyes: Pink conjunctiva, anicteric sclera. HEENT: Normocephalic, moist mucous membranes. Lungs: No audible wheezing or coughing. Heart: Regular rate and rhythm. Abdomen: Soft, nontender, no obvious distention. Musculoskeletal: No edema, cyanosis, or clubbing. Neuro: Alert, answering all questions appropriately. Cranial nerves grossly intact. Skin: Rash has resolved.   Psych: Normal affect.   LAB RESULTS:  Lab Results  Component Value Date   NA 137 08/10/2019   K 3.9 08/10/2019   CL 105 08/10/2019   CO2 22 08/10/2019   GLUCOSE 86 08/10/2019   BUN 19 08/10/2019   CREATININE 1.09 (H) 08/10/2019   CALCIUM 10.9 (H) 08/10/2019   PROT 6.5 03/04/2017   ALBUMIN 4.2 03/04/2017   AST 24  03/04/2017   ALT 16 03/04/2017   ALKPHOS 49 03/04/2017   BILITOT 0.5 03/04/2017   GFRNONAA 47 (L) 08/10/2019   GFRAA 54 (L) 08/10/2019    Lab Results  Component Value Date   WBC 5.4 08/10/2019   NEUTROABS 3.6 08/10/2019   HGB 10.4 (L) 08/10/2019   HCT 31.4 (L) 08/10/2019   MCV 100.3 (H) 08/10/2019   PLT 174 08/10/2019   Lab Results  Component Value Date   TOTALPROTELP 6.2 07/13/2019   ALBUMINELP 3.8 07/13/2019   A1GS 0.2 07/13/2019   A2GS 0.7 07/13/2019   BETS 0.8 07/13/2019   GAMS 0.6 07/13/2019   MSPIKE 0.2 (H) 07/13/2019   SPEI Comment 07/13/2019     STUDIES: No results found.  ASSESSMENT:  Multiple myeloma in remission.  PLAN:    1.  Multiple myeloma in remission: Bone marrow biopsy on April 09, 2016 revealed 44% plasma cells and normal cytogenetics. Given the  results of her metastatic bone survey on March 12, 2016, her elevated lambda free chains, and hypercalcemia, patient fit the criteria for multiple myeloma and was treated with Revlimid. Patient's M spike appears to fluctuate between 0.0 and 0.2 with her most recent results on June 15, 2019 reported 0.2. IgG, IgM, and IgA are all mildly decreased and unchanged.  Lambda free light chains continue to increase and is now 4668.1.  Metastatic bone survey on February 12, 2019 revealed new small lesions in her calvarium.  Hemoglobin is decreased, but stable at 10.8.  Calcium levels fluctuate, but remain elevated at 10.9 today.  After lengthy discussion with the patient and her increasing serum lambda free light chains, it was agreed upon to reinitiate treatment with low-dose Revlimid 10 mg daily.  Unclear if her symptoms were related to the Covid vaccine or initiating Revlimid, so patient will hold treatment for 2 weeks and then reinitiate at 10 mg daily.  Patient will then return to clinic in 4 weeks for repeat laboratory work and further evaluation.    2. Hypercalcemia: Calcium levels are 10.9 today.  Patient initiated  monthly Zometa on May 07, 2016.  Patient received Zometa approximately 4 weeks ago. 3.  Anemia: Chronic and unchanged.  Hemoglobin is 10.4 today.   4.  Peripheral neuropathy: Patient was previously given a referral to acupuncture which she said did not help much.  Monitor closely with the reinitiation of Revlimid.  Patient expressed understanding and was in agreement with this plan. She also understands that She can call clinic at any time with any questions, concerns, or complaints.    Lloyd Huger, MD 08/10/19 1:03 PM

## 2019-08-10 ENCOUNTER — Inpatient Hospital Stay (HOSPITAL_BASED_OUTPATIENT_CLINIC_OR_DEPARTMENT_OTHER): Payer: Medicare PPO | Admitting: Oncology

## 2019-08-10 ENCOUNTER — Other Ambulatory Visit: Payer: Self-pay

## 2019-08-10 ENCOUNTER — Inpatient Hospital Stay: Payer: Medicare PPO | Attending: Oncology

## 2019-08-10 VITALS — BP 120/61 | HR 78 | Temp 97.7°F | Resp 16 | Wt 126.7 lb

## 2019-08-10 DIAGNOSIS — D649 Anemia, unspecified: Secondary | ICD-10-CM | POA: Insufficient documentation

## 2019-08-10 DIAGNOSIS — Z85828 Personal history of other malignant neoplasm of skin: Secondary | ICD-10-CM | POA: Diagnosis not present

## 2019-08-10 DIAGNOSIS — R5383 Other fatigue: Secondary | ICD-10-CM | POA: Insufficient documentation

## 2019-08-10 DIAGNOSIS — M1990 Unspecified osteoarthritis, unspecified site: Secondary | ICD-10-CM | POA: Insufficient documentation

## 2019-08-10 DIAGNOSIS — Z7982 Long term (current) use of aspirin: Secondary | ICD-10-CM | POA: Diagnosis not present

## 2019-08-10 DIAGNOSIS — C9002 Multiple myeloma in relapse: Secondary | ICD-10-CM | POA: Diagnosis not present

## 2019-08-10 DIAGNOSIS — C9001 Multiple myeloma in remission: Secondary | ICD-10-CM

## 2019-08-10 DIAGNOSIS — Z79899 Other long term (current) drug therapy: Secondary | ICD-10-CM | POA: Diagnosis not present

## 2019-08-10 DIAGNOSIS — K219 Gastro-esophageal reflux disease without esophagitis: Secondary | ICD-10-CM | POA: Diagnosis not present

## 2019-08-10 DIAGNOSIS — Z8249 Family history of ischemic heart disease and other diseases of the circulatory system: Secondary | ICD-10-CM | POA: Insufficient documentation

## 2019-08-10 DIAGNOSIS — R531 Weakness: Secondary | ICD-10-CM | POA: Diagnosis not present

## 2019-08-10 DIAGNOSIS — I10 Essential (primary) hypertension: Secondary | ICD-10-CM | POA: Diagnosis not present

## 2019-08-10 DIAGNOSIS — Z7952 Long term (current) use of systemic steroids: Secondary | ICD-10-CM | POA: Diagnosis not present

## 2019-08-10 DIAGNOSIS — E039 Hypothyroidism, unspecified: Secondary | ICD-10-CM | POA: Diagnosis not present

## 2019-08-10 DIAGNOSIS — C9000 Multiple myeloma not having achieved remission: Secondary | ICD-10-CM

## 2019-08-10 DIAGNOSIS — G629 Polyneuropathy, unspecified: Secondary | ICD-10-CM | POA: Diagnosis not present

## 2019-08-10 DIAGNOSIS — E785 Hyperlipidemia, unspecified: Secondary | ICD-10-CM | POA: Diagnosis not present

## 2019-08-10 LAB — CBC WITH DIFFERENTIAL/PLATELET
Abs Immature Granulocytes: 0.06 10*3/uL (ref 0.00–0.07)
Basophils Absolute: 0 10*3/uL (ref 0.0–0.1)
Basophils Relative: 1 %
Eosinophils Absolute: 0.1 10*3/uL (ref 0.0–0.5)
Eosinophils Relative: 2 %
HCT: 31.4 % — ABNORMAL LOW (ref 36.0–46.0)
Hemoglobin: 10.4 g/dL — ABNORMAL LOW (ref 12.0–15.0)
Immature Granulocytes: 1 %
Lymphocytes Relative: 19 %
Lymphs Abs: 1.1 10*3/uL (ref 0.7–4.0)
MCH: 33.2 pg (ref 26.0–34.0)
MCHC: 33.1 g/dL (ref 30.0–36.0)
MCV: 100.3 fL — ABNORMAL HIGH (ref 80.0–100.0)
Monocytes Absolute: 0.6 10*3/uL (ref 0.1–1.0)
Monocytes Relative: 11 %
Neutro Abs: 3.6 10*3/uL (ref 1.7–7.7)
Neutrophils Relative %: 66 %
Platelets: 174 10*3/uL (ref 150–400)
RBC: 3.13 MIL/uL — ABNORMAL LOW (ref 3.87–5.11)
RDW: 12.7 % (ref 11.5–15.5)
WBC: 5.4 10*3/uL (ref 4.0–10.5)
nRBC: 0 % (ref 0.0–0.2)

## 2019-08-10 LAB — BASIC METABOLIC PANEL
Anion gap: 10 (ref 5–15)
BUN: 19 mg/dL (ref 8–23)
CO2: 22 mmol/L (ref 22–32)
Calcium: 10.9 mg/dL — ABNORMAL HIGH (ref 8.9–10.3)
Chloride: 105 mmol/L (ref 98–111)
Creatinine, Ser: 1.09 mg/dL — ABNORMAL HIGH (ref 0.44–1.00)
GFR calc Af Amer: 54 mL/min — ABNORMAL LOW (ref >60)
GFR calc non Af Amer: 47 mL/min — ABNORMAL LOW (ref >60)
Glucose, Bld: 86 mg/dL (ref 70–99)
Potassium: 3.9 mmol/L (ref 3.5–5.1)
Sodium: 137 mmol/L (ref 135–145)

## 2019-08-10 MED ORDER — LENALIDOMIDE 10 MG PO CAPS: 10.0000 mg | ORAL_CAPSULE | Freq: Every day | ORAL | 0 refills | Status: DC

## 2019-08-11 LAB — PROTEIN ELECTROPHORESIS, SERUM
A/G Ratio: 2.4 — ABNORMAL HIGH (ref 0.7–1.7)
Albumin ELP: 4.3 g/dL (ref 2.9–4.4)
Alpha-1-Globulin: 0.2 g/dL (ref 0.0–0.4)
Alpha-2-Globulin: 0.5 g/dL (ref 0.4–1.0)
Beta Globulin: 0.7 g/dL (ref 0.7–1.3)
Gamma Globulin: 0.4 g/dL (ref 0.4–1.8)
Globulin, Total: 1.8 g/dL — ABNORMAL LOW (ref 2.2–3.9)
M-Spike, %: 0.2 g/dL — ABNORMAL HIGH
Total Protein ELP: 6.1 g/dL (ref 6.0–8.5)

## 2019-08-11 LAB — KAPPA/LAMBDA LIGHT CHAINS
Kappa free light chain: 16.2 mg/L (ref 3.3–19.4)
Kappa, lambda light chain ratio: 0 — ABNORMAL LOW (ref 0.26–1.65)
Lambda free light chains: 3962 mg/L — ABNORMAL HIGH (ref 5.7–26.3)

## 2019-08-11 LAB — IGG, IGA, IGM
IgA: 17 mg/dL — ABNORMAL LOW (ref 64–422)
IgG (Immunoglobin G), Serum: 461 mg/dL — ABNORMAL LOW (ref 586–1602)
IgM (Immunoglobulin M), Srm: 9 mg/dL — ABNORMAL LOW (ref 26–217)

## 2019-08-14 DIAGNOSIS — J301 Allergic rhinitis due to pollen: Secondary | ICD-10-CM | POA: Diagnosis not present

## 2019-08-21 DIAGNOSIS — J301 Allergic rhinitis due to pollen: Secondary | ICD-10-CM | POA: Diagnosis not present

## 2019-08-27 ENCOUNTER — Inpatient Hospital Stay (HOSPITAL_BASED_OUTPATIENT_CLINIC_OR_DEPARTMENT_OTHER): Payer: Medicare PPO | Admitting: Nurse Practitioner

## 2019-08-27 ENCOUNTER — Other Ambulatory Visit: Payer: Self-pay

## 2019-08-27 ENCOUNTER — Encounter: Payer: Self-pay | Admitting: Nurse Practitioner

## 2019-08-27 ENCOUNTER — Telehealth: Payer: Self-pay | Admitting: *Deleted

## 2019-08-27 DIAGNOSIS — R239 Unspecified skin changes: Secondary | ICD-10-CM

## 2019-08-27 DIAGNOSIS — T451X5A Adverse effect of antineoplastic and immunosuppressive drugs, initial encounter: Secondary | ICD-10-CM | POA: Diagnosis not present

## 2019-08-27 MED ORDER — TRIAMCINOLONE ACETONIDE 0.1 % EX CREA: 1.0000 "application " | TOPICAL_CREAM | Freq: Two times a day (BID) | CUTANEOUS | 0 refills | Status: DC

## 2019-08-27 MED ORDER — PREDNISONE 10 MG (21) PO TBPK: ORAL_TABLET | ORAL | 0 refills | Status: DC

## 2019-08-27 NOTE — Telephone Encounter (Signed)
Patient called to report that she has had another reaction to the Revlimid and has stopped taking it. Her scalp is red and painful as well as itching very bad behind her ears and her face is very red as well. She is asking what to do now. Please advise

## 2019-08-27 NOTE — Telephone Encounter (Signed)
Patient agreed to hold Revlimid and have a MyChart visit with Beckey Rutter, NP today at 1:30.

## 2019-08-27 NOTE — Progress Notes (Signed)
Virtual Visit Progress Note  [facility] [place]  Telephone:([phone] Fax:([phone]  I connected with LOCKLYN HENRIQUEZ on 08/27/19 at  1:30 PM EDT by video enabled telemedicine visit and verified that I am speaking with the correct person using two identifiers.   I discussed the limitations, risks, security and privacy concerns of performing an evaluation and management service by telemedicine and the availability of in-person appointments. I also discussed with the patient that there may be a patient responsible charge related to this service. The patient expressed understanding and agreed to proceed.   Other persons participating in the visit and their role in the encounter: None  Patient's location: Home Provider's location: Clinic  Chief Complaint: Rash and itching    Patient Care Team: Lavera Guise, MD as PCP - General (Internal Medicine)   Name of the patient: Rhonda [name]  [id number]  1934-04-20   Date of visit: 08/27/19  Diagnosis-multiple myeloma  Chief complaint/ Reason for visit-rash and itching  Heme/Onc history:  Oncology History   No history exists.    Interval history- Rhonda [name], 86 year old female diagnosed with multiple myeloma, currently on Revlimid, presents to symptom management clinic for complaints of itching and skin rash which she localizes to her scalp and behind right ear.  Says symptoms started approximately 1 to 2 days after she started her Revlimid and have persisted since that time.  She was told earlier today by Dr. Grayland Ormond to stop her Revlimid which she has done.  Says this happened last time she took Revlimid in February and she is unsure if she will be able to tolerate it.  Has tried taking Benadryl but it makes her so drowsy.  Has been putting lotion on the areas.  No open sores or evidence of infection by her report.  Otherwise feels well.  No new shampoos or topicals.  No other new  medications.  Review of systems- Review of Systems  Constitutional: Negative for chills, fever, malaise/fatigue and weight loss.  HENT: Negative for hearing loss, nosebleeds, sore throat and tinnitus.   Eyes: Negative for blurred vision and double vision.  Respiratory: Negative for cough, hemoptysis, shortness of breath and wheezing.   Cardiovascular: Negative for chest pain, palpitations and leg swelling.  Gastrointestinal: Negative for abdominal pain, blood in stool, constipation, diarrhea, melena, nausea and vomiting.  Genitourinary: Negative for dysuria and urgency.  Musculoskeletal: Negative for back pain, falls, joint pain and myalgias.  Skin: Positive for itching and rash.  Neurological: Negative for dizziness, tingling, sensory change, loss of consciousness, weakness and headaches.  Endo/Heme/Allergies: Negative for environmental allergies. Does not bruise/bleed easily.  Psychiatric/Behavioral: Negative for depression. The patient is not nervous/anxious and does not have insomnia.     No Known Allergies  Past Medical History:  Diagnosis Date  . Arthritis    hands  . Benign neoplasm of ascending colon   . Cataract   . GERD (gastroesophageal reflux disease)   . Hyperlipemia   . Hypertension 01/31/2015  . Hypothyroidism   . Multiple myeloma ([place]) 04/18/2016  . Multiple myeloma in remission ([place]) 04/18/2016  . Osteoporosis   . Shingles   . Skin cancer    basal cell    Past Surgical History:  Procedure Laterality Date  . BREAST EXCISIONAL BIOPSY Right 15+ yrs ago   EXCISIONAL - NEG  . CATARACT EXTRACTION W/ INTRAOCULAR LENS IMPLANT    . CHOLECYSTECTOMY N/A 03/14/2017   Procedure: LAPAROSCOPIC CHOLECYSTECTOMY;  Surgeon: Phoebe Perch  E, MD;  Location: [facility];  Service: General;  Laterality: N/A;  . COLONOSCOPY WITH PROPOFOL N/A 02/21/2015   Procedure: COLONOSCOPY WITH PROPOFOL;  Surgeon: Lucilla Lame, MD;  Location: [place];  Service: Endoscopy;  Laterality:  N/A;  . [place]  . POLYPECTOMY  02/21/2015   Procedure: POLYPECTOMY;  Surgeon: Lucilla Lame, MD;  Location: [place];  Service: Endoscopy;;    Social History   Socioeconomic History  . Marital status: Widowed    Spouse name: Not on file  . Number of children: Not on file  . Years of education: Not on file  . Highest education level: Not on file  Occupational History  . Not on file  Tobacco Use  . Smoking status: Never Smoker  . Smokeless tobacco: Never Used  Substance and Sexual Activity  . Alcohol use: No    Alcohol/week: 0.0 standard drinks  . Drug use: No  . Sexual activity: Not on file  Other Topics Concern  . Not on file  Social History Narrative  . Not on file   Social Determinants of Health   Financial Resource Strain:   . Difficulty of Paying Living Expenses:   Food Insecurity:   . Worried About Charity fundraiser in the Last Year:   . Arboriculturist in the Last Year:   Transportation Needs:   . Film/video editor (Medical):   Marland Kitchen Lack of Transportation (Non-Medical):   Physical Activity:   . Days of Exercise per Week:   . Minutes of Exercise per Session:   Stress:   . Feeling of Stress :   Social Connections:   . Frequency of Communication with Friends and Family:   . Frequency of Social Gatherings with Friends and Family:   . Attends Religious Services:   . Active Member of Clubs or Organizations:   . Attends Archivist Meetings:   Marland Kitchen Marital Status:   Intimate Partner Violence:   . Fear of Current or Ex-Partner:   . Emotionally Abused:   Marland Kitchen Physically Abused:   . Sexually Abused:     Family History  Problem Relation Age of Onset  . Heart disease Mother   . Stroke Father   . Heart disease Brother   . Kidney cancer Neg Hx   . Prostate cancer Neg Hx   . Bladder Cancer Neg Hx   . Breast cancer Neg Hx      Current Outpatient Medications:  .  aspirin EC 81 MG tablet, Take 81 mg by mouth daily.,  Disp: , Rfl:  .  Cyanocobalamin (B-12 PO), Take 500 mcg by mouth daily. , Disp: , Rfl:  .  EPINEPHrine 0.3 mg/0.3 mL IJ SOAJ injection, , Disp: , Rfl:  .  fexofenadine (ALLEGRA) 30 MG tablet, Take 30 mg by mouth daily. , Disp: , Rfl:  .  lenalidomide (REVLIMID) 10 MG capsule, Take 1 capsule (10 mg total) by mouth daily. Fanny Dance #[id number]    Date Obtained 08/10/2019, Disp: 28 capsule, Rfl: 0 .  levothyroxine (SYNTHROID) 50 MCG tablet, TAKE 1 TABLET BY MOUTH DAILY BEFORE BREAKFAST, Disp: 90 tablet, Rfl: 3 .  Multiple Vitamins-Minerals (CENTRUM SILVER PO), Take 1 tablet by mouth daily., Disp: , Rfl:  .  triamcinolone (NASACORT ALLERGY 24HR) 55 MCG/ACT AERO nasal inhaler, Place 2 sprays into the nose 2 (two) times daily as needed. , Disp: , Rfl:  .  UNABLE TO FIND, Inject 1 Dose as directed once a  week. Allergy injections once a week , Disp: , Rfl:  .  valACYclovir (VALTREX) 1000 MG tablet, Take 1 tablet (1,000 mg total) by mouth 2 (two) times daily., Disp: 20 tablet, Rfl: 0  Physical exam: Exam limited due to telemedicine  There were no vitals filed for this visit. Physical Exam Constitutional:      General: She is not in acute distress. Skin:    Findings: Rash present.  Neurological:     Mental Status: She is alert.      CMP Latest Ref Rng & Units 08/10/2019  Glucose 70 - 99 mg/dL 86  BUN 8 - 23 mg/dL 19  Creatinine 0.44 - 1.00 mg/dL 1.09(H)  Sodium 135 - 145 mmol/L 137  Potassium 3.5 - 5.1 mmol/L 3.9  Chloride 98 - 111 mmol/L 105  CO2 22 - 32 mmol/L 22  Calcium 8.9 - 10.3 mg/dL 10.9(H)  Total Protein 6.5 - 8.1 g/dL -  Total Bilirubin 0.3 - 1.2 mg/dL -  Alkaline Phos 38 - 126 U/L -  AST 15 - 41 U/L -  ALT 14 - 54 U/L -   CBC Latest Ref Rng & Units 08/10/2019  WBC 4.0 - 10.5 K/uL 5.4  Hemoglobin 12.0 - 15.0 g/dL 10.4(L)  Hematocrit 36.0 - 46.0 % 31.4(L)  Platelets 150 - 400 K/uL 174    No images are attached to the encounter.  No results found.  Assessment and plan-  Patient is a 86 y.o. female diagnosed with multiple myeloma currently on Revlimid who presents to symptom management clinic for skin rash and pruritus.  1.  Skin rash-secondary to Revlimid.  We discussed that rash is a common side effect and occurs in 8-36% of those taking Revlimid and pruritus occurs in roughly 1/3 patients.  Symptoms consistent with grade 1-2 rash.  We discussed that the majority of grade 1-2 rashes can resolve with interruptions in therapy although sometimes treatment will have to be discontinued due to symptoms particularly those that cannot be managed by supportive care.  Continue oral antihistamines until less than grade 1, hold Revlimid, start topical Kenalog, not to apply to face, start prednisone taper pack.  Goal to have symptoms improved to grade 1 or less.  May consider oral steroids prior to rechallenging if patient agrees.  Advised that generally rash occurs in the first 8 weeks of treatment and ideally we would be able to manage her symptoms with antihistamines and topical steroids so that she may continue treatment.  Will however, defer to Dr. Grayland Ormond.  Disposition: Follow-up with Dr. Grayland Ormond as scheduled Return to clinic if symptoms do not improve or worsen in the interim.   Visit Diagnosis 1. Skin changes related to chemotherapy     Patient expressed understanding and was in agreement with this plan. She also understands that She can call clinic at any time with any questions, concerns, or complaints.   I discussed the assessment and treatment plan with the patient. The patient was provided an opportunity to ask questions and all were answered. The patient agreed with the plan and demonstrated an understanding of the instructions.   The patient was advised to call back or seek an in-person evaluation if the symptoms worsen or if the condition fails to improve as anticipated.   I provided 20 minutes of face-to-face video visit time during this encounter, and >  50% was spent counseling as documented under my assessment & plan.  Thank you for allowing me to participate in the care of this  very pleasant patient.   Beckey Rutter, DNP, AGNP-C [facility]: Dr. Grayland Ormond

## 2019-08-27 NOTE — Telephone Encounter (Signed)
Continue to hold revlimid and see [facility].

## 2019-08-28 DIAGNOSIS — J301 Allergic rhinitis due to pollen: Secondary | ICD-10-CM | POA: Diagnosis not present

## 2019-08-31 DIAGNOSIS — H40153 Residual stage of open-angle glaucoma, bilateral: Secondary | ICD-10-CM | POA: Diagnosis not present

## 2019-09-04 DIAGNOSIS — J301 Allergic rhinitis due to pollen: Secondary | ICD-10-CM | POA: Diagnosis not present

## 2019-09-05 NOTE — Progress Notes (Signed)
[place]  Telephone:(336) [phone] Fax:(336) [phone]  ID: [name] OB: September 10, 1933  MR#: [id number]  YOK#:[id number]  Patient Care Team: Lavera Guise, MD as PCP - General (Internal Medicine) Lloyd Huger, MD as Consulting Physician (Oncology)  CHIEF COMPLAINT:  Multiple myeloma in relapse.  INTERVAL HISTORY: Patient returns to clinic today for further evaluation and treatment planning.  She developed a significant rash with Revlimid, but this has since resolved since discontinuing treatment.  She currently feels well and is asymptomatic. She has a chronic, mild peripheral neuropathy that is unchanged and does not affect her day-to-day activity. She has no other neurologic complaints. She denies any recent fevers or illnesses. She has a good appetite and denies weight loss.  She denies any chest pain, shortness of breath, cough, or hemoptysis.  She denies any nausea, vomiting, constipation, or diarrhea.  She has no hematuria, melena, or hematochezia. She has no urinary complaints.  Patient offers no further specific complaints today.    REVIEW OF SYSTEMS:   Review of Systems  Constitutional: Negative.  Negative for fever, malaise/fatigue and weight loss.  HENT: Negative for congestion and sore throat.   Respiratory: Negative.  Negative for cough and shortness of breath.   Cardiovascular: Negative.  Negative for chest pain, palpitations and leg swelling.  Gastrointestinal: Negative.  Negative for abdominal pain, blood in stool, constipation, diarrhea, melena, nausea and vomiting.  Genitourinary: Negative.  Negative for dysuria.  Musculoskeletal: Negative.  Negative for back pain.  Skin: Negative.  Negative for rash.  Neurological: Positive for tingling and sensory change. Negative for focal weakness and weakness.  Psychiatric/Behavioral: Negative.  The patient is not nervous/anxious and does not have insomnia.     As per HPI. Otherwise, a complete review  of systems is negative.  PAST MEDICAL HISTORY: Past Medical History:  Diagnosis Date  . Arthritis    hands  . Benign neoplasm of ascending colon   . Cataract   . GERD (gastroesophageal reflux disease)   . Hyperlipemia   . Hypertension 01/31/2015  . Hypothyroidism   . Multiple myeloma ([place]) 04/18/2016  . Multiple myeloma in remission ([place]) 04/18/2016  . Osteoporosis   . Shingles   . Skin cancer    basal cell    PAST SURGICAL HISTORY: Past Surgical History:  Procedure Laterality Date  . BREAST EXCISIONAL BIOPSY Right 15+ yrs ago   EXCISIONAL - NEG  . CATARACT EXTRACTION W/ INTRAOCULAR LENS IMPLANT    . CHOLECYSTECTOMY N/A 03/14/2017   Procedure: LAPAROSCOPIC CHOLECYSTECTOMY;  Surgeon: Florene Glen, MD;  Location: [facility];  Service: General;  Laterality: N/A;  . COLONOSCOPY WITH PROPOFOL N/A 02/21/2015   Procedure: COLONOSCOPY WITH PROPOFOL;  Surgeon: Lucilla Lame, MD;  Location: [place];  Service: Endoscopy;  Laterality: N/A;  . [place]  . POLYPECTOMY  02/21/2015   Procedure: POLYPECTOMY;  Surgeon: Lucilla Lame, MD;  Location: [place];  Service: Endoscopy;;    FAMILY HISTORY: Family History  Problem Relation Age of Onset  . Heart disease Mother   . Stroke Father   . Heart disease Brother   . Kidney cancer Neg Hx   . Prostate cancer Neg Hx   . Bladder Cancer Neg Hx   . Breast cancer Neg Hx     ADVANCED DIRECTIVES (Y/N):  N  HEALTH MAINTENANCE: Social History   Tobacco Use  . Smoking status: Never Smoker  . Smokeless tobacco: Never Used  Substance Use Topics  .  Alcohol use: No    Alcohol/week: 0.0 standard drinks  . Drug use: No     Colonoscopy:  PAP:  Bone density:  Lipid panel:  No Known Allergies  Current Outpatient Medications  Medication Sig Dispense Refill  . aspirin EC 81 MG tablet Take 81 mg by mouth daily.    . Cyanocobalamin (B-12 PO) Take 500 mcg by mouth daily.     Marland Kitchen EPINEPHrine 0.3 mg/0.3  mL IJ SOAJ injection     . fexofenadine (ALLEGRA) 30 MG tablet Take 30 mg by mouth daily.     Marland Kitchen lenalidomide (REVLIMID) 10 MG capsule Take 1 capsule (10 mg total) by mouth daily. [employer] Auth #[id number]    Date Obtained 08/10/2019 28 capsule 0  . levothyroxine (SYNTHROID) 50 MCG tablet TAKE 1 TABLET BY MOUTH DAILY BEFORE BREAKFAST 90 tablet 3  . Multiple Vitamins-Minerals (CENTRUM SILVER PO) Take 1 tablet by mouth daily.    . predniSONE (STERAPRED UNI-PAK 21 TAB) 10 MG (21) TBPK tablet Day 1: 2 tab before breakfast, 1 after lunch, 1 after dinner, & 2 at bedtime Day 2: 1 tab before breakfast, 1 after lunch, 1 after dinner, & 2 at bedtime Day 3: 1 tab before breakfast, 1 after lunch, 1 after dinner, & 1 at bedtime Day 4: 1 tab before breakfast, 1 after lunch, & 1 at bedtime Day 5: 1 tab before breakfast & 1 at bedtime Day 6: 1 tablet before breakfast 21 tablet 0  . triamcinolone (NASACORT ALLERGY 24HR) 55 MCG/ACT AERO nasal inhaler Place 2 sprays into the nose 2 (two) times daily as needed.     . triamcinolone cream (KENALOG) 0.1 % Apply 1 application topically 2 (two) times daily. 80 g 0  . UNABLE TO FIND Inject 1 Dose as directed once a week. Allergy injections once a week     . valACYclovir (VALTREX) 1000 MG tablet Take 1 tablet (1,000 mg total) by mouth 2 (two) times daily. 20 tablet 0   No current facility-administered medications for this visit.    OBJECTIVE: Vitals:   09/08/19 1042  BP: 124/61  Pulse: 67  Resp: 18  Temp: 98 F (36.7 C)  SpO2: 100%     Body mass index is 23.83 kg/m.    ECOG FS:0 - Asymptomatic  General: Thin, no acute distress. Eyes: Pink conjunctiva, anicteric sclera. HEENT: Normocephalic, moist mucous membranes. Lungs: No audible wheezing or coughing. Heart: Regular rate and rhythm. Abdomen: Soft, nontender, no obvious distention. Musculoskeletal: No edema, cyanosis, or clubbing. Neuro: Alert, answering all questions appropriately. Cranial nerves grossly  intact. Skin: No rashes or petechiae noted. Psych: Normal affect.   LAB RESULTS:  Lab Results  Component Value Date   NA 139 09/08/2019   K 3.5 09/08/2019   CL 111 09/08/2019   CO2 22 09/08/2019   GLUCOSE 94 09/08/2019   BUN 19 09/08/2019   CREATININE 0.94 09/08/2019   CALCIUM 9.6 09/08/2019   PROT 6.5 03/04/2017   ALBUMIN 4.2 03/04/2017   AST 24 03/04/2017   ALT 16 03/04/2017   ALKPHOS 49 03/04/2017   BILITOT 0.5 03/04/2017   GFRNONAA 56 (L) 09/08/2019   GFRAA >60 09/08/2019    Lab Results  Component Value Date   WBC 5.1 09/08/2019   NEUTROABS 2.6 09/08/2019   HGB 10.5 (L) 09/08/2019   HCT 31.1 (L) 09/08/2019   MCV 98.4 09/08/2019   PLT 187 09/08/2019   Lab Results  Component Value Date   TOTALPROTELP 6.1 08/10/2019   ALBUMINELP  4.3 08/10/2019   A1GS 0.2 08/10/2019   A2GS 0.5 08/10/2019   BETS 0.7 08/10/2019   GAMS 0.4 08/10/2019   MSPIKE 0.2 (H) 08/10/2019   SPEI Comment 08/10/2019     STUDIES: No results found.  ASSESSMENT:  Multiple myeloma in relapse.  PLAN:    1.  Multiple myeloma in relapse: Bone marrow biopsy on April 09, 2016 revealed 44% plasma cells and normal cytogenetics. Given the results of her metastatic bone survey on March 12, 2016, her elevated lambda free chains, and hypercalcemia, patient fit the criteria for multiple myeloma and was initially treated with single agent Revlimid. Patient's M spike appears to fluctuate between 0.0 and 0.2 with her most recent results on June 15, 2019 reported 0.2. IgG, IgM, and IgA are all mildly decreased and unchanged.  Lambda free light chains continue to increase and is now 4668.1.  Metastatic bone survey on February 12, 2019 revealed new small lesions in her calvarium.  Hemoglobin has trended down slightly to 10.5.  Calcium levels are within normal limits today.  Given patient's progression of disease, she was placed on low-dose Revlimid again, but this time developed a rash and treatment had to be  discontinued.  Patient will prefer oral treatment, therefore we agreed on low-dose pomalidomide 2 mg daily for 21 days with a 7-day break.  Unclear if there is any cross-reactivity with Revlimid.  If patient develops side effects to the pomalidomide reticular rash, will discontinue and pursue weekly Velcade for treatment.  Return to clinic in 4 weeks for further evaluation.   2. Hypercalcemia: Calcium levels are within normal limits today.  Patient initiated monthly Zometa on May 07, 2016.  Patient last received Zometa on July 13, 2019. 3.  Anemia: Chronic and unchanged.  Patient's hemoglobin is 10.5 today. 4.  Peripheral neuropathy: Patient was previously given a referral to acupuncture which she said did not help much.   I spent a total of 30 minutes reviewing chart data, face-to-face evaluation with the patient, counseling and coordination of care as detailed above.   Patient expressed understanding and was in agreement with this plan. She also understands that She can call clinic at any time with any questions, concerns, or complaints.    Lloyd Huger, MD 09/08/19 12:46 PM

## 2019-09-08 ENCOUNTER — Inpatient Hospital Stay: Payer: Medicare PPO

## 2019-09-08 ENCOUNTER — Encounter: Payer: Self-pay | Admitting: Oncology

## 2019-09-08 ENCOUNTER — Inpatient Hospital Stay (HOSPITAL_BASED_OUTPATIENT_CLINIC_OR_DEPARTMENT_OTHER): Payer: Medicare PPO | Admitting: Oncology

## 2019-09-08 ENCOUNTER — Other Ambulatory Visit: Payer: Self-pay | Admitting: Oncology

## 2019-09-08 VITALS — BP 124/61 | HR 67 | Temp 98.0°F | Resp 18 | Wt 130.3 lb

## 2019-09-08 DIAGNOSIS — M1990 Unspecified osteoarthritis, unspecified site: Secondary | ICD-10-CM | POA: Diagnosis not present

## 2019-09-08 DIAGNOSIS — G629 Polyneuropathy, unspecified: Secondary | ICD-10-CM | POA: Diagnosis not present

## 2019-09-08 DIAGNOSIS — D649 Anemia, unspecified: Secondary | ICD-10-CM | POA: Diagnosis not present

## 2019-09-08 DIAGNOSIS — C9000 Multiple myeloma not having achieved remission: Secondary | ICD-10-CM | POA: Diagnosis not present

## 2019-09-08 DIAGNOSIS — E039 Hypothyroidism, unspecified: Secondary | ICD-10-CM | POA: Diagnosis not present

## 2019-09-08 DIAGNOSIS — K219 Gastro-esophageal reflux disease without esophagitis: Secondary | ICD-10-CM | POA: Diagnosis not present

## 2019-09-08 DIAGNOSIS — E785 Hyperlipidemia, unspecified: Secondary | ICD-10-CM | POA: Diagnosis not present

## 2019-09-08 DIAGNOSIS — C9001 Multiple myeloma in remission: Secondary | ICD-10-CM

## 2019-09-08 DIAGNOSIS — C9002 Multiple myeloma in relapse: Secondary | ICD-10-CM | POA: Diagnosis not present

## 2019-09-08 DIAGNOSIS — I10 Essential (primary) hypertension: Secondary | ICD-10-CM | POA: Diagnosis not present

## 2019-09-08 LAB — CBC WITH DIFFERENTIAL/PLATELET
Abs Immature Granulocytes: 0.04 10*3/uL (ref 0.00–0.07)
Basophils Absolute: 0.1 10*3/uL (ref 0.0–0.1)
Basophils Relative: 1 %
Eosinophils Absolute: 0 10*3/uL (ref 0.0–0.5)
Eosinophils Relative: 1 %
HCT: 31.1 % — ABNORMAL LOW (ref 36.0–46.0)
Hemoglobin: 10.5 g/dL — ABNORMAL LOW (ref 12.0–15.0)
Immature Granulocytes: 1 %
Lymphocytes Relative: 25 %
Lymphs Abs: 1.3 10*3/uL (ref 0.7–4.0)
MCH: 33.2 pg (ref 26.0–34.0)
MCHC: 33.8 g/dL (ref 30.0–36.0)
MCV: 98.4 fL (ref 80.0–100.0)
Monocytes Absolute: 1 10*3/uL (ref 0.1–1.0)
Monocytes Relative: 20 %
Neutro Abs: 2.6 10*3/uL (ref 1.7–7.7)
Neutrophils Relative %: 52 %
Platelets: 187 10*3/uL (ref 150–400)
RBC: 3.16 MIL/uL — ABNORMAL LOW (ref 3.87–5.11)
RDW: 13.2 % (ref 11.5–15.5)
WBC: 5.1 10*3/uL (ref 4.0–10.5)
nRBC: 0 % (ref 0.0–0.2)

## 2019-09-08 LAB — BASIC METABOLIC PANEL
Anion gap: 6 (ref 5–15)
BUN: 19 mg/dL (ref 8–23)
CO2: 22 mmol/L (ref 22–32)
Calcium: 9.6 mg/dL (ref 8.9–10.3)
Chloride: 111 mmol/L (ref 98–111)
Creatinine, Ser: 0.94 mg/dL (ref 0.44–1.00)
GFR calc Af Amer: >60 mL/min (ref >60)
GFR calc non Af Amer: 56 mL/min — ABNORMAL LOW (ref >60)
Glucose, Bld: 94 mg/dL (ref 70–99)
Potassium: 3.5 mmol/L (ref 3.5–5.1)
Sodium: 139 mmol/L (ref 135–145)

## 2019-09-08 MED ORDER — POMALIDOMIDE 2 MG PO CAPS: 2.0000 mg | ORAL_CAPSULE | Freq: Every day | ORAL | 5 refills | Status: DC

## 2019-09-09 ENCOUNTER — Telehealth: Payer: Self-pay | Admitting: Pharmacist

## 2019-09-09 ENCOUNTER — Ambulatory Visit: Payer: Medicare PPO | Admitting: Dermatology

## 2019-09-09 ENCOUNTER — Encounter: Payer: Self-pay | Admitting: Dermatology

## 2019-09-09 ENCOUNTER — Other Ambulatory Visit: Payer: Self-pay

## 2019-09-09 DIAGNOSIS — L578 Other skin changes due to chronic exposure to nonionizing radiation: Secondary | ICD-10-CM

## 2019-09-09 DIAGNOSIS — D485 Neoplasm of uncertain behavior of skin: Secondary | ICD-10-CM

## 2019-09-09 DIAGNOSIS — C9002 Multiple myeloma in relapse: Secondary | ICD-10-CM | POA: Diagnosis not present

## 2019-09-09 DIAGNOSIS — Z85820 Personal history of malignant melanoma of skin: Secondary | ICD-10-CM

## 2019-09-09 DIAGNOSIS — Z85828 Personal history of other malignant neoplasm of skin: Secondary | ICD-10-CM | POA: Diagnosis not present

## 2019-09-09 DIAGNOSIS — C44329 Squamous cell carcinoma of skin of other parts of face: Secondary | ICD-10-CM

## 2019-09-09 DIAGNOSIS — C9000 Multiple myeloma not having achieved remission: Secondary | ICD-10-CM

## 2019-09-09 LAB — PROTEIN ELECTROPHORESIS, SERUM
A/G Ratio: 1.9 — ABNORMAL HIGH (ref 0.7–1.7)
Albumin ELP: 4 g/dL (ref 2.9–4.4)
Alpha-1-Globulin: 0.2 g/dL (ref 0.0–0.4)
Alpha-2-Globulin: 0.6 g/dL (ref 0.4–1.0)
Beta Globulin: 0.8 g/dL (ref 0.7–1.3)
Gamma Globulin: 0.5 g/dL (ref 0.4–1.8)
Globulin, Total: 2.1 g/dL — ABNORMAL LOW (ref 2.2–3.9)
M-Spike, %: 0.2 g/dL — ABNORMAL HIGH
Total Protein ELP: 6.1 g/dL (ref 6.0–8.5)

## 2019-09-09 LAB — IGG, IGA, IGM
IgA: 15 mg/dL — ABNORMAL LOW (ref 64–422)
IgG (Immunoglobin G), Serum: 455 mg/dL — ABNORMAL LOW (ref 586–1602)
IgM (Immunoglobulin M), Srm: 9 mg/dL — ABNORMAL LOW (ref 26–217)

## 2019-09-09 LAB — KAPPA/LAMBDA LIGHT CHAINS
Kappa free light chain: 13.1 mg/L (ref 3.3–19.4)
Kappa, lambda light chain ratio: 0 — ABNORMAL LOW (ref 0.26–1.65)
Lambda free light chains: 5600.9 mg/L — ABNORMAL HIGH (ref 5.7–26.3)

## 2019-09-09 MED ORDER — POMALIDOMIDE 2 MG PO CAPS: 2.0000 mg | ORAL_CAPSULE | Freq: Every day | ORAL | 0 refills | Status: DC

## 2019-09-09 NOTE — Progress Notes (Signed)
Follow-Up Visit   Subjective  Rhonda [name] is a 86 y.o. female who presents for the following: New lesion (lesion has been there for a few months now. It is scabbed, crusted, red, and will not resolve. ).  The following portions of the chart were reviewed this encounter and updated as appropriate:    Review of Systems: No other skin or systemic complaints.  Objective  Well appearing patient in no apparent distress; mood and affect are within normal limits.  A focused examination was performed including the face . Relevant physical exam findings are noted in the Assessment and Plan.  Objective  Face, trunk, extremities: Diffuse scaly erythematous macules with underlying dyspigmentation.   Objective  R cheek lat to mid nasolabial area: 0.6 cm hyperkeratotic papule  Assessment & Plan  Actinic skin damage Face, trunk, extremities  Recommend daily broad spectrum sunscreen SPF 30+ to sun-exposed areas, reapply every 2 hours as needed. Call for new or changing lesions.   History of SCC (squamous cell carcinoma) of skin L pretibial, R lat sup ankle, L med lower leg, R prox forearm  Will recheck at follow up appointment in one month for all over skin exam.  History of melanoma R mid to distal ant lat thigh  Will recheck at next follow up appointment in one month for all over skin exam.   Multiple myeloma in relapse [facility]) internal  Patient was in remission for about 2 years. Recently she restarted Revlimid but has since had a reaction to the medication twice. She plans to start Pomalyst when/if approved by her insurance.  Neoplasm of uncertain behavior of skin R cheek lat to mid nasolabial area  Epidermal / dermal shaving  Lesion length (cm):  0.6 Lesion width (cm):  0.6 Margin per side (cm):  0.2 Total excision diameter (cm):  1 Informed consent: discussed and consent obtained   Timeout: patient name, date of birth, surgical site, and procedure verified     Procedure prep:  Patient was prepped and draped in usual sterile fashion Prep type:  Isopropyl alcohol Anesthesia: the lesion was anesthetized in a standard fashion   Anesthetic:  1% lidocaine w/ epinephrine 1-100,000 buffered w/ 8.4% NaHCO3 Instrument used: flexible razor blade   Hemostasis achieved with: pressure, aluminum chloride and electrodesiccation   Outcome: patient tolerated procedure well   Post-procedure details: sterile dressing applied and wound care instructions given   Dressing type: bandage and petrolatum    Destruction of lesion Complexity: extensive   Destruction method: electrodesiccation and curettage   Informed consent: discussed and consent obtained   Timeout:  patient name, date of birth, surgical site, and procedure verified Procedure prep:  Patient was prepped and draped in usual sterile fashion Prep type:  Isopropyl alcohol Anesthesia: the lesion was anesthetized in a standard fashion   Anesthetic:  1% lidocaine w/ epinephrine 1-100,000 buffered w/ 8.4% NaHCO3 Curettage performed in three different directions: Yes   Electrodesiccation performed over the curetted area: Yes   Lesion length (cm):  0.6 Lesion width (cm):  0.6 Margin per side (cm):  0.2 Final wound size (cm):  1 Hemostasis achieved with:  pressure, aluminum chloride and electrodesiccation Outcome: patient tolerated procedure well with no complications   Post-procedure details: sterile dressing applied and wound care instructions given   Dressing type: bandage and petrolatum    Specimen 1 - Surgical pathology Differential Diagnosis: D48.5 r/o SCC - ED&C today  Check Margins: No 0.6 cm hyperkeratotic papule  Return in about 1  month (around 10/09/2019) for TBSE.  Tanya Nones, CMA, am acting as scribe for Sarina Ser, MD .

## 2019-09-09 NOTE — Telephone Encounter (Signed)
Oral Oncology Pharmacist Encounter  Received new prescription for Pomalyst (pomalidomide) for the treatment of relapsed multiple myeloma, planned duration until disease progression or unacceptable drug toxicity.  Prescription dose and frequency assessed.   Current medication list in Epic reviewed, no relevant DDIs with pomalidomide identified.   Prescription has been e-scribed to [facility] for benefits analysis and approval.  Oral Oncology Clinic will continue to follow for insurance authorization, copayment issues, initial counseling and start date.  Darl Pikes, PharmD, BCPS, BCOP, CPP Hematology/Oncology Clinical Pharmacist [facility] Oral [place] [facility] [phone]  09/09/2019 1:43 PM

## 2019-09-09 NOTE — Telephone Encounter (Signed)
Oral Oncology Pharmacist Encounter   Prior Authorization for Pomalyst submitted through [facility]. Per CMM available without authorization.   Oral Oncology Clinic will continue to follow.   Darl Pikes, PharmD, BCPS. BCOP Hematology/Oncology Clinical Pharmacist [facility] Oral [facility] [phone]  09/09/2019 2:31 PM

## 2019-09-15 ENCOUNTER — Telehealth: Payer: Self-pay

## 2019-09-15 NOTE — Telephone Encounter (Signed)
Discussed biopsy results with pt  °

## 2019-09-16 DIAGNOSIS — J301 Allergic rhinitis due to pollen: Secondary | ICD-10-CM | POA: Diagnosis not present

## 2019-09-18 DIAGNOSIS — J301 Allergic rhinitis due to pollen: Secondary | ICD-10-CM | POA: Diagnosis not present

## 2019-09-23 ENCOUNTER — Ambulatory Visit: Payer: Medicare PPO | Admitting: Dermatology

## 2019-09-25 DIAGNOSIS — J301 Allergic rhinitis due to pollen: Secondary | ICD-10-CM | POA: Diagnosis not present

## 2019-09-27 ENCOUNTER — Other Ambulatory Visit: Payer: Self-pay | Admitting: Oncology

## 2019-09-27 DIAGNOSIS — C9000 Multiple myeloma not having achieved remission: Secondary | ICD-10-CM

## 2019-10-02 DIAGNOSIS — J301 Allergic rhinitis due to pollen: Secondary | ICD-10-CM | POA: Diagnosis not present

## 2019-10-03 NOTE — Progress Notes (Signed)
Rhonda [name]  [phone] [phone]) [phone]  ID: [name] OB: 08-06-1933  MR#: [id number]  UYQ#:[id number]  Patient Care Team: Lavera Guise, MD as PCP - General (Internal Medicine) Lloyd Huger, MD as Consulting Physician (Oncology)  CHIEF COMPLAINT:  Multiple myeloma in relapse.  INTERVAL HISTORY: Patient returns to clinic today for further evaluation and assess her toleration of pomalidomide.  She had some increased constipation and abdominal bloating, but otherwise tolerated her treatment well.  She completed her 21-day course several days ago.  She has a chronic, mild peripheral neuropathy that is unchanged and does not affect her day-to-day activity. She has no other neurologic complaints. She denies any recent fevers or illnesses. She has a good appetite and denies weight loss.  She denies any chest pain, shortness of breath, cough, or hemoptysis.  She denies any nausea, vomiting, or diarrhea.  She has no melena or hematochezia.  She has no urinary complaints.  Patient offers no further specific complaints today.  REVIEW OF SYSTEMS:   Review of Systems  Constitutional: Negative.  Negative for fever, malaise/fatigue and weight loss.  HENT: Negative for congestion and sore throat.   Respiratory: Negative.  Negative for cough and shortness of breath.   Cardiovascular: Negative.  Negative for chest pain, palpitations and leg swelling.  Gastrointestinal: Positive for constipation. Negative for abdominal pain, blood in stool, diarrhea, melena, nausea and vomiting.  Genitourinary: Negative.  Negative for dysuria.  Musculoskeletal: Negative.  Negative for back pain.  Skin: Negative.  Negative for rash.  Neurological: Positive for tingling and sensory change. Negative for focal weakness and weakness.  Psychiatric/Behavioral: Negative.  The patient is not nervous/anxious and does not have insomnia.     As per HPI. Otherwise, a complete review of  systems is negative.  PAST MEDICAL HISTORY: Past Medical History:  Diagnosis Date   Actinic keratosis    Arthritis    hands   Benign neoplasm of ascending colon    Cataract    GERD (gastroesophageal reflux disease)    Hyperlipemia    Hypertension 01/31/2015   Hypothyroidism    Melanoma ([place]) 04/28/2018   R mid to distal ant lat thigh. MM, SS, tumor thickness 0.57m, antatomic level II. Excised: 05/13/18, margins free   [place]   [place]   Osteoporosis    Shingles    Skin cancer    basal cell   Squamous cell carcinoma of skin 02/04/2014   Left pretibial. KA-like pattern   Squamous cell carcinoma of skin 09/15/2015   Right lat. superior ankle are. Superficial infiltration. Tx: EDC   Squamous cell carcinoma of skin 10/15/2016   Left medial mid lower leg. Tx: EDC   Squamous cell carcinoma of skin 11/27/2018   Right dorsum proximal forearm.   Squamous cell carcinoma of skin 09/09/2019   Right cheek lat. to mid nasolabial area. Tx: EDC    PAST SURGICAL HISTORY: Past Surgical History:  Procedure Laterality Date   BREAST EXCISIONAL BIOPSY Right 15+ yrs ago   EXCISIONAL - NEG   CATARACT EXTRACTION W/ INTRAOCULAR LENS IMPLANT     CHOLECYSTECTOMY N/A 03/14/2017   Procedure: LAPAROSCOPIC CHOLECYSTECTOMY;  Surgeon: CFlorene Glen MD;  Location: [facility];  Service: General;  Laterality: N/A;   COLONOSCOPY WITH PROPOFOL N/A 02/21/2015   Procedure: COLONOSCOPY WITH PROPOFOL;  Surgeon: DLucilla Lame MD;  [facility]  Service: Endoscopy;  Laterality: N/A;   [place]  POLYPECTOMY  02/21/2015   Procedure: POLYPECTOMY;  Surgeon: Lucilla Lame, MD;  Location: [facility];  Service: Endoscopy;;    FAMILY HISTORY: Family History  Problem Relation Age of Onset   Heart disease Mother    Stroke Father    Heart disease Brother    Kidney cancer Neg Hx     Prostate cancer Neg Hx    Bladder Cancer Neg Hx    Breast cancer Neg Hx     ADVANCED DIRECTIVES (Y/N):  N  HEALTH MAINTENANCE: Social History   Tobacco Use   Smoking status: Never Smoker   Smokeless tobacco: Never Used  Substance Use Topics   Alcohol use: No    Alcohol/week: 0.0 standard drinks   Drug use: No     Colonoscopy:  PAP:  Bone density:  Lipid panel:  Allergies  Allergen Reactions   Revlimid [Lenalidomide]     Current Outpatient Medications  Medication Sig Dispense Refill   aspirin EC 81 MG tablet Take 81 mg by mouth daily.     Cyanocobalamin (B-12 PO) Take 500 mcg by mouth daily.      EPINEPHrine 0.3 mg/0.3 mL IJ SOAJ injection      fexofenadine (ALLEGRA) 30 MG tablet Take 30 mg by mouth daily.      levothyroxine (SYNTHROID) 50 MCG tablet TAKE 1 TABLET BY MOUTH DAILY BEFORE BREAKFAST 90 tablet 3   Multiple Vitamins-Minerals (CENTRUM SILVER PO) Take 1 tablet by mouth daily.     POMALYST 2 MG capsule TAKE 1 CAPSULE (2MG TOTAL) BY MOUTH EVERY DAY FOR 21 DAYS, THEN HOLD FOR 7 DAYS 21 capsule 0   triamcinolone (NASACORT ALLERGY 24HR) 55 MCG/ACT AERO nasal inhaler Place 2 sprays into the nose 2 (two) times daily as needed.      UNABLE TO FIND Inject 1 Dose as directed once a week. Allergy injections once a week      No current facility-administered medications for this visit.    OBJECTIVE: Vitals:   10/06/19 1031  BP: (!) 114/49  Pulse: 66  Resp: 18  Temp: 97.7 F (36.5 C)  SpO2: 100%     Body mass index is 23.19 kg/m.    ECOG FS:0 - Asymptomatic  General: Thin, no acute distress. Eyes: Pink conjunctiva, anicteric sclera. HEENT: Normocephalic, moist mucous membranes. Lungs: No audible wheezing or coughing. Heart: Regular rate and rhythm. Abdomen: Soft, nontender, no obvious distention. Musculoskeletal: No edema, cyanosis, or clubbing. Neuro: Alert, answering all questions appropriately. Cranial nerves grossly intact. Skin: No  rashes or petechiae noted. Psych: Normal affect.  LAB RESULTS:  Lab Results  Component Value Date   NA 136 10/06/2019   K 3.9 10/06/2019   CL 103 10/06/2019   CO2 24 10/06/2019   GLUCOSE 112 (H) 10/06/2019   BUN 22 10/06/2019   CREATININE 1.11 (H) 10/06/2019   CALCIUM 11.1 (H) 10/06/2019   PROT 6.5 03/04/2017   ALBUMIN 4.2 03/04/2017   AST 24 03/04/2017   ALT 16 03/04/2017   ALKPHOS 49 03/04/2017   BILITOT 0.5 03/04/2017   GFRNONAA 46 (L) 10/06/2019   GFRAA 53 (L) 10/06/2019    Lab Results  Component Value Date   WBC 2.8 (L) 10/06/2019   NEUTROABS 0.9 (L) 10/06/2019   HGB 11.3 (L) 10/06/2019   HCT 33.7 (L) 10/06/2019   MCV 98.3 10/06/2019   PLT 139 (L) 10/06/2019   Lab Results  Component Value Date   TOTALPROTELP 6.1 09/08/2019   ALBUMINELP 4.0 09/08/2019   A1GS 0.2  09/08/2019   A2GS 0.6 09/08/2019   BETS 0.8 09/08/2019   GAMS 0.5 09/08/2019   MSPIKE 0.2 (H) 09/08/2019   SPEI Comment 09/08/2019     STUDIES: No results found.  ASSESSMENT:  Multiple myeloma in relapse.  PLAN:    1.  Multiple myeloma in relapse: Bone marrow biopsy on April 09, 2016 revealed 44% plasma cells and normal cytogenetics. Given the results of her metastatic bone survey on March 12, 2016, her elevated lambda free chains, and hypercalcemia, patient fit the criteria for multiple myeloma and was initially treated with single agent Revlimid. Patient's M spike appears to fluctuate between 0.0 and 0.2 with her most recent results on June 15, 2019 reported 0.2. IgG, IgM, and IgA are all mildly decreased and unchanged.  Lambda free light chains continue to increase and are now 4668.1, today's result are pending.  Metastatic bone survey on February 12, 2019 revealed new small lesions in her calvarium.  Hemoglobin has trended up slightly to 11.3.  Calcium levels are increased to 11.1.  Calcium levels are within normal limits today.  Patient could not tolerate Revlimid and was put on pomalidomide  2 mg daily for 21 days with a 7-day break.  If patient develops side effects to the pomalidomide, will discontinue and pursue weekly Velcade for treatment.  Return to clinic on November 10, 2019 for further evaluation and initiation of cycle 3 of pomalidomide. 2. Hypercalcemia: Patient's calcium levels have trended up today.  Patient last received Zometa on July 13, 2019.  Will add Zometa to next clinic appointment. 3.  Anemia: Slightly improved.  Patient's hemoglobin is 11.3 today. 4.  Leukopenia: Secondary to pomalidomide.  Continue treatment as above. 5.  Thrombocytopenia: Mild, monitor. 6.  Peripheral neuropathy: Patient was previously given a referral to acupuncture which she said did not help much.  7.  Constipation: Patient has been instructed to take either OTC stool softeners or Metamucil.  She can also use MiraLAX as needed.  Patient expressed understanding and was in agreement with this plan. She also understands that She can call clinic at any time with any questions, concerns, or complaints.    Lloyd Huger, MD 10/06/19 2:36 PM

## 2019-10-05 ENCOUNTER — Other Ambulatory Visit: Payer: Self-pay

## 2019-10-05 ENCOUNTER — Encounter: Payer: Self-pay | Admitting: Oncology

## 2019-10-05 NOTE — Progress Notes (Signed)
Patient prescreened for appointment. Patient has no concerns or questions.  

## 2019-10-06 ENCOUNTER — Encounter: Payer: Self-pay | Admitting: Oncology

## 2019-10-06 ENCOUNTER — Inpatient Hospital Stay: Payer: Medicare PPO | Attending: Oncology

## 2019-10-06 ENCOUNTER — Inpatient Hospital Stay (HOSPITAL_BASED_OUTPATIENT_CLINIC_OR_DEPARTMENT_OTHER): Payer: Medicare PPO | Admitting: Oncology

## 2019-10-06 VITALS — BP 114/49 | HR 66 | Temp 97.7°F | Resp 18 | Wt 126.8 lb

## 2019-10-06 DIAGNOSIS — Z79899 Other long term (current) drug therapy: Secondary | ICD-10-CM | POA: Insufficient documentation

## 2019-10-06 DIAGNOSIS — C9000 Multiple myeloma not having achieved remission: Secondary | ICD-10-CM | POA: Diagnosis not present

## 2019-10-06 DIAGNOSIS — K5900 Constipation, unspecified: Secondary | ICD-10-CM | POA: Insufficient documentation

## 2019-10-06 DIAGNOSIS — K219 Gastro-esophageal reflux disease without esophagitis: Secondary | ICD-10-CM | POA: Diagnosis not present

## 2019-10-06 DIAGNOSIS — C9002 Multiple myeloma in relapse: Secondary | ICD-10-CM | POA: Insufficient documentation

## 2019-10-06 DIAGNOSIS — D649 Anemia, unspecified: Secondary | ICD-10-CM | POA: Insufficient documentation

## 2019-10-06 DIAGNOSIS — R140 Abdominal distension (gaseous): Secondary | ICD-10-CM | POA: Diagnosis not present

## 2019-10-06 DIAGNOSIS — D72819 Decreased white blood cell count, unspecified: Secondary | ICD-10-CM | POA: Diagnosis not present

## 2019-10-06 DIAGNOSIS — E785 Hyperlipidemia, unspecified: Secondary | ICD-10-CM | POA: Diagnosis not present

## 2019-10-06 DIAGNOSIS — Z85820 Personal history of malignant melanoma of skin: Secondary | ICD-10-CM | POA: Diagnosis not present

## 2019-10-06 DIAGNOSIS — G629 Polyneuropathy, unspecified: Secondary | ICD-10-CM | POA: Diagnosis not present

## 2019-10-06 DIAGNOSIS — E039 Hypothyroidism, unspecified: Secondary | ICD-10-CM | POA: Diagnosis not present

## 2019-10-06 DIAGNOSIS — Z7982 Long term (current) use of aspirin: Secondary | ICD-10-CM | POA: Insufficient documentation

## 2019-10-06 DIAGNOSIS — I10 Essential (primary) hypertension: Secondary | ICD-10-CM | POA: Diagnosis not present

## 2019-10-06 DIAGNOSIS — Z8249 Family history of ischemic heart disease and other diseases of the circulatory system: Secondary | ICD-10-CM | POA: Insufficient documentation

## 2019-10-06 DIAGNOSIS — C9001 Multiple myeloma in remission: Secondary | ICD-10-CM

## 2019-10-06 LAB — CBC WITH DIFFERENTIAL/PLATELET
Abs Immature Granulocytes: 0.02 10*3/uL (ref 0.00–0.07)
Basophils Absolute: 0.1 10*3/uL (ref 0.0–0.1)
Basophils Relative: 2 %
Eosinophils Absolute: 0.1 10*3/uL (ref 0.0–0.5)
Eosinophils Relative: 2 %
HCT: 33.7 % — ABNORMAL LOW (ref 36.0–46.0)
Hemoglobin: 11.3 g/dL — ABNORMAL LOW (ref 12.0–15.0)
Immature Granulocytes: 1 %
Lymphocytes Relative: 38 %
Lymphs Abs: 1.1 10*3/uL (ref 0.7–4.0)
MCH: 32.9 pg (ref 26.0–34.0)
MCHC: 33.5 g/dL (ref 30.0–36.0)
MCV: 98.3 fL (ref 80.0–100.0)
Monocytes Absolute: 0.7 10*3/uL (ref 0.1–1.0)
Monocytes Relative: 25 %
Neutro Abs: 0.9 10*3/uL — ABNORMAL LOW (ref 1.7–7.7)
Neutrophils Relative %: 32 %
Platelets: 139 10*3/uL — ABNORMAL LOW (ref 150–400)
RBC: 3.43 MIL/uL — ABNORMAL LOW (ref 3.87–5.11)
RDW: 12.8 % (ref 11.5–15.5)
WBC: 2.8 10*3/uL — ABNORMAL LOW (ref 4.0–10.5)
nRBC: 0 % (ref 0.0–0.2)

## 2019-10-06 LAB — BASIC METABOLIC PANEL
Anion gap: 9 (ref 5–15)
BUN: 22 mg/dL (ref 8–23)
CO2: 24 mmol/L (ref 22–32)
Calcium: 11.1 mg/dL — ABNORMAL HIGH (ref 8.9–10.3)
Chloride: 103 mmol/L (ref 98–111)
Creatinine, Ser: 1.11 mg/dL — ABNORMAL HIGH (ref 0.44–1.00)
GFR calc Af Amer: 53 mL/min — ABNORMAL LOW (ref >60)
GFR calc non Af Amer: 46 mL/min — ABNORMAL LOW (ref >60)
Glucose, Bld: 112 mg/dL — ABNORMAL HIGH (ref 70–99)
Potassium: 3.9 mmol/L (ref 3.5–5.1)
Sodium: 136 mmol/L (ref 135–145)

## 2019-10-06 NOTE — Progress Notes (Signed)
Patient states today she has been having some severe stomach cramps and constipation. She states it started last Friday and after she goes to bathroom cramps are relieved. She is not sure if maybe medication could be causing cramps. She reports some dizziness and weakness with stomach cramps. Currently taking a stool softener but states it's not helping.

## 2019-10-07 LAB — KAPPA/LAMBDA LIGHT CHAINS
Kappa free light chain: 23.8 mg/L — ABNORMAL HIGH (ref 3.3–19.4)
Kappa, lambda light chain ratio: 0.01 — ABNORMAL LOW (ref 0.26–1.65)
Lambda free light chains: 3178.8 mg/L — ABNORMAL HIGH (ref 5.7–26.3)

## 2019-10-07 LAB — IGG, IGA, IGM
IgA: 20 mg/dL — ABNORMAL LOW (ref 64–422)
IgG (Immunoglobin G), Serum: 462 mg/dL — ABNORMAL LOW (ref 586–1602)
IgM (Immunoglobulin M), Srm: 12 mg/dL — ABNORMAL LOW (ref 26–217)

## 2019-10-07 LAB — PROTEIN ELECTROPHORESIS, SERUM
A/G Ratio: 1.6 (ref 0.7–1.7)
Albumin ELP: 3.7 g/dL (ref 2.9–4.4)
Alpha-1-Globulin: 0.2 g/dL (ref 0.0–0.4)
Alpha-2-Globulin: 0.6 g/dL (ref 0.4–1.0)
Beta Globulin: 0.9 g/dL (ref 0.7–1.3)
Gamma Globulin: 0.5 g/dL (ref 0.4–1.8)
Globulin, Total: 2.3 g/dL (ref 2.2–3.9)
M-Spike, %: 0.2 g/dL — ABNORMAL HIGH
Total Protein ELP: 6 g/dL (ref 6.0–8.5)

## 2019-10-09 DIAGNOSIS — J301 Allergic rhinitis due to pollen: Secondary | ICD-10-CM | POA: Diagnosis not present

## 2019-10-16 DIAGNOSIS — J301 Allergic rhinitis due to pollen: Secondary | ICD-10-CM | POA: Diagnosis not present

## 2019-10-23 DIAGNOSIS — J301 Allergic rhinitis due to pollen: Secondary | ICD-10-CM | POA: Diagnosis not present

## 2019-10-27 ENCOUNTER — Ambulatory Visit: Payer: Medicare PPO | Admitting: Dermatology

## 2019-10-27 ENCOUNTER — Other Ambulatory Visit: Payer: Self-pay

## 2019-10-27 DIAGNOSIS — L570 Actinic keratosis: Secondary | ICD-10-CM | POA: Diagnosis not present

## 2019-10-27 DIAGNOSIS — D229 Melanocytic nevi, unspecified: Secondary | ICD-10-CM | POA: Diagnosis not present

## 2019-10-27 DIAGNOSIS — Z85828 Personal history of other malignant neoplasm of skin: Secondary | ICD-10-CM

## 2019-10-27 DIAGNOSIS — L814 Other melanin hyperpigmentation: Secondary | ICD-10-CM

## 2019-10-27 DIAGNOSIS — L578 Other skin changes due to chronic exposure to nonionizing radiation: Secondary | ICD-10-CM

## 2019-10-27 DIAGNOSIS — L821 Other seborrheic keratosis: Secondary | ICD-10-CM

## 2019-10-27 DIAGNOSIS — D692 Other nonthrombocytopenic purpura: Secondary | ICD-10-CM | POA: Diagnosis not present

## 2019-10-27 DIAGNOSIS — Z85820 Personal history of malignant melanoma of skin: Secondary | ICD-10-CM | POA: Diagnosis not present

## 2019-10-27 DIAGNOSIS — Z1283 Encounter for screening for malignant neoplasm of skin: Secondary | ICD-10-CM

## 2019-10-27 DIAGNOSIS — D1801 Hemangioma of skin and subcutaneous tissue: Secondary | ICD-10-CM | POA: Diagnosis not present

## 2019-10-27 NOTE — Progress Notes (Signed)
Follow-Up Visit   Subjective  Rhonda [name] is a 86 y.o. female who presents for the following: Annual Exam (TBSE today - History of Melanoma of right ant distal lat thigh (2019), History of SCC (multiplte), History of BCC. She is on a chemo pill for Myeloma - on 21 days and off 7 days - she is on second month of treatment). The patient presents for total body skin exam for skin cancer screening and mole check.   The following portions of the chart were reviewed this encounter and updated as appropriate:  Tobacco  Allergies  Meds  Problems  Med Hx  Surg Hx  Fam Hx      Review of Systems:  No other skin or systemic complaints except as noted in HPI or Assessment and Plan.  Objective  Well appearing patient in no apparent distress; mood and affect are within normal limits.  A full examination was performed including scalp, head, eyes, ears, nose, lips, neck, chest, axillae, abdomen, back, buttocks, bilateral upper extremities, bilateral lower extremities, hands, feet, fingers, toes, fingernails, and toenails. All findings within normal limits unless otherwise noted below.  Objective  Bilateral arms (7): Erythematous thin papules/macules with gritty scale.   Objective  Multiple: Well healed scar with no evidence of recurrence, no lymphadenopathy.   Objective  Right mid to distal ant lat thigh: Well healed scar with no evidence of recurrence, no lymphadenopathy.    Assessment & Plan    Lentigines - Scattered tan macules - Discussed due to sun exposure - Benign, observe - Call for any changes  Seborrheic Keratoses - Stuck-on, waxy, tan-brown papules and plaques  - Discussed benign etiology and prognosis. - Observe - Call for any changes  Melanocytic Nevi - Tan-brown and/or pink-flesh-colored symmetric macules and papules - Benign appearing on exam today - Observation - Call clinic for new or changing moles - Recommend daily use of broad spectrum spf 30+  sunscreen to sun-exposed areas.   Hemangiomas - Red papules - Discussed benign nature - Observe - Call for any changes  Actinic Damage - diffuse scaly erythematous macules with underlying dyspigmentation - Recommend daily broad spectrum sunscreen SPF 30+ to sun-exposed areas, reapply every 2 hours as needed.  - Call for new or changing lesions.  Skin cancer screening performed today.  Purpura - Violaceous macules and patches - Benign - Related to age, sun damage and/or use of blood thinners - Observe - Can use OTC arnica containing moisturizer such as Dermend Bruise Formula if desired - Call for worsening or other concerns  .qkhist AK (actinic keratosis) (7) Bilateral arms  Destruction of lesion - Bilateral arms Complexity: simple   Destruction method: cryotherapy   Informed consent: discussed and consent obtained   Timeout:  patient name, date of birth, surgical site, and procedure verified Lesion destroyed using liquid nitrogen: Yes   Region frozen until ice ball extended beyond lesion: Yes   Outcome: patient tolerated procedure well with no complications   Post-procedure details: wound care instructions given    History of SCC (squamous cell carcinoma) of skin Multiple  Clear today. Observe   History of melanoma Right mid to distal ant lat thigh  Clear today. Observe   Skin cancer screening  Return in about 4 months (around 02/27/2020).   I, Ashok Cordia, CMA, am acting as scribe for Sarina Ser, MD .  Documentation: I have reviewed the above documentation for accuracy and completeness, and I agree with the above.  Sarina Ser, MD

## 2019-10-27 NOTE — Patient Instructions (Signed)

## 2019-10-28 ENCOUNTER — Encounter: Payer: Self-pay | Admitting: Dermatology

## 2019-10-29 ENCOUNTER — Other Ambulatory Visit: Payer: Self-pay | Admitting: Oncology

## 2019-10-29 DIAGNOSIS — C9000 Multiple myeloma not having achieved remission: Secondary | ICD-10-CM

## 2019-11-06 NOTE — Progress Notes (Signed)
Called patient for pre-screening question and patient has stated that she is not having any complication nor is she in any pain.

## 2019-11-06 NOTE — Progress Notes (Signed)
[place]  Telephone:([phone] Fax:([phone]) [phone]  ID: Rhonda [name] OB: 1933/07/13  MR#: [id number]  JHE#:[id number]  Patient Care Team: Lavera Guise, MD as PCP - General (Internal Medicine) Lloyd Huger, MD as Consulting Physician (Oncology)  CHIEF COMPLAINT:  Multiple myeloma in relapse.  INTERVAL HISTORY: Patient returns to clinic today for further evaluation and continuation of pomalidomide.  She is tolerating her treatment well without significant side effects.  Her constipation is much improved.  She has a chronic, mild peripheral neuropathy that is unchanged and does not affect her day-to-day activity. She has no other neurologic complaints. She denies any recent fevers or illnesses. She has a good appetite and denies weight loss.  She denies any chest [name], shortness of breath, cough, or hemoptysis.  She denies any nausea, vomiting, or diarrhea.  She has no melena or hematochezia.  She has no urinary complaints.  Patient offers no further specific complaints today.  REVIEW OF SYSTEMS:   Review of Systems  Constitutional: Negative.  Negative for fever, malaise/fatigue and weight loss.  HENT: Negative for congestion and sore throat.   Respiratory: Negative.  Negative for cough and shortness of breath.   Cardiovascular: Negative.  Negative for chest [name], palpitations and leg swelling.  Gastrointestinal: Positive for constipation. Negative for abdominal [name], blood in stool, diarrhea, melena, nausea and vomiting.  Genitourinary: Negative.  Negative for dysuria.  Musculoskeletal: Negative.  Negative for back [name].  Skin: Negative.  Negative for rash.  Neurological: Positive for tingling and sensory change. Negative for focal weakness and weakness.  Psychiatric/Behavioral: Negative.  The patient is not nervous/anxious and does not have insomnia.     As per HPI. Otherwise, a complete review of systems is negative.  PAST MEDICAL HISTORY: Past  Medical History:  Diagnosis Date  . Actinic keratosis   . Arthritis    hands  . Benign neoplasm of ascending colon   . Cataract   . GERD (gastroesophageal reflux disease)   . Hyperlipemia   . Hypertension 01/31/2015  . Hypothyroidism   . Melanoma ([place]) 04/28/2018   R mid to distal ant lat thigh. MM, SS, tumor thickness 0.62m, antatomic level II. Excised: 05/13/18, margins free  . [place]  . [place] 04[place]18[place]  . Osteoporosis   . Shingles   . Skin cancer    basal cell  . Squamous cell carcinoma of skin 02/04/2014   Left pretibial. KA-like pattern  . Squamous cell carcinoma of skin 09/15/2015   Right lat. superior ankle are. Superficial infiltration. Tx: EDC  . Squamous cell carcinoma of skin 10/15/2016   Left medial mid lower leg. Tx: EDC  . Squamous cell carcinoma of skin 11/27/2018   Right dorsum proximal forearm.  . Squamous cell carcinoma of skin 09/09/2019   Right cheek lat. to mid nasolabial area. Tx: EDC    PAST SURGICAL HISTORY: Past Surgical History:  Procedure Laterality Date  . BREAST EXCISIONAL BIOPSY Right 15+ yrs ago   EXCISIONAL - NEG  . CATARACT EXTRACTION W/ INTRAOCULAR LENS IMPLANT    . CHOLECYSTECTOMY N/A 03/14/2017   Procedure: LAPAROSCOPIC CHOLECYSTECTOMY;  Surgeon: CFlorene Glen MD;  Location: [facility];  Service: General;  Laterality: N/A;  . COLONOSCOPY WITH PROPOFOL N/A 02/21/2015   Procedure: COLONOSCOPY WITH PROPOFOL;  Surgeon: DLucilla Lame MD;  [facility]  Service: Endoscopy;  Laterality: N/A;  . [place] POLYPECTOMY  02/21/2015   Procedure: POLYPECTOMY;  Surgeon: Lucilla Lame, MD;  Location: [facility];  Service: Endoscopy;;    FAMILY HISTORY: Family History  Problem Relation Age of Onset  . Heart disease Mother   . Stroke Father   . Heart disease Brother   . Kidney cancer Neg Hx   . Prostate cancer Neg Hx   . Bladder Cancer Neg Hx    . Breast cancer Neg Hx     ADVANCED DIRECTIVES (Y/N):  N  HEALTH MAINTENANCE: Social History   Tobacco Use  . Smoking status: Never Smoker  . Smokeless tobacco: Never Used  Substance Use Topics  . Alcohol use: No    Alcohol/week: 0.0 standard drinks  . Drug use: No     Colonoscopy:  PAP:  Bone density:  Lipid panel:  Allergies  Allergen Reactions  . Revlimid [Lenalidomide]     Current Outpatient Medications  Medication Sig Dispense Refill  . aspirin EC 81 MG tablet Take 81 mg by mouth daily.    . Cyanocobalamin (B-12 PO) Take 500 mcg by mouth daily.     Marland Kitchen EPINEPHrine 0.3 mg/0.3 mL IJ SOAJ injection     . fexofenadine (ALLEGRA) 30 MG tablet Take 30 mg by mouth daily.     Marland Kitchen levothyroxine (SYNTHROID) 50 MCG tablet TAKE 1 TABLET BY MOUTH DAILY BEFORE BREAKFAST 90 tablet 3  . Multiple Vitamins-Minerals (CENTRUM SILVER PO) Take 1 tablet by mouth daily.    Marland Kitchen POMALYST 2 MG capsule TAKE 1 CAPSULE ('2MG'$  TOTAL) BY MOUTH EVERY DAY FOR 21 DAYS, THEN HOLD FOR 7 DAYS 21 capsule 0  . triamcinolone (NASACORT ALLERGY 24HR) 55 MCG/ACT AERO nasal inhaler Place 2 sprays into the nose 2 (two) times daily as needed.     Marland Kitchen UNABLE TO FIND Inject 1 Dose as directed once a week. Allergy injections once a week      No current facility-administered medications for this visit.    OBJECTIVE: Vitals:   11/10/19 1022  BP: 130/61  Pulse: 65  Resp: 20  Temp: (!) 97.4 F (36.3 C)  SpO2: (!) 65%     Body mass index is 23.39 kg/m.    ECOG FS:0 - Asymptomatic  General: Well-developed, well-nourished, no acute distress. Eyes: Pink conjunctiva, anicteric sclera. HEENT: Normocephalic, moist mucous membranes. Lungs: No audible wheezing or coughing. Heart: Regular rate and rhythm. Abdomen: Soft, nontender, no obvious distention. Musculoskeletal: No edema, cyanosis, or clubbing. Neuro: Alert, answering all questions appropriately. Cranial nerves grossly intact. Skin: No rashes or petechiae  noted. Psych: Normal affect.   LAB RESULTS:  Lab Results  Component Value Date   NA 138 11/10/2019   K 3.9 11/10/2019   CL 108 11/10/2019   CO2 23 11/10/2019   GLUCOSE 93 11/10/2019   BUN 19 11/10/2019   CREATININE 0.84 11/10/2019   CALCIUM 9.2 11/10/2019   PROT 6.5 03/04/2017   ALBUMIN 4.2 03/04/2017   AST 24 03/04/2017   ALT 16 03/04/2017   ALKPHOS 49 03/04/2017   BILITOT 0.5 03/04/2017   GFRNONAA >60 11/10/2019   GFRAA >60 11/10/2019    Lab Results  Component Value Date   WBC 3.8 (L) 11/10/2019   NEUTROABS 1.4 (L) 11/10/2019   HGB 11.4 (L) 11/10/2019   HCT 33.3 (L) 11/10/2019   MCV 97.4 11/10/2019   PLT 222 11/10/2019   Lab Results  Component Value Date   TOTALPROTELP 6.0 10/06/2019   ALBUMINELP 3.7 10/06/2019   A1GS 0.2 10/06/2019   A2GS 0.6 10/06/2019   BETS 0.9 10/06/2019  GAMS 0.5 10/06/2019   MSPIKE 0.2 (H) 10/06/2019   SPEI Comment 10/06/2019     STUDIES: No results found.  ASSESSMENT:  Multiple myeloma in relapse.  PLAN:    1.  Multiple myeloma in relapse: Bone marrow biopsy on April 09, 2016 revealed 44% plasma cells and normal cytogenetics. Given the results of her metastatic bone survey on March 12, 2016, her elevated lambda free chains, and hypercalcemia, patient fit the criteria for multiple myeloma and was initially treated with single agent Revlimid. Patient's M spike appears to fluctuate between 0.0 and 0.2 with her most recent results on June 15, 2019 reported 0.2. IgG, IgM, and IgA are all mildly decreased and unchanged.  Lambda free light chains are now trending down and 3178.8.  Metastatic bone survey on February 12, 2019 revealed new small lesions in her calvarium.  Hemoglobin has trended up slightly to 11.3.  Calcium levels are increased to 11.1.  Calcium continues to be within normal limits.  Patient could not tolerate Revlimid and was put on pomalidomide 2 mg daily for 21 days with a 7-day break.  Proceed with cycle 3 of  pomalidomide today.  Return to clinic in 4 weeks for further evaluation and initiation of cycle 4.  Continue monthly Zometa. 3.  Anemia: Chronic and unchanged.  Patient's hemoglobin is 11.4. 4.  Leukopenia: Improved.  Secondary to pomalidomide.  Continue treatment as above. 5.  Thrombocytopenia: Resolved. 6.  Peripheral neuropathy: Patient was previously given a referral to acupuncture which she said did not help much.  7.  Constipation: Continue OTC remedies as needed.  Patient expressed understanding and was in agreement with this plan. She also understands that She can call clinic at any time with any questions, concerns, or complaints.    Lloyd Huger, MD 11/11/19 3:26 PM

## 2019-11-10 ENCOUNTER — Encounter: Payer: Self-pay | Admitting: Oncology

## 2019-11-10 ENCOUNTER — Inpatient Hospital Stay: Payer: Medicare PPO

## 2019-11-10 ENCOUNTER — Inpatient Hospital Stay: Payer: Medicare PPO | Attending: Oncology

## 2019-11-10 ENCOUNTER — Other Ambulatory Visit: Payer: Self-pay

## 2019-11-10 ENCOUNTER — Inpatient Hospital Stay (HOSPITAL_BASED_OUTPATIENT_CLINIC_OR_DEPARTMENT_OTHER): Payer: Medicare PPO | Admitting: Oncology

## 2019-11-10 VITALS — BP 130/61 | HR 65 | Temp 97.4°F | Resp 20 | Wt 127.9 lb

## 2019-11-10 DIAGNOSIS — C9001 Multiple myeloma in remission: Secondary | ICD-10-CM

## 2019-11-10 DIAGNOSIS — Z79899 Other long term (current) drug therapy: Secondary | ICD-10-CM | POA: Diagnosis not present

## 2019-11-10 DIAGNOSIS — Z8249 Family history of ischemic heart disease and other diseases of the circulatory system: Secondary | ICD-10-CM | POA: Insufficient documentation

## 2019-11-10 DIAGNOSIS — Z85820 Personal history of malignant melanoma of skin: Secondary | ICD-10-CM | POA: Insufficient documentation

## 2019-11-10 DIAGNOSIS — K5900 Constipation, unspecified: Secondary | ICD-10-CM | POA: Diagnosis not present

## 2019-11-10 DIAGNOSIS — C9002 Multiple myeloma in relapse: Secondary | ICD-10-CM | POA: Insufficient documentation

## 2019-11-10 DIAGNOSIS — D649 Anemia, unspecified: Secondary | ICD-10-CM | POA: Insufficient documentation

## 2019-11-10 DIAGNOSIS — G629 Polyneuropathy, unspecified: Secondary | ICD-10-CM | POA: Diagnosis not present

## 2019-11-10 DIAGNOSIS — D72819 Decreased white blood cell count, unspecified: Secondary | ICD-10-CM | POA: Insufficient documentation

## 2019-11-10 DIAGNOSIS — C9000 Multiple myeloma not having achieved remission: Secondary | ICD-10-CM

## 2019-11-10 LAB — BASIC METABOLIC PANEL
Anion gap: 7 (ref 5–15)
BUN: 19 mg/dL (ref 8–23)
CO2: 23 mmol/L (ref 22–32)
Calcium: 9.2 mg/dL (ref 8.9–10.3)
Chloride: 108 mmol/L (ref 98–111)
Creatinine, Ser: 0.84 mg/dL (ref 0.44–1.00)
GFR calc Af Amer: >60 mL/min (ref >60)
GFR calc non Af Amer: >60 mL/min (ref >60)
Glucose, Bld: 93 mg/dL (ref 70–99)
Potassium: 3.9 mmol/L (ref 3.5–5.1)
Sodium: 138 mmol/L (ref 135–145)

## 2019-11-10 LAB — CBC WITH DIFFERENTIAL/PLATELET
Abs Immature Granulocytes: 0.01 10*3/uL (ref 0.00–0.07)
Basophils Absolute: 0.1 10*3/uL (ref 0.0–0.1)
Basophils Relative: 3 %
Eosinophils Absolute: 0.1 10*3/uL (ref 0.0–0.5)
Eosinophils Relative: 1 %
HCT: 33.3 % — ABNORMAL LOW (ref 36.0–46.0)
Hemoglobin: 11.4 g/dL — ABNORMAL LOW (ref 12.0–15.0)
Immature Granulocytes: 0 %
Lymphocytes Relative: 32 %
Lymphs Abs: 1.2 10*3/uL (ref 0.7–4.0)
MCH: 33.3 pg (ref 26.0–34.0)
MCHC: 34.2 g/dL (ref 30.0–36.0)
MCV: 97.4 fL (ref 80.0–100.0)
Monocytes Absolute: 1.1 10*3/uL — ABNORMAL HIGH (ref 0.1–1.0)
Monocytes Relative: 28 %
Neutro Abs: 1.4 10*3/uL — ABNORMAL LOW (ref 1.7–7.7)
Neutrophils Relative %: 36 %
Platelets: 222 10*3/uL (ref 150–400)
RBC: 3.42 MIL/uL — ABNORMAL LOW (ref 3.87–5.11)
RDW: 13.3 % (ref 11.5–15.5)
WBC: 3.8 10*3/uL — ABNORMAL LOW (ref 4.0–10.5)
nRBC: 0 % (ref 0.0–0.2)

## 2019-11-11 LAB — PROTEIN ELECTROPHORESIS, SERUM
A/G Ratio: 1.4 (ref 0.7–1.7)
Albumin ELP: 3.5 g/dL (ref 2.9–4.4)
Alpha-1-Globulin: 0.2 g/dL (ref 0.0–0.4)
Alpha-2-Globulin: 0.7 g/dL (ref 0.4–1.0)
Beta Globulin: 0.9 g/dL (ref 0.7–1.3)
Gamma Globulin: 0.6 g/dL (ref 0.4–1.8)
Globulin, Total: 2.5 g/dL (ref 2.2–3.9)
M-Spike, %: 0.3 g/dL — ABNORMAL HIGH
Total Protein ELP: 6 g/dL (ref 6.0–8.5)

## 2019-11-11 LAB — IGG, IGA, IGM
IgA: 21 mg/dL — ABNORMAL LOW (ref 64–422)
IgG (Immunoglobin G), Serum: 494 mg/dL — ABNORMAL LOW (ref 586–1602)
IgM (Immunoglobulin M), Srm: 14 mg/dL — ABNORMAL LOW (ref 26–217)

## 2019-11-11 LAB — KAPPA/LAMBDA LIGHT CHAINS
Kappa free light chain: 17.1 mg/L (ref 3.3–19.4)
Kappa, lambda light chain ratio: 0.01 — ABNORMAL LOW (ref 0.26–1.65)
Lambda free light chains: 3068.7 mg/L — ABNORMAL HIGH (ref 5.7–26.3)

## 2019-11-13 DIAGNOSIS — J301 Allergic rhinitis due to pollen: Secondary | ICD-10-CM | POA: Diagnosis not present

## 2019-11-18 DIAGNOSIS — Z85828 Personal history of other malignant neoplasm of skin: Secondary | ICD-10-CM | POA: Diagnosis not present

## 2019-11-18 DIAGNOSIS — R030 Elevated blood-pressure reading, without diagnosis of hypertension: Secondary | ICD-10-CM | POA: Diagnosis not present

## 2019-11-18 DIAGNOSIS — E039 Hypothyroidism, unspecified: Secondary | ICD-10-CM | POA: Diagnosis not present

## 2019-11-18 DIAGNOSIS — C9000 Multiple myeloma not having achieved remission: Secondary | ICD-10-CM | POA: Diagnosis not present

## 2019-11-20 DIAGNOSIS — J301 Allergic rhinitis due to pollen: Secondary | ICD-10-CM | POA: Diagnosis not present

## 2019-11-21 ENCOUNTER — Other Ambulatory Visit: Payer: Self-pay | Admitting: Oncology

## 2019-11-21 DIAGNOSIS — C9000 Multiple myeloma not having achieved remission: Secondary | ICD-10-CM

## 2019-11-23 ENCOUNTER — Other Ambulatory Visit: Payer: Self-pay | Admitting: *Deleted

## 2019-11-23 DIAGNOSIS — C9000 Multiple myeloma not having achieved remission: Secondary | ICD-10-CM

## 2019-11-23 MED ORDER — POMALIDOMIDE 2 MG PO CAPS: ORAL_CAPSULE | ORAL | 0 refills | Status: DC

## 2019-11-27 DIAGNOSIS — J301 Allergic rhinitis due to pollen: Secondary | ICD-10-CM | POA: Diagnosis not present

## 2019-12-03 NOTE — Progress Notes (Signed)
[place]  [phone] [phone]  ID: Rhonda [name] OB: 23-Jun-1933  MR#: [id number]  JGO#:[id number]  Patient Care Team: Lavera Guise, MD as PCP - General (Internal Medicine) Lloyd Huger, MD as Consulting Physician (Oncology)  CHIEF COMPLAINT:  Multiple myeloma in relapse.  INTERVAL HISTORY: Patient returns to clinic today for further evaluation and continuation of Zometa and pomalidomide.  She is tolerating her treatments well without significant side effects.  She has mild weakness and fatigue, but this does not affect her day-to-day activity.  She has a chronic, mild peripheral neuropathy that is unchanged. She has no other neurologic complaints. She denies any recent fevers or illnesses. She has a good appetite and denies weight loss.  She denies any chest [name], shortness of breath, cough, or hemoptysis.  She denies any nausea, vomiting, constipation, or diarrhea.  She has no melena or hematochezia.  She has no urinary complaints.  Patient offers no further specific complaints today.  REVIEW OF SYSTEMS:   Review of Systems  Constitutional: Positive for malaise/fatigue. Negative for fever and weight loss.  HENT: Negative for congestion and sore throat.   Respiratory: Negative.  Negative for cough and shortness of breath.   Cardiovascular: Negative.  Negative for chest [name], palpitations and leg swelling.  Gastrointestinal: Negative.  Negative for abdominal [name], blood in stool, constipation, diarrhea, melena, nausea and vomiting.  Genitourinary: Negative.  Negative for dysuria.  Musculoskeletal: Negative.  Negative for back [name].  Skin: Negative.  Negative for rash.  Neurological: Positive for tingling, sensory change and weakness. Negative for focal weakness.  Psychiatric/Behavioral: Negative.  The patient is not nervous/anxious and does not have insomnia.     As per HPI. Otherwise, a complete review of systems is negative.  PAST  MEDICAL HISTORY: Past Medical History:  Diagnosis Date   Actinic keratosis    Arthritis    hands   Benign neoplasm of ascending colon    Cataract    GERD (gastroesophageal reflux disease)    Hyperlipemia    Hypertension 01/31/2015   Hypothyroidism    Melanoma ([place]) 04/28/2018   R mid to distal ant lat thigh. MM, SS, tumor thickness 0.56m, antatomic level II. Excised: 05/13/18, margins free   [place] 04/18[place]   Multiple myeloma in remission ([place] 04/18/2016   Osteoporosis    Shingles    Skin cancer    basal cell   Squamous cell carcinoma of skin 02/04/2014   Left pretibial. KA-like pattern   Squamous cell carcinoma of skin 09/15/2015   Right lat. superior ankle are. Superficial infiltration. Tx: EDC   Squamous cell carcinoma of skin 10/15/2016   Left medial mid lower leg. Tx: EDC   Squamous cell carcinoma of skin 11/27/2018   Right dorsum proximal forearm.   Squamous cell carcinoma of skin 09/09/2019   Right cheek lat. to mid nasolabial area. Tx: EDC    PAST SURGICAL HISTORY: Past Surgical History:  Procedure Laterality Date   BREAST EXCISIONAL BIOPSY Right 15+ yrs ago   EXCISIONAL - NEG   CATARACT EXTRACTION W/ INTRAOCULAR LENS IMPLANT     CHOLECYSTECTOMY N/A 03/14/2017   Procedure: LAPAROSCOPIC CHOLECYSTECTOMY;  Surgeon: CFlorene Glen MD;  Location: [facility];  Service: General;  Laterality: N/A;   COLONOSCOPY WITH PROPOFOL N/A 02/21/2015   Procedure: COLONOSCOPY WITH PROPOFOL;  Surgeon: DLucilla Lame MD;  [place]  Service: Endoscopy;  Laterality: N/A;   [place]  POLYPECTOMY  02/21/2015  Procedure: POLYPECTOMY;  Surgeon: Lucilla Lame, MD;  Location: [place];  Service: Endoscopy;;    FAMILY HISTORY: Family History  Problem Relation Age of Onset   Heart disease Mother    Stroke Father    Heart disease Brother    Kidney cancer Neg Hx    Prostate cancer Neg Hx     Bladder Cancer Neg Hx    Breast cancer Neg Hx     ADVANCED DIRECTIVES (Y/N):  N  HEALTH MAINTENANCE: Social History   Tobacco Use   Smoking status: Never Smoker   Smokeless tobacco: Never Used  Vaping Use   Vaping Use: Never used  Substance Use Topics   Alcohol use: No    Alcohol/week: 0.0 standard drinks   Drug use: No     Colonoscopy:  PAP:  Bone density:  Lipid panel:  Allergies  Allergen Reactions   Revlimid [Lenalidomide]     Current Outpatient Medications  Medication Sig Dispense Refill   aspirin EC 81 MG tablet Take 81 mg by mouth daily.     Cyanocobalamin (B-12 PO) Take 500 mcg by mouth daily.      EPINEPHrine 0.3 mg/0.3 mL IJ SOAJ injection      fexofenadine (ALLEGRA) 30 MG tablet Take 30 mg by mouth daily.      levothyroxine (SYNTHROID) 50 MCG tablet TAKE 1 TABLET BY MOUTH DAILY BEFORE BREAKFAST 90 tablet 3   Multiple Vitamins-Minerals (CENTRUM SILVER PO) Take 1 tablet by mouth daily.     pomalidomide (POMALYST) 2 MG capsule TAKE 1 CAPSULE BY MOUTH EVERY DAY FOR 21 DAYS FOLLOWED BY 7 DAYS OFF 21 capsule 0   sulfamethoxazole-trimethoprim (BACTRIM) 400-80 MG tablet Take 1 tablet by mouth 2 (two) times daily. 20 tablet 0   triamcinolone (NASACORT ALLERGY 24HR) 55 MCG/ACT AERO nasal inhaler Place 2 sprays into the nose 2 (two) times daily as needed.      UNABLE TO FIND Inject 1 Dose as directed once a week. Allergy injections once a week      No current facility-administered medications for this visit.    OBJECTIVE: Vitals:   12/09/19 0928  BP: (!) 139/53  Pulse: 66  Temp: 98.6 F (37 C)  SpO2: 100%     Body mass index is 23.76 kg/m.    ECOG FS:0 - Asymptomatic  General: Well-developed, well-nourished, no acute distress. Eyes: Pink conjunctiva, anicteric sclera. HEENT: Normocephalic, moist mucous membranes. Lungs: No audible wheezing or coughing. Heart: Regular rate and rhythm. Abdomen: Soft, nontender, no obvious  distention. Musculoskeletal: No edema, cyanosis, or clubbing. Neuro: Alert, answering all questions appropriately. Cranial nerves grossly intact. Skin: No rashes or petechiae noted. Psych: Normal affect.  LAB RESULTS:  Lab Results  Component Value Date   NA 139 12/09/2019   K 3.6 12/09/2019   CL 108 12/09/2019   CO2 23 12/09/2019   GLUCOSE 79 12/09/2019   BUN 16 12/09/2019   CREATININE 0.97 12/09/2019   CALCIUM 9.2 12/09/2019   PROT 6.5 03/04/2017   ALBUMIN 4.2 03/04/2017   AST 24 03/04/2017   ALT 16 03/04/2017   ALKPHOS 49 03/04/2017   BILITOT 0.5 03/04/2017   GFRNONAA 54 (L) 12/09/2019   GFRAA >60 12/09/2019    Lab Results  Component Value Date   WBC 4.5 12/09/2019   NEUTROABS 1.6 (L) 12/09/2019   HGB 12.2 12/09/2019   HCT 35.3 (L) 12/09/2019   MCV 95.7 12/09/2019   PLT 220 12/09/2019   Lab Results  Component Value Date  TOTALPROTELP 6.0 11/10/2019   ALBUMINELP 3.5 11/10/2019   A1GS 0.2 11/10/2019   A2GS 0.7 11/10/2019   BETS 0.9 11/10/2019   GAMS 0.6 11/10/2019   MSPIKE 0.3 (H) 11/10/2019   SPEI Comment 11/10/2019     STUDIES: No results found.  ASSESSMENT:  Multiple myeloma in relapse.  PLAN:    1.  Multiple myeloma in relapse: Bone marrow biopsy on April 09, 2016 revealed 44% plasma cells and normal cytogenetics. Given the results of her metastatic bone survey on March 12, 2016, her elevated lambda free chains, and hypercalcemia, patient fit the criteria for multiple myeloma and was initially treated with single agent Revlimid.  Patient's most recent M spike is 0.3.  IgG, IgM, and IgA are all mildly decreased and unchanged.  Lambda free light chains continue to trend down and now are 3068.7.  Metastatic bone survey on February 12, 2019 revealed new small lesions in her calvarium.  Hemoglobin has improved to 12.2.  Calcium levels continue to be within normal limits.  Patient could not tolerate Revlimid and was put on pomalidomide 2 mg daily for 21 days  with a 7-day break.  Proceed with cycle 4 of pomalidomide today.  Return to clinic in 4 weeks for further evaluation and consideration of cycle 5.  Continue monthly Zometa as well. 3.  Anemia: Resolved.  Patient's hemoglobin has improved to 12.2. 4.  Leukopenia: Resolved. 5.  Thrombocytopenia: Resolved. 6.  Peripheral neuropathy: Patient was previously given a referral to acupuncture which she said did not help much.  7.  Constipation: Continue OTC remedies as needed.  Patient expressed understanding and was in agreement with this plan. She also understands that She can call clinic at any time with any questions, concerns, or complaints.    Lloyd Huger, MD 12/10/19 6:51 AM

## 2019-12-04 DIAGNOSIS — J301 Allergic rhinitis due to pollen: Secondary | ICD-10-CM | POA: Diagnosis not present

## 2019-12-07 ENCOUNTER — Telehealth: Payer: Self-pay

## 2019-12-07 NOTE — Telephone Encounter (Signed)
Confirmed appointment on 12/08/2019 and screened for covid. klh 

## 2019-12-08 ENCOUNTER — Encounter: Payer: Self-pay | Admitting: Adult Health

## 2019-12-08 ENCOUNTER — Encounter: Payer: Self-pay | Admitting: Oncology

## 2019-12-08 ENCOUNTER — Other Ambulatory Visit: Payer: Self-pay

## 2019-12-08 ENCOUNTER — Ambulatory Visit: Payer: Medicare PPO | Admitting: Adult Health

## 2019-12-08 VITALS — BP 123/66 | HR 75 | Temp 97.5°F | Resp 16 | Ht 62.0 in | Wt 126.6 lb

## 2019-12-08 DIAGNOSIS — L02415 Cutaneous abscess of right lower limb: Secondary | ICD-10-CM

## 2019-12-08 DIAGNOSIS — L03115 Cellulitis of right lower limb: Secondary | ICD-10-CM

## 2019-12-08 MED ORDER — SULFAMETHOXAZOLE-TRIMETHOPRIM 400-80 MG PO TABS: 1.0000 | ORAL_TABLET | Freq: Two times a day (BID) | ORAL | 0 refills | Status: DC

## 2019-12-08 NOTE — Progress Notes (Signed)
[facility] [place], [place] [address]  Internal MEDICINE  Office Visit Note  Patient Name: Rhonda [name]  [id number]  [id number]  Date of Service: 12/08/2019  Chief Complaint  Patient presents with  . Acute Visit    possible boil on right leg    HPI  Pt is here today for boil on right leg.  She reports it has been present for a few weeks.  The area is about 2cm x 2 cm and raised.  She has some tenderness circumferentially, and some intermittent redness. She does have a history of some carcinoma of the thigh, on this leg.  She can not see her dermatologist until September.      Current Medication: Outpatient Encounter Medications as of 12/08/2019  Medication Sig  . aspirin EC 81 MG tablet Take 81 mg by mouth daily.  . Cyanocobalamin (B-12 PO) Take 500 mcg by mouth daily.   Marland Kitchen EPINEPHrine 0.3 mg/0.3 mL IJ SOAJ injection   . fexofenadine (ALLEGRA) 30 MG tablet Take 30 mg by mouth daily.   Marland Kitchen levothyroxine (SYNTHROID) 50 MCG tablet TAKE 1 TABLET BY MOUTH DAILY BEFORE BREAKFAST  . Multiple Vitamins-Minerals (CENTRUM SILVER PO) Take 1 tablet by mouth daily.  . pomalidomide (POMALYST) 2 MG capsule TAKE 1 CAPSULE BY MOUTH EVERY DAY FOR 21 DAYS FOLLOWED BY 7 DAYS OFF  . triamcinolone (NASACORT ALLERGY 24HR) 55 MCG/ACT AERO nasal inhaler Place 2 sprays into the nose 2 (two) times daily as needed.   Marland Kitchen UNABLE TO FIND Inject 1 Dose as directed once a week. Allergy injections once a week   . sulfamethoxazole-trimethoprim (BACTRIM) 400-80 MG tablet Take 1 tablet by mouth 2 (two) times daily.  . [DISCONTINUED] POMALYST 2 MG capsule TAKE 1 CAPSULE (2MG TOTAL) BY MOUTH EVERY DAY FOR 21 DAYS, THEN HOLD FOR 7 DAYS   No facility-administered encounter medications on file as of 12/08/2019.    Surgical History: Past Surgical History:  Procedure Laterality Date  . BREAST EXCISIONAL BIOPSY Right 15+ yrs ago   EXCISIONAL - NEG  . CATARACT EXTRACTION W/ INTRAOCULAR LENS  IMPLANT    . CHOLECYSTECTOMY N/A 03/14/2017   Procedure: LAPAROSCOPIC CHOLECYSTECTOMY;  Surgeon: Florene Glen, MD;  Location: [facility];  Service: General;  Laterality: N/A;  . COLONOSCOPY WITH PROPOFOL N/A 02/21/2015   Procedure: COLONOSCOPY WITH PROPOFOL;  Surgeon: Lucilla Lame, MD;  Location: [place];  Service: Endoscopy;  Laterality: N/A;  . [place]  . POLYPECTOMY  02/21/2015   Procedure: POLYPECTOMY;  Surgeon: Lucilla Lame, MD;  Location: [place];  Service: Endoscopy;;    Medical History: Past Medical History:  Diagnosis Date  . Actinic keratosis   . Arthritis    hands  . Benign neoplasm of ascending colon   . Cataract   . GERD (gastroesophageal reflux disease)   . Hyperlipemia   . Hypertension 01/31/2015  . Hypothyroidism   . Melanoma ([place]) 04/28/2018   R mid to distal ant lat thigh. MM, SS, tumor thickness 0.38m, antatomic level II. Excised: 05/13/18, margins free  . [place] 04[place]  . Multiple myeloma in remission ([place] 04/18/2016  . Osteoporosis   . Shingles   . Skin cancer    basal cell  . Squamous cell carcinoma of skin 02/04/2014   Left pretibial. KA-like pattern  . Squamous cell carcinoma of skin 09/15/2015   Right lat. superior ankle are. Superficial infiltration. Tx: EDC  . Squamous cell carcinoma of skin  10/15/2016   Left medial mid lower leg. Tx: EDC  . Squamous cell carcinoma of skin 11/27/2018   Right dorsum proximal forearm.  . Squamous cell carcinoma of skin 09/09/2019   Right cheek lat. to mid nasolabial area. Tx: EDC    Family History: Family History  Problem Relation Age of Onset  . Heart disease Mother   . Stroke Father   . Heart disease Brother   . Kidney cancer Neg Hx   . Prostate cancer Neg Hx   . Bladder Cancer Neg Hx   . Breast cancer Neg Hx     Social History   Socioeconomic History  . Marital status: Widowed    Spouse name: Not on file  . Number of children: Not on  file  . Years of education: Not on file  . Highest education level: Not on file  Occupational History  . Not on file  Tobacco Use  . Smoking status: Never Smoker  . Smokeless tobacco: Never Used  Vaping Use  . Vaping Use: Never used  Substance and Sexual Activity  . Alcohol use: No    Alcohol/week: 0.0 standard drinks  . Drug use: No  . Sexual activity: Not on file  Other Topics Concern  . Not on file  Social History Narrative  . Not on file   Social Determinants of Health   Financial Resource Strain:   . Difficulty of Paying Living Expenses:   Food Insecurity:   . Worried About Charity fundraiser in the Last Year:   . Arboriculturist in the Last Year:   Transportation Needs:   . Film/video editor (Medical):   Marland Kitchen Lack of Transportation (Non-Medical):   Physical Activity:   . Days of Exercise per Week:   . Minutes of Exercise per Session:   Stress:   . Feeling of Stress :   Social Connections:   . Frequency of Communication with Friends and Family:   . Frequency of Social Gatherings with Friends and Family:   . Attends Religious Services:   . Active Member of Clubs or Organizations:   . Attends Archivist Meetings:   Marland Kitchen Marital Status:   Intimate Partner Violence:   . Fear of Current or Ex-Partner:   . Emotionally Abused:   Marland Kitchen Physically Abused:   . Sexually Abused:       Review of Systems  Constitutional: Negative for chills, fatigue and unexpected weight change.  HENT: Negative for congestion, rhinorrhea, sneezing and sore throat.   Eyes: Negative for photophobia, pain and redness.  Respiratory: Negative for cough, chest tightness and shortness of breath.   Cardiovascular: Negative for chest pain and palpitations.  Gastrointestinal: Negative for abdominal pain, constipation, diarrhea, nausea and vomiting.  Endocrine: Negative.   Genitourinary: Negative for dysuria and frequency.  Musculoskeletal: Negative for arthralgias, back pain, joint  swelling and neck pain.  Skin: Negative for rash.       2cm x 2cm raised red area over right tibia.   Allergic/Immunologic: Negative.   Neurological: Negative for tremors and numbness.  Hematological: Negative for adenopathy. Does not bruise/bleed easily.  Psychiatric/Behavioral: Negative for behavioral problems and sleep disturbance. The patient is not nervous/anxious.     Vital Signs: BP 123/66   Pulse 75   Temp (!) 97.5 F (36.4 C)   Resp 16   Ht '5\' 2"'  (1.575 m)   Wt 126 lb 9.6 oz (57.4 kg)   SpO2 96%   BMI 23.16 kg/m  Physical Exam Vitals and nursing note reviewed.  Constitutional:      General: She is not in acute distress.    Appearance: She is well-developed. She is not diaphoretic.  HENT:     Head: Normocephalic and atraumatic.     Mouth/Throat:     Pharynx: No oropharyngeal exudate.  Eyes:     Pupils: Pupils are equal, round, and reactive to light.  Neck:     Thyroid: No thyromegaly.     Vascular: No JVD.     Trachea: No tracheal deviation.  Cardiovascular:     Rate and Rhythm: Normal rate and regular rhythm.     Heart sounds: Normal heart sounds. No murmur heard.  No friction rub. No gallop.   Pulmonary:     Effort: Pulmonary effort is normal. No respiratory distress.     Breath sounds: Normal breath sounds. No wheezing or rales.  Chest:     Chest wall: No tenderness.  Abdominal:     Palpations: Abdomen is soft.     Tenderness: There is no abdominal tenderness. There is no guarding.  Musculoskeletal:        General: Normal range of motion.     Cervical back: Normal range of motion and neck supple.  Lymphadenopathy:     Cervical: No cervical adenopathy.  Skin:    General: Skin is warm and dry.  Neurological:     Mental Status: She is alert and oriented to person, place, and time.     Cranial Nerves: No cranial nerve deficit.  Psychiatric:        Behavior: Behavior normal.        Thought Content: Thought content normal.        Judgment: Judgment  normal.    Assessment/Plan: 1. Cellulitis and abscess of right leg Advised patient to take entire course of antibiotics as prescribed with food. Pt should return to clinic in 7-10 days if symptoms fail to improve or new symptoms develop.  - sulfamethoxazole-trimethoprim (BACTRIM) 400-80 MG tablet; Take 1 tablet by mouth 2 (two) times daily.  Dispense: 20 tablet; Refill: 0  General Counseling: Rhonda [name] verbalizes understanding of the findings of todays visit and agrees with plan of treatment. I have discussed any further diagnostic evaluation that may be needed or ordered today. We also reviewed her medications today. she has been encouraged to call the office with any questions or concerns that should arise related to todays visit.    No orders of the defined types were placed in this encounter.   Meds ordered this encounter  Medications  . sulfamethoxazole-trimethoprim (BACTRIM) 400-80 MG tablet    Sig: Take 1 tablet by mouth 2 (two) times daily.    Dispense:  20 tablet    Refill:  0    Time spent: 25 Minutes   This patient was seen by Orson Gear AGNP-C in Collaboration with Dr Lavera Guise as a part of collaborative care agreement     Kendell Bane AGNP-C Internal medicine

## 2019-12-08 NOTE — Progress Notes (Signed)
Patient called for pre assessment. Patient states last week a boil like bump popped up on right leg. Patient states it hurts and itches sometimes. Patient was recently seen by her primary and prescribed antibiotics. She also states she has an appointment with her dermatologist tomorrow. She reports no other concerns at this time.

## 2019-12-09 ENCOUNTER — Inpatient Hospital Stay: Payer: Medicare PPO

## 2019-12-09 ENCOUNTER — Ambulatory Visit: Payer: Medicare PPO

## 2019-12-09 ENCOUNTER — Inpatient Hospital Stay (HOSPITAL_BASED_OUTPATIENT_CLINIC_OR_DEPARTMENT_OTHER): Payer: Medicare PPO | Admitting: Oncology

## 2019-12-09 ENCOUNTER — Ambulatory Visit: Payer: Medicare PPO | Admitting: Dermatology

## 2019-12-09 ENCOUNTER — Ambulatory Visit: Payer: Medicare PPO | Admitting: Oncology

## 2019-12-09 ENCOUNTER — Other Ambulatory Visit: Payer: Medicare PPO

## 2019-12-09 VITALS — BP 139/53 | HR 66 | Temp 98.6°F | Wt 129.9 lb

## 2019-12-09 DIAGNOSIS — G629 Polyneuropathy, unspecified: Secondary | ICD-10-CM | POA: Diagnosis not present

## 2019-12-09 DIAGNOSIS — C44722 Squamous cell carcinoma of skin of right lower limb, including hip: Secondary | ICD-10-CM | POA: Diagnosis not present

## 2019-12-09 DIAGNOSIS — L578 Other skin changes due to chronic exposure to nonionizing radiation: Secondary | ICD-10-CM | POA: Diagnosis not present

## 2019-12-09 DIAGNOSIS — C9000 Multiple myeloma not having achieved remission: Secondary | ICD-10-CM

## 2019-12-09 DIAGNOSIS — Z85820 Personal history of malignant melanoma of skin: Secondary | ICD-10-CM | POA: Diagnosis not present

## 2019-12-09 DIAGNOSIS — D649 Anemia, unspecified: Secondary | ICD-10-CM | POA: Diagnosis not present

## 2019-12-09 DIAGNOSIS — K5900 Constipation, unspecified: Secondary | ICD-10-CM | POA: Diagnosis not present

## 2019-12-09 DIAGNOSIS — D72819 Decreased white blood cell count, unspecified: Secondary | ICD-10-CM | POA: Diagnosis not present

## 2019-12-09 DIAGNOSIS — Z8249 Family history of ischemic heart disease and other diseases of the circulatory system: Secondary | ICD-10-CM | POA: Diagnosis not present

## 2019-12-09 DIAGNOSIS — Z79899 Other long term (current) drug therapy: Secondary | ICD-10-CM | POA: Diagnosis not present

## 2019-12-09 DIAGNOSIS — D492 Neoplasm of unspecified behavior of bone, soft tissue, and skin: Secondary | ICD-10-CM

## 2019-12-09 DIAGNOSIS — C9002 Multiple myeloma in relapse: Secondary | ICD-10-CM | POA: Diagnosis not present

## 2019-12-09 DIAGNOSIS — C9001 Multiple myeloma in remission: Secondary | ICD-10-CM

## 2019-12-09 LAB — CBC WITH DIFFERENTIAL/PLATELET
Abs Immature Granulocytes: 0.02 10*3/uL (ref 0.00–0.07)
Basophils Absolute: 0.1 10*3/uL (ref 0.0–0.1)
Basophils Relative: 3 %
Eosinophils Absolute: 0.1 10*3/uL (ref 0.0–0.5)
Eosinophils Relative: 1 %
HCT: 35.3 % — ABNORMAL LOW (ref 36.0–46.0)
Hemoglobin: 12.2 g/dL (ref 12.0–15.0)
Immature Granulocytes: 0 %
Lymphocytes Relative: 34 %
Lymphs Abs: 1.5 10*3/uL (ref 0.7–4.0)
MCH: 33.1 pg (ref 26.0–34.0)
MCHC: 34.6 g/dL (ref 30.0–36.0)
MCV: 95.7 fL (ref 80.0–100.0)
Monocytes Absolute: 1.1 10*3/uL — ABNORMAL HIGH (ref 0.1–1.0)
Monocytes Relative: 25 %
Neutro Abs: 1.6 10*3/uL — ABNORMAL LOW (ref 1.7–7.7)
Neutrophils Relative %: 37 %
Platelets: 220 10*3/uL (ref 150–400)
RBC: 3.69 MIL/uL — ABNORMAL LOW (ref 3.87–5.11)
RDW: 13.4 % (ref 11.5–15.5)
WBC: 4.5 10*3/uL (ref 4.0–10.5)
nRBC: 0 % (ref 0.0–0.2)

## 2019-12-09 LAB — BASIC METABOLIC PANEL
Anion gap: 8 (ref 5–15)
BUN: 16 mg/dL (ref 8–23)
CO2: 23 mmol/L (ref 22–32)
Calcium: 9.2 mg/dL (ref 8.9–10.3)
Chloride: 108 mmol/L (ref 98–111)
Creatinine, Ser: 0.97 mg/dL (ref 0.44–1.00)
GFR calc Af Amer: >60 mL/min (ref >60)
GFR calc non Af Amer: 54 mL/min — ABNORMAL LOW (ref >60)
Glucose, Bld: 79 mg/dL (ref 70–99)
Potassium: 3.6 mmol/L (ref 3.5–5.1)
Sodium: 139 mmol/L (ref 135–145)

## 2019-12-09 MED ORDER — SODIUM CHLORIDE 0.9 % IV SOLN
INTRAVENOUS | Status: DC
  Filled 2019-12-09: qty 250

## 2019-12-09 MED ORDER — ZOLEDRONIC ACID 4 MG/5ML IV CONC
3.0000 mg | Freq: Once | INTRAVENOUS | Status: AC
  Administered 2019-12-09: 3 mg via INTRAVENOUS
  Filled 2019-12-09: qty 3.75

## 2019-12-09 NOTE — Patient Instructions (Signed)

## 2019-12-09 NOTE — Progress Notes (Signed)
   Follow-Up Visit   Subjective  Rhonda [name] is a 86 y.o. female who presents for the following: check spot (R lower leg, 2-3 wks, pts PCP put her on Bactrim).  The following portions of the chart were reviewed this encounter and updated as appropriate:  Tobacco  Allergies  Meds  Problems  Med Hx  Surg Hx  Fam Hx      Review of Systems:  No other skin or systemic complaints except as noted in HPI or Assessment and Plan.  Objective  Well appearing patient in no apparent distress; mood and affect are within normal limits.  A focused examination was performed including R lower leg. Relevant physical exam findings are noted in the Assessment and Plan.  Objective  Right pretibial below knee: 1.5cm keratotic pap   Assessment & Plan    Actinic Damage - diffuse scaly erythematous macules with underlying dyspigmentation - Recommend daily broad spectrum sunscreen SPF 30+ to sun-exposed areas, reapply every 2 hours as needed.  - Call for new or changing lesions.  Neoplasm of skin Right pretibial below knee  Epidermal / dermal shaving  Lesion diameter (cm):  1.5 Informed consent: discussed and consent obtained   Timeout: patient name, date of birth, surgical site, and procedure verified   Procedure prep:  Patient was prepped and draped in usual sterile fashion Prep type:  Isopropyl alcohol Anesthesia: the lesion was anesthetized in a standard fashion   Anesthetic:  1% lidocaine w/ epinephrine 1-100,000 buffered w/ 8.4% NaHCO3 Instrument used: flexible razor blade   Hemostasis achieved with: pressure, aluminum chloride and electrodesiccation   Outcome: patient tolerated procedure well   Post-procedure details: sterile dressing applied and wound care instructions given   Dressing type: bandage and petrolatum   Additional details:  Post txt defect 2.1cm  Destruction of lesion Complexity: extensive   Destruction method: electrodesiccation and curettage   Informed  consent: discussed and consent obtained   Timeout:  patient name, date of birth, surgical site, and procedure verified Procedure prep:  Patient was prepped and draped in usual sterile fashion Prep type:  Isopropyl alcohol Anesthesia: the lesion was anesthetized in a standard fashion   Anesthetic:  1% lidocaine w/ epinephrine 1-100,000 buffered w/ 8.4% NaHCO3 Curettage performed in three different directions: Yes   Electrodesiccation performed over the curetted area: Yes   Lesion length (cm):  1.5 Lesion width (cm):  1.5 Margin per side (cm):  0.3 Final wound size (cm):  2.1 Hemostasis achieved with:  pressure, aluminum chloride and electrodesiccation Outcome: patient tolerated procedure well with no complications   Post-procedure details: sterile dressing applied and wound care instructions given   Dressing type: bandage and petrolatum    Specimen 1 - Surgical pathology Differential Diagnosis: D48.5 SCC vs other Check Margins: No 1.5cm keratotic pap EDC  SCC (squamous cell carcinoma), leg, right  Return for Return to clinic as scheduled for [facility] 02/17/20.   I, Othelia Pulling, RMA, am acting as scribe for Sarina Ser, MD .  Documentation: I have reviewed the above documentation for accuracy and completeness, and I agree with the above.  Sarina Ser, MD

## 2019-12-10 DIAGNOSIS — J301 Allergic rhinitis due to pollen: Secondary | ICD-10-CM | POA: Diagnosis not present

## 2019-12-10 LAB — PROTEIN ELECTROPHORESIS, SERUM
A/G Ratio: 1.7 (ref 0.7–1.7)
Albumin ELP: 3.7 g/dL (ref 2.9–4.4)
Alpha-1-Globulin: 0.2 g/dL (ref 0.0–0.4)
Alpha-2-Globulin: 0.6 g/dL (ref 0.4–1.0)
Beta Globulin: 0.8 g/dL (ref 0.7–1.3)
Gamma Globulin: 0.5 g/dL (ref 0.4–1.8)
Globulin, Total: 2.2 g/dL (ref 2.2–3.9)
M-Spike, %: 0.2 g/dL — ABNORMAL HIGH
Total Protein ELP: 5.9 g/dL — ABNORMAL LOW (ref 6.0–8.5)

## 2019-12-10 LAB — IGG, IGA, IGM
IgA: 23 mg/dL — ABNORMAL LOW (ref 64–422)
IgG (Immunoglobin G), Serum: 527 mg/dL — ABNORMAL LOW (ref 586–1602)
IgM (Immunoglobulin M), Srm: 14 mg/dL — ABNORMAL LOW (ref 26–217)

## 2019-12-10 LAB — KAPPA/LAMBDA LIGHT CHAINS
Kappa free light chain: 16.7 mg/L (ref 3.3–19.4)
Kappa, lambda light chain ratio: 0.01 — ABNORMAL LOW (ref 0.26–1.65)
Lambda free light chains: 2649.2 mg/L — ABNORMAL HIGH (ref 5.7–26.3)

## 2019-12-11 DIAGNOSIS — J301 Allergic rhinitis due to pollen: Secondary | ICD-10-CM | POA: Diagnosis not present

## 2019-12-17 ENCOUNTER — Ambulatory Visit: Payer: Medicare PPO | Admitting: Adult Health

## 2019-12-17 ENCOUNTER — Other Ambulatory Visit: Payer: Self-pay | Admitting: Oncology

## 2019-12-17 DIAGNOSIS — C9000 Multiple myeloma not having achieved remission: Secondary | ICD-10-CM

## 2019-12-18 DIAGNOSIS — J301 Allergic rhinitis due to pollen: Secondary | ICD-10-CM | POA: Diagnosis not present

## 2019-12-21 ENCOUNTER — Encounter: Payer: Self-pay | Admitting: Dermatology

## 2019-12-21 ENCOUNTER — Telehealth: Payer: Self-pay

## 2019-12-21 NOTE — Telephone Encounter (Signed)
Advised pt of bx results/sh ?

## 2019-12-21 NOTE — Telephone Encounter (Signed)
-----   Message from Ralene Bathe, MD sent at 12/20/2019  7:12 PM EDT ----- Skin (M), right pretibial below knee SQUAMOUS CELL CARCINOMA, KERATOACANTHOMA TYPE,  Cancer - SCC Already treated Recheck next visit

## 2019-12-24 ENCOUNTER — Other Ambulatory Visit: Payer: Self-pay | Admitting: Emergency Medicine

## 2019-12-24 DIAGNOSIS — C9000 Multiple myeloma not having achieved remission: Secondary | ICD-10-CM

## 2019-12-24 DIAGNOSIS — J301 Allergic rhinitis due to pollen: Secondary | ICD-10-CM | POA: Diagnosis not present

## 2019-12-24 MED ORDER — POMALIDOMIDE 2 MG PO CAPS: ORAL_CAPSULE | ORAL | 0 refills | Status: DC

## 2019-12-25 DIAGNOSIS — J301 Allergic rhinitis due to pollen: Secondary | ICD-10-CM | POA: Diagnosis not present

## 2019-12-31 DIAGNOSIS — H40153 Residual stage of open-angle glaucoma, bilateral: Secondary | ICD-10-CM | POA: Diagnosis not present

## 2020-01-02 NOTE — Progress Notes (Signed)
Rhonda [name]  [phone] [phone]  ID: [name] OB: 28-May-1934  MR#: [id number]  VEL#:[id number]  Patient Care Team: Lavera Guise, MD as PCP - General (Internal Medicine) Lloyd Huger, MD as Consulting Physician (Oncology)  CHIEF COMPLAINT:  Multiple myeloma in relapse.  INTERVAL HISTORY: Patient returns to clinic today for repeat laboratory work, further evaluation, and continuation of Zometa and pomalidomide.  She continues to tolerate her treatments well without significant side effects.  She has chronic weakness and fatigue that does not affect her day-to-day activity. She continues to have a mild peripheral neuropathy.  She has no other neurologic complaints. She denies any recent fevers or illnesses. She has a good appetite and denies weight loss.  She denies any chest pain, shortness of breath, cough, or hemoptysis.  She denies any nausea, vomiting, constipation, or diarrhea.  She has no melena or hematochezia.  She has no urinary complaints.  Patient offers no further specific complaints today.  REVIEW OF SYSTEMS:   Review of Systems  Constitutional: Positive for malaise/fatigue. Negative for fever and weight loss.  HENT: Negative for congestion and sore throat.   Respiratory: Negative.  Negative for cough and shortness of breath.   Cardiovascular: Negative.  Negative for chest pain, palpitations and leg swelling.  Gastrointestinal: Negative.  Negative for abdominal pain, blood in stool, constipation, diarrhea, melena, nausea and vomiting.  Genitourinary: Negative.  Negative for dysuria.  Musculoskeletal: Negative.  Negative for back pain.  Skin: Negative.  Negative for rash.  Neurological: Positive for tingling, sensory change and weakness. Negative for focal weakness.  Psychiatric/Behavioral: Negative.  The patient is not nervous/anxious and does not have insomnia.     As per HPI. Otherwise, a complete review of systems is  negative.  PAST MEDICAL HISTORY: Past Medical History:  Diagnosis Date  . Actinic keratosis   . Arthritis    hands  . Benign neoplasm of ascending colon   . Cataract   . GERD (gastroesophageal reflux disease)   . Hyperlipemia   . Hypertension 01/31/2015  . Hypothyroidism   . Melanoma ([place]) 04/28/2018   R mid to distal ant lat thigh. MM, SS, tumor thickness 0.81m, antatomic level II. Excised: 05/13/18, margins free  . [place] 04/18/2016  . [place] myeloma in remission [place] 04/18/2016  . Osteoporosis   . Shingles   . Skin cancer    basal cell  . Squamous cell carcinoma of skin 02/04/2014   Left pretibial. KA-like pattern  . Squamous cell carcinoma of skin 09/15/2015   Right lat. superior ankle are. Superficial infiltration. Tx: EDC  . Squamous cell carcinoma of skin 10/15/2016   Left medial mid lower leg. Tx: EDC  . Squamous cell carcinoma of skin 11/27/2018   Right dorsum proximal forearm.  . Squamous cell carcinoma of skin 09/09/2019   Right cheek lat. to mid nasolabial area. Tx: EDC    PAST SURGICAL HISTORY: Past Surgical History:  Procedure Laterality Date  . BREAST EXCISIONAL BIOPSY Right 15+ yrs ago   EXCISIONAL - NEG  . CATARACT EXTRACTION W/ INTRAOCULAR LENS IMPLANT    . CHOLECYSTECTOMY N/A 03/14/2017   Procedure: LAPAROSCOPIC CHOLECYSTECTOMY;  Surgeon: CFlorene Glen MD;  Location: [facility];  Service: General;  Laterality: N/A;  . COLONOSCOPY WITH PROPOFOL N/A 02/21/2015   Procedure: COLONOSCOPY WITH PROPOFOL;  Surgeon: DLucilla Lame MD;  [place] [facility]  Service: Endoscopy;  Laterality: N/A;  . [place] . POLYPECTOMY  02/21/2015   Procedure: POLYPECTOMY;  Surgeon: Lucilla Lame, MD;  Location: [place];  Service: Endoscopy;;    FAMILY HISTORY: Family History  Problem Relation Age of Onset  . Heart disease Mother   . Stroke Father   . Heart disease Brother   . Kidney cancer Neg Hx   . Prostate  cancer Neg Hx   . Bladder Cancer Neg Hx   . Breast cancer Neg Hx     ADVANCED DIRECTIVES (Y/N):  N  HEALTH MAINTENANCE: Social History   Tobacco Use  . Smoking status: Never Smoker  . Smokeless tobacco: Never Used  Vaping Use  . Vaping Use: Never used  Substance Use Topics  . Alcohol use: No    Alcohol/week: 0.0 standard drinks  . Drug use: No     Colonoscopy:  PAP:  Bone density:  Lipid panel:  Allergies  Allergen Reactions  . Revlimid [Lenalidomide]     Current Outpatient Medications  Medication Sig Dispense Refill  . aspirin EC 81 MG tablet Take 81 mg by mouth daily.    . Cyanocobalamin (B-12 PO) Take 500 mcg by mouth daily.     Marland Kitchen EPINEPHrine 0.3 mg/0.3 mL IJ SOAJ injection     . fexofenadine (ALLEGRA) 30 MG tablet Take 30 mg by mouth daily.     Marland Kitchen levothyroxine (SYNTHROID) 50 MCG tablet TAKE 1 TABLET BY MOUTH DAILY BEFORE BREAKFAST 90 tablet 3  . Multiple Vitamins-Minerals (CENTRUM SILVER PO) Take 1 tablet by mouth daily.    . pomalidomide (POMALYST) 2 MG capsule TAKE 1 CAPSULE BY MOUTH EVERY DAY FOR 21 DAYS FOLLOWED BY 7 DAYS OFF 21 capsule 0  . sulfamethoxazole-trimethoprim (BACTRIM) 400-80 MG tablet Take 1 tablet by mouth 2 (two) times daily. 20 tablet 0  . triamcinolone (NASACORT ALLERGY 24HR) 55 MCG/ACT AERO nasal inhaler Place 2 sprays into the nose 2 (two) times daily as needed.     Marland Kitchen UNABLE TO FIND Inject 1 Dose as directed once a week. Allergy injections once a week      No current facility-administered medications for this visit.    OBJECTIVE: Vitals:   01/05/20 1129  BP: (!) 124/62  Pulse: 70  Resp: 20  Temp: 98.8 F (37.1 C)  SpO2: 100%     Body mass index is 23.39 kg/m.    ECOG FS:0 - Asymptomatic  General: Well-developed, well-nourished, no acute distress. Eyes: Pink conjunctiva, anicteric sclera. HEENT: Normocephalic, moist mucous membranes. Lungs: No audible wheezing or coughing. Heart: Regular rate and rhythm. Abdomen: Soft,  nontender, no obvious distention. Musculoskeletal: No edema, cyanosis, or clubbing. Neuro: Alert, answering all questions appropriately. Cranial nerves grossly intact. Skin: No rashes or petechiae noted. Psych: Normal affect.  LAB RESULTS:  Lab Results  Component Value Date   NA 138 01/05/2020   K 4.3 01/05/2020   CL 107 01/05/2020   CO2 22 01/05/2020   GLUCOSE 92 01/05/2020   BUN 24 (H) 01/05/2020   CREATININE 1.20 (H) 01/05/2020   CALCIUM 8.8 (L) 01/05/2020   PROT 6.5 03/04/2017   ALBUMIN 4.2 03/04/2017   AST 24 03/04/2017   ALT 16 03/04/2017   ALKPHOS 49 03/04/2017   BILITOT 0.5 03/04/2017   GFRNONAA 41 (L) 01/05/2020   GFRAA 48 (L) 01/05/2020    Lab Results  Component Value Date   WBC 3.4 (L) 01/05/2020   NEUTROABS 1.3 (L) 01/05/2020   HGB 11.6 (L) 01/05/2020   HCT 33.9 (L) 01/05/2020   MCV 94.2 01/05/2020  PLT 166 01/05/2020   Lab Results  Component Value Date   TOTALPROTELP 5.9 (L) 01/05/2020   ALBUMINELP 3.6 01/05/2020   A1GS 0.2 01/05/2020   A2GS 0.7 01/05/2020   BETS 0.8 01/05/2020   GAMS 0.6 01/05/2020   MSPIKE 0.3 (H) 01/05/2020   SPEI Comment 01/05/2020     STUDIES: No results found.  ASSESSMENT:  Multiple myeloma in relapse.  PLAN:    1.  Multiple myeloma in relapse: Bone marrow biopsy on April 09, 2016 revealed 44% plasma cells and normal cytogenetics. Given the results of her metastatic bone survey on March 12, 2016, her elevated lambda free chains, and hypercalcemia, patient fit the criteria for multiple myeloma and was initially treated with single agent Revlimid.  Patient's most recent M spike is unchanged at 0.3. IgG, IgM, and IgA are all mildly decreased and unchanged.  Lambda free light chains continue to trend down and are now 2788.3.  Metastatic bone survey on February 12, 2019 revealed new small lesions in her calvarium.  Patient could not tolerate Revlimid and was switched to pomalidomide 2 mg daily for 21 days with a 7-day break.   Proceed with cycle 5 of pomalidomide today.  Return to clinic in 4 weeks for further evaluation and consideration of cycle 6.  Proceed with Zometa as ordered. 3.  Anemia: Patient's hemoglobin is 11.6, monitor. 4.  Leukopenia: Mild, monitor. 5.  Thrombocytopenia: Resolved. 6.  Peripheral neuropathy: Patient was previously given a referral to acupuncture which she said did not help much.  7.  Constipation: Patient does not complain of this today.  Continue OTC remedies as needed. 8.  Renal insufficiency: Patient's creatinine has trended up slightly to 1.2, monitor. 9.  Hypercalcemia: Resolved.  Patient expressed understanding and was in agreement with this plan. She also understands that She can call clinic at any time with any questions, concerns, or complaints.    Lloyd Huger, MD 01/07/20 6:07 AM

## 2020-01-05 ENCOUNTER — Inpatient Hospital Stay: Payer: Medicare PPO

## 2020-01-05 ENCOUNTER — Inpatient Hospital Stay: Payer: Medicare PPO | Attending: Oncology | Admitting: Oncology

## 2020-01-05 ENCOUNTER — Other Ambulatory Visit: Payer: Self-pay

## 2020-01-05 ENCOUNTER — Encounter: Payer: Self-pay | Admitting: Oncology

## 2020-01-05 VITALS — BP 124/62 | HR 70 | Temp 98.8°F | Resp 20 | Wt 127.9 lb

## 2020-01-05 VITALS — BP 123/62 | HR 58 | Resp 18

## 2020-01-05 DIAGNOSIS — Z9049 Acquired absence of other specified parts of digestive tract: Secondary | ICD-10-CM | POA: Diagnosis not present

## 2020-01-05 DIAGNOSIS — Z86010 Personal history of colonic polyps: Secondary | ICD-10-CM | POA: Diagnosis not present

## 2020-01-05 DIAGNOSIS — D649 Anemia, unspecified: Secondary | ICD-10-CM | POA: Diagnosis not present

## 2020-01-05 DIAGNOSIS — K5900 Constipation, unspecified: Secondary | ICD-10-CM | POA: Insufficient documentation

## 2020-01-05 DIAGNOSIS — Z823 Family history of stroke: Secondary | ICD-10-CM | POA: Diagnosis not present

## 2020-01-05 DIAGNOSIS — I10 Essential (primary) hypertension: Secondary | ICD-10-CM | POA: Insufficient documentation

## 2020-01-05 DIAGNOSIS — G629 Polyneuropathy, unspecified: Secondary | ICD-10-CM | POA: Diagnosis not present

## 2020-01-05 DIAGNOSIS — R531 Weakness: Secondary | ICD-10-CM | POA: Insufficient documentation

## 2020-01-05 DIAGNOSIS — Z79899 Other long term (current) drug therapy: Secondary | ICD-10-CM | POA: Insufficient documentation

## 2020-01-05 DIAGNOSIS — D72819 Decreased white blood cell count, unspecified: Secondary | ICD-10-CM | POA: Diagnosis not present

## 2020-01-05 DIAGNOSIS — C9000 Multiple myeloma not having achieved remission: Secondary | ICD-10-CM

## 2020-01-05 DIAGNOSIS — N289 Disorder of kidney and ureter, unspecified: Secondary | ICD-10-CM | POA: Insufficient documentation

## 2020-01-05 DIAGNOSIS — C9002 Multiple myeloma in relapse: Secondary | ICD-10-CM | POA: Insufficient documentation

## 2020-01-05 DIAGNOSIS — Z85820 Personal history of malignant melanoma of skin: Secondary | ICD-10-CM | POA: Diagnosis not present

## 2020-01-05 DIAGNOSIS — R5383 Other fatigue: Secondary | ICD-10-CM | POA: Insufficient documentation

## 2020-01-05 DIAGNOSIS — Z8249 Family history of ischemic heart disease and other diseases of the circulatory system: Secondary | ICD-10-CM | POA: Diagnosis not present

## 2020-01-05 DIAGNOSIS — C9001 Multiple myeloma in remission: Secondary | ICD-10-CM

## 2020-01-05 LAB — CBC WITH DIFFERENTIAL/PLATELET
Abs Immature Granulocytes: 0.01 10*3/uL (ref 0.00–0.07)
Basophils Absolute: 0.1 10*3/uL (ref 0.0–0.1)
Basophils Relative: 2 %
Eosinophils Absolute: 0 10*3/uL (ref 0.0–0.5)
Eosinophils Relative: 1 %
HCT: 33.9 % — ABNORMAL LOW (ref 36.0–46.0)
Hemoglobin: 11.6 g/dL — ABNORMAL LOW (ref 12.0–15.0)
Immature Granulocytes: 0 %
Lymphocytes Relative: 36 %
Lymphs Abs: 1.2 10*3/uL (ref 0.7–4.0)
MCH: 32.2 pg (ref 26.0–34.0)
MCHC: 34.2 g/dL (ref 30.0–36.0)
MCV: 94.2 fL (ref 80.0–100.0)
Monocytes Absolute: 0.8 10*3/uL (ref 0.1–1.0)
Monocytes Relative: 24 %
Neutro Abs: 1.3 10*3/uL — ABNORMAL LOW (ref 1.7–7.7)
Neutrophils Relative %: 37 %
Platelets: 166 10*3/uL (ref 150–400)
RBC: 3.6 MIL/uL — ABNORMAL LOW (ref 3.87–5.11)
RDW: 13.2 % (ref 11.5–15.5)
WBC: 3.4 10*3/uL — ABNORMAL LOW (ref 4.0–10.5)
nRBC: 0 % (ref 0.0–0.2)

## 2020-01-05 LAB — BASIC METABOLIC PANEL
Anion gap: 9 (ref 5–15)
BUN: 24 mg/dL — ABNORMAL HIGH (ref 8–23)
CO2: 22 mmol/L (ref 22–32)
Calcium: 8.8 mg/dL — ABNORMAL LOW (ref 8.9–10.3)
Chloride: 107 mmol/L (ref 98–111)
Creatinine, Ser: 1.2 mg/dL — ABNORMAL HIGH (ref 0.44–1.00)
GFR calc Af Amer: 48 mL/min — ABNORMAL LOW (ref >60)
GFR calc non Af Amer: 41 mL/min — ABNORMAL LOW (ref >60)
Glucose, Bld: 92 mg/dL (ref 70–99)
Potassium: 4.3 mmol/L (ref 3.5–5.1)
Sodium: 138 mmol/L (ref 135–145)

## 2020-01-05 MED ORDER — SODIUM CHLORIDE 0.9 % IV SOLN
Freq: Once | INTRAVENOUS | Status: AC
  Filled 2020-01-05: qty 250

## 2020-01-05 MED ORDER — ZOLEDRONIC ACID 4 MG/5ML IV CONC
3.0000 mg | Freq: Once | INTRAVENOUS | Status: AC
  Administered 2020-01-05: 3 mg via INTRAVENOUS
  Filled 2020-01-05: qty 3.75

## 2020-01-06 ENCOUNTER — Ambulatory Visit: Payer: Medicare PPO

## 2020-01-06 ENCOUNTER — Ambulatory Visit: Payer: Medicare PPO | Admitting: Oncology

## 2020-01-06 ENCOUNTER — Other Ambulatory Visit: Payer: Medicare PPO

## 2020-01-06 LAB — IGG, IGA, IGM
IgA: 25 mg/dL — ABNORMAL LOW (ref 64–422)
IgG (Immunoglobin G), Serum: 495 mg/dL — ABNORMAL LOW (ref 586–1602)
IgM (Immunoglobulin M), Srm: 12 mg/dL — ABNORMAL LOW (ref 26–217)

## 2020-01-06 LAB — KAPPA/LAMBDA LIGHT CHAINS
Kappa free light chain: 19.7 mg/L — ABNORMAL HIGH (ref 3.3–19.4)
Kappa, lambda light chain ratio: 0.01 — ABNORMAL LOW (ref 0.26–1.65)
Lambda free light chains: 2788.3 mg/L — ABNORMAL HIGH (ref 5.7–26.3)

## 2020-01-06 LAB — PROTEIN ELECTROPHORESIS, SERUM
A/G Ratio: 1.6 (ref 0.7–1.7)
Albumin ELP: 3.6 g/dL (ref 2.9–4.4)
Alpha-1-Globulin: 0.2 g/dL (ref 0.0–0.4)
Alpha-2-Globulin: 0.7 g/dL (ref 0.4–1.0)
Beta Globulin: 0.8 g/dL (ref 0.7–1.3)
Gamma Globulin: 0.6 g/dL (ref 0.4–1.8)
Globulin, Total: 2.3 g/dL (ref 2.2–3.9)
M-Spike, %: 0.3 g/dL — ABNORMAL HIGH
Total Protein ELP: 5.9 g/dL — ABNORMAL LOW (ref 6.0–8.5)

## 2020-01-08 DIAGNOSIS — J301 Allergic rhinitis due to pollen: Secondary | ICD-10-CM | POA: Diagnosis not present

## 2020-01-15 DIAGNOSIS — J301 Allergic rhinitis due to pollen: Secondary | ICD-10-CM | POA: Diagnosis not present

## 2020-01-22 DIAGNOSIS — J301 Allergic rhinitis due to pollen: Secondary | ICD-10-CM | POA: Diagnosis not present

## 2020-01-28 NOTE — Progress Notes (Signed)
[place]  Telephone:(336) [phone] [phone]) [phone]  ID: Rhonda [name] OB: Apr 22, 1934  MR#: [id number]  CBJ#:[id number]  Patient Care Team: Lavera Guise, MD as PCP - General (Internal Medicine) Lloyd Huger, MD as Consulting Physician (Oncology)  I connected with Rhonda [name] on 02/02/20 at  1:30 PM EDT by video enabled telemedicine visit and verified that I am speaking with the correct person using two identifiers.   I discussed the limitations, risks, security and privacy concerns of performing an evaluation and management service by telemedicine and the availability of in-person appointments. I also discussed with the patient that there may be a patient responsible charge related to this service. The patient expressed understanding and agreed to proceed.   Other persons participating in the visit and their role in the encounter: Patient, MD.  Patient's location: Clinic. Provider's location: Home.  CHIEF COMPLAINT:  Multiple myeloma in relapse.  INTERVAL HISTORY: Patient agreed to video assisted telemedicine visit for further evaluation and continuation of treatment.  She continues to tolerate Zometa and pomalidomide well without significant side effects.  She continues to have chronic weakness and fatigue, but this does not affect her day-to-day activity.  She continues to have a mild peripheral neuropathy.  She has no other neurologic complaints. She denies any recent fevers or illnesses. She has a good appetite and denies weight loss.  She denies any chest [name], shortness of breath, cough, or hemoptysis.  She denies any nausea, vomiting, constipation, or diarrhea.  She has no melena or hematochezia.  She has no urinary complaints.  Patient offers no further specific complaints today.  REVIEW OF SYSTEMS:   Review of Systems  Constitutional: Positive for malaise/fatigue. Negative for fever and weight loss.  HENT: Negative for congestion and sore  throat.   Respiratory: Negative.  Negative for cough and shortness of breath.   Cardiovascular: Negative.  Negative for chest [name], palpitations and leg swelling.  Gastrointestinal: Negative.  Negative for abdominal [name], blood in stool, constipation, diarrhea, melena, nausea and vomiting.  Genitourinary: Negative.  Negative for dysuria.  Musculoskeletal: Negative.  Negative for back [name].  Skin: Negative.  Negative for rash.  Neurological: Positive for tingling, sensory change and weakness. Negative for focal weakness.  Psychiatric/Behavioral: Negative.  The patient is not nervous/anxious and does not have insomnia.     As per HPI. Otherwise, a complete review of systems is negative.  PAST MEDICAL HISTORY: Past Medical History:  Diagnosis Date  . Actinic keratosis   . Arthritis    hands  . Benign neoplasm of ascending colon   . Cataract   . GERD (gastroesophageal reflux disease)   . Hyperlipemia   . Hypertension 01/31/2015  . Hypothyroidism   . Melanoma ([place]) 04/28/2018   R mid to distal ant lat thigh. MM, SS, tumor thickness 0.44m, antatomic level II. Excised: 05/13/18, margins free  . Multiple myeloma ([facility] 04/18/2016  . [place] [facility] 04/18/2016  . Osteoporosis   . Shingles   . Skin cancer    basal cell  . Squamous cell carcinoma of skin 02/04/2014   Left pretibial. KA-like pattern  . Squamous cell carcinoma of skin 09/15/2015   Right lat. superior ankle are. Superficial infiltration. Tx: EDC  . Squamous cell carcinoma of skin 10/15/2016   Left medial mid lower leg. Tx: EDC  . Squamous cell carcinoma of skin 11/27/2018   Right dorsum proximal forearm.  . Squamous cell carcinoma of skin 09/09/2019   Right cheek  lat. to mid nasolabial area. Tx: EDC    PAST SURGICAL HISTORY: Past Surgical History:  Procedure Laterality Date  . BREAST EXCISIONAL BIOPSY Right 15+ yrs ago   EXCISIONAL - NEG  . CATARACT EXTRACTION W/ INTRAOCULAR LENS IMPLANT    .  CHOLECYSTECTOMY N/A 03/14/2017   Procedure: LAPAROSCOPIC CHOLECYSTECTOMY;  Surgeon: Florene Glen, MD;  Location: [facility];  Service: General;  Laterality: N/A;  . COLONOSCOPY WITH PROPOFOL N/A 02/21/2015   Procedure: COLONOSCOPY WITH PROPOFOL;  Surgeon: Lucilla Lame, MD;  Location: [place];  Service: Endoscopy;  Laterality: N/A;  . [place]  . POLYPECTOMY  02/21/2015   Procedure: POLYPECTOMY;  Surgeon: Lucilla Lame, MD;  Location: [place];  Service: Endoscopy;;    FAMILY HISTORY: Family History  Problem Relation Age of Onset  . Heart disease Mother   . Stroke Father   . Heart disease Brother   . Kidney cancer Neg Hx   . Prostate cancer Neg Hx   . Bladder Cancer Neg Hx   . Breast cancer Neg Hx     ADVANCED DIRECTIVES (Y/N):  N  HEALTH MAINTENANCE: Social History   Tobacco Use  . Smoking status: Never Smoker  . Smokeless tobacco: Never Used  Vaping Use  . Vaping Use: Never used  Substance Use Topics  . Alcohol use: No    Alcohol/week: 0.0 standard drinks  . Drug use: No     Colonoscopy:  PAP:  Bone density:  Lipid panel:  Allergies  Allergen Reactions  . Revlimid [Lenalidomide]     Current Outpatient Medications  Medication Sig Dispense Refill  . aspirin EC 81 MG tablet Take 81 mg by mouth daily.    . Cyanocobalamin (B-12 PO) Take 500 mcg by mouth daily.     Marland Kitchen EPINEPHrine 0.3 mg/0.3 mL IJ SOAJ injection     . fexofenadine (ALLEGRA) 30 MG tablet Take 30 mg by mouth daily.     Marland Kitchen levothyroxine (SYNTHROID) 50 MCG tablet TAKE 1 TABLET BY MOUTH DAILY BEFORE BREAKFAST 90 tablet 3  . Multiple Vitamins-Minerals (CENTRUM SILVER PO) Take 1 tablet by mouth daily.    . pomalidomide (POMALYST) 2 MG capsule TAKE 1 CAPSULE BY MOUTH EVERY DAY FOR 21 DAYS FOLLOWED BY 7 DAYS OFF 21 capsule 0  . sulfamethoxazole-trimethoprim (BACTRIM) 400-80 MG tablet Take 1 tablet by mouth 2 (two) times daily. 20 tablet 0  . triamcinolone (NASACORT  ALLERGY 24HR) 55 MCG/ACT AERO nasal inhaler Place 2 sprays into the nose 2 (two) times daily as needed.     Marland Kitchen UNABLE TO FIND Inject 1 Dose as directed once a week. Allergy injections once a week      No current facility-administered medications for this visit.   Facility-Administered Medications Ordered in Other Visits  Medication Dose Route Frequency Provider Last Rate Last Admin  . 0.9 %  sodium chloride infusion   Intravenous Continuous Lloyd Huger, MD 10 mL/hr at 02/02/20 1411 New Bag at 02/02/20 1411    OBJECTIVE: Vitals:   02/02/20 1315  BP: 121/72  Pulse: 69  Resp: 18  Temp: 97.6 F (36.4 C)  SpO2: 98%     Body mass index is 23.16 kg/m.    ECOG FS:0 - Asymptomatic  General: Well-developed, well-nourished, no acute distress. HEENT: Normocephalic. Neuro: Alert, answering all questions appropriately. Cranial nerves grossly intact. Psych: Normal affect.   LAB RESULTS:  Lab Results  Component Value Date   NA 138 02/02/2020   K 4.2  02/02/2020   CL 108 02/02/2020   CO2 22 02/02/2020   GLUCOSE 90 02/02/2020   BUN 22 02/02/2020   CREATININE 1.08 (H) 02/02/2020   CALCIUM 8.7 (L) 02/02/2020   PROT 6.5 03/04/2017   ALBUMIN 4.2 03/04/2017   AST 24 03/04/2017   ALT 16 03/04/2017   ALKPHOS 49 03/04/2017   BILITOT 0.5 03/04/2017   GFRNONAA 47 (L) 02/02/2020   GFRAA 55 (L) 02/02/2020    Lab Results  Component Value Date   WBC 3.8 (L) 02/02/2020   NEUTROABS 1.4 (L) 02/02/2020   HGB 11.5 (L) 02/02/2020   HCT 33.4 (L) 02/02/2020   MCV 94.1 02/02/2020   PLT 167 02/02/2020   Lab Results  Component Value Date   TOTALPROTELP 5.9 (L) 01/05/2020   ALBUMINELP 3.6 01/05/2020   A1GS 0.2 01/05/2020   A2GS 0.7 01/05/2020   BETS 0.8 01/05/2020   GAMS 0.6 01/05/2020   MSPIKE 0.3 (H) 01/05/2020   SPEI Comment 01/05/2020     STUDIES: No results found.  ASSESSMENT:  Multiple myeloma in relapse.  PLAN:    1.  Multiple myeloma in relapse: Bone marrow biopsy on  April 09, 2016 revealed 44% plasma cells and normal cytogenetics. Given the results of her metastatic bone survey on March 12, 2016, her elevated lambda free chains, and hypercalcemia, patient fit the criteria for multiple myeloma and was initially treated with single agent Revlimid.  Patient's most recent M spike is unchanged at 0.3. IgG, IgM, and IgA are all mildly decreased and unchanged.  Lambda free light chains continue to trend down and are now 2788.3.  Today's results are pending.  Metastatic bone survey on February 12, 2019 revealed new small lesions in her calvarium.  Patient could not tolerate Revlimid and was switched to pomalidomide 2 mg daily for 21 days with a 7-day break.  Proceed with cycle 6 of pomalidomide today.  Proceed with Zometa today.  Return to clinic in 4 weeks for further evaluation and continuation of treatment.  3.  Anemia: Chronic and unchanged.  Patient's hemoglobin is 11.5. 4.  Leukopenia: Chronic and unchanged. 5.  Thrombocytopenia: Resolved. 6.  Peripheral neuropathy: Patient was previously given a referral to acupuncture which she said did not help much.  7.  Constipation: Patient does not complain of this today.  Continue OTC remedies as needed. 8.  Renal insufficiency: Improved, monitor. 9.  Hypercalcemia: Resolved.  Proceed with Zometa as above.  I provided 30 minutes of face-to-face video visit time during this encounter which included chart review, counseling, and coordination of care as documented above.   Patient expressed understanding and was in agreement with this plan. She also understands that She can call clinic at any time with any questions, concerns, or complaints.    Lloyd Huger, MD 02/02/20 2:50 PM

## 2020-01-29 DIAGNOSIS — J301 Allergic rhinitis due to pollen: Secondary | ICD-10-CM | POA: Diagnosis not present

## 2020-02-01 ENCOUNTER — Encounter: Payer: Self-pay | Admitting: Oncology

## 2020-02-01 NOTE — Progress Notes (Signed)
Patient stated she does not have any concerns at the moment

## 2020-02-02 ENCOUNTER — Inpatient Hospital Stay: Payer: Medicare PPO

## 2020-02-02 ENCOUNTER — Other Ambulatory Visit: Payer: Self-pay

## 2020-02-02 ENCOUNTER — Inpatient Hospital Stay: Payer: Medicare PPO | Attending: Oncology

## 2020-02-02 ENCOUNTER — Inpatient Hospital Stay (HOSPITAL_BASED_OUTPATIENT_CLINIC_OR_DEPARTMENT_OTHER): Payer: Medicare PPO | Admitting: Oncology

## 2020-02-02 VITALS — BP 121/72 | HR 69 | Temp 97.6°F | Resp 18 | Wt 126.6 lb

## 2020-02-02 DIAGNOSIS — K5900 Constipation, unspecified: Secondary | ICD-10-CM | POA: Diagnosis not present

## 2020-02-02 DIAGNOSIS — C9000 Multiple myeloma not having achieved remission: Secondary | ICD-10-CM | POA: Diagnosis not present

## 2020-02-02 DIAGNOSIS — Z85820 Personal history of malignant melanoma of skin: Secondary | ICD-10-CM | POA: Diagnosis not present

## 2020-02-02 DIAGNOSIS — D72819 Decreased white blood cell count, unspecified: Secondary | ICD-10-CM | POA: Insufficient documentation

## 2020-02-02 DIAGNOSIS — G629 Polyneuropathy, unspecified: Secondary | ICD-10-CM | POA: Insufficient documentation

## 2020-02-02 DIAGNOSIS — D649 Anemia, unspecified: Secondary | ICD-10-CM | POA: Diagnosis not present

## 2020-02-02 DIAGNOSIS — C9002 Multiple myeloma in relapse: Secondary | ICD-10-CM | POA: Insufficient documentation

## 2020-02-02 DIAGNOSIS — N289 Disorder of kidney and ureter, unspecified: Secondary | ICD-10-CM | POA: Diagnosis not present

## 2020-02-02 DIAGNOSIS — C9001 Multiple myeloma in remission: Secondary | ICD-10-CM

## 2020-02-02 LAB — CBC WITH DIFFERENTIAL/PLATELET
Abs Immature Granulocytes: 0.01 10*3/uL (ref 0.00–0.07)
Basophils Absolute: 0.1 10*3/uL (ref 0.0–0.1)
Basophils Relative: 3 %
Eosinophils Absolute: 0.1 10*3/uL (ref 0.0–0.5)
Eosinophils Relative: 1 %
HCT: 33.4 % — ABNORMAL LOW (ref 36.0–46.0)
Hemoglobin: 11.5 g/dL — ABNORMAL LOW (ref 12.0–15.0)
Immature Granulocytes: 0 %
Lymphocytes Relative: 35 %
Lymphs Abs: 1.3 10*3/uL (ref 0.7–4.0)
MCH: 32.4 pg (ref 26.0–34.0)
MCHC: 34.4 g/dL (ref 30.0–36.0)
MCV: 94.1 fL (ref 80.0–100.0)
Monocytes Absolute: 0.9 10*3/uL (ref 0.1–1.0)
Monocytes Relative: 24 %
Neutro Abs: 1.4 10*3/uL — ABNORMAL LOW (ref 1.7–7.7)
Neutrophils Relative %: 37 %
Platelets: 167 10*3/uL (ref 150–400)
RBC: 3.55 MIL/uL — ABNORMAL LOW (ref 3.87–5.11)
RDW: 13.2 % (ref 11.5–15.5)
WBC: 3.8 10*3/uL — ABNORMAL LOW (ref 4.0–10.5)
nRBC: 0 % (ref 0.0–0.2)

## 2020-02-02 LAB — BASIC METABOLIC PANEL
Anion gap: 8 (ref 5–15)
BUN: 22 mg/dL (ref 8–23)
CO2: 22 mmol/L (ref 22–32)
Calcium: 8.7 mg/dL — ABNORMAL LOW (ref 8.9–10.3)
Chloride: 108 mmol/L (ref 98–111)
Creatinine, Ser: 1.08 mg/dL — ABNORMAL HIGH (ref 0.44–1.00)
GFR calc Af Amer: 55 mL/min — ABNORMAL LOW (ref >60)
GFR calc non Af Amer: 47 mL/min — ABNORMAL LOW (ref >60)
Glucose, Bld: 90 mg/dL (ref 70–99)
Potassium: 4.2 mmol/L (ref 3.5–5.1)
Sodium: 138 mmol/L (ref 135–145)

## 2020-02-02 MED ORDER — SODIUM CHLORIDE 0.9 % IV SOLN
INTRAVENOUS | Status: DC
  Filled 2020-02-02: qty 250

## 2020-02-02 MED ORDER — ZOLEDRONIC ACID 4 MG/5ML IV CONC
3.0000 mg | Freq: Once | INTRAVENOUS | Status: AC
  Administered 2020-02-02: 3 mg via INTRAVENOUS
  Filled 2020-02-02: qty 3.75

## 2020-02-03 LAB — PROTEIN ELECTROPHORESIS, SERUM
A/G Ratio: 1.6 (ref 0.7–1.7)
Albumin ELP: 3.6 g/dL (ref 2.9–4.4)
Alpha-1-Globulin: 0.2 g/dL (ref 0.0–0.4)
Alpha-2-Globulin: 0.7 g/dL (ref 0.4–1.0)
Beta Globulin: 0.8 g/dL (ref 0.7–1.3)
Gamma Globulin: 0.6 g/dL (ref 0.4–1.8)
Globulin, Total: 2.2 g/dL (ref 2.2–3.9)
M-Spike, %: 0.2 g/dL — ABNORMAL HIGH
Total Protein ELP: 5.8 g/dL — ABNORMAL LOW (ref 6.0–8.5)

## 2020-02-03 LAB — IGG, IGA, IGM
IgA: 25 mg/dL — ABNORMAL LOW (ref 64–422)
IgG (Immunoglobin G), Serum: 487 mg/dL — ABNORMAL LOW (ref 586–1602)
IgM (Immunoglobulin M), Srm: 13 mg/dL — ABNORMAL LOW (ref 26–217)

## 2020-02-03 LAB — KAPPA/LAMBDA LIGHT CHAINS
Kappa free light chain: 19.7 mg/L — ABNORMAL HIGH (ref 3.3–19.4)
Kappa, lambda light chain ratio: 0.01 — ABNORMAL LOW (ref 0.26–1.65)
Lambda free light chains: 2302.7 mg/L — ABNORMAL HIGH (ref 5.7–26.3)

## 2020-02-05 DIAGNOSIS — J301 Allergic rhinitis due to pollen: Secondary | ICD-10-CM | POA: Diagnosis not present

## 2020-02-11 ENCOUNTER — Other Ambulatory Visit: Payer: Self-pay | Admitting: Oncology

## 2020-02-11 DIAGNOSIS — C9000 Multiple myeloma not having achieved remission: Secondary | ICD-10-CM

## 2020-02-12 DIAGNOSIS — J301 Allergic rhinitis due to pollen: Secondary | ICD-10-CM | POA: Diagnosis not present

## 2020-02-17 ENCOUNTER — Ambulatory Visit: Payer: Medicare PPO | Admitting: Dermatology

## 2020-02-17 ENCOUNTER — Encounter: Payer: Self-pay | Admitting: Dermatology

## 2020-02-17 ENCOUNTER — Other Ambulatory Visit: Payer: Self-pay

## 2020-02-17 DIAGNOSIS — Z85828 Personal history of other malignant neoplasm of skin: Secondary | ICD-10-CM

## 2020-02-17 DIAGNOSIS — L821 Other seborrheic keratosis: Secondary | ICD-10-CM | POA: Diagnosis not present

## 2020-02-17 DIAGNOSIS — Z1283 Encounter for screening for malignant neoplasm of skin: Secondary | ICD-10-CM

## 2020-02-17 DIAGNOSIS — Z85820 Personal history of malignant melanoma of skin: Secondary | ICD-10-CM

## 2020-02-17 DIAGNOSIS — D1800 Hemangioma unspecified site: Secondary | ICD-10-CM | POA: Diagnosis not present

## 2020-02-17 DIAGNOSIS — L814 Other melanin hyperpigmentation: Secondary | ICD-10-CM | POA: Diagnosis not present

## 2020-02-17 DIAGNOSIS — L570 Actinic keratosis: Secondary | ICD-10-CM

## 2020-02-17 DIAGNOSIS — L820 Inflamed seborrheic keratosis: Secondary | ICD-10-CM

## 2020-02-17 DIAGNOSIS — D229 Melanocytic nevi, unspecified: Secondary | ICD-10-CM | POA: Diagnosis not present

## 2020-02-17 DIAGNOSIS — L578 Other skin changes due to chronic exposure to nonionizing radiation: Secondary | ICD-10-CM

## 2020-02-17 NOTE — Progress Notes (Signed)
Follow-Up Visit   Subjective  Rhonda [name] is a 86 y.o. female who presents for the following: Annual Exam (History of Melanoma/SCC/BCC/AK - TBSE today) and Other (Spot of right forearm x few months. Gets red and scaly then goes down). The patient presents for Total-Body Skin Exam (TBSE) for skin cancer screening and mole check.  The following portions of the chart were reviewed this encounter and updated as appropriate:  Tobacco  Allergies  Meds  Problems  Med Hx  Surg Hx  Fam Hx     Review of Systems:  No other skin or systemic complaints except as noted in HPI or Assessment and Plan.  Objective  Well appearing patient in no apparent distress; mood and affect are within normal limits.  A full examination was performed including scalp, head, eyes, ears, nose, lips, neck, chest, axillae, abdomen, back, buttocks, bilateral upper extremities, bilateral lower extremities, hands, feet, fingers, toes, fingernails, and toenails. All findings within normal limits unless otherwise noted below.  Objective  Right mid to distal ant lat thigh: Well healed excision site. No lymphadenopathy.  Objective  Face (5): Erythematous thin papules/macules with gritty scale.   Objective  arms, legs (5): Erythematous keratotic or waxy stuck-on papule or plaque.    Assessment & Plan    Lentigines - Scattered tan macules - Discussed due to sun exposure - Benign, observe - Call for any changes  Seborrheic Keratoses - Stuck-on, waxy, tan-brown papules and plaques  - Discussed benign etiology and prognosis. - Observe - Call for any changes  Melanocytic Nevi - Tan-brown and/or pink-flesh-colored symmetric macules and papules - Benign appearing on exam today - Observation - Call clinic for new or changing moles - Recommend daily use of broad spectrum spf 30+ sunscreen to sun-exposed areas.   Hemangiomas - Red papules - Discussed benign nature - Observe - Call for any  changes  Actinic Damage - diffuse scaly erythematous macules with underlying dyspigmentation - Recommend daily broad spectrum sunscreen SPF 30+ to sun-exposed areas, reapply every 2 hours as needed.  - Call for new or changing lesions.  Skin cancer screening performed today.  History of Basal Cell Carcinoma of the Skin - No evidence of recurrence today - Recommend regular full body skin exams - Recommend daily broad spectrum sunscreen SPF 30+ to sun-exposed areas, reapply every 2 hours as needed.  - Call if any new or changing lesions are noted between office visits  History of Squamous Cell Carcinoma of the Skin - No evidence of recurrence today - No lymphadenopathy - Recommend regular full body skin exams - Recommend daily broad spectrum sunscreen SPF 30+ to sun-exposed areas, reapply every 2 hours as needed.  - Call if any new or changing lesions are noted between office visits  History of melanoma Right mid to distal ant lat thigh No Lymphadenopathy Clear. Observe for recurrence. Call clinic for new or changing lesions.  Recommend regular skin exams, daily broad-spectrum spf 30+ sunscreen use, and photoprotection.     AK (actinic keratosis) (5) Face  Destruction of lesion - Face Complexity: simple   Destruction method: cryotherapy   Informed consent: discussed and consent obtained   Timeout:  patient name, date of birth, surgical site, and procedure verified Lesion destroyed using liquid nitrogen: Yes   Region frozen until ice ball extended beyond lesion: Yes   Outcome: patient tolerated procedure well with no complications   Post-procedure details: wound care instructions given    Inflamed seborrheic keratosis (5) arms, legs  Destruction of lesion - arms, legs Complexity: simple   Destruction method: cryotherapy   Informed consent: discussed and consent obtained   Timeout:  patient name, date of birth, surgical site, and procedure verified Lesion destroyed using  liquid nitrogen: Yes   Region frozen until ice ball extended beyond lesion: Yes   Outcome: patient tolerated procedure well with no complications   Post-procedure details: wound care instructions given    Return in about 4 months (around 06/18/2020).   I, Ashok Cordia, CMA, am acting as scribe for Sarina Ser, MD .  Documentation: I have reviewed the above documentation for accuracy and completeness, and I agree with the above.  Sarina Ser, MD

## 2020-02-17 NOTE — Patient Instructions (Signed)

## 2020-02-18 ENCOUNTER — Encounter: Payer: Self-pay | Admitting: Dermatology

## 2020-02-19 DIAGNOSIS — J301 Allergic rhinitis due to pollen: Secondary | ICD-10-CM | POA: Diagnosis not present

## 2020-02-28 NOTE — Progress Notes (Signed)
[place] [facility]  [phone] [phone]  ID: Rhonda [name] OB: Dec 29, 1933  MR#: [id number]  EQA#:[id number]  Patient Care Team: Lavera Guise, MD as PCP - General (Internal Medicine) Lloyd Huger, MD as Consulting Physician (Oncology)   CHIEF COMPLAINT:  Multiple myeloma in relapse.  INTERVAL HISTORY: Patient returns to clinic today for further evaluation and continuation of Zometa and pomalidomide.  She continues to have chronic weakness and fatigue, but otherwise is tolerating her treatments well.  She has a mild peripheral neuropathy.  She has no other neurologic complaints. She denies any recent fevers or illnesses. She has a good appetite and denies weight loss.  She denies any chest [name], shortness of breath, cough, or hemoptysis.  She denies any nausea, vomiting, constipation, or diarrhea.  She has no melena or hematochezia.  She has no urinary complaints.  Patient offers no further specific complaints today.  REVIEW OF SYSTEMS:   Review of Systems  Constitutional: Positive for malaise/fatigue. Negative for fever and weight loss.  HENT: Negative for congestion and sore throat.   Respiratory: Negative.  Negative for cough and shortness of breath.   Cardiovascular: Negative.  Negative for chest [name], palpitations and leg swelling.  Gastrointestinal: Negative.  Negative for abdominal [name], blood in stool, constipation, diarrhea, melena, nausea and vomiting.  Genitourinary: Negative.  Negative for dysuria.  Musculoskeletal: Negative.  Negative for back [name].  Skin: Negative.  Negative for rash.  Neurological: Positive for tingling, sensory change and weakness. Negative for focal weakness.  Psychiatric/Behavioral: Negative.  The patient is not nervous/anxious and does not have insomnia.     As per HPI. Otherwise, a complete review of systems is negative.  PAST MEDICAL HISTORY: Past Medical History:  Diagnosis Date  . Actinic keratosis    . Arthritis    hands  . Benign neoplasm of ascending colon   . Cataract   . GERD (gastroesophageal reflux disease)   . Hyperlipemia   . Hypertension 01/31/2015  . Hypothyroidism   . Melanoma ([place]) 04/28/2018   R mid to distal ant lat thigh. MM, SS, tumor thickness 0.60m, antatomic level II. Excised: 05/13/18, margins free  . [place]18[place]  . [place]/18[place]  . Osteoporosis   . Shingles   . Skin cancer    basal cell  . Squamous cell carcinoma of skin 02/04/2014   Left pretibial. KA-like pattern  . Squamous cell carcinoma of skin 09/15/2015   Right lat. superior ankle are. Superficial infiltration. Tx: EDC  . Squamous cell carcinoma of skin 10/15/2016   Left medial mid lower leg. Tx: EDC  . Squamous cell carcinoma of skin 11/27/2018   Right dorsum proximal forearm.  . Squamous cell carcinoma of skin 09/09/2019   Right cheek lat. to mid nasolabial area. Tx: EDC  . Squamous cell carcinoma of skin 12/09/2019   right pretibial below knee    PAST SURGICAL HISTORY: Past Surgical History:  Procedure Laterality Date  . BREAST EXCISIONAL BIOPSY Right 15+ yrs ago   EXCISIONAL - NEG  . CATARACT EXTRACTION W/ INTRAOCULAR LENS IMPLANT    . CHOLECYSTECTOMY N/A 03/14/2017   Procedure: LAPAROSCOPIC CHOLECYSTECTOMY;  Surgeon: CFlorene Glen MD;  Location: [facility];  Service: General;  Laterality: N/A;  . COLONOSCOPY WITH PROPOFOL N/A 02/21/2015   Procedure: COLONOSCOPY WITH PROPOFOL;  Surgeon: DLucilla Lame MD;  [facility]  Service: Endoscopy;  Laterality: N/A;  . [place] POL[place]  02/21/2015   Procedure: POLYPECTOMY;  Surgeon: Lucilla Lame, MD;  Location: [facility];  Service: Endoscopy;;    FAMILY HISTORY: Family History  Problem Relation Age of Onset  . Heart disease Mother   . Stroke Father   . Heart disease Brother   . Kidney cancer Neg Hx   . Prostate cancer Neg Hx   .  Bladder Cancer Neg Hx   . Breast cancer Neg Hx     ADVANCED DIRECTIVES (Y/N):  N  HEALTH MAINTENANCE: Social History   Tobacco Use  . Smoking status: Never Smoker  . Smokeless tobacco: Never Used  Vaping Use  . Vaping Use: Never used  Substance Use Topics  . Alcohol use: No    Alcohol/week: 0.0 standard drinks  . Drug use: No     Colonoscopy:  PAP:  Bone density:  Lipid panel:  Allergies  Allergen Reactions  . Revlimid [Lenalidomide]     Current Outpatient Medications  Medication Sig Dispense Refill  . aspirin EC 81 MG tablet Take 81 mg by mouth daily.    . Cyanocobalamin (B-12 PO) Take 500 mcg by mouth daily.     Marland Kitchen EPINEPHrine 0.3 mg/0.3 mL IJ SOAJ injection     . fexofenadine (ALLEGRA) 30 MG tablet Take 30 mg by mouth daily.     Marland Kitchen levothyroxine (SYNTHROID) 50 MCG tablet TAKE 1 TABLET BY MOUTH DAILY BEFORE BREAKFAST 90 tablet 3  . Multiple Vitamins-Minerals (CENTRUM SILVER PO) Take 1 tablet by mouth daily.    Marland Kitchen POMALYST 2 MG capsule TAKE 1 CAPSULE BY MOUTH EVERY DAY FOR 21 DAYS, FOLLOWED BY 7 DAYS OFF 21 capsule 0  . sulfamethoxazole-trimethoprim (BACTRIM) 400-80 MG tablet Take 1 tablet by mouth 2 (two) times daily. 20 tablet 0  . triamcinolone (NASACORT ALLERGY 24HR) 55 MCG/ACT AERO nasal inhaler Place 2 sprays into the nose 2 (two) times daily as needed.     Marland Kitchen UNABLE TO FIND Inject 1 Dose as directed once a week. Allergy injections once a week      No current facility-administered medications for this visit.    OBJECTIVE: Vitals:   03/01/20 1320  BP: (!) 116/51  Pulse: 70  Resp: 20  Temp: 98.7 F (37.1 C)  SpO2: 100%     Body mass index is 22.81 kg/m.    ECOG FS:0 - Asymptomatic  General: Well-developed, well-nourished, no acute distress. Eyes: Pink conjunctiva, anicteric sclera. HEENT: Normocephalic, moist mucous membranes. Lungs: No audible wheezing or coughing. Heart: Regular rate and rhythm. Abdomen: Soft, nontender, no obvious  distention. Musculoskeletal: No edema, cyanosis, or clubbing. Neuro: Alert, answering all questions appropriately. Cranial nerves grossly intact. Skin: No rashes or petechiae noted. Psych: Normal affect.   LAB RESULTS:  Lab Results  Component Value Date   NA 138 03/01/2020   K 3.7 03/01/2020   CL 109 03/01/2020   CO2 21 (L) 03/01/2020   GLUCOSE 114 (H) 03/01/2020   BUN 20 03/01/2020   CREATININE 1.39 (H) 03/01/2020   CALCIUM 8.4 (L) 03/01/2020   PROT 6.5 03/04/2017   ALBUMIN 4.2 03/04/2017   AST 24 03/04/2017   ALT 16 03/04/2017   ALKPHOS 49 03/04/2017   BILITOT 0.5 03/04/2017   GFRNONAA 35 (L) 03/01/2020   GFRAA 40 (L) 03/01/2020    Lab Results  Component Value Date   WBC 3.6 (L) 03/01/2020   NEUTROABS 1.5 (L) 03/01/2020   HGB 11.0 (L) 03/01/2020   HCT 31.5 (L) 03/01/2020   MCV 93.2 03/01/2020  PLT 177 03/01/2020   Lab Results  Component Value Date   TOTALPROTELP 5.8 (L) 02/02/2020   ALBUMINELP 3.6 02/02/2020   A1GS 0.2 02/02/2020   A2GS 0.7 02/02/2020   BETS 0.8 02/02/2020   GAMS 0.6 02/02/2020   MSPIKE 0.2 (H) 02/02/2020   SPEI Comment 02/02/2020     STUDIES: No results found.  ASSESSMENT:  Multiple myeloma in relapse.  PLAN:    1.  Multiple myeloma in relapse: Bone marrow biopsy on April 09, 2016 revealed 44% plasma cells and normal cytogenetics. Given the results of her metastatic bone survey on March 12, 2016, her elevated lambda free chains, and hypercalcemia, patient fit the criteria for multiple myeloma and was initially treated with single agent Revlimid. Metastatic bone survey on February 12, 2019 revealed new small lesions in her calvarium.  Patient's M spike remains unchanged at 0.2.  IgG, IgM, and IgA are all mildly decreased and unchanged.  Lambda free light chains are slowly trending down to 2309, but this is unchanged from 1 month ago.   Patient could not tolerate Revlimid and was switched to pomalidomide 2 mg daily for 21 days with a  7-day break.  Proceed with cycle 7 of pomalidomide today.  Continued with dose reduced Zometa secondary to her renal insufficiency.  Will consider increasing dose of pomalidomide to 4 mg daily every 21 days.  Return to clinic in 4 weeks for further evaluation and continuation of treatment.   2.  Chronic renal insufficiency: Patient's creatinine is trended up slightly to 1.39.  Dose reduced Zometa as above. 3.  Anemia: Chronic and unchanged.  Patient's hemoglobin is 11.0. 4.  Leukopenia: Chronic and unchanged. 5.  Thrombocytopenia: Resolved. 6.  Peripheral neuropathy: Patient was previously given a referral to acupuncture which she said did not help much.  7.  Constipation: Patient does not complain of this today.  Continue OTC remedies as needed. 8.  Hypercalcemia: Resolved.  Proceed with Zometa as above.    Patient expressed understanding and was in agreement with this plan. She also understands that She can call clinic at any time with any questions, concerns, or complaints.    Lloyd Huger, MD 03/02/20 3:06 PM

## 2020-03-01 ENCOUNTER — Other Ambulatory Visit: Payer: Self-pay

## 2020-03-01 ENCOUNTER — Encounter: Payer: Self-pay | Admitting: Oncology

## 2020-03-01 ENCOUNTER — Inpatient Hospital Stay: Payer: Medicare PPO

## 2020-03-01 ENCOUNTER — Inpatient Hospital Stay (HOSPITAL_BASED_OUTPATIENT_CLINIC_OR_DEPARTMENT_OTHER): Payer: Medicare PPO | Admitting: Oncology

## 2020-03-01 ENCOUNTER — Inpatient Hospital Stay: Payer: Medicare PPO | Attending: Oncology

## 2020-03-01 VITALS — BP 116/51 | HR 70 | Temp 98.7°F | Resp 20 | Ht 62.0 in | Wt 124.7 lb

## 2020-03-01 DIAGNOSIS — G629 Polyneuropathy, unspecified: Secondary | ICD-10-CM | POA: Diagnosis not present

## 2020-03-01 DIAGNOSIS — D649 Anemia, unspecified: Secondary | ICD-10-CM | POA: Insufficient documentation

## 2020-03-01 DIAGNOSIS — C9000 Multiple myeloma not having achieved remission: Secondary | ICD-10-CM

## 2020-03-01 DIAGNOSIS — K5900 Constipation, unspecified: Secondary | ICD-10-CM | POA: Insufficient documentation

## 2020-03-01 DIAGNOSIS — N289 Disorder of kidney and ureter, unspecified: Secondary | ICD-10-CM | POA: Diagnosis not present

## 2020-03-01 DIAGNOSIS — D72819 Decreased white blood cell count, unspecified: Secondary | ICD-10-CM | POA: Insufficient documentation

## 2020-03-01 DIAGNOSIS — C9002 Multiple myeloma in relapse: Secondary | ICD-10-CM | POA: Insufficient documentation

## 2020-03-01 DIAGNOSIS — Z85820 Personal history of malignant melanoma of skin: Secondary | ICD-10-CM | POA: Diagnosis not present

## 2020-03-01 DIAGNOSIS — C9001 Multiple myeloma in remission: Secondary | ICD-10-CM

## 2020-03-01 LAB — CBC WITH DIFFERENTIAL/PLATELET
Abs Immature Granulocytes: 0.01 10*3/uL (ref 0.00–0.07)
Basophils Absolute: 0.1 10*3/uL (ref 0.0–0.1)
Basophils Relative: 3 %
Eosinophils Absolute: 0 10*3/uL (ref 0.0–0.5)
Eosinophils Relative: 0 %
HCT: 31.5 % — ABNORMAL LOW (ref 36.0–46.0)
Hemoglobin: 11 g/dL — ABNORMAL LOW (ref 12.0–15.0)
Immature Granulocytes: 0 %
Lymphocytes Relative: 32 %
Lymphs Abs: 1.2 10*3/uL (ref 0.7–4.0)
MCH: 32.5 pg (ref 26.0–34.0)
MCHC: 34.9 g/dL (ref 30.0–36.0)
MCV: 93.2 fL (ref 80.0–100.0)
Monocytes Absolute: 0.9 10*3/uL (ref 0.1–1.0)
Monocytes Relative: 25 %
Neutro Abs: 1.5 10*3/uL — ABNORMAL LOW (ref 1.7–7.7)
Neutrophils Relative %: 40 %
Platelets: 177 10*3/uL (ref 150–400)
RBC: 3.38 MIL/uL — ABNORMAL LOW (ref 3.87–5.11)
RDW: 13.8 % (ref 11.5–15.5)
WBC: 3.6 10*3/uL — ABNORMAL LOW (ref 4.0–10.5)
nRBC: 0 % (ref 0.0–0.2)

## 2020-03-01 LAB — BASIC METABOLIC PANEL
Anion gap: 8 (ref 5–15)
BUN: 20 mg/dL (ref 8–23)
CO2: 21 mmol/L — ABNORMAL LOW (ref 22–32)
Calcium: 8.4 mg/dL — ABNORMAL LOW (ref 8.9–10.3)
Chloride: 109 mmol/L (ref 98–111)
Creatinine, Ser: 1.39 mg/dL — ABNORMAL HIGH (ref 0.44–1.00)
GFR calc Af Amer: 40 mL/min — ABNORMAL LOW (ref >60)
GFR calc non Af Amer: 35 mL/min — ABNORMAL LOW (ref >60)
Glucose, Bld: 114 mg/dL — ABNORMAL HIGH (ref 70–99)
Potassium: 3.7 mmol/L (ref 3.5–5.1)
Sodium: 138 mmol/L (ref 135–145)

## 2020-03-01 MED ORDER — SODIUM CHLORIDE 0.9 % IV SOLN
Freq: Once | INTRAVENOUS | Status: AC
  Filled 2020-03-01: qty 250

## 2020-03-01 MED ORDER — ZOLEDRONIC ACID 4 MG/5ML IV CONC
3.0000 mg | Freq: Once | INTRAVENOUS | Status: AC
  Administered 2020-03-01: 3 mg via INTRAVENOUS
  Filled 2020-03-01: qty 3.75

## 2020-03-01 NOTE — Progress Notes (Signed)
Creatinine: 1.39, Calcium: 8.4. MD, Dr. Grayland Ormond, notified and aware. Per MD order: proceed with scheduled Zometa treatment today.

## 2020-03-01 NOTE — Progress Notes (Signed)
Patient denies any pain or concerns today. States she would like to discuss getting 3rd covid shot with provider.

## 2020-03-02 LAB — PROTEIN ELECTROPHORESIS, SERUM
A/G Ratio: 1.7 (ref 0.7–1.7)
Albumin ELP: 3.5 g/dL (ref 2.9–4.4)
Alpha-1-Globulin: 0.2 g/dL (ref 0.0–0.4)
Alpha-2-Globulin: 0.6 g/dL (ref 0.4–1.0)
Beta Globulin: 0.8 g/dL (ref 0.7–1.3)
Gamma Globulin: 0.5 g/dL (ref 0.4–1.8)
Globulin, Total: 2.1 g/dL — ABNORMAL LOW (ref 2.2–3.9)
M-Spike, %: 0.2 g/dL — ABNORMAL HIGH
Total Protein ELP: 5.6 g/dL — ABNORMAL LOW (ref 6.0–8.5)

## 2020-03-02 LAB — KAPPA/LAMBDA LIGHT CHAINS
Kappa free light chain: 18.8 mg/L (ref 3.3–19.4)
Kappa, lambda light chain ratio: 0.01 — ABNORMAL LOW (ref 0.26–1.65)
Lambda free light chains: 2309.7 mg/L — ABNORMAL HIGH (ref 5.7–26.3)

## 2020-03-02 LAB — IGG, IGA, IGM
IgA: 25 mg/dL — ABNORMAL LOW (ref 64–422)
IgG (Immunoglobin G), Serum: 468 mg/dL — ABNORMAL LOW (ref 586–1602)
IgM (Immunoglobulin M), Srm: 12 mg/dL — ABNORMAL LOW (ref 26–217)

## 2020-03-04 DIAGNOSIS — J301 Allergic rhinitis due to pollen: Secondary | ICD-10-CM | POA: Diagnosis not present

## 2020-03-09 DIAGNOSIS — J301 Allergic rhinitis due to pollen: Secondary | ICD-10-CM | POA: Diagnosis not present

## 2020-03-10 ENCOUNTER — Ambulatory Visit (INDEPENDENT_AMBULATORY_CARE_PROVIDER_SITE_OTHER): Payer: Medicare PPO | Admitting: Hospice and Palliative Medicine

## 2020-03-10 ENCOUNTER — Other Ambulatory Visit: Payer: Self-pay

## 2020-03-10 ENCOUNTER — Encounter: Payer: Self-pay | Admitting: Hospice and Palliative Medicine

## 2020-03-10 ENCOUNTER — Other Ambulatory Visit: Payer: Self-pay | Admitting: Oncology

## 2020-03-10 DIAGNOSIS — I10 Essential (primary) hypertension: Secondary | ICD-10-CM

## 2020-03-10 DIAGNOSIS — Z0001 Encounter for general adult medical examination with abnormal findings: Secondary | ICD-10-CM | POA: Diagnosis not present

## 2020-03-10 DIAGNOSIS — R300 Dysuria: Secondary | ICD-10-CM

## 2020-03-10 DIAGNOSIS — E039 Hypothyroidism, unspecified: Secondary | ICD-10-CM

## 2020-03-10 DIAGNOSIS — C9000 Multiple myeloma not having achieved remission: Secondary | ICD-10-CM

## 2020-03-10 DIAGNOSIS — C9002 Multiple myeloma in relapse: Secondary | ICD-10-CM | POA: Diagnosis not present

## 2020-03-10 NOTE — Progress Notes (Signed)
[facility] [place], [place] [address]  Internal MEDICINE  Office Visit Note  Patient Name: Rhonda [name]  [id number]  [id number]  Date of Service: 03/11/2020  Chief Complaint  Patient presents with  . Medicare Wellness  . Hypertension  . Hyperlipidemia  . Quality Metric Gaps    PNA     HPI Pt is here for routine health maintenance examination Since her last follow-up visit she has ben started back on chemotherapy pills-restarted in March Dr. Grayland Ormond is her oncologist (from his note 9/21)  Multiple myeloma in relapse: Bone marrow biopsy on April 09, 2016 revealed 44% plasma cells and normal cytogenetics. Given the results of her metastatic bone survey on March 12, 2016, her elevated lambda free chains, and hypercalcemia, patient fit the criteria for multiple myeloma and was initially treated with single agent Revlimid. Metastatic bone survey on February 12, 2019 revealed new small lesions in her calvarium.  Patient's M spike remains unchanged at 0.2.  IgG, IgM, and IgA are all mildly decreased and unchanged.  Lambda free light chains are slowly trending down to 2309, but this is unchanged from 1 month ago.   Patient could not tolerate Revlimid and was switched to pomalidomide 2 mg daily for 21 days with a 7-day break.  Proceed with cycle 7 of pomalidomide today.  Continued with dose reduced Zometa secondary to her renal insufficiency.  Will consider increasing dose of pomalidomide to 4 mg daily every 21 days.  Return to clinic in 4 weeks for further evaluation and continuation of treatment.   Overall she says she feels good, some days are better than others but she continues to be active. She has an appointment coming up for her mammogram, no longer has colonoscopies Continues to have a good appetite, no issues with going to bathroom, sleeping well at night, no complaints of pain  She is getting lab work done monthly, results reviewed  Current  Medication: Outpatient Encounter Medications as of 03/10/2020  Medication Sig  . aspirin EC 81 MG tablet Take 81 mg by mouth daily.  . Cyanocobalamin (B-12 PO) Take 500 mcg by mouth daily.   Marland Kitchen EPINEPHrine 0.3 mg/0.3 mL IJ SOAJ injection   . fexofenadine (ALLEGRA) 30 MG tablet Take 30 mg by mouth daily.   Marland Kitchen levothyroxine (SYNTHROID) 50 MCG tablet TAKE 1 TABLET BY MOUTH DAILY BEFORE BREAKFAST  . Multiple Vitamins-Minerals (CENTRUM SILVER PO) Take 1 tablet by mouth daily.  Marland Kitchen sulfamethoxazole-trimethoprim (BACTRIM) 400-80 MG tablet Take 1 tablet by mouth 2 (two) times daily.  Marland Kitchen triamcinolone (NASACORT ALLERGY 24HR) 55 MCG/ACT AERO nasal inhaler Place 2 sprays into the nose 2 (two) times daily as needed.   Marland Kitchen UNABLE TO FIND Inject 1 Dose as directed once a week. Allergy injections once a week   . [DISCONTINUED] POMALYST 2 MG capsule TAKE 1 CAPSULE (2MG TOTAL) BY MOUTH EVERY DAY FOR 21 DAYS, THEN HOLD FOR 7 DAYS   No facility-administered encounter medications on file as of 03/10/2020.    Surgical History: Past Surgical History:  Procedure Laterality Date  . BREAST EXCISIONAL BIOPSY Right 15+ yrs ago   EXCISIONAL - NEG  . CATARACT EXTRACTION W/ INTRAOCULAR LENS IMPLANT    . CHOLECYSTECTOMY N/A 03/14/2017   Procedure: LAPAROSCOPIC CHOLECYSTECTOMY;  Surgeon: Florene Glen, MD;  Location: [facility];  Service: General;  Laterality: N/A;  . COLONOSCOPY WITH PROPOFOL N/A 02/21/2015   Procedure: COLONOSCOPY WITH PROPOFOL;  Surgeon: Lucilla Lame, MD;  Location: [place]  [facility];  Service: Endoscopy;  Laterality: N/A;  . [place]  . POLYPECTOMY  02/21/2015   Procedure: POLYPECTOMY;  Surgeon: Lucilla Lame, MD;  Location: [place];  Service: Endoscopy;;    Medical History: Past Medical History:  Diagnosis Date  . Actinic keratosis   . Arthritis    hands  . Benign neoplasm of ascending colon   . Cataract   . GERD (gastroesophageal reflux disease)   .  Hyperlipemia   . Hypertension 01/31/2015  . Hypothyroidism   . Melanoma ([place]) 04/28/2018   R mid to distal ant lat thigh. MM, SS, tumor thickness 0.1m, antatomic level II. Excised: 05/13/18, margins free  . [place] 04/18/2016  . [place] 04/18/2016  . Osteoporosis   . Shingles   . Skin cancer    basal cell  . Squamous cell carcinoma of skin 02/04/2014   Left pretibial. KA-like pattern  . Squamous cell carcinoma of skin 09/15/2015   Right lat. superior ankle are. Superficial infiltration. Tx: EDC  . Squamous cell carcinoma of skin 10/15/2016   Left medial mid lower leg. Tx: EDC  . Squamous cell carcinoma of skin 11/27/2018   Right dorsum proximal forearm.  . Squamous cell carcinoma of skin 09/09/2019   Right cheek lat. to mid nasolabial area. Tx: EDC  . Squamous cell carcinoma of skin 12/09/2019   right pretibial below knee    Family History: Family History  Problem Relation Age of Onset  . Heart disease Mother   . Stroke Father   . Heart disease Brother   . Kidney cancer Neg Hx   . Prostate cancer Neg Hx   . Bladder Cancer Neg Hx   . Breast cancer Neg Hx     Review of Systems  Constitutional: Positive for fatigue. Negative for chills and diaphoresis.  HENT: Negative for ear pain, postnasal drip and sinus pressure.   Eyes: Negative for photophobia, discharge, redness, itching and visual disturbance.  Respiratory: Negative for cough, shortness of breath and wheezing.   Cardiovascular: Negative for chest pain, palpitations and leg swelling.  Gastrointestinal: Negative for abdominal pain, constipation, diarrhea, nausea and vomiting.  Genitourinary: Negative for dysuria and flank pain.  Musculoskeletal: Negative for arthralgias, back pain, gait problem and neck pain.  Skin: Negative for color change.  Allergic/Immunologic: Negative for environmental allergies and food allergies.  Neurological: Negative for dizziness and headaches.   Hematological: Does not bruise/bleed easily.  Psychiatric/Behavioral: Negative for agitation, behavioral problems (depression) and hallucinations.     Vital Signs: BP (!) 116/50   Pulse (!) 52   Temp (!) 97.3 F (36.3 C)   Resp 16   Ht _0  (1.575 m)   Wt 124 lb 12.8 oz (56.6 kg)   SpO2 97%   BMI 22.83 kg/m    Physical Exam Vitals reviewed.  Constitutional:      Appearance: Normal appearance. She is normal weight.  HENT:     Mouth/Throat:     Mouth: Mucous membranes are moist.     Pharynx: Oropharynx is clear.  Cardiovascular:     Rate and Rhythm: Normal rate and regular rhythm.     Pulses: Normal pulses.     Heart sounds: Normal heart sounds.  Pulmonary:     Effort: Pulmonary effort is normal.     Breath sounds: Normal breath sounds.  Chest:     Breasts:        Right: Normal.  Left: Normal.  Abdominal:     General: Abdomen is flat.  Musculoskeletal:        General: Normal range of motion.     Cervical back: Normal range of motion.  Neurological:     General: No focal deficit present.     Mental Status: She is alert and oriented to person, place, and time. Mental status is at baseline.  Psychiatric:        Mood and Affect: Mood normal.        Behavior: Behavior normal.        Thought Content: Thought content normal.    LABS: Recent Results (from the past 2160 hour(s))  Basic metabolic panel     Status: Abnormal   Collection Time: 01/05/20 10:41 AM  Result Value Ref Range   Sodium 138 135 - 145 mmol/L   Potassium 4.3 3.5 - 5.1 mmol/L   Chloride 107 98 - 111 mmol/L   CO2 22 22 - 32 mmol/L   Glucose, Bld 92 70 - 99 mg/dL    Comment: Glucose reference range applies only to samples taken after fasting for at least 8 hours.   BUN 24 (H) 8 - 23 mg/dL   Creatinine, Ser 1.20 (H) 0.44 - 1.00 mg/dL   Calcium 8.8 (L) 8.9 - 10.3 mg/dL   GFR calc non Af Amer 41 (L) >60 mL/min   GFR calc Af Amer 48 (L) >60 mL/min   Anion gap 9 5 - 15    Comment: Performed  at [facility], [address] [place], [place] [address]  CBC with Differential/Platelet     Status: Abnormal   Collection Time: 01/05/20 10:41 AM  Result Value Ref Range   WBC 3.4 (L) 4.0 - 10.5 K/uL   RBC 3.60 (L) 3.87 - 5.11 MIL/uL   Hemoglobin 11.6 (L) 12.0 - 15.0 g/dL   HCT 33.9 (L) 36 - 46 %   MCV 94.2 80.0 - 100.0 fL   MCH 32.2 26.0 - 34.0 pg   MCHC 34.2 30.0 - 36.0 g/dL   RDW 13.2 11.5 - 15.5 %   Platelets 166 150 - 400 K/uL   nRBC 0.0 0.0 - 0.2 %   Neutrophils Relative % 37 %   Neutro Abs 1.3 (L) 1.7 - 7.7 K/uL   Lymphocytes Relative 36 %   Lymphs Abs 1.2 0.7 - 4.0 K/uL   Monocytes Relative 24 %   Monocytes Absolute 0.8 0 - 1 K/uL   Eosinophils Relative 1 %   Eosinophils Absolute 0.0 0 - 0 K/uL   Basophils Relative 2 %   Basophils Absolute 0.1 0 - 0 K/uL   Immature Granulocytes 0 %   Abs Immature Granulocytes 0.01 0.00 - 0.07 K/uL    Comment: Performed at [facility], [address] [place], [place] [address]  Protein electrophoresis, serum     Status: Abnormal   Collection Time: 01/05/20 10:41 AM  Result Value Ref Range   Total Protein ELP 5.9 (L) 6.0 - 8.5 g/dL   Albumin ELP 3.6 2.9 - 4.4 g/dL   Alpha-1-Globulin 0.2 0.0 - 0.4 g/dL   Alpha-2-Globulin 0.7 0.4 - 1.0 g/dL   Beta Globulin 0.8 0.7 - 1.3 g/dL   Gamma Globulin 0.6 0.4 - 1.8 g/dL   M-Spike, % 0.3 (H) Not Observed g/dL   SPE Interp. Comment     Comment: (NOTE) The SPE pattern demonstrates a single peak (M-spike) in the gamma region which may represent monoclonal protein. This  peak may also be caused by circulating immune complexes, cryoglobulins, C-reactive protein, fibrinogen or hemolysis.  If clinically indicated, the presence of a monoclonal gammopathy may be confirmed by immuno- fixation, as well as an evaluation of the urine for the presence of Bence-Jones protein. Performed At: [facility] [place], [place] [id number] Rush Farmer MD PQ:[id number]     Comment Comment     Comment: (NOTE) Protein electrophoresis scan will follow via computer, mail, or courier delivery.    Globulin, Total 2.3 2.2 - 3.9 g/dL   A/G Ratio 1.6 0.7 - 1.7  Kappa/lambda light chains     Status: Abnormal   Collection Time: 01/05/20 10:41 AM  Result Value Ref Range   Kappa free light chain 19.7 (H) 3.3 - 19.4 mg/L   Lamda free light chains 2,788.3 (H) 5.7 - 26.3 mg/L   Kappa, lamda light chain ratio 0.01 (L) 0.26 - 1.65    Comment: (NOTE) Performed At: [facility] [address] [place], [place] [id number] Rush Farmer MD [id number]   IgG, IgA, IgM     Status: Abnormal   Collection Time: 01/05/20 10:41 AM  Result Value Ref Range   IgG (Immunoglobin G), Serum 495 (L) 586 - 1,602 mg/dL   IgA 25 (L) 64 - 422 mg/dL    Comment: Result confirmed on concentration.   IgM (Immunoglobulin M), Srm 12 (L) 26 - 217 mg/dL    Comment: (NOTE) Result confirmed on concentration. Performed At: [facility] [place], [place] [id number] Rush Farmer MD TD:[id number]   Basic metabolic panel     Status: Abnormal   Collection Time: 02/02/20  1:03 PM  Result Value Ref Range   Sodium 138 135 - 145 mmol/L   Potassium 4.2 3.5 - 5.1 mmol/L   Chloride 108 98 - 111 mmol/L   CO2 22 22 - 32 mmol/L   Glucose, Bld 90 70 - 99 mg/dL    Comment: Glucose reference range applies only to samples taken after fasting for at least 8 hours.   BUN 22 8 - 23 mg/dL   Creatinine, Ser 1.08 (H) 0.44 - 1.00 mg/dL   Calcium 8.7 (L) 8.9 - 10.3 mg/dL   GFR calc non Af Amer 47 (L) >60 mL/min   GFR calc Af Amer 55 (L) >60 mL/min   Anion gap 8 5 - 15    Comment: Performed at [facility], [address] [place], [place] [address]  CBC with Differential/Platelet     Status: Abnormal   Collection Time: 02/02/20  1:03 PM  Result Value Ref Range   WBC 3.8 (L) 4.0 - 10.5 K/uL   RBC 3.55 (L) 3.87 - 5.11 MIL/uL   Hemoglobin 11.5 (L) 12.0 - 15.0 g/dL   HCT  33.4 (L) 36 - 46 %   MCV 94.1 80.0 - 100.0 fL   MCH 32.4 26.0 - 34.0 pg   MCHC 34.4 30.0 - 36.0 g/dL   RDW 13.2 11.5 - 15.5 %   Platelets 167 150 - 400 K/uL   nRBC 0.0 0.0 - 0.2 %   Neutrophils Relative % 37 %   Neutro Abs 1.4 (L) 1.7 - 7.7 K/uL   Lymphocytes Relative 35 %   Lymphs Abs 1.3 0.7 - 4.0 K/uL   Monocytes Relative 24 %   Monocytes Absolute 0.9 0 - 1 K/uL   Eosinophils Relative 1 %   Eosinophils Absolute 0.1 0 - 0 K/uL   Basophils Relative 3 %  Basophils Absolute 0.1 0 - 0 K/uL   Immature Granulocytes 0 %   Abs Immature Granulocytes 0.01 0.00 - 0.07 K/uL    Comment: Performed at [facility], [address] [place], [place] [address]  Protein electrophoresis, serum     Status: Abnormal   Collection Time: 02/02/20  1:03 PM  Result Value Ref Range   Total Protein ELP 5.8 (L) 6.0 - 8.5 g/dL   Albumin ELP 3.6 2.9 - 4.4 g/dL   Alpha-1-Globulin 0.2 0.0 - 0.4 g/dL   Alpha-2-Globulin 0.7 0.4 - 1.0 g/dL   Beta Globulin 0.8 0.7 - 1.3 g/dL   Gamma Globulin 0.6 0.4 - 1.8 g/dL   M-Spike, % 0.2 (H) Not Observed g/dL   SPE Interp. Comment     Comment: (NOTE) The SPE pattern demonstrates a single peak (M-spike) in the gamma region which may represent monoclonal protein. This peak may also be caused by circulating immune complexes, cryoglobulins, C-reactive protein, fibrinogen or hemolysis.  If clinically indicated, the presence of a monoclonal gammopathy may be confirmed by immuno- fixation, as well as an evaluation of the urine for the presence of Bence-Jones protein. Performed At: [facility] [place], [place] [id number] Rush Farmer MD ZR:[id number]    Comment Comment     Comment: (NOTE) Protein electrophoresis scan will follow via computer, mail, or courier delivery.    Globulin, Total 2.2 2.2 - 3.9 g/dL   A/G Ratio 1.6 0.7 - 1.7  Kappa/lambda light chains     Status: Abnormal   Collection Time: 02/02/20  1:03 PM  Result Value Ref  Range   Kappa free light chain 19.7 (H) 3.3 - 19.4 mg/L   Lamda free light chains 2,302.7 (H) 5.7 - 26.3 mg/L   Kappa, lamda light chain ratio 0.01 (L) 0.26 - 1.65    Comment: (NOTE) Performed At: [facility] [place], [place] [id number] Rush Farmer MD LH:[id number]   IgG, IgA, IgM     Status: Abnormal   Collection Time: 02/02/20  1:03 PM  Result Value Ref Range   IgG (Immunoglobin G), Serum 487 (L) 586 - 1,602 mg/dL   IgA 25 (L) 64 - 422 mg/dL    Comment: Result confirmed on concentration.   IgM (Immunoglobulin M), Srm 13 (L) 26 - 217 mg/dL    Comment: (NOTE) Result confirmed on concentration. Performed At: [facility] [place], [place] [id number] Rush Farmer MD HR:[id number]   Basic metabolic panel     Status: Abnormal   Collection Time: 03/01/20  1:00 PM  Result Value Ref Range   Sodium 138 135 - 145 mmol/L   Potassium 3.7 3.5 - 5.1 mmol/L   Chloride 109 98 - 111 mmol/L   CO2 21 (L) 22 - 32 mmol/L   Glucose, Bld 114 (H) 70 - 99 mg/dL    Comment: Glucose reference range applies only to samples taken after fasting for at least 8 hours.   BUN 20 8 - 23 mg/dL   Creatinine, Ser 1.39 (H) 0.44 - 1.00 mg/dL   Calcium 8.4 (L) 8.9 - 10.3 mg/dL   GFR calc non Af Amer 35 (L) >60 mL/min   GFR calc Af Amer 40 (L) >60 mL/min   Anion gap 8 5 - 15    Comment: Performed at [facility], [address] [place], [place] [address]  CBC with Differential/Platelet     Status: Abnormal   Collection Time: 03/01/20  1:00 PM  Result Value Ref Range   WBC 3.6 (L) 4.0 - 10.5 K/uL   RBC 3.38 (L) 3.87 - 5.11 MIL/uL   Hemoglobin 11.0 (L) 12.0 - 15.0 g/dL   HCT 31.5 (L) 36 - 46 %   MCV 93.2 80.0 - 100.0 fL   MCH 32.5 26.0 - 34.0 pg   MCHC 34.9 30.0 - 36.0 g/dL   RDW 13.8 11.5 - 15.5 %   Platelets 177 150 - 400 K/uL   nRBC 0.0 0.0 - 0.2 %   Neutrophils Relative % 40 %   Neutro Abs 1.5 (L) 1.7 - 7.7 K/uL   Lymphocytes Relative 32 %    Lymphs Abs 1.2 0.7 - 4.0 K/uL   Monocytes Relative 25 %   Monocytes Absolute 0.9 0 - 1 K/uL   Eosinophils Relative 0 %   Eosinophils Absolute 0.0 0 - 0 K/uL   Basophils Relative 3 %   Basophils Absolute 0.1 0 - 0 K/uL   Immature Granulocytes 0 %   Abs Immature Granulocytes 0.01 0.00 - 0.07 K/uL    Comment: Performed at [facility], [address] [place], [place] [address]  Protein electrophoresis, serum     Status: Abnormal   Collection Time: 03/01/20  1:00 PM  Result Value Ref Range   Total Protein ELP 5.6 (L) 6.0 - 8.5 g/dL   Albumin ELP 3.5 2.9 - 4.4 g/dL   Alpha-1-Globulin 0.2 0.0 - 0.4 g/dL   Alpha-2-Globulin 0.6 0.4 - 1.0 g/dL   Beta Globulin 0.8 0.7 - 1.3 g/dL   Gamma Globulin 0.5 0.4 - 1.8 g/dL   M-Spike, % 0.2 (H) Not Observed g/dL   SPE Interp. Comment     Comment: (NOTE) The SPE pattern demonstrates a single peak (M-spike) in the gamma region which may represent monoclonal protein. This peak may also be caused by circulating immune complexes, cryoglobulins, C-reactive protein, fibrinogen or hemolysis.  If clinically indicated, the presence of a monoclonal gammopathy may be confirmed by immuno- fixation, as well as an evaluation of the urine for the presence of Bence-Jones protein. Performed At: [facility] [place], [place] [id number] Rush Farmer MD WF:[id number]    Comment Comment     Comment: (NOTE) Protein electrophoresis scan will follow via computer, mail, or courier delivery.    Globulin, Total 2.1 (L) 2.2 - 3.9 g/dL   A/G Ratio 1.7 0.7 - 1.7  Kappa/lambda light chains     Status: Abnormal   Collection Time: 03/01/20  1:00 PM  Result Value Ref Range   Kappa free light chain 18.8 3.3 - 19.4 mg/L   Lamda free light chains 2,309.7 (H) 5.7 - 26.3 mg/L   Kappa, lamda light chain ratio 0.01 (L) 0.26 - 1.65    Comment: (NOTE) Performed At: [facility] [place], [place] [id number] Rush Farmer MD  [id number]   IgG, IgA, IgM     Status: Abnormal   Collection Time: 03/01/20  1:00 PM  Result Value Ref Range   IgG (Immunoglobin G), Serum 468 (L) 586 - 1,602 mg/dL   IgA 25 (L) 64 - 422 mg/dL    Comment: Result confirmed on concentration.   IgM (Immunoglobulin M), Srm 12 (L) 26 - 217 mg/dL    Comment: (NOTE) Result confirmed on concentration. Performed At: [facility] [place], [place] [id number] Rush Farmer MD YI:[id number]   UA/M w/rflx Culture, Routine     Status: Abnormal   Collection Time: 03/10/20 11:56  AM   Specimen: Urine   Urine  Result Value Ref Range   Specific Gravity, UA      >=1.030 (A) 1.005 - 1.030   pH, UA 5.5 5.0 - 7.5   Color, UA Yellow Yellow   Appearance Ur Turbid (A) Clear   Leukocytes,UA Negative Negative   Protein,UA 3+ (A) Negative/Trace   Glucose, UA 1+ (A) Negative   Ketones, UA Trace (A) Negative   RBC, UA 1+ (A) Negative   Bilirubin, UA Negative Negative   Urobilinogen, Ur 0.2 0.2 - 1.0 mg/dL   Nitrite, UA Negative Negative   Microscopic Examination See below:     Comment: Microscopic was indicated and was performed.   Urinalysis Reflex Comment     Comment: This specimen will not reflex to a Urine Culture.  Microscopic Examination     Status: None   Collection Time: 03/10/20 11:56 AM   Urine  Result Value Ref Range   WBC, UA 0-5 0 - 5 /hpf   RBC None seen 0 - 2 /hpf   Epithelial Cells (non renal) 0-10 0 - 10 /hpf   Casts None seen None seen /lpf   Bacteria, UA None seen None seen/Few    MMSE - Mini Mental State Exam 03/10/2020 03/10/2019 02/25/2018  Orientation to time _0 Orientation to Place _1 Registration _2 Attention/ Calculation _3 Recall _4 Language- name 2 objects _5 Language- repeat _6 Language- follow 3 step command _7 Language- read & follow direction _8 Write a sentence _9 Copy design _10 Total score _11 Depression screen [facility] 2/9  03/10/2020 06/29/2019 05/04/2019 03/10/2019 09/04/2018  Decreased Interest 0 0 0 0 0  Down, Depressed, Hopeless 0 0 0 0 0  PHQ - 2 Score 0 0 0 0 0   Fall Risk  03/10/2020 06/29/2019 05/04/2019 03/10/2019 09/04/2018  Falls in the past year? 0 0 0 0 0  Number falls in past yr: - - 0 0 -  Injury with Fall? - - 0 0 -    Assessment/Plan: 1. Encounter for general adult medical examination with abnormal findings Well appearing 85 year old female--mammogram scheduled--up to date on PHM Routine labs are assessed monthly by oncologist, stable at this time  2. Hypothyroidism, unspecified type Levels ordered to review for stability, advised to ask oncology center to add lab order on to those she already has done monthly, paper script also provided - TSH + free T4  3. Multiple myeloma in relapse [facility]) Continue to be followed by Dr. Grayland Ormond, no complications from therapy at this time.  4. Essential hypertension BP and HR stable on current therapy, continue with routine monitoring.  5. Dysuria - UA/M w/rflx Culture, Routine - Microscopic Examination  General Counseling: Rhonda [name] verbalizes understanding of the findings of todays visit and agrees with plan of treatment. I have discussed any further diagnostic evaluation that may be needed or ordered today. We also reviewed her medications today. she has been encouraged to call the office with any questions or concerns that should arise related to todays visit.    Counseling:    Orders Placed This Encounter  Procedures  . Microscopic Examination  . UA/M w/rflx Culture, Routine  . TSH + free T4      Total time spent:  30 Minutes  Time spent includes review of chart, medications, test results, and follow up plan with the patient.   This patient was seen by Casey Burkitt AGNP-C Collaboration with Dr Lavera Guise as a part of collaborative care agreement   Tanna Furry. [facility]

## 2020-03-11 ENCOUNTER — Encounter: Payer: Self-pay | Admitting: Hospice and Palliative Medicine

## 2020-03-11 ENCOUNTER — Telehealth: Payer: Self-pay | Admitting: *Deleted

## 2020-03-11 DIAGNOSIS — J301 Allergic rhinitis due to pollen: Secondary | ICD-10-CM | POA: Diagnosis not present

## 2020-03-11 LAB — UA/M W/RFLX CULTURE, ROUTINE
Bilirubin, UA: NEGATIVE
Leukocytes,UA: NEGATIVE
Nitrite, UA: NEGATIVE
Specific Gravity, UA: >=1.03 — AB (ref 1.005–1.030)
Urobilinogen, Ur: 0.2 mg/dL (ref 0.2–1.0)
pH, UA: 5.5 (ref 5.0–7.5)

## 2020-03-11 LAB — MICROSCOPIC EXAMINATION
Bacteria, UA: NONE SEEN
Casts: NONE SEEN /lpf
RBC, Urine: NONE SEEN /hpf (ref 0–2)

## 2020-03-11 NOTE — Telephone Encounter (Signed)
Patient is scheduled for a root canal on Monday. Her dentist wants clearance from Dr. Grayland Ormond. Patient said to call her back with the answer.

## 2020-03-11 NOTE — Telephone Encounter (Signed)
Yes, that is fine.  Proceed with procedure.

## 2020-03-11 NOTE — Telephone Encounter (Signed)
Patient called and notified of provider's response. She denied further questions and verbalized understanding.

## 2020-03-18 DIAGNOSIS — J301 Allergic rhinitis due to pollen: Secondary | ICD-10-CM | POA: Diagnosis not present

## 2020-03-25 NOTE — Progress Notes (Signed)
[place]  [phone] Fax:(336) [phone]  ID: [name] OB: 1934-03-15  MR#: [id number]  PHX#:[id number]  Patient Care Team: Lavera Guise, MD as PCP - General (Internal Medicine) Lloyd Huger, MD as Consulting Physician (Oncology)   CHIEF COMPLAINT:  Multiple myeloma in relapse.  INTERVAL HISTORY: Patient returns to clinic today for further evaluation and continuation of Zometa and pomalidomide.  She continues to have chronic weakness and fatigue, but otherwise feels well.  She has a mild peripheral neuropathy.  She has no other neurologic complaints. She denies any recent fevers or illnesses. She has a good appetite and denies weight loss.  She denies any chest pain, shortness of breath, cough, or hemoptysis.  She denies any nausea, vomiting, constipation, or diarrhea.  She has no melena or hematochezia.  She has no urinary complaints.  Patient offers no further specific complaints today.  REVIEW OF SYSTEMS:   Review of Systems  Constitutional: Positive for malaise/fatigue. Negative for fever and weight loss.  HENT: Negative for congestion and sore throat.   Respiratory: Negative.  Negative for cough and shortness of breath.   Cardiovascular: Negative.  Negative for chest pain, palpitations and leg swelling.  Gastrointestinal: Negative.  Negative for abdominal pain, blood in stool, constipation, diarrhea, melena, nausea and vomiting.  Genitourinary: Negative.  Negative for dysuria.  Musculoskeletal: Negative.  Negative for back pain.  Skin: Negative.  Negative for rash.  Neurological: Positive for tingling, sensory change and weakness. Negative for focal weakness.  Psychiatric/Behavioral: Negative.  The patient is not nervous/anxious and does not have insomnia.     As per HPI. Otherwise, a complete review of systems is negative.  PAST MEDICAL HISTORY: Past Medical History:  Diagnosis Date  . Actinic keratosis   . Arthritis    hands   . Benign neoplasm of ascending colon   . Cataract   . GERD (gastroesophageal reflux disease)   . Hyperlipemia   . Hypertension 01/31/2015  . Hypothyroidism   . Melanoma ([place]) 04/28/2018   R mid to distal ant lat thigh. MM, SS, tumor thickness 0.8m, antatomic level II. Excised: 05/13/18, margins free  . [place]  . [place] 04/18[place]  . Osteoporosis   . Shingles   . Skin cancer    basal cell  . Squamous cell carcinoma of skin 02/04/2014   Left pretibial. KA-like pattern  . Squamous cell carcinoma of skin 09/15/2015   Right lat. superior ankle are. Superficial infiltration. Tx: EDC  . Squamous cell carcinoma of skin 10/15/2016   Left medial mid lower leg. Tx: EDC  . Squamous cell carcinoma of skin 11/27/2018   Right dorsum proximal forearm.  . Squamous cell carcinoma of skin 09/09/2019   Right cheek lat. to mid nasolabial area. Tx: EDC  . Squamous cell carcinoma of skin 12/09/2019   right pretibial below knee    PAST SURGICAL HISTORY: Past Surgical History:  Procedure Laterality Date  . BREAST EXCISIONAL BIOPSY Right 15+ yrs ago   EXCISIONAL - NEG  . CATARACT EXTRACTION W/ INTRAOCULAR LENS IMPLANT    . CHOLECYSTECTOMY N/A 03/14/2017   Procedure: LAPAROSCOPIC CHOLECYSTECTOMY;  Surgeon: CFlorene Glen MD;  Location: [facility] OR[facility];  Service: General;  Laterality: N/A;  . COLONOSCOPY WITH PROPOFOL N/A 02/21/2015   Procedure: COLONOSCOPY WITH PROPOFOL;  Surgeon: DLucilla Lame MD;  [facility]  Service: Endoscopy;  Laterality: N/A;  . [place] POL[place]  02/21/2015  Procedure: POLYPECTOMY;  Surgeon: Lucilla Lame, MD;  Location: [place];  Service: Endoscopy;;    FAMILY HISTORY: Family History  Problem Relation Age of Onset  . Heart disease Mother   . Stroke Father   . Heart disease Brother   . Kidney cancer Neg Hx   . Prostate cancer Neg Hx   . Bladder Cancer Neg Hx    . Breast cancer Neg Hx     ADVANCED DIRECTIVES (Y/N):  N  HEALTH MAINTENANCE: Social History   Tobacco Use  . Smoking status: Never Smoker  . Smokeless tobacco: Never Used  Vaping Use  . Vaping Use: Never used  Substance Use Topics  . Alcohol use: No    Alcohol/week: 0.0 standard drinks  . Drug use: No     Colonoscopy:  PAP:  Bone density:  Lipid panel:  Allergies  Allergen Reactions  . Revlimid [Lenalidomide]     Current Outpatient Medications  Medication Sig Dispense Refill  . aspirin EC 81 MG tablet Take 81 mg by mouth daily.    . Cyanocobalamin (B-12 PO) Take 500 mcg by mouth daily.     Marland Kitchen EPINEPHrine 0.3 mg/0.3 mL IJ SOAJ injection     . fexofenadine (ALLEGRA) 30 MG tablet Take 30 mg by mouth daily.     Marland Kitchen levothyroxine (SYNTHROID) 50 MCG tablet TAKE 1 TABLET BY MOUTH DAILY BEFORE BREAKFAST 90 tablet 3  . Multiple Vitamins-Minerals (CENTRUM SILVER PO) Take 1 tablet by mouth daily.    Marland Kitchen POMALYST 2 MG capsule TAKE 1 CAPSULE BY MOUTH EVERY DAY FOR 21 DAYS, FOLLOWED BY 7 DAYS OFF 21 capsule 0  . sulfamethoxazole-trimethoprim (BACTRIM) 400-80 MG tablet Take 1 tablet by mouth 2 (two) times daily. 20 tablet 0  . triamcinolone (NASACORT ALLERGY 24HR) 55 MCG/ACT AERO nasal inhaler Place 2 sprays into the nose 2 (two) times daily as needed.     Marland Kitchen UNABLE TO FIND Inject 1 Dose as directed once a week. Allergy injections once a week      No current facility-administered medications for this visit.    OBJECTIVE: Vitals:   03/29/20 1319  BP: (!) 121/52  Pulse: 74  Resp: 20  Temp: 98.5 F (36.9 C)  SpO2: 98%     Body mass index is 22.94 kg/m.    ECOG FS:0 - Asymptomatic  General: Well-developed, well-nourished, no acute distress. Eyes: Pink conjunctiva, anicteric sclera. HEENT: Normocephalic, moist mucous membranes. Lungs: No audible wheezing or coughing. Heart: Regular rate and rhythm. Abdomen: Soft, nontender, no obvious distention. Musculoskeletal: No edema,  cyanosis, or clubbing. Neuro: Alert, answering all questions appropriately. Cranial nerves grossly intact. Skin: No rashes or petechiae noted. Psych: Normal affect.   LAB RESULTS:  Lab Results  Component Value Date   NA 136 03/29/2020   K 3.7 03/29/2020   CL 109 03/29/2020   CO2 20 (L) 03/29/2020   GLUCOSE 136 (H) 03/29/2020   BUN 22 03/29/2020   CREATININE 1.14 (H) 03/29/2020   CALCIUM 8.1 (L) 03/29/2020   PROT 6.5 03/04/2017   ALBUMIN 4.2 03/04/2017   AST 24 03/04/2017   ALT 16 03/04/2017   ALKPHOS 49 03/04/2017   BILITOT 0.5 03/04/2017   GFRNONAA 44 (L) 03/29/2020   GFRAA 40 (L) 03/01/2020    Lab Results  Component Value Date   WBC 3.8 (L) 03/29/2020   NEUTROABS 1.7 03/29/2020   HGB 11.3 (L) 03/29/2020   HCT 33.2 (L) 03/29/2020   MCV 95.7 03/29/2020   PLT 191 03/29/2020  Lab Results  Component Value Date   TOTALPROTELP 5.6 (L) 03/01/2020   ALBUMINELP 3.5 03/01/2020   A1GS 0.2 03/01/2020   A2GS 0.6 03/01/2020   BETS 0.8 03/01/2020   GAMS 0.5 03/01/2020   MSPIKE 0.2 (H) 03/01/2020   SPEI Comment 03/01/2020     STUDIES: No results found.  ASSESSMENT:  Multiple myeloma in relapse.  PLAN:    1.  Multiple myeloma in relapse: Bone marrow biopsy on April 09, 2016 revealed 44% plasma cells and normal cytogenetics. Given the results of her metastatic bone survey on March 12, 2016, her elevated lambda free chains, and hypercalcemia, patient fit the criteria for multiple myeloma and was initially treated with single agent Revlimid. Metastatic bone survey on February 12, 2019 revealed new small lesions in her calvarium.  Patient's M spike remains unchanged at 0.2.  IgG, IgM, and IgA are all mildly decreased and unchanged.  Lambda free light chains are slowly trending down to 2309, but this is unchanged from 1 month ago.   Patient could not tolerate Revlimid and was switched to pomalidomide 2 mg daily for 21 days with a 7-day break.  Proceed with cycle 8 of  pomalidomide today.  If there is no appreciable change in her lambda light chain from laboratory work today, will increase pomalidomide to 4 mg daily.  Continue with dose reduced Zometa today as well.  Return to clinic in 4 weeks for further evaluation, continuation of treatment, and possibly increasing dose of pomalidomide 2.  Chronic renal insufficiency: Creatinine has improved to 1.14.  Continue with dose reduced and made as above. 3.  Anemia: Chronic and unchanged.  Patient's hemoglobin is 11.3. 4.  Leukopenia: Chronic and unchanged. 5.  Thrombocytopenia: Resolved. 6.  Peripheral neuropathy: Patient was previously given a referral to acupuncture which she said did not help much.  7.  Constipation: Patient does not complain of this today.  Continue OTC remedies as needed. 8.  Hypercalcemia: Patient now has hypocalcemia.  Proceed with Zometa as above.   Patient expressed understanding and was in agreement with this plan. She also understands that She can call clinic at any time with any questions, concerns, or complaints.    Lloyd Huger, MD 03/29/20 6:58 PM

## 2020-03-29 ENCOUNTER — Inpatient Hospital Stay: Payer: Medicare PPO

## 2020-03-29 ENCOUNTER — Inpatient Hospital Stay: Payer: Medicare PPO | Attending: Oncology

## 2020-03-29 ENCOUNTER — Encounter: Payer: Self-pay | Admitting: Oncology

## 2020-03-29 ENCOUNTER — Inpatient Hospital Stay (HOSPITAL_BASED_OUTPATIENT_CLINIC_OR_DEPARTMENT_OTHER): Payer: Medicare PPO | Admitting: Oncology

## 2020-03-29 ENCOUNTER — Other Ambulatory Visit: Payer: Self-pay

## 2020-03-29 VITALS — BP 121/52 | HR 74 | Temp 98.5°F | Resp 20 | Wt 125.4 lb

## 2020-03-29 DIAGNOSIS — G629 Polyneuropathy, unspecified: Secondary | ICD-10-CM | POA: Insufficient documentation

## 2020-03-29 DIAGNOSIS — R5383 Other fatigue: Secondary | ICD-10-CM | POA: Diagnosis not present

## 2020-03-29 DIAGNOSIS — I129 Hypertensive chronic kidney disease with stage 1 through stage 4 chronic kidney disease, or unspecified chronic kidney disease: Secondary | ICD-10-CM | POA: Diagnosis not present

## 2020-03-29 DIAGNOSIS — C9000 Multiple myeloma not having achieved remission: Secondary | ICD-10-CM

## 2020-03-29 DIAGNOSIS — R531 Weakness: Secondary | ICD-10-CM | POA: Insufficient documentation

## 2020-03-29 DIAGNOSIS — Z85820 Personal history of malignant melanoma of skin: Secondary | ICD-10-CM | POA: Insufficient documentation

## 2020-03-29 DIAGNOSIS — D649 Anemia, unspecified: Secondary | ICD-10-CM | POA: Insufficient documentation

## 2020-03-29 DIAGNOSIS — C9002 Multiple myeloma in relapse: Secondary | ICD-10-CM | POA: Insufficient documentation

## 2020-03-29 DIAGNOSIS — D72819 Decreased white blood cell count, unspecified: Secondary | ICD-10-CM | POA: Insufficient documentation

## 2020-03-29 DIAGNOSIS — C9001 Multiple myeloma in remission: Secondary | ICD-10-CM

## 2020-03-29 DIAGNOSIS — K5900 Constipation, unspecified: Secondary | ICD-10-CM | POA: Diagnosis not present

## 2020-03-29 DIAGNOSIS — N289 Disorder of kidney and ureter, unspecified: Secondary | ICD-10-CM | POA: Diagnosis not present

## 2020-03-29 LAB — CBC WITH DIFFERENTIAL/PLATELET
Abs Immature Granulocytes: 0.01 10*3/uL (ref 0.00–0.07)
Basophils Absolute: 0.1 10*3/uL (ref 0.0–0.1)
Basophils Relative: 4 %
Eosinophils Absolute: 0 10*3/uL (ref 0.0–0.5)
Eosinophils Relative: 1 %
HCT: 33.2 % — ABNORMAL LOW (ref 36.0–46.0)
Hemoglobin: 11.3 g/dL — ABNORMAL LOW (ref 12.0–15.0)
Immature Granulocytes: 0 %
Lymphocytes Relative: 29 %
Lymphs Abs: 1.1 10*3/uL (ref 0.7–4.0)
MCH: 32.6 pg (ref 26.0–34.0)
MCHC: 34 g/dL (ref 30.0–36.0)
MCV: 95.7 fL (ref 80.0–100.0)
Monocytes Absolute: 0.8 10*3/uL (ref 0.1–1.0)
Monocytes Relative: 21 %
Neutro Abs: 1.7 10*3/uL (ref 1.7–7.7)
Neutrophils Relative %: 45 %
Platelets: 191 10*3/uL (ref 150–400)
RBC: 3.47 MIL/uL — ABNORMAL LOW (ref 3.87–5.11)
RDW: 14.4 % (ref 11.5–15.5)
WBC: 3.8 10*3/uL — ABNORMAL LOW (ref 4.0–10.5)
nRBC: 0 % (ref 0.0–0.2)

## 2020-03-29 LAB — BASIC METABOLIC PANEL
Anion gap: 7 (ref 5–15)
BUN: 22 mg/dL (ref 8–23)
CO2: 20 mmol/L — ABNORMAL LOW (ref 22–32)
Calcium: 8.1 mg/dL — ABNORMAL LOW (ref 8.9–10.3)
Chloride: 109 mmol/L (ref 98–111)
Creatinine, Ser: 1.14 mg/dL — ABNORMAL HIGH (ref 0.44–1.00)
GFR, Estimated: 44 mL/min — ABNORMAL LOW (ref >60)
Glucose, Bld: 136 mg/dL — ABNORMAL HIGH (ref 70–99)
Potassium: 3.7 mmol/L (ref 3.5–5.1)
Sodium: 136 mmol/L (ref 135–145)

## 2020-03-29 MED ORDER — ZOLEDRONIC ACID 4 MG/5ML IV CONC
3.0000 mg | Freq: Once | INTRAVENOUS | Status: AC
  Administered 2020-03-29: 3 mg via INTRAVENOUS
  Filled 2020-03-29: qty 3.75

## 2020-03-29 MED ORDER — SODIUM CHLORIDE 0.9 % IV SOLN
Freq: Once | INTRAVENOUS | Status: AC
  Filled 2020-03-29: qty 250

## 2020-03-30 LAB — KAPPA/LAMBDA LIGHT CHAINS
Kappa free light chain: 20.6 mg/L — ABNORMAL HIGH (ref 3.3–19.4)
Kappa, lambda light chain ratio: 0.01 — ABNORMAL LOW (ref 0.26–1.65)
Lambda free light chains: 1906.6 mg/L — ABNORMAL HIGH (ref 5.7–26.3)

## 2020-03-30 LAB — IGG, IGA, IGM
IgA: 28 mg/dL — ABNORMAL LOW (ref 64–422)
IgG (Immunoglobin G), Serum: 498 mg/dL — ABNORMAL LOW (ref 586–1602)
IgM (Immunoglobulin M), Srm: 14 mg/dL — ABNORMAL LOW (ref 26–217)

## 2020-03-31 LAB — PROTEIN ELECTROPHORESIS, SERUM
A/G Ratio: 1.9 — ABNORMAL HIGH (ref 0.7–1.7)
Albumin ELP: 3.7 g/dL (ref 2.9–4.4)
Alpha-1-Globulin: 0.2 g/dL (ref 0.0–0.4)
Alpha-2-Globulin: 0.6 g/dL (ref 0.4–1.0)
Beta Globulin: 0.7 g/dL (ref 0.7–1.3)
Gamma Globulin: 0.5 g/dL (ref 0.4–1.8)
Globulin, Total: 2 g/dL — ABNORMAL LOW (ref 2.2–3.9)
M-Spike, %: 0.1 g/dL — ABNORMAL HIGH
Total Protein ELP: 5.7 g/dL — ABNORMAL LOW (ref 6.0–8.5)

## 2020-04-01 DIAGNOSIS — J301 Allergic rhinitis due to pollen: Secondary | ICD-10-CM | POA: Diagnosis not present

## 2020-04-05 ENCOUNTER — Other Ambulatory Visit: Payer: Self-pay | Admitting: Oncology

## 2020-04-05 ENCOUNTER — Other Ambulatory Visit: Payer: Self-pay

## 2020-04-05 DIAGNOSIS — Z0001 Encounter for general adult medical examination with abnormal findings: Secondary | ICD-10-CM

## 2020-04-05 DIAGNOSIS — C9001 Multiple myeloma in remission: Secondary | ICD-10-CM

## 2020-04-05 DIAGNOSIS — Z1239 Encounter for other screening for malignant neoplasm of breast: Secondary | ICD-10-CM

## 2020-04-05 DIAGNOSIS — I10 Essential (primary) hypertension: Secondary | ICD-10-CM

## 2020-04-05 DIAGNOSIS — E039 Hypothyroidism, unspecified: Secondary | ICD-10-CM

## 2020-04-05 DIAGNOSIS — R300 Dysuria: Secondary | ICD-10-CM

## 2020-04-05 DIAGNOSIS — K219 Gastro-esophageal reflux disease without esophagitis: Secondary | ICD-10-CM

## 2020-04-05 MED ORDER — LEVOTHYROXINE SODIUM 50 MCG PO TABS: ORAL_TABLET | ORAL | 3 refills | Status: DC

## 2020-04-07 ENCOUNTER — Other Ambulatory Visit: Payer: Self-pay | Admitting: Oncology

## 2020-04-07 DIAGNOSIS — C9000 Multiple myeloma not having achieved remission: Secondary | ICD-10-CM

## 2020-04-08 DIAGNOSIS — J301 Allergic rhinitis due to pollen: Secondary | ICD-10-CM | POA: Diagnosis not present

## 2020-04-13 ENCOUNTER — Telehealth: Payer: Self-pay | Admitting: *Deleted

## 2020-04-13 NOTE — Telephone Encounter (Signed)
[facility] called asking for return call for clarification of patient Pomalyst prescription sent in [phone]

## 2020-04-13 NOTE — Telephone Encounter (Signed)
Per Dr. Gary Fleet last note, this was plan of care on 10/19 " Patient could not tolerate Revlimid and was switched to pomalidomide 2 mg daily for 21 days with a 7-day break.  Proceed with cycle 8 of pomalidomide today.  If there is no appreciable change in her lambda light chain from laboratory work today, will increase pomalidomide to 4 mg daily. "   Kappa free light chain 3.3 - 19.4 mg/L 20.6High   Lamda free light chains 5.7 - 26.3 mg/L 1,906.6High   Kappa, lamda light chain ratio 0.26 - 1.65 0.01Low   Dr. Grayland Ormond please review last labs and let us know the plan of care. I will call the patient and pharmacy back to let them know we will inform plan of care tomorrow.

## 2020-04-13 NOTE — Telephone Encounter (Signed)
Spoke with Dr. Grayland Ormond the week following Ms. Rhonda [name]'s last office visit and the plan was to keep her on Pomalyst  2mg  with the decrease in her most recent lamda light chain results.   I called [employer] and they said they patient mentioned maybe going up on the dose so they were calling to clarify.   I let the patient and [employer] know about the plan to keep her on 2mg  dosing. [employer] will proceed with filling the prescription. We should be all good now!

## 2020-04-15 DIAGNOSIS — J301 Allergic rhinitis due to pollen: Secondary | ICD-10-CM | POA: Diagnosis not present

## 2020-04-22 DIAGNOSIS — J301 Allergic rhinitis due to pollen: Secondary | ICD-10-CM | POA: Diagnosis not present

## 2020-04-23 NOTE — Progress Notes (Signed)
[place]  Telephone:(336) [phone] Fax:(336) [phone]  ID: Rhonda [name] OB: [birth year]  MR#: [id number]  QZR#:[id number]  Patient Care Team: Lavera Guise, MD as PCP - General (Internal Medicine) Lloyd Huger, MD as Consulting Physician (Oncology)   CHIEF COMPLAINT:  Multiple myeloma in relapse.  INTERVAL HISTORY: Patient returns to clinic today for further evaluation and continuation of Zometa and pomalidomide.  She has chronic weakness and fatigue, but otherwise feels well and is tolerating her treatments. She has a mild peripheral neuropathy.  She has no other neurologic complaints. She denies any recent fevers or illnesses. She has a good appetite and denies weight loss.  She denies any chest [name], shortness of breath, cough, or hemoptysis.  She denies any nausea, vomiting, constipation, or diarrhea.  She has no melena or hematochezia.  She has no urinary complaints.  Patient offers no further specific complaints today.  REVIEW OF SYSTEMS:   Review of Systems  Constitutional: Positive for malaise/fatigue. Negative for fever and weight loss.  HENT: Negative for congestion and sore throat.   Respiratory: Negative.  Negative for cough and shortness of breath.   Cardiovascular: Negative.  Negative for chest [name], palpitations and leg swelling.  Gastrointestinal: Negative.  Negative for abdominal [name], blood in stool, constipation, diarrhea, melena, nausea and vomiting.  Genitourinary: Negative.  Negative for dysuria.  Musculoskeletal: Negative.  Negative for back [name].  Skin: Negative.  Negative for rash.  Neurological: Positive for tingling, sensory change and weakness. Negative for focal weakness.  Psychiatric/Behavioral: Negative.  The patient is not nervous/anxious and does not have insomnia.     As per HPI. Otherwise, a complete review of systems is negative.  PAST MEDICAL HISTORY: Past Medical History:  Diagnosis Date  . Actinic keratosis   .  Arthritis    hands  . Benign neoplasm of ascending colon   . Cataract   . GERD (gastroesophageal reflux disease)   . Hyperlipemia   . Hypertension 01/31/2015  . Hypothyroidism   . Melanoma ([place]) 04/28/2018   R mid to distal ant lat thigh. MM, SS, tumor thickness 0.54m, antatomic level II. Excised: 05/13/18, margins free  . [place] 04/18/2016  . [place] ([facility] 04/18/2016  . Osteoporosis   . Shingles   . Skin cancer    basal cell  . Squamous cell carcinoma of skin 02/04/2014   Left pretibial. KA-like pattern  . Squamous cell carcinoma of skin 09/15/2015   Right lat. superior ankle are. Superficial infiltration. Tx: EDC  . Squamous cell carcinoma of skin 10/15/2016   Left medial mid lower leg. Tx: EDC  . Squamous cell carcinoma of skin 11/27/2018   Right dorsum proximal forearm.  . Squamous cell carcinoma of skin 09/09/2019   Right cheek lat. to mid nasolabial area. Tx: EDC  . Squamous cell carcinoma of skin 12/09/2019   right pretibial below knee    PAST SURGICAL HISTORY: Past Surgical History:  Procedure Laterality Date  . BREAST EXCISIONAL BIOPSY Right 15+ yrs ago   EXCISIONAL - NEG  . CATARACT EXTRACTION W/ INTRAOCULAR LENS IMPLANT    . CHOLECYSTECTOMY N/A 03/14/2017   Procedure: LAPAROSCOPIC CHOLECYSTECTOMY;  Surgeon: CFlorene Glen MD;  Location: [facility];  Service: General;  Laterality: N/A;  . COLONOSCOPY WITH PROPOFOL N/A 02/21/2015   Procedure: COLONOSCOPY WITH PROPOFOL;  Surgeon: DLucilla Lame MD;  [facility] [facility]  Service: Endoscopy;  Laterality: N/A;  . [place]  02/21/2015   Procedure: POLYPECTOMY;  Surgeon: Lucilla Lame, MD;  Location: [place];  Service: Endoscopy;;    FAMILY HISTORY: Family History  Problem Relation Age of Onset  . Heart disease Mother   . Stroke Father   . Heart disease Brother   . Kidney cancer Neg Hx   . Prostate cancer Neg Hx   .  Bladder Cancer Neg Hx   . Breast cancer Neg Hx     ADVANCED DIRECTIVES (Y/N):  N  HEALTH MAINTENANCE: Social History   Tobacco Use  . Smoking status: Never Smoker  . Smokeless tobacco: Never Used  Vaping Use  . Vaping Use: Never used  Substance Use Topics  . Alcohol use: No    Alcohol/week: 0.0 standard drinks  . Drug use: No     Colonoscopy:  PAP:  Bone density:  Lipid panel:  Allergies  Allergen Reactions  . Revlimid [Lenalidomide]     Current Outpatient Medications  Medication Sig Dispense Refill  . aspirin EC 81 MG tablet Take 81 mg by mouth daily.    . Cyanocobalamin (B-12 PO) Take 500 mcg by mouth daily.     Marland Kitchen EPINEPHrine 0.3 mg/0.3 mL IJ SOAJ injection     . fexofenadine (ALLEGRA) 30 MG tablet Take 30 mg by mouth daily.     Marland Kitchen levothyroxine (SYNTHROID) 50 MCG tablet TAKE 1 TABLET BY MOUTH DAILY BEFORE BREAKFAST 90 tablet 3  . Multiple Vitamins-Minerals (CENTRUM SILVER PO) Take 1 tablet by mouth daily.    Marland Kitchen POMALYST 2 MG capsule TAKE 1 CAPSULE BY MOUTH EVERY DAY FOR 21 DAYS, FOLLOWED BY 7 DAYS OFF 21 capsule 0  . sulfamethoxazole-trimethoprim (BACTRIM) 400-80 MG tablet Take 1 tablet by mouth 2 (two) times daily. 20 tablet 0  . triamcinolone (NASACORT ALLERGY 24HR) 55 MCG/ACT AERO nasal inhaler Place 2 sprays into the nose 2 (two) times daily as needed.     Marland Kitchen UNABLE TO FIND Inject 1 Dose as directed once a week. Allergy injections once a week      No current facility-administered medications for this visit.    OBJECTIVE: Vitals:   04/25/20 1335  BP: (!) 113/57  Pulse: 76  Resp: 20  Temp: 97.6 F (36.4 C)  SpO2: 99%     Body mass index is 22.72 kg/m.    ECOG FS:0 - Asymptomatic  General: Well-developed, well-nourished, no acute distress. Eyes: Pink conjunctiva, anicteric sclera. HEENT: Normocephalic, moist mucous membranes. Lungs: No audible wheezing or coughing. Heart: Regular rate and rhythm. Abdomen: Soft, nontender, no obvious  distention. Musculoskeletal: No edema, cyanosis, or clubbing. Neuro: Alert, answering all questions appropriately. Cranial nerves grossly intact. Skin: No rashes or petechiae noted. Psych: Normal affect.   LAB RESULTS:  Lab Results  Component Value Date   NA 135 04/25/2020   K 3.9 04/25/2020   CL 107 04/25/2020   CO2 22 04/25/2020   GLUCOSE 91 04/25/2020   BUN 20 04/25/2020   CREATININE 1.08 (H) 04/25/2020   CALCIUM 8.6 (L) 04/25/2020   PROT 6.5 03/04/2017   ALBUMIN 4.2 03/04/2017   AST 24 03/04/2017   ALT 16 03/04/2017   ALKPHOS 49 03/04/2017   BILITOT 0.5 03/04/2017   GFRNONAA 51 (L) 04/25/2020   GFRAA 40 (L) 03/01/2020    Lab Results  Component Value Date   WBC 3.5 (L) 04/25/2020   NEUTROABS 1.3 (L) 04/25/2020   HGB 11.2 (L) 04/25/2020   HCT 33.4 (L) 04/25/2020   MCV 97.7 04/25/2020   PLT  178 04/25/2020   Lab Results  Component Value Date   TOTALPROTELP 6.0 04/25/2020   ALBUMINELP 3.8 04/25/2020   A1GS 0.2 04/25/2020   A2GS 0.7 04/25/2020   BETS 0.8 04/25/2020   GAMS 0.5 04/25/2020   MSPIKE 0.2 (H) 04/25/2020   SPEI Comment 04/25/2020     STUDIES: No results found.  ASSESSMENT:  Multiple myeloma in relapse.  PLAN:    1.  Multiple myeloma in relapse: Bone marrow biopsy on April 09, 2016 revealed 44% plasma cells and normal cytogenetics. Given the results of her metastatic bone survey on March 12, 2016, her elevated lambda free chains, and hypercalcemia, patient fit the criteria for multiple myeloma and was initially treated with single agent Revlimid. Metastatic bone survey on February 12, 2019 revealed new small lesions in her calvarium.  Patient's M spike remains unchanged at 0.2.  IgG, IgM, and IgA are all mildly decreased and unchanged.  Lambda light chains continue to trend down and are now 1646.8.  Patient could not tolerate Revlimid and was switched to pomalidomide 2 mg daily for 21 days with a 7-day break.  Proceed with cycle 9 of pomalidomide  today.  Continue with dose reduced Zometa today as well.  Return to clinic in 4 weeks for further evaluation and continuation of treatment. 2.  Chronic renal insufficiency: Essentially resolved. 3.  Anemia: Chronic and unchanged.  Patient's hemoglobin is 11.2. 4.  Leukopenia: Chronic and unchanged. 5.  Peripheral neuropathy: Patient was previously given a referral to acupuncture which she said did not help much.  6.  Constipation: Patient does not complain of this today.  Continue OTC remedies as needed. 7.  Hypercalcemia: Resolved.  Proceed with Zometa as above.   Patient expressed understanding and was in agreement with this plan. She also understands that She can call clinic at any time with any questions, concerns, or complaints.    Lloyd Huger, MD 04/28/20 6:55 AM

## 2020-04-25 ENCOUNTER — Inpatient Hospital Stay: Payer: Medicare PPO | Attending: Oncology | Admitting: Pharmacist

## 2020-04-25 ENCOUNTER — Encounter: Payer: Self-pay | Admitting: Oncology

## 2020-04-25 ENCOUNTER — Inpatient Hospital Stay (HOSPITAL_BASED_OUTPATIENT_CLINIC_OR_DEPARTMENT_OTHER): Payer: Medicare PPO | Admitting: Oncology

## 2020-04-25 ENCOUNTER — Inpatient Hospital Stay: Payer: Medicare PPO

## 2020-04-25 VITALS — BP 113/57 | HR 76 | Temp 97.6°F | Resp 20 | Wt 124.2 lb

## 2020-04-25 DIAGNOSIS — K5900 Constipation, unspecified: Secondary | ICD-10-CM | POA: Insufficient documentation

## 2020-04-25 DIAGNOSIS — N289 Disorder of kidney and ureter, unspecified: Secondary | ICD-10-CM | POA: Diagnosis not present

## 2020-04-25 DIAGNOSIS — C9000 Multiple myeloma not having achieved remission: Secondary | ICD-10-CM

## 2020-04-25 DIAGNOSIS — I129 Hypertensive chronic kidney disease with stage 1 through stage 4 chronic kidney disease, or unspecified chronic kidney disease: Secondary | ICD-10-CM | POA: Insufficient documentation

## 2020-04-25 DIAGNOSIS — R5383 Other fatigue: Secondary | ICD-10-CM | POA: Insufficient documentation

## 2020-04-25 DIAGNOSIS — R531 Weakness: Secondary | ICD-10-CM | POA: Diagnosis not present

## 2020-04-25 DIAGNOSIS — G629 Polyneuropathy, unspecified: Secondary | ICD-10-CM | POA: Diagnosis not present

## 2020-04-25 DIAGNOSIS — C9002 Multiple myeloma in relapse: Secondary | ICD-10-CM | POA: Insufficient documentation

## 2020-04-25 DIAGNOSIS — D72819 Decreased white blood cell count, unspecified: Secondary | ICD-10-CM | POA: Insufficient documentation

## 2020-04-25 DIAGNOSIS — Z85820 Personal history of malignant melanoma of skin: Secondary | ICD-10-CM | POA: Diagnosis not present

## 2020-04-25 DIAGNOSIS — D649 Anemia, unspecified: Secondary | ICD-10-CM | POA: Insufficient documentation

## 2020-04-25 DIAGNOSIS — C9001 Multiple myeloma in remission: Secondary | ICD-10-CM

## 2020-04-25 LAB — BASIC METABOLIC PANEL
Anion gap: 6 (ref 5–15)
BUN: 20 mg/dL (ref 8–23)
CO2: 22 mmol/L (ref 22–32)
Calcium: 8.6 mg/dL — ABNORMAL LOW (ref 8.9–10.3)
Chloride: 107 mmol/L (ref 98–111)
Creatinine, Ser: 1.08 mg/dL — ABNORMAL HIGH (ref 0.44–1.00)
GFR, Estimated: 51 mL/min — ABNORMAL LOW (ref >60)
Glucose, Bld: 91 mg/dL (ref 70–99)
Potassium: 3.9 mmol/L (ref 3.5–5.1)
Sodium: 135 mmol/L (ref 135–145)

## 2020-04-25 LAB — CBC WITH DIFFERENTIAL/PLATELET
Abs Immature Granulocytes: 0.01 10*3/uL (ref 0.00–0.07)
Basophils Absolute: 0.1 10*3/uL (ref 0.0–0.1)
Basophils Relative: 3 %
Eosinophils Absolute: 0 10*3/uL (ref 0.0–0.5)
Eosinophils Relative: 1 %
HCT: 33.4 % — ABNORMAL LOW (ref 36.0–46.0)
Hemoglobin: 11.2 g/dL — ABNORMAL LOW (ref 12.0–15.0)
Immature Granulocytes: 0 %
Lymphocytes Relative: 33 %
Lymphs Abs: 1.1 10*3/uL (ref 0.7–4.0)
MCH: 32.7 pg (ref 26.0–34.0)
MCHC: 33.5 g/dL (ref 30.0–36.0)
MCV: 97.7 fL (ref 80.0–100.0)
Monocytes Absolute: 0.9 10*3/uL (ref 0.1–1.0)
Monocytes Relative: 26 %
Neutro Abs: 1.3 10*3/uL — ABNORMAL LOW (ref 1.7–7.7)
Neutrophils Relative %: 37 %
Platelets: 178 10*3/uL (ref 150–400)
RBC: 3.42 MIL/uL — ABNORMAL LOW (ref 3.87–5.11)
RDW: 13.8 % (ref 11.5–15.5)
WBC: 3.5 10*3/uL — ABNORMAL LOW (ref 4.0–10.5)
nRBC: 0 % (ref 0.0–0.2)

## 2020-04-25 MED ORDER — ZOLEDRONIC ACID 4 MG/5ML IV CONC
3.0000 mg | Freq: Once | INTRAVENOUS | Status: AC
  Administered 2020-04-25: 3 mg via INTRAVENOUS
  Filled 2020-04-25: qty 3.75

## 2020-04-25 MED ORDER — SODIUM CHLORIDE 0.9 % IV SOLN
INTRAVENOUS | Status: DC
  Filled 2020-04-25: qty 250

## 2020-04-25 NOTE — Progress Notes (Signed)
[place]  [phone] [phone]  Patient Care Team: Lavera Guise, MD as PCP - General (Internal Medicine) Lloyd Huger, MD as Consulting Physician (Oncology)   Name of the patient: Rhonda [name]  [id number]  [birth year]   Date of visit: 04/25/20  HPI: Patient is a 86 y.o. female with multiple myeloma.  Reason for Consult: Oral chemotherapy follow-up for Pomalyst (pomalidomide) therapy.   PAST MEDICAL HISTORY: Past Medical History:  Diagnosis Date  . Actinic keratosis   . Arthritis    hands  . Benign neoplasm of ascending colon   . Cataract   . GERD (gastroesophageal reflux disease)   . Hyperlipemia   . Hypertension 01/31/2015  . Hypothyroidism   . Melanoma ([place]) 04/28/2018   R mid to distal ant lat thigh. MM, SS, tumor thickness 0.66m, antatomic level II. Excised: 05/13/18, margins free  . [place] 04/18[place]2016  . Multiple myeloma in remission ([facility] 04/18/2016  . Osteoporosis   . Shingles   . Skin cancer    basal cell  . Squamous cell carcinoma of skin 02/04/2014   Left pretibial. KA-like pattern  . Squamous cell carcinoma of skin 09/15/2015   Right lat. superior ankle are. Superficial infiltration. Tx: EDC  . Squamous cell carcinoma of skin 10/15/2016   Left medial mid lower leg. Tx: EDC  . Squamous cell carcinoma of skin 11/27/2018   Right dorsum proximal forearm.  . Squamous cell carcinoma of skin 09/09/2019   Right cheek lat. to mid nasolabial area. Tx: EDC  . Squamous cell carcinoma of skin 12/09/2019   right pretibial below knee    PAST SURGICAL HISTORY:  Past Surgical History:  Procedure Laterality Date  . BREAST EXCISIONAL BIOPSY Right 15+ yrs ago   EXCISIONAL - NEG  . CATARACT EXTRACTION W/ INTRAOCULAR LENS IMPLANT    . CHOLECYSTECTOMY N/A 03/14/2017   Procedure: LAPAROSCOPIC CHOLECYSTECTOMY;  Surgeon: CFlorene Glen MD;  Location: [facility];  Service:  General;  Laterality: N/A;  . COLONOSCOPY WITH PROPOFOL N/A 02/21/2015   Procedure: COLONOSCOPY WITH PROPOFOL;  Surgeon: DLucilla Lame MD;  [place]  Service: Endoscopy;  Laterality: N/A;  . [place] POLYPECTOMY  02/21/2015   Procedure: POLYPECTOMY;  Surgeon: DLucilla Lame MD;  [place]  Service: Endoscopy;;    HEMATOLOGY/ONCOLOGY HISTORY:  Oncology History   No history exists.    ALLERGIES:  is allergic to revlimid [lenalidomide].  MEDICATIONS:  Current Outpatient Medications  Medication Sig Dispense Refill  . aspirin EC 81 MG tablet Take 81 mg by mouth daily.    . Cyanocobalamin (B-12 PO) Take 500 mcg by mouth daily.     .Marland KitchenEPINEPHrine 0.3 mg/0.3 mL IJ SOAJ injection     . fexofenadine (ALLEGRA) 30 MG tablet Take 30 mg by mouth daily.     .Marland Kitchenlevothyroxine (SYNTHROID) 50 MCG tablet TAKE 1 TABLET BY MOUTH DAILY BEFORE BREAKFAST 90 tablet 3  . Multiple Vitamins-Minerals (CENTRUM SILVER PO) Take 1 tablet by mouth daily.    .Marland KitchenPOMALYST 2 MG capsule TAKE 1 CAPSULE BY MOUTH EVERY DAY FOR 21 DAYS, FOLLOWED BY 7 DAYS OFF 21 capsule 0  . sulfamethoxazole-trimethoprim (BACTRIM) 400-80 MG tablet Take 1 tablet by mouth 2 (two) times daily. 20 tablet 0  . triamcinolone (NASACORT ALLERGY 24HR) 55 MCG/ACT AERO nasal inhaler Place 2 sprays into the nose 2 (two) times daily as needed.     .Marland Kitchen  UNABLE TO FIND Inject 1 Dose as directed once a week. Allergy injections once a week      No current facility-administered medications for this visit.    VITAL SIGNS: There were no vitals taken for this visit. There were no vitals filed for this visit.  Estimated body mass index is 22.72 kg/m as calculated from the following:   Height as of 03/10/20: '5\' 2"'  (1.575 m).   Weight as of an earlier encounter on 04/25/20: 56.3 kg (124 lb 3.2 oz).  LABS: CBC:    Component Value Date/Time   WBC 3.5 (L) 04/25/2020 1308   HGB 11.2 (L) 04/25/2020 1308    HCT 33.4 (L) 04/25/2020 1308   PLT 178 04/25/2020 1308   MCV 97.7 04/25/2020 1308   NEUTROABS 1.3 (L) 04/25/2020 1308   LYMPHSABS 1.1 04/25/2020 1308   MONOABS 0.9 04/25/2020 1308   EOSABS 0.0 04/25/2020 1308   BASOSABS 0.1 04/25/2020 1308   Comprehensive Metabolic Panel:    Component Value Date/Time   NA 135 04/25/2020 1308   K 3.9 04/25/2020 1308   CL 107 04/25/2020 1308   CO2 22 04/25/2020 1308   BUN 20 04/25/2020 1308   CREATININE 1.08 (H) 04/25/2020 1308   GLUCOSE 91 04/25/2020 1308   CALCIUM 8.6 (L) 04/25/2020 1308   AST 24 03/04/2017 1327   ALT 16 03/04/2017 1327   ALKPHOS 49 03/04/2017 1327   BILITOT 0.5 03/04/2017 1327   PROT 6.5 03/04/2017 1327   ALBUMIN 4.2 03/04/2017 1327    RADIOGRAPHIC STUDIES: No results found.   Assessment and Plan-  Continue Pomalyst 55m. Follow-up myeloma panels from today's visit.   Oral Chemotherapy Side Effect/Intolerance: Pt reported some constipation and diarrhea. When patient has constipation, she uses Milk of Magnesia. Discussed using milder constipation treatment such as Miralax instead of Milk of Magnesia, to help prevent over-correction of constipation. When she has diarrhea, pt utilizes Imodium. Pt reports mild fatigue which she is able to push through to do her normal daily activities. Pt reported occasional nausea. Discussed moving Pomalyst to bedtime dosing.  Oral Chemotherapy Adherence: Reports no missed doses  Medication Access Issues: [facility]  0 patient barriers to medication adherence identified.   Patient expressed understanding and was in agreement with this plan. She also understands that She can call clinic at any time with any questions, concerns, or complaints.   Thank you for allowing me to participate in the care of this very pleasant patient.   Time Total: 10 minutes  Visit consisted of counseling and education on dealing with issues of  symptom management in the setting of serious and potentially life-threatening illness.Greater than 50%  of this time was spent counseling and coordinating care related to the above assessment and plan.  Signed by: ADarl Pikes PharmD, BCPS, BSalley Slaughter CPP Hematology/Oncology Clinical Pharmacist Practitioner [phone] 04/25/2020 2:08 PM

## 2020-04-26 ENCOUNTER — Other Ambulatory Visit: Payer: Medicare PPO

## 2020-04-26 ENCOUNTER — Ambulatory Visit: Payer: Medicare PPO | Admitting: Oncology

## 2020-04-26 ENCOUNTER — Ambulatory Visit: Payer: Medicare PPO

## 2020-04-26 LAB — PROTEIN ELECTROPHORESIS, SERUM
A/G Ratio: 1.7 (ref 0.7–1.7)
Albumin ELP: 3.8 g/dL (ref 2.9–4.4)
Alpha-1-Globulin: 0.2 g/dL (ref 0.0–0.4)
Alpha-2-Globulin: 0.7 g/dL (ref 0.4–1.0)
Beta Globulin: 0.8 g/dL (ref 0.7–1.3)
Gamma Globulin: 0.5 g/dL (ref 0.4–1.8)
Globulin, Total: 2.2 g/dL (ref 2.2–3.9)
M-Spike, %: 0.2 g/dL — ABNORMAL HIGH
Total Protein ELP: 6 g/dL (ref 6.0–8.5)

## 2020-04-26 LAB — IGG, IGA, IGM
IgA: 29 mg/dL — ABNORMAL LOW (ref 64–422)
IgG (Immunoglobin G), Serum: 473 mg/dL — ABNORMAL LOW (ref 586–1602)
IgM (Immunoglobulin M), Srm: 11 mg/dL — ABNORMAL LOW (ref 26–217)

## 2020-04-26 LAB — KAPPA/LAMBDA LIGHT CHAINS
Kappa free light chain: 19.1 mg/L (ref 3.3–19.4)
Kappa, lambda light chain ratio: 0.01 — ABNORMAL LOW (ref 0.26–1.65)
Lambda free light chains: 1646.8 mg/L — ABNORMAL HIGH (ref 5.7–26.3)

## 2020-04-29 DIAGNOSIS — J301 Allergic rhinitis due to pollen: Secondary | ICD-10-CM | POA: Diagnosis not present

## 2020-05-03 DIAGNOSIS — H40153 Residual stage of open-angle glaucoma, bilateral: Secondary | ICD-10-CM | POA: Diagnosis not present

## 2020-05-05 ENCOUNTER — Other Ambulatory Visit: Payer: Self-pay | Admitting: Oncology

## 2020-05-05 DIAGNOSIS — C9000 Multiple myeloma not having achieved remission: Secondary | ICD-10-CM

## 2020-05-09 ENCOUNTER — Other Ambulatory Visit: Payer: Self-pay | Admitting: *Deleted

## 2020-05-09 DIAGNOSIS — C9000 Multiple myeloma not having achieved remission: Secondary | ICD-10-CM

## 2020-05-09 MED ORDER — POMALIDOMIDE 2 MG PO CAPS: ORAL_CAPSULE | ORAL | 0 refills | Status: DC

## 2020-05-13 DIAGNOSIS — J301 Allergic rhinitis due to pollen: Secondary | ICD-10-CM | POA: Diagnosis not present

## 2020-05-17 ENCOUNTER — Other Ambulatory Visit: Payer: Self-pay | Admitting: Internal Medicine

## 2020-05-17 DIAGNOSIS — Z1231 Encounter for screening mammogram for malignant neoplasm of breast: Secondary | ICD-10-CM

## 2020-05-20 DIAGNOSIS — J301 Allergic rhinitis due to pollen: Secondary | ICD-10-CM | POA: Diagnosis not present

## 2020-05-21 NOTE — Progress Notes (Signed)
[place]  [phone] [phone]  ID: Rhonda [name] OB: 10/04/1933  MR#: [id number]  GEZ#:[id number]  Patient Care Team: Lavera Guise, MD as PCP - General (Internal Medicine) Lloyd Huger, MD as Consulting Physician (Oncology)   CHIEF COMPLAINT:  Multiple myeloma in relapse.  INTERVAL HISTORY: Patient returns to clinic today for further evaluation and continuation of Zometa and pomalidomide.  Rhonda [name] continues to have chronic weakness and fatigue, but otherwise feels well and is tolerating her treatments without significant side effects.  Rhonda [name] has a mild peripheral neuropathy.  Rhonda [name] has no other neurologic complaints. Rhonda [name] denies any recent fevers or illnesses. Rhonda [name] has a good appetite and denies weight loss.  Rhonda [name] denies any chest [name], shortness of breath, cough, or hemoptysis.  Rhonda [name] denies any nausea, vomiting, constipation, or diarrhea.  Rhonda [name] has no melena or hematochezia.  Rhonda [name] has no urinary complaints.  Patient offers no further specific complaints today.  REVIEW OF SYSTEMS:   Review of Systems  Constitutional: Positive for malaise/fatigue. Negative for fever and weight loss.  HENT: Negative for congestion and sore throat.   Respiratory: Negative.  Negative for cough and shortness of breath.   Cardiovascular: Negative.  Negative for chest [name], palpitations and leg swelling.  Gastrointestinal: Negative.  Negative for abdominal [name], blood in stool, constipation, diarrhea, melena, nausea and vomiting.  Genitourinary: Negative.  Negative for dysuria.  Musculoskeletal: Negative.  Negative for back [name].  Skin: Negative.  Negative for rash.  Neurological: Positive for tingling, sensory change and weakness. Negative for focal weakness.  Psychiatric/Behavioral: Negative.  The patient is not nervous/anxious and does not have insomnia.     As per HPI. Otherwise, a complete review of systems is negative.  PAST MEDICAL HISTORY: Past Medical  History:  Diagnosis Date  . Actinic keratosis   . Arthritis    hands  . Benign neoplasm of ascending colon   . Cataract   . GERD (gastroesophageal reflux disease)   . Hyperlipemia   . Hypertension 01/31/2015  . Hypothyroidism   . Melanoma ([place]) 04/28/2018   R mid to distal ant lat thigh. MM, SS, tumor thickness 0.84m, antatomic level II. Excised: 05/13/18, margins free  . [place] 04/18[place]  . Multiple [place]oma in remission ([place] 04/18/2016  . Osteoporosis   . Shingles   . Skin cancer    basal cell  . Squamous cell carcinoma of skin 02/04/2014   Left pretibial. KA-like pattern  . Squamous cell carcinoma of skin 09/15/2015   Right lat. superior ankle are. Superficial infiltration. Tx: EDC  . Squamous cell carcinoma of skin 10/15/2016   Left medial mid lower leg. Tx: EDC  . Squamous cell carcinoma of skin 11/27/2018   Right dorsum proximal forearm.  . Squamous cell carcinoma of skin 09/09/2019   Right cheek lat. to mid nasolabial area. Tx: EDC  . Squamous cell carcinoma of skin 12/09/2019   right pretibial below knee    PAST SURGICAL HISTORY: Past Surgical History:  Procedure Laterality Date  . BREAST EXCISIONAL BIOPSY Right 15+ yrs ago   EXCISIONAL - NEG  . CATARACT EXTRACTION W/ INTRAOCULAR LENS IMPLANT    . CHOLECYSTECTOMY N/A 03/14/2017   Procedure: LAPAROSCOPIC CHOLECYSTECTOMY;  Surgeon: CFlorene Glen MD;  Location: [facility];  Service: General;  Laterality: N/A;  . COLONOSCOPY WITH PROPOFOL N/A 02/21/2015   Procedure: COLONOSCOPY WITH PROPOFOL;  Surgeon: DLucilla Lame MD;  [place]  Service: Endoscopy;  Laterality: N/A;  . LAPAROSCOPIC  HYSTERECTOMY  1980  . POLYPECTOMY  02/21/2015   Procedure: POLYPECTOMY;  Surgeon: Lucilla Lame, MD;  Location: [place];  Service: Endoscopy;;    FAMILY HISTORY: Family History  Problem Relation Age of Onset  . Heart disease Mother   . Stroke Father   . Heart disease Brother   . Kidney  cancer Neg Hx   . Prostate cancer Neg Hx   . Bladder Cancer Neg Hx   . Breast cancer Neg Hx     ADVANCED DIRECTIVES (Y/N):  N  HEALTH MAINTENANCE: Social History   Tobacco Use  . Smoking status: Never Smoker  . Smokeless tobacco: Never Used  Vaping Use  . Vaping Use: Never used  Substance Use Topics  . Alcohol use: No    Alcohol/week: 0.0 standard drinks  . Drug use: No     Colonoscopy:  PAP:  Bone density:  Lipid panel:  Allergies  Allergen Reactions  . Revlimid [Lenalidomide]     Current Outpatient Medications  Medication Sig Dispense Refill  . aspirin EC 81 MG tablet Take 81 mg by mouth daily.    . Cyanocobalamin (B-12 PO) Take 500 mcg by mouth daily.     . fexofenadine (ALLEGRA) 30 MG tablet Take 30 mg by mouth daily.     Marland Kitchen levothyroxine (SYNTHROID) 50 MCG tablet TAKE 1 TABLET BY MOUTH DAILY BEFORE BREAKFAST 90 tablet 3  . Multiple Vitamins-Minerals (CENTRUM SILVER PO) Take 1 tablet by mouth daily.    . pomalidomide (POMALYST) 2 MG capsule TAKE 1 CAPSULE BY MOUTH EVERY DAY FOR 21 DAYS, FOLLOWED BY 7 DAYS OFF 21 capsule 0  . EPINEPHrine 0.3 mg/0.3 mL IJ SOAJ injection  (Patient not taking: Reported on 05/24/2020)    . triamcinolone (NASACORT) 55 MCG/ACT AERO nasal inhaler Place 2 sprays into the nose 2 (two) times daily as needed.  (Patient not taking: Reported on 05/24/2020)    . UNABLE TO FIND Inject 1 Dose as directed once a week. Allergy injections once a week      No current facility-administered medications for this visit.    OBJECTIVE: Vitals:   05/24/20 1335  BP: 138/76  Pulse: (!) 55  Resp: 18  Temp: 97.9 F (36.6 C)  SpO2: 98%     Body mass index is 22.68 kg/m.    ECOG FS:0 - Asymptomatic  General: Well-developed, well-nourished, no acute distress. Eyes: Pink conjunctiva, anicteric sclera. HEENT: Normocephalic, moist mucous membranes. Lungs: No audible wheezing or coughing. Heart: Regular rate and rhythm. Abdomen: Soft, nontender, no  obvious distention. Musculoskeletal: No edema, cyanosis, or clubbing. Neuro: Alert, answering all questions appropriately. Cranial nerves grossly intact. Skin: No rashes or petechiae noted. Psych: Normal affect.   LAB RESULTS:  Lab Results  Component Value Date   NA 137 05/24/2020   K 4.0 05/24/2020   CL 104 05/24/2020   CO2 20 (L) 05/24/2020   GLUCOSE 85 05/24/2020   BUN 22 05/24/2020   CREATININE 0.85 05/24/2020   CALCIUM 8.7 (L) 05/24/2020   PROT 6.5 03/04/2017   ALBUMIN 4.2 03/04/2017   AST 24 03/04/2017   ALT 16 03/04/2017   ALKPHOS 49 03/04/2017   BILITOT 0.5 03/04/2017   GFRNONAA >60 05/24/2020   GFRAA 40 (L) 03/01/2020    Lab Results  Component Value Date   WBC 4.2 05/24/2020   NEUTROABS 1.6 (L) 05/24/2020   HGB 10.9 (L) 05/24/2020   HCT 32.5 (L) 05/24/2020   MCV 98.2 05/24/2020   PLT 204 05/24/2020  Lab Results  Component Value Date   TOTALPROTELP 6.0 04/25/2020   ALBUMINELP 3.8 04/25/2020   A1GS 0.2 04/25/2020   A2GS 0.7 04/25/2020   BETS 0.8 04/25/2020   GAMS 0.5 04/25/2020   MSPIKE 0.2 (H) 04/25/2020   SPEI Comment 04/25/2020     STUDIES: No results found.  ASSESSMENT:  Multiple myeloma in relapse.  PLAN:    1.  Multiple myeloma in relapse: Bone marrow biopsy on April 09, 2016 revealed 44% plasma cells and normal cytogenetics. Given the results of her metastatic bone survey on March 12, 2016, her elevated lambda free chains, and hypercalcemia, patient fit the criteria for multiple myeloma and was initially treated with single agent Revlimid. Metastatic bone survey on February 12, 2019 revealed new small lesions in her calvarium.  Patient's M spike remains unchanged at 0.2.  IgG, IgM, and IgA are all mildly decreased and unchanged.  Lambda light chains continue to trend down and are now 1646.8.  Patient could not tolerate Revlimid and was switched to pomalidomide 2 mg daily for 21 days with a 7-day break.  Proceed with cycle 10 of pomalidomide  today.  Continue with dose reduced Zometa.  Return to clinic in 4 weeks for further evaluation and continuation of treatment.   2.  Chronic renal insufficiency: Resolved. 3.  Anemia: Chronic and unchanged.  Hemoglobin 10.9 today. 4.  Leukopenia: Resolved. 5.  Peripheral neuropathy: Chronic and unchanged.  Patient was previously given a referral to acupuncture which Rhonda [name] said did not help much.  6.  Constipation: Patient does not complain of this today.  Continue OTC remedies as needed. 7.  Hypercalcemia: Resolved.  Proceed with Zometa as above.   Patient expressed understanding and was in agreement with this plan. Rhonda [name] also understands that Rhonda [name] can call clinic at any time with any questions, concerns, or complaints.    Lloyd Huger, MD 05/25/20 6:44 AM

## 2020-05-24 ENCOUNTER — Inpatient Hospital Stay: Payer: Medicare PPO | Attending: Oncology

## 2020-05-24 ENCOUNTER — Inpatient Hospital Stay: Payer: Medicare PPO

## 2020-05-24 ENCOUNTER — Inpatient Hospital Stay (HOSPITAL_BASED_OUTPATIENT_CLINIC_OR_DEPARTMENT_OTHER): Payer: Medicare PPO | Admitting: Oncology

## 2020-05-24 VITALS — BP 138/76 | HR 55 | Temp 97.9°F | Resp 18 | Wt 124.0 lb

## 2020-05-24 DIAGNOSIS — C9000 Multiple myeloma not having achieved remission: Secondary | ICD-10-CM

## 2020-05-24 DIAGNOSIS — R531 Weakness: Secondary | ICD-10-CM | POA: Diagnosis not present

## 2020-05-24 DIAGNOSIS — G629 Polyneuropathy, unspecified: Secondary | ICD-10-CM | POA: Diagnosis not present

## 2020-05-24 DIAGNOSIS — D649 Anemia, unspecified: Secondary | ICD-10-CM | POA: Insufficient documentation

## 2020-05-24 DIAGNOSIS — Z85820 Personal history of malignant melanoma of skin: Secondary | ICD-10-CM | POA: Diagnosis not present

## 2020-05-24 DIAGNOSIS — C9001 Multiple myeloma in remission: Secondary | ICD-10-CM

## 2020-05-24 DIAGNOSIS — C9002 Multiple myeloma in relapse: Secondary | ICD-10-CM | POA: Diagnosis not present

## 2020-05-24 DIAGNOSIS — R5383 Other fatigue: Secondary | ICD-10-CM | POA: Insufficient documentation

## 2020-05-24 LAB — CBC WITH DIFFERENTIAL/PLATELET
Abs Immature Granulocytes: 0.03 10*3/uL (ref 0.00–0.07)
Basophils Absolute: 0.1 10*3/uL (ref 0.0–0.1)
Basophils Relative: 2 %
Eosinophils Absolute: 0 10*3/uL (ref 0.0–0.5)
Eosinophils Relative: 1 %
HCT: 32.5 % — ABNORMAL LOW (ref 36.0–46.0)
Hemoglobin: 10.9 g/dL — ABNORMAL LOW (ref 12.0–15.0)
Immature Granulocytes: 1 %
Lymphocytes Relative: 34 %
Lymphs Abs: 1.4 10*3/uL (ref 0.7–4.0)
MCH: 32.9 pg (ref 26.0–34.0)
MCHC: 33.5 g/dL (ref 30.0–36.0)
MCV: 98.2 fL (ref 80.0–100.0)
Monocytes Absolute: 1.1 10*3/uL — ABNORMAL HIGH (ref 0.1–1.0)
Monocytes Relative: 25 %
Neutro Abs: 1.6 10*3/uL — ABNORMAL LOW (ref 1.7–7.7)
Neutrophils Relative %: 37 %
Platelets: 204 10*3/uL (ref 150–400)
RBC: 3.31 MIL/uL — ABNORMAL LOW (ref 3.87–5.11)
RDW: 13.3 % (ref 11.5–15.5)
WBC: 4.2 10*3/uL (ref 4.0–10.5)
nRBC: 0 % (ref 0.0–0.2)

## 2020-05-24 LAB — BASIC METABOLIC PANEL
Anion gap: 13 (ref 5–15)
BUN: 22 mg/dL (ref 8–23)
CO2: 20 mmol/L — ABNORMAL LOW (ref 22–32)
Calcium: 8.7 mg/dL — ABNORMAL LOW (ref 8.9–10.3)
Chloride: 104 mmol/L (ref 98–111)
Creatinine, Ser: 0.85 mg/dL (ref 0.44–1.00)
GFR, Estimated: >60 mL/min (ref >60)
Glucose, Bld: 85 mg/dL (ref 70–99)
Potassium: 4 mmol/L (ref 3.5–5.1)
Sodium: 137 mmol/L (ref 135–145)

## 2020-05-24 MED ORDER — ZOLEDRONIC ACID 4 MG/5ML IV CONC
3.0000 mg | Freq: Once | INTRAVENOUS | Status: AC
  Administered 2020-05-24: 15:00:00 3 mg via INTRAVENOUS
  Filled 2020-05-24: qty 3.75

## 2020-05-24 MED ORDER — SODIUM CHLORIDE 0.9 % IV SOLN
Freq: Once | INTRAVENOUS | Status: AC
  Filled 2020-05-24: qty 250

## 2020-05-24 NOTE — Progress Notes (Signed)
1502- Patient tolerated Zometa infusion well. Patient stable and discharged to home at this time.

## 2020-05-25 ENCOUNTER — Other Ambulatory Visit: Payer: Self-pay | Admitting: Hospice and Palliative Medicine

## 2020-05-25 ENCOUNTER — Telehealth: Payer: Self-pay

## 2020-05-25 LAB — IGG, IGA, IGM
IgA: 30 mg/dL — ABNORMAL LOW (ref 64–422)
IgG (Immunoglobin G), Serum: 462 mg/dL — ABNORMAL LOW (ref 586–1602)
IgM (Immunoglobulin M), Srm: 10 mg/dL — ABNORMAL LOW (ref 26–217)

## 2020-05-25 LAB — KAPPA/LAMBDA LIGHT CHAINS
Kappa free light chain: 16.9 mg/L (ref 3.3–19.4)
Kappa, lambda light chain ratio: 0.01 — ABNORMAL LOW (ref 0.26–1.65)
Lambda free light chains: 1507.4 mg/L — ABNORMAL HIGH (ref 5.7–26.3)

## 2020-05-25 MED ORDER — FLUCONAZOLE 150 MG PO TABS: 150.0000 mg | ORAL_TABLET | Freq: Once | ORAL | 0 refills | Status: AC

## 2020-05-26 ENCOUNTER — Other Ambulatory Visit: Payer: Self-pay

## 2020-05-26 LAB — PROTEIN ELECTROPHORESIS, SERUM
A/G Ratio: 1.6 (ref 0.7–1.7)
Albumin ELP: 3.4 g/dL (ref 2.9–4.4)
Alpha-1-Globulin: 0.2 g/dL (ref 0.0–0.4)
Alpha-2-Globulin: 0.7 g/dL (ref 0.4–1.0)
Beta Globulin: 0.7 g/dL (ref 0.7–1.3)
Gamma Globulin: 0.5 g/dL (ref 0.4–1.8)
Globulin, Total: 2.1 g/dL — ABNORMAL LOW (ref 2.2–3.9)
M-Spike, %: 0.2 g/dL — ABNORMAL HIGH
Total Protein ELP: 5.5 g/dL — ABNORMAL LOW (ref 6.0–8.5)

## 2020-05-26 MED ORDER — FLUCONAZOLE 150 MG PO TABS: ORAL_TABLET | ORAL | 0 refills | Status: DC

## 2020-05-26 NOTE — Telephone Encounter (Signed)
LMOM that we sent something to her pharmacy

## 2020-05-27 DIAGNOSIS — J301 Allergic rhinitis due to pollen: Secondary | ICD-10-CM | POA: Diagnosis not present

## 2020-05-28 ENCOUNTER — Other Ambulatory Visit: Payer: Self-pay | Admitting: Oncology

## 2020-05-28 DIAGNOSIS — C9000 Multiple myeloma not having achieved remission: Secondary | ICD-10-CM

## 2020-05-31 ENCOUNTER — Other Ambulatory Visit: Payer: Self-pay | Admitting: *Deleted

## 2020-05-31 DIAGNOSIS — J301 Allergic rhinitis due to pollen: Secondary | ICD-10-CM | POA: Diagnosis not present

## 2020-05-31 NOTE — Telephone Encounter (Signed)
Already sent 05/29/20

## 2020-06-01 ENCOUNTER — Other Ambulatory Visit: Payer: Self-pay

## 2020-06-01 DIAGNOSIS — C9000 Multiple myeloma not having achieved remission: Secondary | ICD-10-CM

## 2020-06-01 MED ORDER — POMALIDOMIDE 2 MG PO CAPS: ORAL_CAPSULE | ORAL | 0 refills | Status: DC

## 2020-06-07 ENCOUNTER — Other Ambulatory Visit: Payer: Self-pay

## 2020-06-07 ENCOUNTER — Ambulatory Visit
Admission: RE | Admit: 2020-06-07 | Discharge: 2020-06-07 | Disposition: A | Discharge status: Home / Self Care | Payer: Medicare PPO | Source: Ambulatory Visit | Attending: Internal Medicine | Admitting: Internal Medicine

## 2020-06-07 DIAGNOSIS — J301 Allergic rhinitis due to pollen: Secondary | ICD-10-CM | POA: Diagnosis not present

## 2020-06-07 DIAGNOSIS — Z1231 Encounter for screening mammogram for malignant neoplasm of breast: Secondary | ICD-10-CM | POA: Diagnosis not present

## 2020-06-17 DIAGNOSIS — J301 Allergic rhinitis due to pollen: Secondary | ICD-10-CM | POA: Diagnosis not present

## 2020-06-21 ENCOUNTER — Other Ambulatory Visit: Payer: Medicare PPO

## 2020-06-21 ENCOUNTER — Ambulatory Visit: Payer: Medicare PPO | Admitting: Oncology

## 2020-06-21 ENCOUNTER — Ambulatory Visit: Payer: Medicare PPO

## 2020-06-24 DIAGNOSIS — J301 Allergic rhinitis due to pollen: Secondary | ICD-10-CM | POA: Diagnosis not present

## 2020-06-25 ENCOUNTER — Other Ambulatory Visit: Payer: Self-pay | Admitting: Oncology

## 2020-06-25 DIAGNOSIS — C9000 Multiple myeloma not having achieved remission: Secondary | ICD-10-CM

## 2020-06-27 ENCOUNTER — Inpatient Hospital Stay: Payer: Medicare PPO

## 2020-06-27 ENCOUNTER — Inpatient Hospital Stay: Payer: Medicare PPO | Admitting: Oncology

## 2020-06-27 ENCOUNTER — Inpatient Hospital Stay: Payer: Medicare PPO | Admitting: Pharmacist

## 2020-06-28 ENCOUNTER — Telehealth: Payer: Self-pay | Admitting: *Deleted

## 2020-06-28 NOTE — Telephone Encounter (Signed)
Pomalyst prescription sent needs [employer] auth #, Please resubmit prescription with new auth number

## 2020-06-29 ENCOUNTER — Other Ambulatory Visit: Payer: Self-pay | Admitting: *Deleted

## 2020-06-29 DIAGNOSIS — C9000 Multiple myeloma not having achieved remission: Secondary | ICD-10-CM

## 2020-06-29 MED ORDER — POMALIDOMIDE 2 MG PO CAPS: ORAL_CAPSULE | ORAL | 0 refills | Status: DC

## 2020-06-29 NOTE — Telephone Encounter (Signed)
Done. Thanks.

## 2020-07-01 ENCOUNTER — Other Ambulatory Visit: Payer: Self-pay

## 2020-07-01 ENCOUNTER — Inpatient Hospital Stay: Payer: Medicare PPO

## 2020-07-01 ENCOUNTER — Inpatient Hospital Stay: Payer: Medicare PPO | Attending: Oncology | Admitting: Nurse Practitioner

## 2020-07-01 ENCOUNTER — Encounter: Payer: Self-pay | Admitting: Nurse Practitioner

## 2020-07-01 ENCOUNTER — Encounter: Payer: Self-pay | Admitting: Pharmacist

## 2020-07-01 VITALS — BP 141/66 | HR 60 | Temp 98.7°F | Resp 20 | Wt 125.5 lb

## 2020-07-01 DIAGNOSIS — G629 Polyneuropathy, unspecified: Secondary | ICD-10-CM | POA: Diagnosis not present

## 2020-07-01 DIAGNOSIS — Z85820 Personal history of malignant melanoma of skin: Secondary | ICD-10-CM | POA: Insufficient documentation

## 2020-07-01 DIAGNOSIS — N189 Chronic kidney disease, unspecified: Secondary | ICD-10-CM | POA: Insufficient documentation

## 2020-07-01 DIAGNOSIS — K5900 Constipation, unspecified: Secondary | ICD-10-CM | POA: Diagnosis not present

## 2020-07-01 DIAGNOSIS — C9001 Multiple myeloma in remission: Secondary | ICD-10-CM

## 2020-07-01 DIAGNOSIS — D649 Anemia, unspecified: Secondary | ICD-10-CM | POA: Insufficient documentation

## 2020-07-01 DIAGNOSIS — C9000 Multiple myeloma not having achieved remission: Secondary | ICD-10-CM

## 2020-07-01 DIAGNOSIS — I129 Hypertensive chronic kidney disease with stage 1 through stage 4 chronic kidney disease, or unspecified chronic kidney disease: Secondary | ICD-10-CM | POA: Insufficient documentation

## 2020-07-01 DIAGNOSIS — C9002 Multiple myeloma in relapse: Secondary | ICD-10-CM | POA: Diagnosis not present

## 2020-07-01 LAB — CBC WITH DIFFERENTIAL/PLATELET
Abs Immature Granulocytes: 0.02 10*3/uL (ref 0.00–0.07)
Basophils Absolute: 0.1 10*3/uL (ref 0.0–0.1)
Basophils Relative: 2 %
Eosinophils Absolute: 0.1 10*3/uL (ref 0.0–0.5)
Eosinophils Relative: 2 %
HCT: 33.9 % — ABNORMAL LOW (ref 36.0–46.0)
Hemoglobin: 11.8 g/dL — ABNORMAL LOW (ref 12.0–15.0)
Immature Granulocytes: 1 %
Lymphocytes Relative: 24 %
Lymphs Abs: 1 10*3/uL (ref 0.7–4.0)
MCH: 33.7 pg (ref 26.0–34.0)
MCHC: 34.8 g/dL (ref 30.0–36.0)
MCV: 96.9 fL (ref 80.0–100.0)
Monocytes Absolute: 0.4 10*3/uL (ref 0.1–1.0)
Monocytes Relative: 9 %
Neutro Abs: 2.5 10*3/uL (ref 1.7–7.7)
Neutrophils Relative %: 62 %
Platelets: 174 10*3/uL (ref 150–400)
RBC: 3.5 MIL/uL — ABNORMAL LOW (ref 3.87–5.11)
RDW: 13.3 % (ref 11.5–15.5)
WBC: 4 10*3/uL (ref 4.0–10.5)
nRBC: 0 % (ref 0.0–0.2)

## 2020-07-01 LAB — BASIC METABOLIC PANEL
Anion gap: 4 — ABNORMAL LOW (ref 5–15)
BUN: 19 mg/dL (ref 8–23)
CO2: 27 mmol/L (ref 22–32)
Calcium: 9.1 mg/dL (ref 8.9–10.3)
Chloride: 106 mmol/L (ref 98–111)
Creatinine, Ser: 1.04 mg/dL — ABNORMAL HIGH (ref 0.44–1.00)
GFR, Estimated: 53 mL/min — ABNORMAL LOW (ref >60)
Glucose, Bld: 85 mg/dL (ref 70–99)
Potassium: 3.6 mmol/L (ref 3.5–5.1)
Sodium: 137 mmol/L (ref 135–145)

## 2020-07-01 MED ORDER — ZOLEDRONIC ACID 4 MG/5ML IV CONC
3.0000 mg | Freq: Once | INTRAVENOUS | Status: AC
  Administered 2020-07-01: 3 mg via INTRAVENOUS
  Filled 2020-07-01: qty 3.75

## 2020-07-01 MED ORDER — SODIUM CHLORIDE 0.9 % IV SOLN
Freq: Once | INTRAVENOUS | Status: AC
  Filled 2020-07-01: qty 250

## 2020-07-01 NOTE — Progress Notes (Signed)
[place]  [phone] [phone]  ID: Rhonda [name] OB: 06/10/1934  MR#: [id number]  OVZ#:[id number]  Patient Care Team: Lavera Guise, MD as PCP - General (Internal Medicine) Lloyd Huger, MD as Consulting Physician (Oncology)   CHIEF COMPLAINT: Multiple myeloma in relapse   INTERVAL HISTORY: Patient returns to clinic today for further evaluation and continuation of Zometa and pomalidomide.  She has chronic mild peripheral neuropathy which is stable and unchanged.  Otherwise she has been tolerating treatments well without significant side effects.  No neurologic complaints.  No recent fevers or illness.  Appetite is good and she denies weight loss.  No chest [name], shortness of breath, cough, hemoptysis.  No nausea, vomiting, constipation, or diarrhea.  No melena or hematochezia.  No urinary complaints.  No further specific complaints today.  REVIEW OF SYSTEMS:   Review of Systems  Constitutional: Negative for fever, malaise/fatigue and weight loss.  HENT: Negative for congestion and sore throat.   Respiratory: Negative.  Negative for cough and shortness of breath.   Cardiovascular: Negative.  Negative for chest [name], palpitations and leg swelling.  Gastrointestinal: Negative.  Negative for abdominal [name], blood in stool, constipation, diarrhea, melena, nausea and vomiting.  Genitourinary: Negative.  Negative for dysuria.  Musculoskeletal: Negative.  Negative for back [name].  Skin: Negative.  Negative for rash.  Neurological: Positive for tingling and sensory change. Negative for focal weakness and weakness.  Psychiatric/Behavioral: Negative.  The patient is not nervous/anxious and does not have insomnia.   As per HPI. Otherwise, a complete review of systems is negative.  PAST MEDICAL HISTORY: Past Medical History:  Diagnosis Date  . Actinic keratosis   . Arthritis    hands  . Benign neoplasm of ascending colon   . Cataract   .  GERD (gastroesophageal reflux disease)   . Hyperlipemia   . Hypertension 01/31/2015  . Hypothyroidism   . Melanoma ([place]) 04/28/2018   R mid to distal ant lat thigh. MM, SS, tumor thickness 0.43m, antatomic level II. Excised: 05/13/18, margins free  . [place] 04/18[place]  . Multiple myeloma in remission ([place] 04/18/2016  . Osteoporosis   . Shingles   . Skin cancer    basal cell  . Squamous cell carcinoma of skin 02/04/2014   Left pretibial. KA-like pattern  . Squamous cell carcinoma of skin 09/15/2015   Right lat. superior ankle are. Superficial infiltration. Tx: EDC  . Squamous cell carcinoma of skin 10/15/2016   Left medial mid lower leg. Tx: EDC  . Squamous cell carcinoma of skin 11/27/2018   Right dorsum proximal forearm.  . Squamous cell carcinoma of skin 09/09/2019   Right cheek lat. to mid nasolabial area. Tx: EDC  . Squamous cell carcinoma of skin 12/09/2019   right pretibial below knee    PAST SURGICAL HISTORY: Past Surgical History:  Procedure Laterality Date  . BREAST EXCISIONAL BIOPSY Right 15+ yrs ago   EXCISIONAL - NEG  . CATARACT EXTRACTION W/ INTRAOCULAR LENS IMPLANT    . CHOLECYSTECTOMY N/A 03/14/2017   Procedure: LAPAROSCOPIC CHOLECYSTECTOMY;  Surgeon: CFlorene Glen MD;  Location: [facility] OR[facility];  Service: General;  Laterality: N/A;  . COLONOSCOPY WITH PROPOFOL N/A 02/21/2015   Procedure: COLONOSCOPY WITH PROPOFOL;  Surgeon: DLucilla Lame MD;  [place]  Service: Endoscopy;  Laterality: N/A;  . [place] . POLYPECTOMY  02/21/2015   Procedure: POLYPECTOMY;  Surgeon: DLucilla Lame MD;  [place]  Service: Endoscopy;;    FAMILY HISTORY: Family History  Problem Relation Age of Onset  . Heart disease Mother   . Stroke Father   . Heart disease Brother   . Kidney cancer Neg Hx   . Prostate cancer Neg Hx   . Bladder Cancer Neg Hx   . Breast cancer Neg Hx     ADVANCED DIRECTIVES (Y/N):   N  HEALTH MAINTENANCE: Social History   Tobacco Use  . Smoking status: Never Smoker  . Smokeless tobacco: Never Used  Vaping Use  . Vaping Use: Never used  Substance Use Topics  . Alcohol use: No    Alcohol/week: 0.0 standard drinks  . Drug use: No     Colonoscopy:  PAP:  Bone density:  Lipid panel:  Allergies  Allergen Reactions  . Revlimid [Lenalidomide]     Current Outpatient Medications  Medication Sig Dispense Refill  . aspirin EC 81 MG tablet Take 81 mg by mouth daily.    . Cyanocobalamin (B-12 PO) Take 500 mcg by mouth daily.     . fexofenadine (ALLEGRA) 30 MG tablet Take 30 mg by mouth daily.     . fluconazole (DIFLUCAN) 150 MG tablet Take one tablet PO once and repeat in three days if symptoms persist 3 tablet 0  . levothyroxine (SYNTHROID) 50 MCG tablet TAKE 1 TABLET BY MOUTH DAILY BEFORE BREAKFAST 90 tablet 3  . Multiple Vitamins-Minerals (CENTRUM SILVER PO) Take 1 tablet by mouth daily.    . pomalidomide (POMALYST) 2 MG capsule TAKE 1 CAPSULE BY MOUTH EVERY DAY FOR 21 DAYS THEN OFF FOR 7 DAYS 21 capsule 0  . UNABLE TO FIND Inject 1 Dose as directed once a week. Allergy injections once a week    . EPINEPHrine 0.3 mg/0.3 mL IJ SOAJ injection  (Patient not taking: No sig reported)    . triamcinolone (NASACORT) 55 MCG/ACT AERO nasal inhaler Place 2 sprays into the nose 2 (two) times daily as needed.  (Patient not taking: No sig reported)     No current facility-administered medications for this visit.   Facility-Administered Medications Ordered in Other Visits  Medication Dose Route Frequency Provider Last Rate Last Admin  . zolendronic acid (ZOMETA) 3 mg in sodium chloride 0.9 % 100 mL IVPB  3 mg Intravenous Once Lloyd Huger, MD 415 mL/hr at 07/01/20 1039 3 mg at 07/01/20 1039    OBJECTIVE: Vitals:   07/01/20 0937  BP: (!) 141/66  Pulse: 60  Resp: 20  Temp: 98.7 F (37.1 C)  SpO2: 100%     Body mass index is 22.95 kg/m.    ECOG FS:0 -  Asymptomatic  General: Well-developed, well-nourished, no acute distress. Appears stated age.  Eyes: Pink conjunctiva, anicteric sclera. Lungs: Clear to auscultation bilaterally.  No audible wheezing or coughing Heart: Regular rate and rhythm.  Abdomen: Soft, nontender, nondistended.  Musculoskeletal: No edema, cyanosis, or clubbing. Neuro: Alert, answering all questions appropriately. Cranial nerves grossly intact. Skin: No rashes or petechiae noted. Psych: Normal affect.  LAB RESULTS:  Lab Results  Component Value Date   NA 137 07/01/2020   K 3.6 07/01/2020   CL 106 07/01/2020   CO2 27 07/01/2020   GLUCOSE 85 07/01/2020   BUN 19 07/01/2020   CREATININE 1.04 (H) 07/01/2020   CALCIUM 9.1 07/01/2020   PROT 6.5 03/04/2017   ALBUMIN 4.2 03/04/2017   AST 24 03/04/2017   ALT 16 03/04/2017   ALKPHOS 49 03/04/2017   BILITOT 0.5 03/04/2017  GFRNONAA 53 (L) 07/01/2020   GFRAA 40 (L) 03/01/2020    Lab Results  Component Value Date   WBC 4.0 07/01/2020   NEUTROABS 2.5 07/01/2020   HGB 11.8 (L) 07/01/2020   HCT 33.9 (L) 07/01/2020   MCV 96.9 07/01/2020   PLT 174 07/01/2020   Lab Results  Component Value Date   TOTALPROTELP 5.5 (L) 05/24/2020   ALBUMINELP 3.4 05/24/2020   A1GS 0.2 05/24/2020   A2GS 0.7 05/24/2020   BETS 0.7 05/24/2020   GAMS 0.5 05/24/2020   MSPIKE 0.2 (H) 05/24/2020   SPEI Comment 05/24/2020    STUDIES: MM 3D SCREEN BREAST BILATERAL  Result Date: 06/08/2020 CLINICAL DATA:  Screening. EXAM: DIGITAL SCREENING BILATERAL MAMMOGRAM WITH TOMO AND CAD COMPARISON:  Previous exam(s). ACR Breast Density Category c: The breast tissue is heterogeneously dense, which may obscure small masses. FINDINGS: There are no findings suspicious for malignancy. Images were processed with CAD. IMPRESSION: No mammographic evidence of malignancy. A result letter of this screening mammogram will be mailed directly to the patient. RECOMMENDATION: Screening mammogram in one year.  (Code:SM-B-01Y) BI-RADS CATEGORY  1: Negative. Electronically Signed   By: Dorise Bullion III M.D   On: 06/08/2020 13:12    ASSESSMENT:  Multiple myeloma in relapse.  PLAN:    1.  Multiple myeloma in relapse: Bone marrow biopsy on 04/09/2016 revealed 44% plasma cells and normal cytogenetics.  Metastatic bone survey on 03/12/2016, elevated lambda light chains, hypercalcemia, patient fit criteria for multiple myeloma was treated with single agent Revlimid.  Metastatic bone survey on 02/12/2019 revealed new small lesions in her calvarium.  M spike stable and unchanged previously. Today's pending. IgG, IgM, and igA are all mildly decreased and unchanged.  Lambda light chains continue to trend down and are now 1507.4.  Patient was unable to tolerate Revlimid and was switched to pomalidomide 2 mg daily for 21 days with 7-day break.  Labs today reviewed and acceptable to proceed with cycle 11 of pomalidomide today.  Continue dose reduced Zometa for history of hypercalcemia and bone disease.  Return to clinic in 4 weeks for further evaluation and consideration of continuation of treatment. 2.  Chronic renal insufficiency: stable. Baseline Cr around 1.0.  3.  Anemia: Chronic and unchanged.  Hemoglobin 11.8 today. 4.  Leukopenia: Resolved. 5.  Peripheral neuropathy: Chronic and unchanged. S/p acupuncture w/o improvement.  Does not interfere with ADLs.  Continue to monitor. 6.  Constipation:continue otc medications. Monitor.  7. Hypercalcemia: resolved. Ca 9.1 today. Proceed with zometa today.   Patient expressed understanding and was in agreement with this plan. She also understands that She can call clinic at any time with any questions, concerns, or complaints.    Verlon Au, NP 07/01/20 10:50 AM

## 2020-07-01 NOTE — Progress Notes (Signed)
Pt tolerated infusion well with no signs of complications. VSS. Pt stable for discharge.   Ardis Lawley  

## 2020-07-01 NOTE — Progress Notes (Signed)
Patient states she has had some leg numbness since last week. Patient states "They seem not to want to work sometimes"

## 2020-07-01 NOTE — Progress Notes (Signed)
[place]  [phone] [phone]  Patient Care Team: Lavera Guise, MD as PCP - General (Internal Medicine) Lloyd Huger, MD as Consulting Physician (Oncology)   Name of the patient: Rhonda [name]  [id number]  11-Jun-1934   Date of visit: 07/01/20  HPI: Patient is a 87 y.o. female with relapsed multiple myeloma.  Reason for Consult: Oral chemotherapy follow-up for Pomalyst (pomalidomide) therapy.   PAST MEDICAL HISTORY: Past Medical History:  Diagnosis Date  . Actinic keratosis   . Arthritis    hands  . Benign neoplasm of ascending colon   . Cataract   . GERD (gastroesophageal reflux disease)   . Hyperlipemia   . Hypertension 01/31/2015  . Hypothyroidism   . Melanoma ([place]) 04/28/2018   R mid to distal ant lat thigh. MM, SS, tumor thickness 0.49m, antatomic level II. Excised: 05/13/18, margins free  . Multiple myeloma ([place] 04/18/2016  . [place] ([place] 04/18/2016  . Osteoporosis   . Shingles   . Skin cancer    basal cell  . Squamous cell carcinoma of skin 02/04/2014   Left pretibial. KA-like pattern  . Squamous cell carcinoma of skin 09/15/2015   Right lat. superior ankle are. Superficial infiltration. Tx: EDC  . Squamous cell carcinoma of skin 10/15/2016   Left medial mid lower leg. Tx: EDC  . Squamous cell carcinoma of skin 11/27/2018   Right dorsum proximal forearm.  . Squamous cell carcinoma of skin 09/09/2019   Right cheek lat. to mid nasolabial area. Tx: EDC  . Squamous cell carcinoma of skin 12/09/2019   right pretibial below knee    PAST SURGICAL HISTORY:  Past Surgical History:  Procedure Laterality Date  . BREAST EXCISIONAL BIOPSY Right 15+ yrs ago   EXCISIONAL - NEG  . CATARACT EXTRACTION W/ INTRAOCULAR LENS IMPLANT    . CHOLECYSTECTOMY N/A 03/14/2017   Procedure: LAPAROSCOPIC CHOLECYSTECTOMY;  Surgeon: CFlorene Glen MD;  Location: [facility] OR[facility];   Service: General;  Laterality: N/A;  . COLONOSCOPY WITH PROPOFOL N/A 02/21/2015   Procedure: COLONOSCOPY WITH PROPOFOL;  Surgeon: DLucilla Lame MD;  [place] [facility]  Service: Endoscopy;  Laterality: N/A;  . [place] . POLYPE[place]OM[place]  02/21/2015   Procedure: POLYPECTOMY;  Surgeon: DLucilla Lame MD;  [place]  Service: Endoscopy;;    HEMATOLOGY/ONCOLOGY HISTORY:  Oncology History   No history exists.    ALLERGIES:  is allergic to revlimid [lenalidomide].  MEDICATIONS:  Current Outpatient Medications  Medication Sig Dispense Refill  . aspirin EC 81 MG tablet Take 81 mg by mouth daily.    . Cyanocobalamin (B-12 PO) Take 500 mcg by mouth daily.     .Marland KitchenEPINEPHrine 0.3 mg/0.3 mL IJ SOAJ injection  (Patient not taking: No sig reported)    . fexofenadine (ALLEGRA) 30 MG tablet Take 30 mg by mouth daily.     . fluconazole (DIFLUCAN) 150 MG tablet Take one tablet PO once and repeat in three days if symptoms persist 3 tablet 0  . levothyroxine (SYNTHROID) 50 MCG tablet TAKE 1 TABLET BY MOUTH DAILY BEFORE BREAKFAST 90 tablet 3  . Multiple Vitamins-Minerals (CENTRUM SILVER PO) Take 1 tablet by mouth daily.    . pomalidomide (POMALYST) 2 MG capsule TAKE 1 CAPSULE BY MOUTH EVERY DAY FOR 21 DAYS THEN OFF FOR 7 DAYS 21 capsule 0  . triamcinolone (NASACORT) 55 MCG/ACT AERO nasal inhaler Place 2 sprays into the nose 2 (  two) times daily as needed.  (Patient not taking: No sig reported)    . UNABLE TO FIND Inject 1 Dose as directed once a week. Allergy injections once a week     No current facility-administered medications for this visit.    VITAL SIGNS: There were no vitals taken for this visit. There were no vitals filed for this visit.  Estimated body mass index is 22.95 kg/m as calculated from the following:   Height as of 03/10/20: '5\' 2"'  (1.575 m).   Weight as of an earlier encounter on 07/01/20: 56.9 kg (125 lb 8 oz).  LABS: CBC:     Component Value Date/Time   WBC 4.0 07/01/2020 0913   HGB 11.8 (L) 07/01/2020 0913   HCT 33.9 (L) 07/01/2020 0913   PLT 174 07/01/2020 0913   MCV 96.9 07/01/2020 0913   NEUTROABS 2.5 07/01/2020 0913   LYMPHSABS 1.0 07/01/2020 0913   MONOABS 0.4 07/01/2020 0913   EOSABS 0.1 07/01/2020 0913   BASOSABS 0.1 07/01/2020 0913   Comprehensive Metabolic Panel:    Component Value Date/Time   NA 137 07/01/2020 0913   K 3.6 07/01/2020 0913   CL 106 07/01/2020 0913   CO2 27 07/01/2020 0913   BUN 19 07/01/2020 0913   CREATININE 1.04 (H) 07/01/2020 0913   GLUCOSE 85 07/01/2020 0913   CALCIUM 9.1 07/01/2020 0913   AST 24 03/04/2017 1327   ALT 16 03/04/2017 1327   ALKPHOS 49 03/04/2017 1327   BILITOT 0.5 03/04/2017 1327   PROT 6.5 03/04/2017 1327   ALBUMIN 4.2 03/04/2017 1327    RADIOGRAPHIC STUDIES: MM 3D SCREEN BREAST BILATERAL  Result Date: 06/08/2020 CLINICAL DATA:  Screening. EXAM: DIGITAL SCREENING BILATERAL MAMMOGRAM WITH TOMO AND CAD COMPARISON:  Previous exam(s). ACR Breast Density Category c: The breast tissue is heterogeneously dense, which may obscure small masses. FINDINGS: There are no findings suspicious for malignancy. Images were processed with CAD. IMPRESSION: No mammographic evidence of malignancy. A result letter of this screening mammogram will be mailed directly to the patient. RECOMMENDATION: Screening mammogram in one year. (Code:SM-B-01Y) BI-RADS CATEGORY  1: Negative. Electronically Signed   By: Dorise Bullion III M.D   On: 06/08/2020 13:12     Assessment and Plan-  Continue Pomalyst 89m. Follow-up myeloma panels from today's visit.   Oral Chemotherapy Side Effect/Intolerance:  -Constipation and diarrhea: Patient still reporting constipation while on her Pomalyst. She takes Miralax then has diarrhea. We discussed cutting back to using half of her Miralax. During her week off of Pomalyst her bowel movements are normal without any GI medications needed  She has  a good energy level and is able to do her normal daily activities. She reports that keeping up her house keeps her active.   Oral Chemotherapy Adherence: Reports no missed doses  Medication Access Issues: none, Pomalyst in hand  Other: Reported night time leg pain/cramps and occasional hand cramps, she does not feel like this is interrupting her sleep. Will continue to monitor.   No patient barriers to medication adherence identified.   Patient expressed understanding and was in agreement with this plan. She also understands that She can call clinic at any time with any questions, concerns, or complaints.   Thank you for allowing me to participate in the care of this very pleasant patient.   Time Total: 10 minutes  Visit consisted of counseling and education on dealing with issues of symptom management in the setting of serious and potentially life-threatening illness.Greater than 50%  of this time was spent counseling and coordinating care related to the above assessment and plan.  Signed by: Darl Pikes, PharmD, BCPS, Salley Slaughter, CPP Hematology/Oncology Clinical Pharmacist Practitioner [facility] [phone]  07/01/2020 10:24 AM

## 2020-07-03 LAB — IGG, IGA, IGM
IgA: 35 mg/dL — ABNORMAL LOW (ref 64–422)
IgG (Immunoglobin G), Serum: 501 mg/dL — ABNORMAL LOW (ref 586–1602)
IgM (Immunoglobulin M), Srm: 10 mg/dL — ABNORMAL LOW (ref 26–217)

## 2020-07-04 ENCOUNTER — Other Ambulatory Visit: Payer: Self-pay

## 2020-07-04 ENCOUNTER — Ambulatory Visit: Payer: Medicare PPO | Admitting: Dermatology

## 2020-07-04 DIAGNOSIS — Z85820 Personal history of malignant melanoma of skin: Secondary | ICD-10-CM

## 2020-07-04 DIAGNOSIS — L821 Other seborrheic keratosis: Secondary | ICD-10-CM

## 2020-07-04 DIAGNOSIS — L814 Other melanin hyperpigmentation: Secondary | ICD-10-CM | POA: Diagnosis not present

## 2020-07-04 DIAGNOSIS — Z85828 Personal history of other malignant neoplasm of skin: Secondary | ICD-10-CM | POA: Diagnosis not present

## 2020-07-04 DIAGNOSIS — D229 Melanocytic nevi, unspecified: Secondary | ICD-10-CM | POA: Diagnosis not present

## 2020-07-04 DIAGNOSIS — Z1283 Encounter for screening for malignant neoplasm of skin: Secondary | ICD-10-CM | POA: Diagnosis not present

## 2020-07-04 DIAGNOSIS — D1800 Hemangioma unspecified site: Secondary | ICD-10-CM

## 2020-07-04 DIAGNOSIS — L820 Inflamed seborrheic keratosis: Secondary | ICD-10-CM

## 2020-07-04 DIAGNOSIS — L578 Other skin changes due to chronic exposure to nonionizing radiation: Secondary | ICD-10-CM

## 2020-07-04 DIAGNOSIS — L570 Actinic keratosis: Secondary | ICD-10-CM | POA: Diagnosis not present

## 2020-07-04 DIAGNOSIS — Z872 Personal history of diseases of the skin and subcutaneous tissue: Secondary | ICD-10-CM | POA: Diagnosis not present

## 2020-07-04 LAB — KAPPA/LAMBDA LIGHT CHAINS
Kappa free light chain: 20.7 mg/L — ABNORMAL HIGH (ref 3.3–19.4)
Kappa, lambda light chain ratio: 0.01 — ABNORMAL LOW (ref 0.26–1.65)
Lambda free light chains: 1393.2 mg/L — ABNORMAL HIGH (ref 5.7–26.3)

## 2020-07-04 LAB — PROTEIN ELECTROPHORESIS, SERUM
A/G Ratio: 1.6 (ref 0.7–1.7)
Albumin ELP: 3.6 g/dL (ref 2.9–4.4)
Alpha-1-Globulin: 0.2 g/dL (ref 0.0–0.4)
Alpha-2-Globulin: 0.7 g/dL (ref 0.4–1.0)
Beta Globulin: 0.8 g/dL (ref 0.7–1.3)
Gamma Globulin: 0.6 g/dL (ref 0.4–1.8)
Globulin, Total: 2.3 g/dL (ref 2.2–3.9)
M-Spike, %: 0.2 g/dL — ABNORMAL HIGH
Total Protein ELP: 5.9 g/dL — ABNORMAL LOW (ref 6.0–8.5)

## 2020-07-04 NOTE — Progress Notes (Signed)
Follow-Up Visit   Subjective  Rhonda [name] is a 87 y.o. female who presents for the following: TBSE (Patient here for full body skin exam and skin cancer screening. She has a hx of MM, SCC, BCC and AK's. ).  The following portions of the chart were reviewed this encounter and updated as appropriate:   Tobacco  Allergies  Meds  Problems  Med Hx  Surg Hx  Fam Hx     Review of Systems:  No other skin or systemic complaints except as noted in HPI or Assessment and Plan.  Objective  Well appearing patient in no apparent distress; mood and affect are within normal limits.  A full examination was performed including scalp, head, eyes, ears, nose, lips, neck, chest, axillae, abdomen, back, buttocks, bilateral upper extremities, bilateral lower extremities, hands, feet, fingers, toes, fingernails, and toenails. All findings within normal limits unless otherwise noted below.  Objective  bilateral arms (2): Erythematous keratotic or waxy stuck-on papule or plaque.   Objective  Face (7): Erythematous thin papules/macules with gritty scale.    Assessment & Plan  Inflamed seborrheic keratosis (2) bilateral arms  Destruction of lesion - bilateral arms Complexity: simple   Destruction method: cryotherapy   Informed consent: discussed and consent obtained   Timeout:  patient name, date of birth, surgical site, and procedure verified Lesion destroyed using liquid nitrogen: Yes   Region frozen until ice ball extended beyond lesion: Yes   Outcome: patient tolerated procedure well with no complications   Post-procedure details: wound care instructions given    AK (actinic keratosis) (7) Face  Destruction of lesion - Face Complexity: simple   Destruction method: cryotherapy   Informed consent: discussed and consent obtained   Timeout:  patient name, date of birth, surgical site, and procedure verified Lesion destroyed using liquid nitrogen: Yes   Region frozen until ice ball  extended beyond lesion: Yes   Outcome: patient tolerated procedure well with no complications   Post-procedure details: wound care instructions given     Lentigines - Scattered tan macules - Discussed due to sun exposure - Benign, observe - Call for any changes  Seborrheic Keratoses - Stuck-on, waxy, tan-brown papules and plaques  - Discussed benign etiology and prognosis. - Observe - Call for any changes  Melanocytic Nevi - Tan-brown and/or pink-flesh-colored symmetric macules and papules - Benign appearing on exam today - Observation - Call clinic for new or changing moles - Recommend daily use of broad spectrum spf 30+ sunscreen to sun-exposed areas.   Hemangiomas - Red papules - Discussed benign nature - Observe - Call for any changes  Actinic Damage - Chronic, secondary to cumulative UV/sun exposure - diffuse scaly erythematous macules with underlying dyspigmentation - Recommend daily broad spectrum sunscreen SPF 30+ to sun-exposed areas, reapply every 2 hours as needed.  - Call for new or changing lesions.  Skin cancer screening performed today.  History of Melanoma - No evidence of recurrence today at R mid to distal ant lat thigh. MM, SS, tumor thickness 0.8mm - No lymphadenopathy - Recommend regular full body skin exams - Recommend daily broad spectrum sunscreen SPF 30+ to sun-exposed areas, reapply every 2 hours as needed.  - Call if any new or changing lesions are noted between office visits  History of Basal Cell Carcinoma of the Skin - No evidence of recurrence today - Recommend regular full body skin exams - Recommend daily broad spectrum sunscreen SPF 30+ to sun-exposed areas, reapply every 2 hours  as needed.  - Call if any new or changing lesions are noted between office visits  History of Squamous Cell Carcinoma of the Skin - No evidence of recurrence today - No lymphadenopathy - Recommend regular full body skin exams - Recommend daily broad  spectrum sunscreen SPF 30+ to sun-exposed areas, reapply every 2 hours as needed.  - Call if any new or changing lesions are noted between office visits  History of PreCancerous Actinic Keratosis  - site(s) of PreCancerous Actinic Keratosis clear today. - these may recur and new lesions may form requiring treatment to prevent transformation into skin cancer - observe for new or changing spots and contact [facility] for appointment if occur - photoprotection with sun protective clothing; sunglasses and broad spectrum sunscreen with SPF of at least 30 + and frequent self skin exams recommended - yearly exams by a dermatologist recommended for persons with history of PreCancerous Actinic Keratoses  Return in about 6 months (around 01/01/2021) for TBSE.  Graciella Belton, RMA, am acting as scribe for Sarina Ser, MD . Documentation: I have reviewed the above documentation for accuracy and completeness, and I agree with the above.  Sarina Ser, MD

## 2020-07-04 NOTE — Patient Instructions (Signed)

## 2020-07-05 ENCOUNTER — Encounter: Payer: Self-pay | Admitting: Dermatology

## 2020-07-08 DIAGNOSIS — J301 Allergic rhinitis due to pollen: Secondary | ICD-10-CM | POA: Diagnosis not present

## 2020-07-15 DIAGNOSIS — J301 Allergic rhinitis due to pollen: Secondary | ICD-10-CM | POA: Diagnosis not present

## 2020-07-22 DIAGNOSIS — J301 Allergic rhinitis due to pollen: Secondary | ICD-10-CM | POA: Diagnosis not present

## 2020-07-23 ENCOUNTER — Other Ambulatory Visit: Payer: Self-pay | Admitting: Oncology

## 2020-07-23 DIAGNOSIS — C9000 Multiple myeloma not having achieved remission: Secondary | ICD-10-CM

## 2020-07-25 ENCOUNTER — Other Ambulatory Visit: Payer: Self-pay | Admitting: *Deleted

## 2020-07-25 DIAGNOSIS — C9000 Multiple myeloma not having achieved remission: Secondary | ICD-10-CM

## 2020-07-25 MED ORDER — POMALIDOMIDE 2 MG PO CAPS: ORAL_CAPSULE | ORAL | 0 refills | Status: DC

## 2020-07-29 ENCOUNTER — Inpatient Hospital Stay: Payer: Medicare PPO | Attending: Oncology

## 2020-07-29 ENCOUNTER — Inpatient Hospital Stay (HOSPITAL_BASED_OUTPATIENT_CLINIC_OR_DEPARTMENT_OTHER): Payer: Medicare PPO | Admitting: Nurse Practitioner

## 2020-07-29 ENCOUNTER — Other Ambulatory Visit: Payer: Self-pay

## 2020-07-29 ENCOUNTER — Inpatient Hospital Stay: Payer: Medicare PPO

## 2020-07-29 DIAGNOSIS — K5900 Constipation, unspecified: Secondary | ICD-10-CM | POA: Insufficient documentation

## 2020-07-29 DIAGNOSIS — I129 Hypertensive chronic kidney disease with stage 1 through stage 4 chronic kidney disease, or unspecified chronic kidney disease: Secondary | ICD-10-CM | POA: Insufficient documentation

## 2020-07-29 DIAGNOSIS — D649 Anemia, unspecified: Secondary | ICD-10-CM | POA: Insufficient documentation

## 2020-07-29 DIAGNOSIS — C9000 Multiple myeloma not having achieved remission: Secondary | ICD-10-CM

## 2020-07-29 DIAGNOSIS — C9002 Multiple myeloma in relapse: Secondary | ICD-10-CM | POA: Insufficient documentation

## 2020-07-29 DIAGNOSIS — N189 Chronic kidney disease, unspecified: Secondary | ICD-10-CM | POA: Diagnosis not present

## 2020-07-29 DIAGNOSIS — J301 Allergic rhinitis due to pollen: Secondary | ICD-10-CM | POA: Diagnosis not present

## 2020-07-29 DIAGNOSIS — Z85820 Personal history of malignant melanoma of skin: Secondary | ICD-10-CM | POA: Insufficient documentation

## 2020-07-29 DIAGNOSIS — G629 Polyneuropathy, unspecified: Secondary | ICD-10-CM | POA: Insufficient documentation

## 2020-07-29 LAB — CBC WITH DIFFERENTIAL/PLATELET
Abs Immature Granulocytes: 0.01 10*3/uL (ref 0.00–0.07)
Basophils Absolute: 0.1 10*3/uL (ref 0.0–0.1)
Basophils Relative: 2 %
Eosinophils Absolute: 0.1 10*3/uL (ref 0.0–0.5)
Eosinophils Relative: 2 %
HCT: 36.4 % (ref 36.0–46.0)
Hemoglobin: 12 g/dL (ref 12.0–15.0)
Immature Granulocytes: 0 %
Lymphocytes Relative: 22 %
Lymphs Abs: 0.9 10*3/uL (ref 0.7–4.0)
MCH: 32.6 pg (ref 26.0–34.0)
MCHC: 33 g/dL (ref 30.0–36.0)
MCV: 98.9 fL (ref 80.0–100.0)
Monocytes Absolute: 0.4 10*3/uL (ref 0.1–1.0)
Monocytes Relative: 11 %
Neutro Abs: 2.6 10*3/uL (ref 1.7–7.7)
Neutrophils Relative %: 63 %
Platelets: 181 10*3/uL (ref 150–400)
RBC: 3.68 MIL/uL — ABNORMAL LOW (ref 3.87–5.11)
RDW: 13.5 % (ref 11.5–15.5)
WBC: 4.1 10*3/uL (ref 4.0–10.5)
nRBC: 0 % (ref 0.0–0.2)

## 2020-07-29 LAB — BASIC METABOLIC PANEL
Anion gap: 9 (ref 5–15)
BUN: 25 mg/dL — ABNORMAL HIGH (ref 8–23)
CO2: 22 mmol/L (ref 22–32)
Calcium: 9 mg/dL (ref 8.9–10.3)
Chloride: 106 mmol/L (ref 98–111)
Creatinine, Ser: 0.99 mg/dL (ref 0.44–1.00)
GFR, Estimated: 56 mL/min — ABNORMAL LOW (ref >60)
Glucose, Bld: 88 mg/dL (ref 70–99)
Potassium: 4 mmol/L (ref 3.5–5.1)
Sodium: 137 mmol/L (ref 135–145)

## 2020-07-29 LAB — COMPREHENSIVE METABOLIC PANEL
ALT: 14 U/L (ref 0–44)
AST: 19 U/L (ref 15–41)
Albumin: 3.9 g/dL (ref 3.5–5.0)
Alkaline Phosphatase: 63 U/L (ref 38–126)
Anion gap: 8 (ref 5–15)
BUN: 25 mg/dL — ABNORMAL HIGH (ref 8–23)
CO2: 22 mmol/L (ref 22–32)
Calcium: 8.8 mg/dL — ABNORMAL LOW (ref 8.9–10.3)
Chloride: 106 mmol/L (ref 98–111)
Creatinine, Ser: 0.98 mg/dL (ref 0.44–1.00)
GFR, Estimated: 57 mL/min — ABNORMAL LOW (ref >60)
Glucose, Bld: 93 mg/dL (ref 70–99)
Potassium: 3.9 mmol/L (ref 3.5–5.1)
Sodium: 136 mmol/L (ref 135–145)
Total Bilirubin: 0.7 mg/dL (ref 0.3–1.2)
Total Protein: 6.1 g/dL — ABNORMAL LOW (ref 6.5–8.1)

## 2020-07-29 MED ORDER — ZOLEDRONIC ACID 4 MG/5ML IV CONC
3.0000 mg | Freq: Once | INTRAVENOUS | Status: AC
  Administered 2020-07-29: 3 mg via INTRAVENOUS
  Filled 2020-07-29: qty 3.75

## 2020-07-29 MED ORDER — SODIUM CHLORIDE 0.9 % IV SOLN
Freq: Once | INTRAVENOUS | Status: AC
  Filled 2020-07-29: qty 250

## 2020-07-29 MED ORDER — GABAPENTIN 300 MG PO CAPS: 300.0000 mg | ORAL_CAPSULE | Freq: Two times a day (BID) | ORAL | 2 refills | Status: DC | PRN

## 2020-07-29 MED ORDER — ZOLEDRONIC ACID 4 MG/100ML IV SOLN: 4.0000 mg | Freq: Once | INTRAVENOUS | Status: DC

## 2020-07-29 NOTE — Progress Notes (Signed)
Per Beckey Rutter NP okay to proceed with Zometa with Calcium of 8.8.

## 2020-07-29 NOTE — Progress Notes (Signed)
[place]  Telephone:([phone]) [phone] Fax:(336) [phone]  ID: [name] OB: 07/27/1933  MR#: [id number]  KGY#:[id number]  Patient Care Team: Lavera Guise, MD as PCP - General (Internal Medicine) Lloyd Huger, MD as Consulting Physician (Oncology)   CHIEF COMPLAINT: Multiple myeloma in relapse   INTERVAL HISTORY: Patient returns to clinic today for further evaluation and consideration of Zometa and pomalidomide.  She reports worsening peripheral neuropathy in the last week.  Tripping and dropping things.  Otherwise she is tolerating treatments well without significant side effects.  No dizziness or weakness.  No fevers or illness.  Appetite is stable and she denies weight loss.  No chest pain or shortness of breath.  No cough or hemoptysis.  No nausea, vomiting, constipation, diarrhea.  No melena or hematochezia.  No urinary complaints.  No further specific complaints today.  REVIEW OF SYSTEMS:   Review of Systems  Constitutional: Negative for fever, malaise/fatigue and weight loss.  HENT: Negative for congestion and sore throat.   Respiratory: Negative.  Negative for cough and shortness of breath.   Cardiovascular: Negative.  Negative for chest pain, palpitations and leg swelling.  Gastrointestinal: Negative.  Negative for abdominal pain, blood in stool, constipation, diarrhea, melena, nausea and vomiting.  Genitourinary: Negative.  Negative for dysuria.  Musculoskeletal: Negative.  Negative for back pain.  Skin: Negative.  Negative for rash.  Neurological: Positive for tingling and sensory change. Negative for focal weakness and weakness.  Psychiatric/Behavioral: Negative.  The patient is not nervous/anxious and does not have insomnia.   As per HPI. Otherwise, a complete review of systems is negative.  PAST MEDICAL HISTORY: Past Medical History:  Diagnosis Date  . Actinic keratosis   . Arthritis    hands  . Benign neoplasm of ascending colon   .  Cataract   . GERD (gastroesophageal reflux disease)   . Hyperlipemia   . Hypertension 01/31/2015  . Hypothyroidism   . Melanoma ([place]) 04/28/2018   R mid to distal ant lat thigh. MM, SS, tumor thickness 0.33m, antatomic level II. Excised: 05/13/18, margins free  . Multiple myeloma ([place] 04/18/2016  . [place] 04/18/2016  . Osteoporosis   . Shingles   . Skin cancer    basal cell  . Squamous cell carcinoma of skin 02/04/2014   Left pretibial. KA-like pattern  . Squamous cell carcinoma of skin 09/15/2015   Right lat. superior ankle are. Superficial infiltration. Tx: EDC  . Squamous cell carcinoma of skin 10/15/2016   Left medial mid lower leg. Tx: EDC  . Squamous cell carcinoma of skin 11/27/2018   Right dorsum proximal forearm.  . Squamous cell carcinoma of skin 09/09/2019   Right cheek lat. to mid nasolabial area. Tx: EDC  . Squamous cell carcinoma of skin 12/09/2019   right pretibial below knee    PAST SURGICAL HISTORY: Past Surgical History:  Procedure Laterality Date  . BREAST EXCISIONAL BIOPSY Right 15+ yrs ago   EXCISIONAL - NEG  . CATARACT EXTRACTION W/ INTRAOCULAR LENS IMPLANT    . CHOLECYSTECTOMY N/A 03/14/2017   Procedure: LAPAROSCOPIC CHOLECYSTECTOMY;  Surgeon: CFlorene Glen MD;  Location: [facility];  Service: General;  Laterality: N/A;  . COLONOSCOPY WITH PROPOFOL N/A 02/21/2015   Procedure: COLONOSCOPY WITH PROPOFOL;  Surgeon: DLucilla Lame MD;  [place] [facility]  Service: Endoscopy;  Laterality: N/A;  . [place] . POLYPECTOMY  02/21/2015   Procedure: POLYPECTOMY;  Surgeon: DLucilla Lame MD;  Location: [facility];  Service: Endoscopy;;    FAMILY HISTORY: Family History  Problem Relation Age of Onset  . Heart disease Mother   . Stroke Father   . Heart disease Brother   . Kidney cancer Neg Hx   . Prostate cancer Neg Hx   . Bladder Cancer Neg Hx   . Breast cancer Neg Hx     ADVANCED  DIRECTIVES (Y/N):  N  HEALTH MAINTENANCE: Social History   Tobacco Use  . Smoking status: Never Smoker  . Smokeless tobacco: Never Used  Vaping Use  . Vaping Use: Never used  Substance Use Topics  . Alcohol use: No    Alcohol/week: 0.0 standard drinks  . Drug use: No    Colonoscopy:  PAP:  Bone density:  Lipid panel:  Allergies  Allergen Reactions  . Revlimid [Lenalidomide]     Current Outpatient Medications  Medication Sig Dispense Refill  . aspirin EC 81 MG tablet Take 81 mg by mouth daily.    . Cyanocobalamin (B-12 PO) Take 500 mcg by mouth daily.     Marland Kitchen EPINEPHrine 0.3 mg/0.3 mL IJ SOAJ injection     . fexofenadine (ALLEGRA) 30 MG tablet Take 30 mg by mouth daily.     Marland Kitchen levothyroxine (SYNTHROID) 50 MCG tablet TAKE 1 TABLET BY MOUTH DAILY BEFORE BREAKFAST 90 tablet 3  . Multiple Vitamins-Minerals (CENTRUM SILVER PO) Take 1 tablet by mouth daily.    . pomalidomide (POMALYST) 2 MG capsule TAKE 1 CAPSULE BY MOUTH EVERY DAY FOR 21 DAYS THEN OFF FOR 7 DAYS 21 capsule 0  . triamcinolone (NASACORT) 55 MCG/ACT AERO nasal inhaler Place 2 sprays into the nose 2 (two) times daily as needed.    Marland Kitchen UNABLE TO FIND Inject 1 Dose as directed once a week. Allergy injections once a week    . fluconazole (DIFLUCAN) 150 MG tablet Take one tablet PO once and repeat in three days if symptoms persist (Patient not taking: Reported on 07/29/2020) 3 tablet 0   No current facility-administered medications for this visit.    OBJECTIVE: Vitals:   07/29/20 0903  BP: 130/65  Pulse: 71  Resp: 16  Temp: 98.4 F (36.9 C)  SpO2: 100%     Body mass index is 22.63 kg/m.    ECOG FS:0 - Asymptomatic  General: Well-developed, well-nourished, no acute distress.  Appears stated age Eyes: Pink conjunctiva, anicteric sclera. Lungs: Clear to auscultation bilaterally.  No audible wheezing or coughing Heart: Regular rate and rhythm.  Abdomen: Soft, nontender, nondistended.  Musculoskeletal: No edema,  cyanosis, or clubbing. Neuro: Alert, answering all questions appropriately. Cranial nerves grossly intact. Wringing hands during visit.  Skin: No rashes or petechiae noted. Psych: Normal affect.  LAB RESULTS:  Lab Results  Component Value Date   NA 137 07/29/2020   K 4.0 07/29/2020   CL 106 07/29/2020   CO2 22 07/29/2020   GLUCOSE 88 07/29/2020   BUN 25 (H) 07/29/2020   CREATININE 0.99 07/29/2020   CALCIUM 9.0 07/29/2020   PROT 6.1 (L) 07/29/2020   ALBUMIN 3.9 07/29/2020   AST 19 07/29/2020   ALT 14 07/29/2020   ALKPHOS 63 07/29/2020   BILITOT 0.7 07/29/2020   GFRNONAA 56 (L) 07/29/2020   GFRAA 40 (L) 03/01/2020    Lab Results  Component Value Date   WBC 4.1 07/29/2020   NEUTROABS 2.6 07/29/2020   HGB 12.0 07/29/2020   HCT 36.4 07/29/2020   MCV 98.9 07/29/2020   PLT 181 07/29/2020  Lab Results  Component Value Date   TOTALPROTELP 6.0 07/29/2020   ALBUMINELP 3.8 07/29/2020   A1GS 0.2 07/29/2020   A2GS 0.6 07/29/2020   BETS 0.8 07/29/2020   GAMS 0.5 07/29/2020   MSPIKE Not Observed 07/29/2020   SPEI Comment 07/29/2020    STUDIES: No results found.  ASSESSMENT:  Multiple myeloma in relapse.  PLAN:    1.  Multiple myeloma in relapse: Bone marrow biopsy on 04/09/2016 revealed 44% plasma cells and normal cytogenetics.  Metastatic bone survey on 03/12/2016, elevated lambda light chains, hypercalcemia, patient fit criteria for multiple myeloma was treated with single agent Revlimid.  Metastatic bone survey on 02/12/2019 revealed new small lesions in her calvarium.  M spike stable and unchanged previously.  She was unable to tolerate Revlimid and was switched to pomalidomide 2 mg daily for 21 days with a 7-day break.  Today's IgG, IgM, and IgA uptrending though still somewhat decreased.  Lambda light chains downtrending and now 1440.7. M spike not observed. Labs today reviewed and acceptable to proceed with cycle 12 of pomalidomide, however recommend holding treatment x 1  week given worsening neuropathy (see below). Unclear if treatment related. On low dose pomalyst so no room to dose reduce further. Continue dose reduced zometa for history of hypercalcemia and bone disease. Proceed with zometa today. Return to clinic in 1 week fo re-evaluation and consideration of restarting treatment.  2.  Chronic renal insufficiency: stable. Baseline Cr around 1.0. 0.99 today.  3.  Anemia: Chronic and unchanged.  Hemoglobin 12.0 today. 4.  Leukopenia: Resolved. 5.  Peripheral neuropathy: Worse. Hx of chronic PN s/p acupuncture w/o improvement. Now interfering with ADLs. Will refer to Rosalyn Gess for balance. Start gabapentin 300 mg BID prn for neuropathy.  6.  Constipation: does not complain of this today. continue otc medications. Monitor.  7. Hypercalcemia: resolved. Ca 8.8 today. Proceed with zometa today.   Patient expressed understanding and was in agreement with this plan. She also understands that She can call clinic at any time with any questions, concerns, or complaints.   Beckey Rutter, DNP, AGNP-C [place] [facility] [phone] (clinic)

## 2020-07-30 LAB — IGG, IGA, IGM
IgA: 43 mg/dL — ABNORMAL LOW (ref 64–422)
IgG (Immunoglobin G), Serum: 518 mg/dL — ABNORMAL LOW (ref 586–1602)
IgM (Immunoglobulin M), Srm: 19 mg/dL — ABNORMAL LOW (ref 26–217)

## 2020-08-01 LAB — PROTEIN ELECTROPHORESIS, SERUM
A/G Ratio: 1.7 (ref 0.7–1.7)
Albumin ELP: 3.8 g/dL (ref 2.9–4.4)
Alpha-1-Globulin: 0.2 g/dL (ref 0.0–0.4)
Alpha-2-Globulin: 0.6 g/dL (ref 0.4–1.0)
Beta Globulin: 0.8 g/dL (ref 0.7–1.3)
Gamma Globulin: 0.5 g/dL (ref 0.4–1.8)
Globulin, Total: 2.2 g/dL (ref 2.2–3.9)
Total Protein ELP: 6 g/dL (ref 6.0–8.5)

## 2020-08-01 LAB — KAPPA/LAMBDA LIGHT CHAINS
Kappa free light chain: 19.5 mg/L — ABNORMAL HIGH (ref 3.3–19.4)
Kappa free light chain: 21.6 mg/L — ABNORMAL HIGH (ref 3.3–19.4)
Kappa, lambda light chain ratio: 0.02 — ABNORMAL LOW (ref 0.26–1.65)
Kappa, lambda light chain ratio: 0.01 — ABNORMAL LOW (ref 0.26–1.65)
Lambda free light chains: 1204.6 mg/L — ABNORMAL HIGH (ref 5.7–26.3)
Lambda free light chains: 1440.7 mg/L — ABNORMAL HIGH (ref 5.7–26.3)

## 2020-08-02 ENCOUNTER — Telehealth: Payer: Self-pay | Admitting: *Deleted

## 2020-08-02 NOTE — Telephone Encounter (Signed)
[employer] called asking how long the patients Pomalyst is on hold for. I see no documentation stating that it is on hold and it was just refilled on 07/25/20. Please advise

## 2020-08-03 NOTE — Telephone Encounter (Signed)
Per the Pharmacy, the patient told them on 08/01/20 that the doctor was putting her Pomalyst on hold because of her peripheral neuropathy. Lauren, your note form 2/18 is incomplete so I am not sure if you took her off drug or not. Please advise

## 2020-08-03 NOTE — Telephone Encounter (Signed)
Per Beckey Rutter, NP, she did put the patient's Pomalyst on hold for an undetermined amt of time depending on her neuropathy and will evaluate week by week whether to restart it. Pharmacy notified and they will call us back next week to see if it needs to be filled as she has an appointment tomorrow

## 2020-08-03 NOTE — Telephone Encounter (Signed)
She is not on hold.  Not sure what they are talking about.

## 2020-08-05 ENCOUNTER — Inpatient Hospital Stay (HOSPITAL_BASED_OUTPATIENT_CLINIC_OR_DEPARTMENT_OTHER): Payer: Medicare PPO | Admitting: Nurse Practitioner

## 2020-08-05 VITALS — BP 113/59 | HR 77 | Temp 97.8°F | Resp 18

## 2020-08-05 DIAGNOSIS — C9002 Multiple myeloma in relapse: Secondary | ICD-10-CM | POA: Diagnosis not present

## 2020-08-05 DIAGNOSIS — C9000 Multiple myeloma not having achieved remission: Secondary | ICD-10-CM | POA: Diagnosis not present

## 2020-08-10 ENCOUNTER — Inpatient Hospital Stay: Payer: Medicare PPO | Attending: Oncology | Admitting: Occupational Therapy

## 2020-08-10 DIAGNOSIS — R239 Unspecified skin changes: Secondary | ICD-10-CM

## 2020-08-10 NOTE — Therapy (Signed)
[facility] [address] [place], [place], [place], [address] Phone: [phone]   Fax:  [phone]  Occupational Therapy Screen  Patient Details  Name: Rhonda [name] MRN: [id number] Date of Birth: [birth year] No data recorded  Encounter Date: 08/10/2020   OT End of Session - 08/10/20 1539    Visit Number 0           Past Medical History:  Diagnosis Date  . Actinic keratosis   . Arthritis    hands  . Benign neoplasm of ascending colon   . Cataract   . GERD (gastroesophageal reflux disease)   . Hyperlipemia   . Hypertension 01/31/2015  . Hypothyroidism   . Melanoma ([place]) 04/28/2018   R mid to distal ant lat thigh. MM, SS, tumor thickness 0.19m, antatomic level II. Excised: 05/13/18, margins free  . Multiple myeloma ([place] 04/18/2016  . Multiple myeloma in remission ([facility] 04/18/2016  . Osteoporosis   . Shingles   . Skin cancer    basal cell  . Squamous cell carcinoma of skin 02/04/2014   Left pretibial. KA-like pattern  . Squamous cell carcinoma of skin 09/15/2015   Right lat. superior ankle are. Superficial infiltration. Tx: EDC  . Squamous cell carcinoma of skin 10/15/2016   Left medial mid lower leg. Tx: EDC  . Squamous cell carcinoma of skin 11/27/2018   Right dorsum proximal forearm.  . Squamous cell carcinoma of skin 09/09/2019   Right cheek lat. to mid nasolabial area. Tx: EDC  . Squamous cell carcinoma of skin 12/09/2019   right pretibial below knee    Past Surgical History:  Procedure Laterality Date  . BREAST EXCISIONAL BIOPSY Right 15+ yrs ago   EXCISIONAL - NEG  . CATARACT EXTRACTION W/ INTRAOCULAR LENS IMPLANT    . CHOLECYSTECTOMY N/A 03/14/2017   Procedure: LAPAROSCOPIC CHOLECYSTECTOMY;  Surgeon: CFlorene Glen MD;  Location: [facility];  Service: General;  Laterality: N/A;  . COLONOSCOPY WITH PROPOFOL N/A 02/21/2015   Procedure: COLONOSCOPY WITH PROPOFOL;  Surgeon: DLucilla Lame MD;   [place] [facility]  Service: Endoscopy;  Laterality: N/A;  . [place] . POLYPECTOMY  02/21/2015   Procedure: POLYPECTOMY;  Surgeon: DLucilla Lame MD;  [place] [facility]  Service: Endoscopy;;    There were no vitals filed for this visit.   Subjective Assessment - 08/10/20 1537    Subjective  My neuropathy worse end of day -and more burning feeling in my feet and feel swollen , and finger tips numb - I am taking the gabapentin only night time - it makes me groggy and do not want to fall    Currently in Pain? Yes    Pain Score 3     Pain Location Foot    Pain Orientation Right;Left    Pain Descriptors / Indicators Burning    Pain Type Chronic pain             NOTO FROM LBeckey RutterNP 07/29/20:    Multiple myeloma in relapse: Bone marrow biopsy on 04/09/2016 revealed 44% plasma cells and normal cytogenetics.  Metastatic bone survey on 03/12/2016, elevated lambda light chains, hypercalcemia, patient fit criteria for multiple myeloma was treated with single agent Revlimid.  Metastatic bone survey on 02/12/2019 revealed new small lesions in her calvarium.  M spike stable and unchanged previously.  She was unable to tolerate Revlimid and was switched to pomalidomide 2 mg daily for 21 days with a  7-day break.  Today's IgG, IgM, and IgA uptrending though still somewhat decreased.  Lambda light chains downtrending and now 1440.7. M spike not observed. Labs today reviewed and acceptable to proceed with cycle 12 of pomalidomide, however recommend holding treatment x 1 week given worsening neuropathy (see below). Unclear if treatment related. On low dose pomalyst so no room to dose reduce further. Continue dose reduced zometa for history of hypercalcemia and bone disease. Proceed with zometa today. Return to clinic in 1 week fo re-evaluation and consideration of restarting treatment.  2.  Chronic renal insufficiency: stable. Baseline Cr around 1.0. 0.99 today.   3.  Anemia: Chronic and unchanged.  Hemoglobin 12.0 today. 4.  Leukopenia: Resolved. 5.  Peripheral neuropathy: Worse. Hx of chronic PN s/p acupuncture w/o improvement. Now interfering with ADLs. Will refer to Rosalyn Gess for balance. Start gabapentin 300 mg BID prn for neuropathy.  6.  Constipation: does not complain of this today. continue otc medications. Monitor.  7. Hypercalcemia: resolved. Ca 8.8 today. Proceed with zometa today.   OT SCREEN 08/10/20:  Pt live alone - independent - walk daily 1 or 2 x with dogs or daughter - about 30-45 min  She plays senior soft ball - and couch have them do some warm up exercises before they play  - no issues holding bat  Do not play pickle ball anymore No fall -  Pt has tub shower combo and has railing -stand up and shower Wear good shoes during day - but report wearing Crocs at home - recommend another type of shoe that is more supportive and good traction for home use Ed pt on cont being active and exercise - decrease symptoms  Watch when walking on uneven surfaces or when she fatigue, compensate with vision Discuss ointments - pt do use in the evening CBD oil that her daughter gave her - that helps for symptoms before bed time Review fall prevention for home and home safety  Neuropathy in hands - hard time with buttons, writing ,jewelry and opening jars  Ed on AE for ease- buttons hook, Penagain, enlarge grip on pens, magnets for jewelry , jar openers, electric can opener  Review HEP for tendon glides -focus intrinsic fist and opposition - making 0 -  To maintain precision pinch  Do in shower or when hands are warm  Pt to cont at home - can contact me if needed                               Patient will benefit from skilled therapeutic intervention in order to improve the following deficits and impairments:           Visit Diagnosis: Skin changes related to chemotherapy    Problem List Patient Active  Problem List   Diagnosis Date Noted  . Fever and chills 06/18/2018  . Acute upper respiratory infection 06/18/2018  . Acute non-recurrent maxillary sinusitis 02/12/2018  . Allergic rhinitis due to allergen 02/12/2018  . Infectious diarrhea 10/09/2017  . Calculus of gallbladder without cholecystitis without obstruction   . Multiple myeloma ([place]) 04/18/2016  . Hypercalcemia 02/28/2016  . Abnormal findings-gastrointestinal tract   . Benign neoplasm of ascending colon   . GERD (gastroesophageal reflux disease) 01/31/2015  . Arthritis 01/31/2015  . Essential hypertension 01/31/2015  . Hyperlipidemia 01/31/2015  . Osteoporosis 01/31/2015  . Hypothyroidism 01/31/2015    Rosalyn Gess OTR/l,CLT 08/10/2020, 3:40 PM  [facility]  [facility] [address] [place], [place], [address] Phone: [phone]   Fax:  [phone]  Name: Rhonda [name] MRN: [id number] Date of Birth: [birth year]

## 2020-08-12 DIAGNOSIS — J301 Allergic rhinitis due to pollen: Secondary | ICD-10-CM | POA: Diagnosis not present

## 2020-08-19 DIAGNOSIS — J301 Allergic rhinitis due to pollen: Secondary | ICD-10-CM | POA: Diagnosis not present

## 2020-08-24 DIAGNOSIS — J301 Allergic rhinitis due to pollen: Secondary | ICD-10-CM | POA: Diagnosis not present

## 2020-08-25 ENCOUNTER — Other Ambulatory Visit: Payer: Self-pay | Admitting: Oncology

## 2020-08-25 DIAGNOSIS — C9000 Multiple myeloma not having achieved remission: Secondary | ICD-10-CM

## 2020-08-26 ENCOUNTER — Other Ambulatory Visit: Payer: Self-pay | Admitting: *Deleted

## 2020-08-26 DIAGNOSIS — C9000 Multiple myeloma not having achieved remission: Secondary | ICD-10-CM

## 2020-08-26 DIAGNOSIS — J301 Allergic rhinitis due to pollen: Secondary | ICD-10-CM | POA: Diagnosis not present

## 2020-08-26 MED ORDER — POMALIDOMIDE 2 MG PO CAPS: ORAL_CAPSULE | ORAL | 0 refills | Status: DC

## 2020-09-02 DIAGNOSIS — J301 Allergic rhinitis due to pollen: Secondary | ICD-10-CM | POA: Diagnosis not present

## 2020-09-08 DIAGNOSIS — H40153 Residual stage of open-angle glaucoma, bilateral: Secondary | ICD-10-CM | POA: Diagnosis not present

## 2020-09-09 ENCOUNTER — Encounter: Payer: Self-pay | Admitting: Oncology

## 2020-09-09 ENCOUNTER — Other Ambulatory Visit: Payer: Self-pay

## 2020-09-09 ENCOUNTER — Inpatient Hospital Stay (HOSPITAL_BASED_OUTPATIENT_CLINIC_OR_DEPARTMENT_OTHER): Payer: Medicare PPO | Admitting: Oncology

## 2020-09-09 ENCOUNTER — Inpatient Hospital Stay: Payer: Medicare PPO

## 2020-09-09 ENCOUNTER — Inpatient Hospital Stay: Payer: Medicare PPO | Attending: Oncology

## 2020-09-09 VITALS — BP 128/69 | HR 62 | Temp 97.7°F | Resp 20 | Wt 124.0 lb

## 2020-09-09 DIAGNOSIS — C9000 Multiple myeloma not having achieved remission: Secondary | ICD-10-CM | POA: Diagnosis not present

## 2020-09-09 DIAGNOSIS — N189 Chronic kidney disease, unspecified: Secondary | ICD-10-CM | POA: Diagnosis not present

## 2020-09-09 DIAGNOSIS — K5900 Constipation, unspecified: Secondary | ICD-10-CM | POA: Diagnosis not present

## 2020-09-09 DIAGNOSIS — I129 Hypertensive chronic kidney disease with stage 1 through stage 4 chronic kidney disease, or unspecified chronic kidney disease: Secondary | ICD-10-CM | POA: Insufficient documentation

## 2020-09-09 DIAGNOSIS — Z85820 Personal history of malignant melanoma of skin: Secondary | ICD-10-CM | POA: Diagnosis not present

## 2020-09-09 DIAGNOSIS — C9002 Multiple myeloma in relapse: Secondary | ICD-10-CM | POA: Diagnosis not present

## 2020-09-09 DIAGNOSIS — D649 Anemia, unspecified: Secondary | ICD-10-CM | POA: Diagnosis not present

## 2020-09-09 DIAGNOSIS — G629 Polyneuropathy, unspecified: Secondary | ICD-10-CM | POA: Diagnosis not present

## 2020-09-09 LAB — CBC WITH DIFFERENTIAL/PLATELET
Abs Immature Granulocytes: 0.02 10*3/uL (ref 0.00–0.07)
Basophils Absolute: 0.1 10*3/uL (ref 0.0–0.1)
Basophils Relative: 3 %
Eosinophils Absolute: 0.1 10*3/uL (ref 0.0–0.5)
Eosinophils Relative: 3 %
HCT: 35.5 % — ABNORMAL LOW (ref 36.0–46.0)
Hemoglobin: 12.1 g/dL (ref 12.0–15.0)
Immature Granulocytes: 1 %
Lymphocytes Relative: 31 %
Lymphs Abs: 1 10*3/uL (ref 0.7–4.0)
MCH: 33.3 pg (ref 26.0–34.0)
MCHC: 34.1 g/dL (ref 30.0–36.0)
MCV: 97.8 fL (ref 80.0–100.0)
Monocytes Absolute: 0.4 10*3/uL (ref 0.1–1.0)
Monocytes Relative: 13 %
Neutro Abs: 1.6 10*3/uL — ABNORMAL LOW (ref 1.7–7.7)
Neutrophils Relative %: 49 %
Platelets: 193 10*3/uL (ref 150–400)
RBC: 3.63 MIL/uL — ABNORMAL LOW (ref 3.87–5.11)
RDW: 13.9 % (ref 11.5–15.5)
WBC: 3.3 10*3/uL — ABNORMAL LOW (ref 4.0–10.5)
nRBC: 0 % (ref 0.0–0.2)

## 2020-09-09 LAB — COMPREHENSIVE METABOLIC PANEL
ALT: 13 U/L (ref 0–44)
AST: 18 U/L (ref 15–41)
Albumin: 4 g/dL (ref 3.5–5.0)
Alkaline Phosphatase: 89 U/L (ref 38–126)
Anion gap: 7 (ref 5–15)
BUN: 25 mg/dL — ABNORMAL HIGH (ref 8–23)
CO2: 25 mmol/L (ref 22–32)
Calcium: 9.2 mg/dL (ref 8.9–10.3)
Chloride: 106 mmol/L (ref 98–111)
Creatinine, Ser: 0.95 mg/dL (ref 0.44–1.00)
GFR, Estimated: 59 mL/min — ABNORMAL LOW (ref >60)
Glucose, Bld: 92 mg/dL (ref 70–99)
Potassium: 4.2 mmol/L (ref 3.5–5.1)
Sodium: 138 mmol/L (ref 135–145)
Total Bilirubin: 0.6 mg/dL (ref 0.3–1.2)
Total Protein: 6.4 g/dL — ABNORMAL LOW (ref 6.5–8.1)

## 2020-09-09 MED ORDER — MUPIROCIN 2 % EX OINT: TOPICAL_OINTMENT | Freq: Two times a day (BID) | CUTANEOUS | 0 refills | Status: DC

## 2020-09-09 MED ORDER — DULOXETINE HCL 30 MG PO CPEP: 30.0000 mg | ORAL_CAPSULE | Freq: Every day | ORAL | 3 refills | Status: DC

## 2020-09-09 MED ORDER — SODIUM CHLORIDE 0.9 % IV SOLN
Freq: Once | INTRAVENOUS | Status: AC
  Filled 2020-09-09: qty 250

## 2020-09-09 MED ORDER — ZOLEDRONIC ACID 4 MG/5ML IV CONC
3.0000 mg | Freq: Once | INTRAVENOUS | Status: AC
  Administered 2020-09-09: 3 mg via INTRAVENOUS
  Filled 2020-09-09: qty 3.75

## 2020-09-09 NOTE — Progress Notes (Signed)
Patient denies any concerns today.  

## 2020-09-09 NOTE — Progress Notes (Signed)
[place]  Telephone:(336) [phone] Fax:(336) [phone]  ID: [name] OB: 11/03/1933  MR#: [id number]  MLY#:[id number]  Patient Care Team: Lavera Guise, MD as PCP - General (Internal Medicine) Lloyd Huger, MD as Consulting Physician (Oncology)   CHIEF COMPLAINT: Multiple myeloma in relapse  INTERVAL HISTORY: Patient returns to clinic today for further evaluation and consideration of Zometa and cycle 13 of Pomalyst.  She was last seen in clinic on 07/29/2020.  She expressed concern over worsening peripheral neuropathy.  Her Pomalyst was placed on hold for 1 week.  Today, she reports stable peripheral neuropathy.  She is currently 1 week and to cycle 13 of Pomalyst.  Endorses a new "spot" on her left arm that came up when she began cycle 13 of Pomalyst.  Area is tender to touch.  She has tried OTC antibiotic creams without relief of her symptoms.  States she had an area similar on her leg and was seen by dermatology.  She denies any neurological changes, recent falls, chest pain, shortness of breath, abdominal pain, constipation or diarrhea.  REVIEW OF SYSTEMS:   Review of Systems  Constitutional: Negative for fever, malaise/fatigue and weight loss.  HENT: Negative for congestion and sore throat.   Respiratory: Negative.  Negative for cough and shortness of breath.   Cardiovascular: Negative.  Negative for chest pain, palpitations and leg swelling.  Gastrointestinal: Negative.  Negative for abdominal pain, blood in stool, constipation, diarrhea, melena, nausea and vomiting.  Genitourinary: Negative.  Negative for dysuria.  Musculoskeletal: Negative.  Negative for back pain.  Skin: Negative.  Negative for rash.  Neurological: Positive for tingling and sensory change. Negative for focal weakness and weakness.  Psychiatric/Behavioral: Negative.  The patient is not nervous/anxious and does not have insomnia.   As per HPI. Otherwise, a complete review of  systems is negative.  PAST MEDICAL HISTORY: Past Medical History:  Diagnosis Date  . Actinic keratosis   . Arthritis    hands  . Benign neoplasm of ascending colon   . Cataract   . GERD (gastroesophageal reflux disease)   . Hyperlipemia   . Hypertension 01/31/2015  . Hypothyroidism   . Melanoma ([place]) 04/28/2018   R mid to distal ant lat thigh. MM, SS, tumor thickness 0.65m, antatomic level II. Excised: 05/13/18, margins free  . [place]  . [place] 04/18[place]  . Osteoporosis   . Shingles   . Skin cancer    basal cell  . Squamous cell carcinoma of skin 02/04/2014   Left pretibial. KA-like pattern  . Squamous cell carcinoma of skin 09/15/2015   Right lat. superior ankle are. Superficial infiltration. Tx: EDC  . Squamous cell carcinoma of skin 10/15/2016   Left medial mid lower leg. Tx: EDC  . Squamous cell carcinoma of skin 11/27/2018   Right dorsum proximal forearm.  . Squamous cell carcinoma of skin 09/09/2019   Right cheek lat. to mid nasolabial area. Tx: EDC  . Squamous cell carcinoma of skin 12/09/2019   right pretibial below knee    PAST SURGICAL HISTORY: Past Surgical History:  Procedure Laterality Date  . BREAST EXCISIONAL BIOPSY Right 15+ yrs ago   EXCISIONAL - NEG  . CATARACT EXTRACTION W/ INTRAOCULAR LENS IMPLANT    . CHOLECYSTECTOMY N/A 03/14/2017   Procedure: LAPAROSCOPIC CHOLECYSTECTOMY;  Surgeon: CFlorene Glen MD;  Location: [facility];  Service: General;  Laterality: N/A;  . COLONOSCOPY WITH PROPOFOL N/A 02/21/2015   Procedure: COLONOSCOPY  WITH PROPOFOL;  Surgeon: Lucilla Lame, MD;  Location: [place];  Service: Endoscopy;  Laterality: N/A;  . [place]  . POLYPECTOMY  02/21/2015   Procedure: POLYPECTOMY;  Surgeon: Lucilla Lame, MD;  Location: [place];  Service: Endoscopy;;    FAMILY HISTORY: Family History  Problem Relation Age of Onset  . Heart disease  Mother   . Stroke Father   . Heart disease Brother   . Kidney cancer Neg Hx   . Prostate cancer Neg Hx   . Bladder Cancer Neg Hx   . Breast cancer Neg Hx     ADVANCED DIRECTIVES (Y/N):  N  HEALTH MAINTENANCE: Social History   Tobacco Use  . Smoking status: Never Smoker  . Smokeless tobacco: Never Used  Vaping Use  . Vaping Use: Never used  Substance Use Topics  . Alcohol use: No    Alcohol/week: 0.0 standard drinks  . Drug use: No    Colonoscopy:  PAP:  Bone density:  Lipid panel:  Allergies  Allergen Reactions  . Revlimid [Lenalidomide]     Current Outpatient Medications  Medication Sig Dispense Refill  . aspirin EC 81 MG tablet Take 81 mg by mouth daily.    . Cyanocobalamin (B-12 PO) Take 500 mcg by mouth daily.     Marland Kitchen EPINEPHrine 0.3 mg/0.3 mL IJ SOAJ injection     . fexofenadine (ALLEGRA) 30 MG tablet Take 30 mg by mouth daily.     . fluconazole (DIFLUCAN) 150 MG tablet Take one tablet PO once and repeat in three days if symptoms persist (Patient not taking: Reported on 08/05/2020) 3 tablet 0  . gabapentin (NEURONTIN) 300 MG capsule Take 1 capsule (300 mg total) by mouth 2 (two) times daily as needed (numbness & tingling). 30 capsule 2  . levothyroxine (SYNTHROID) 50 MCG tablet TAKE 1 TABLET BY MOUTH DAILY BEFORE BREAKFAST 90 tablet 3  . Multiple Vitamins-Minerals (CENTRUM SILVER PO) Take 1 tablet by mouth daily.    . pomalidomide (POMALYST) 2 MG capsule TAKE 1 CAPSULE BY MOUTH EVERY DAY FOR 21 DAYS FOLLOWED BY 7 DAYS OFF. 21 capsule 0  . triamcinolone (NASACORT) 55 MCG/ACT AERO nasal inhaler Place 2 sprays into the nose 2 (two) times daily as needed.    Marland Kitchen UNABLE TO FIND Inject 1 Dose as directed once a week. Allergy injections once a week     No current facility-administered medications for this visit.    OBJECTIVE: There were no vitals filed for this visit.   There is no height or weight on file to calculate BMI.    ECOG FS:0 - Asymptomatic  Physical  Exam Constitutional:      Appearance: Normal appearance.  HENT:     Head: Normocephalic and atraumatic.  Eyes:     Pupils: Pupils are equal, round, and reactive to light.  Cardiovascular:     Rate and Rhythm: Normal rate and regular rhythm.     Heart sounds: Normal heart sounds. No murmur heard.   Pulmonary:     Effort: Pulmonary effort is normal.     Breath sounds: Normal breath sounds. No wheezing.  Abdominal:     General: Bowel sounds are normal. There is no distension.     Palpations: Abdomen is soft.     Tenderness: There is no abdominal tenderness.  Musculoskeletal:        General: Normal range of motion.     Cervical back: Normal range of motion.  Skin:  General: Skin is warm and dry.     Findings: No rash.  Neurological:     Mental Status: She is alert and oriented to person, place, and time.  Psychiatric:        Judgment: Judgment normal.      LAB RESULTS:  Lab Results  Component Value Date   NA 137 07/29/2020   K 4.0 07/29/2020   CL 106 07/29/2020   CO2 22 07/29/2020   GLUCOSE 88 07/29/2020   BUN 25 (H) 07/29/2020   CREATININE 0.99 07/29/2020   CALCIUM 9.0 07/29/2020   PROT 6.1 (L) 07/29/2020   ALBUMIN 3.9 07/29/2020   AST 19 07/29/2020   ALT 14 07/29/2020   ALKPHOS 63 07/29/2020   BILITOT 0.7 07/29/2020   GFRNONAA 56 (L) 07/29/2020   GFRAA 40 (L) 03/01/2020    Lab Results  Component Value Date   WBC 4.1 07/29/2020   NEUTROABS 2.6 07/29/2020   HGB 12.0 07/29/2020   HCT 36.4 07/29/2020   MCV 98.9 07/29/2020   PLT 181 07/29/2020   Lab Results  Component Value Date   TOTALPROTELP 6.0 07/29/2020   ALBUMINELP 3.8 07/29/2020   A1GS 0.2 07/29/2020   A2GS 0.6 07/29/2020   BETS 0.8 07/29/2020   GAMS 0.5 07/29/2020   MSPIKE Not Observed 07/29/2020   SPEI Comment 07/29/2020    STUDIES: No results found.  ASSESSMENT:  Multiple myeloma in relapse.  PLAN:    1.  Multiple myeloma in relapse:  -Patient had bone marrow biopsy on  04/09/2016 revealing 44% plasma cells and normal cytogenetics. -Metastatic bone survey on 03/12/2016 revealed elevated lambda light chains, hypercalcemia dating criteria for treatment for multiple myeloma. -She was started on Revlimid. -Repeat metastatic bone survey on 02/12/2019 revealed new small lesions in her calvarium. -M spike stable and unchanged. -She was unable to tolerate Revlimid and was switched to pomalidomide for 21 days with a 7-day break.   -She started Cycle 13 Pomalyst last Friday; has 2 additional weeks on Pomalyst before her 1 week off. -Labs from 09/09/2020 show a hemoglobin of 12.1, platelets 193, WBC 3.3 with ANC of 1.6.  Myeloma labs are pending during dictation.  2.  CKD: -Chronic but stable. -Labs from 09/09/2020 show a creatinine of 0.95 and BUN 25.  3.  Peripheral neuropathy: -Has worsened over the past several months. -Appears to be affecting her ADLs. -She was started on gabapentin 300 mg twice daily for neuropathy. -She was unable to tolerate BID dosing and only is taking it once at bedtime.  She has not seen a significant change in her peripheral neuropathy. -Recommend she stop gabapentin and can trial 30 mg Cymbalta at bedtime. -She was referred to Rosalyn Gess and occupational therapy for balance concerns.  4.  Hypercalcemia: -Labs from 07/29/2020 show a calcium level of 9.0. -Proceed with Zometa.  5.  Rash on left arm: -Area is red and tender to touch. -Has tried OTC antibiotic creams without relief. -According to up-to-date, 21% of patients experience skin rash and itching.  -Recommend mupirocin topical ointment.  Prescription sent to pharmacy.  Disposition: -We will touch base with her in approximately 2 weeks to see how she is tolerating Cymbalta. -Stop gabapentin. -Start mupirocin ointment. -RTC in 4 weeks with repeat labs, MD assessment prior to cycle 14.  Greater than 50% was spent in counseling and coordination of care with this patient  including but not limited to discussion of the relevant topics above (See A&P) including, but not limited to diagnosis  and management of acute and chronic medical conditions.   Patient expressed understanding and was in agreement with this plan. She also understands that She can call clinic at any time with any questions, concerns, or complaints.   Faythe Casa, NP 09/09/2020 8:22 AM  [place] at [facility] [phone] (clinic)

## 2020-09-10 LAB — IGG, IGA, IGM
IgA: 37 mg/dL — ABNORMAL LOW (ref 64–422)
IgG (Immunoglobin G), Serum: 499 mg/dL — ABNORMAL LOW (ref 586–1602)
IgM (Immunoglobulin M), Srm: 10 mg/dL — ABNORMAL LOW (ref 26–217)

## 2020-09-12 LAB — PROTEIN ELECTROPHORESIS, SERUM
A/G Ratio: 1.7 (ref 0.7–1.7)
Albumin ELP: 3.8 g/dL (ref 2.9–4.4)
Alpha-1-Globulin: 0.2 g/dL (ref 0.0–0.4)
Alpha-2-Globulin: 0.6 g/dL (ref 0.4–1.0)
Beta Globulin: 0.8 g/dL (ref 0.7–1.3)
Gamma Globulin: 0.5 g/dL (ref 0.4–1.8)
Globulin, Total: 2.2 g/dL (ref 2.2–3.9)
M-Spike, %: 0.1 g/dL — ABNORMAL HIGH
Total Protein ELP: 6 g/dL (ref 6.0–8.5)

## 2020-09-12 LAB — KAPPA/LAMBDA LIGHT CHAINS
Kappa free light chain: 20.7 mg/L — ABNORMAL HIGH (ref 3.3–19.4)
Kappa, lambda light chain ratio: 0.02 — ABNORMAL LOW (ref 0.26–1.65)
Lambda free light chains: 1202.3 mg/L — ABNORMAL HIGH (ref 5.7–26.3)

## 2020-09-15 ENCOUNTER — Other Ambulatory Visit: Payer: Self-pay | Admitting: Oncology

## 2020-09-15 DIAGNOSIS — C9000 Multiple myeloma not having achieved remission: Secondary | ICD-10-CM

## 2020-09-16 DIAGNOSIS — J301 Allergic rhinitis due to pollen: Secondary | ICD-10-CM | POA: Diagnosis not present

## 2020-09-21 ENCOUNTER — Telehealth: Payer: Self-pay | Admitting: *Deleted

## 2020-09-21 ENCOUNTER — Other Ambulatory Visit: Payer: Self-pay

## 2020-09-21 DIAGNOSIS — C9000 Multiple myeloma not having achieved remission: Secondary | ICD-10-CM

## 2020-09-21 MED ORDER — POMALIDOMIDE 2 MG PO CAPS: 2.0000 mg | ORAL_CAPSULE | Freq: Every day | ORAL | 0 refills | Status: DC

## 2020-09-21 NOTE — Telephone Encounter (Signed)
Pomalyst prescription sent on 4/8 needs a [employer] auth #, please obtain and resubmit prescription

## 2020-09-21 NOTE — Telephone Encounter (Signed)
Rx recent to pharmacy with  [employer] auth #

## 2020-09-26 NOTE — Progress Notes (Signed)
[name]  Telephone:(336) [phone] Fax:(336) [phone]  ID: Rhonda [name] OB: 1934-05-13  MR#: [id number]  PXT#:[id number]  Patient Care Team: Rhonda Guise, MD as PCP - General (Internal Medicine) Rhonda Huger, MD as Consulting Physician (Oncology)   CHIEF COMPLAINT: Multiple myeloma in relapse   INTERVAL HISTORY: Patient returns to clinic today for further evaluation and consideration of Zometa and pomalidomide. At visit last week she had complained of worsening peripheral neuropathy and pomalyst was held for one week. Today, symptoms have improved but not resolved. No tripping, dropping, or falls. No dizziness or weakness. No fevers or chills. No chest [name], shortness of breath, cough, or hemoptysis. No nausea, vomiting, constipation or diarrhea. No melena or hematochezia. No urinary complaints. No further specific complaints today.   REVIEW OF SYSTEMS:   Review of Systems  Constitutional: Negative for fever, malaise/fatigue and weight loss.  HENT: Negative for congestion and sore throat.   Respiratory: Negative.  Negative for cough and shortness of breath.   Cardiovascular: Negative.  Negative for chest [name], palpitations and leg swelling.  Gastrointestinal: Negative.  Negative for abdominal [name], blood in stool, constipation, diarrhea, melena, nausea and vomiting.  Genitourinary: Negative.  Negative for dysuria.  Musculoskeletal: Negative.  Negative for back [name].  Skin: Negative.  Negative for rash.  Neurological: Positive for tingling and sensory change. Negative for focal weakness and weakness.  Psychiatric/Behavioral: Negative.  The patient is not nervous/anxious and does not have insomnia.   As per HPI. Otherwise, a complete review of systems is negative.  PAST MEDICAL HISTORY: Past Medical History:  Diagnosis Date  . Actinic keratosis   . Arthritis    hands  . Benign neoplasm of ascending colon   . Cataract   . GERD (gastroesophageal  reflux disease)   . Hyperlipemia   . Hypertension 01/31/2015  . Hypothyroidism   . Melanoma ([place]) 04/28/2018   R mid to distal ant lat thigh. MM, SS, tumor thickness 0.60m, antatomic level II. Excised: 05/13/18, margins free  . [place] 04[place]  . [place] 04[place]  . Osteoporosis   . Shingles   . Skin cancer    basal cell  . Squamous cell carcinoma of skin 02/04/2014   Left pretibial. KA-like pattern  . Squamous cell carcinoma of skin 09/15/2015   Right lat. superior ankle are. Superficial infiltration. Tx: EDC  . Squamous cell carcinoma of skin 10/15/2016   Left medial mid lower leg. Tx: EDC  . Squamous cell carcinoma of skin 11/27/2018   Right dorsum proximal forearm.  . Squamous cell carcinoma of skin 09/09/2019   Right cheek lat. to mid nasolabial area. Tx: EDC  . Squamous cell carcinoma of skin 12/09/2019   right pretibial below knee    PAST SURGICAL HISTORY: Past Surgical History:  Procedure Laterality Date  . BREAST EXCISIONAL BIOPSY Right 15+ yrs ago   EXCISIONAL - NEG  . CATARACT EXTRACTION W/ INTRAOCULAR LENS IMPLANT    . CHOLECYSTECTOMY N/A 03/14/2017   Procedure: LAPAROSCOPIC CHOLECYSTECTOMY;  Surgeon: CFlorene Glen MD;  Location: [facility];  Service: General;  Laterality: N/A;  . COLONOSCOPY WITH PROPOFOL N/A 02/21/2015   Procedure: COLONOSCOPY WITH PROPOFOL;  Surgeon: DLucilla Lame MD;  [place]  Service: Endoscopy;  Laterality: N/A;  . [place] POLYPECTOMY  02/21/2015   Procedure: POLYPECTOMY;  Surgeon: DLucilla Lame MD;  [place]  Service: Endoscopy;;    FAMILY HISTORY: Family  History  Problem Relation Age of Onset  . Heart disease Mother   . Stroke Father   . Heart disease Brother   . Kidney cancer Neg Hx   . Prostate cancer Neg Hx   . Bladder Cancer Neg Hx   . Breast cancer Neg Hx     ADVANCED DIRECTIVES (Y/N):  N  HEALTH  MAINTENANCE: Social History   Tobacco Use  . Smoking status: Never Smoker  . Smokeless tobacco: Never Used  Vaping Use  . Vaping Use: Never used  Substance Use Topics  . Alcohol use: No    Alcohol/week: 0.0 standard drinks  . Drug use: No    Colonoscopy:  PAP:  Bone density:  Lipid panel:  Allergies  Allergen Reactions  . Revlimid [Lenalidomide]     Current Outpatient Medications  Medication Sig Dispense Refill  . aspirin EC 81 MG tablet Take 81 mg by mouth daily.    . Cyanocobalamin (B-12 PO) Take 500 mcg by mouth daily.     Marland Kitchen EPINEPHrine 0.3 mg/0.3 mL IJ SOAJ injection     . fexofenadine (ALLEGRA) 30 MG tablet Take 30 mg by mouth daily.     Marland Kitchen gabapentin (NEURONTIN) 300 MG capsule Take 1 capsule (300 mg total) by mouth 2 (two) times daily as needed (numbness & tingling). 30 capsule 2  . levothyroxine (SYNTHROID) 50 MCG tablet TAKE 1 TABLET BY MOUTH DAILY BEFORE BREAKFAST 90 tablet 3  . Multiple Vitamins-Minerals (CENTRUM SILVER PO) Take 1 tablet by mouth daily.    Marland Kitchen triamcinolone (NASACORT) 55 MCG/ACT AERO nasal inhaler Place 2 sprays into the nose 2 (two) times daily as needed.    Marland Kitchen UNABLE TO FIND Inject 1 Dose as directed once a week. Allergy injections once a week    . DULoxetine (CYMBALTA) 30 MG capsule Take 1 capsule (30 mg total) by mouth daily. 30 capsule 3  . mupirocin ointment (BACTROBAN) 2 % Apply topically 2 (two) times daily. 22 g 0  . pomalidomide (POMALYST) 2 MG capsule Take 1 capsule (2 mg total) by mouth daily. 2 mg  Daily for 21 days, then 7 days off, [employer] Auth # [id number]  Date Obtained 09/21/2020 21 capsule 0   No current facility-administered medications for this visit.    OBJECTIVE: Vitals:   08/05/20 1037  BP: (!) 113/59  Pulse: 77  Resp: 18  Temp: 97.8 F (36.6 C)  SpO2: 100%     There is no height or weight on file to calculate BMI.    ECOG FS:0 - Asymptomatic  General: Well-developed, well-nourished, no acute distress. Eyes: Pink  conjunctiva, anicteric sclera. Lungs: Clear to auscultation bilaterally.  No audible wheezing or coughing Heart: Regular rate and rhythm.  Abdomen: Soft, nontender, nondistended.  Musculoskeletal: No edema, cyanosis, or clubbing. Neuro: Alert, answering all questions appropriately. Cranial nerves grossly intact. Skin: No rashes or petechiae noted. Psych: Normal affect.  LAB RESULTS:  Lab Results  Component Value Date   NA 138 09/09/2020   K 4.2 09/09/2020   CL 106 09/09/2020   CO2 25 09/09/2020   GLUCOSE 92 09/09/2020   BUN 25 (H) 09/09/2020   CREATININE 0.95 09/09/2020   CALCIUM 9.2 09/09/2020   PROT 6.4 (L) 09/09/2020   ALBUMIN 4.0 09/09/2020   AST 18 09/09/2020   ALT 13 09/09/2020   ALKPHOS 89 09/09/2020   BILITOT 0.6 09/09/2020   GFRNONAA 59 (L) 09/09/2020   GFRAA 40 (L) 03/01/2020    Lab Results  Component Value Date  WBC 3.3 (L) 09/09/2020   NEUTROABS 1.6 (L) 09/09/2020   HGB 12.1 09/09/2020   HCT 35.5 (L) 09/09/2020   MCV 97.8 09/09/2020   PLT 193 09/09/2020   Lab Results  Component Value Date   TOTALPROTELP 6.0 09/09/2020   ALBUMINELP 3.8 09/09/2020   A1GS 0.2 09/09/2020   A2GS 0.6 09/09/2020   BETS 0.8 09/09/2020   GAMS 0.5 09/09/2020   MSPIKE 0.1 (H) 09/09/2020   SPEI Comment 09/09/2020    STUDIES: No results found.  ASSESSMENT:  Multiple myeloma in relapse.  PLAN:    1.  Multiple myeloma in relapse: Bone marrow biopsy on 04/09/2016 revealed 44% plasma cells and normal cytogenetics.  Metastatic bone survey on 03/12/2016, elevated lambda light chains, hypercalcemia, patient fit criteria for multiple myeloma was treated with single agent Revlimid.  Metastatic bone survey on 02/12/2019 revealed new small lesions in her calvarium.  M spike stable and unchanged previously.  She was unable to tolerate Revlimid and was switched to pomalidomide 2 mg daily for 21 days with a 7-day break.  IgG, IgM, and IgA uptrending though still somewhat decreased.  Lambda  light chains downtrending and now 1440.7. M spike not observed. Labs today reviewed and acceptable to proceed with cycle 12 of pomalidomide however due to neuropathy, pomalyst was held. Acceptable to restart at this time. Discussed that treatment options are limited at she is on lowest dose. Continue dose reduced zometa for history of hypercalcemia and bone disease. Return to clinic for follow up with J. Burns on 09/09/20.  2.  Chronic renal insufficiency: stable. Baseline Cr around 1.0. 0.99 today.  3.  Anemia: Chronic and unchanged.  Hemoglobin 12.0 today. 4.  Leukopenia: Resolved. 5.  Peripheral neuropathy: Suspect not related to MM or pomalyst. Recommend evaluation with OT. She is s/p acupuncture. Continue gabapentin or can trial duloxetine.   6.  Constipation: does not complain of this today. continue otc medications. Monitor.  7. Hypercalcemia: resolved. Ca 8.8 today. Proceed with zometa today.   Patient expressed understanding and was in agreement with this plan. She also understands that She can call clinic at any time with any questions, concerns, or complaints.   Beckey Rutter, DNP, AGNP-C [place] at [facility] [phone] (clinic)

## 2020-09-30 DIAGNOSIS — J301 Allergic rhinitis due to pollen: Secondary | ICD-10-CM | POA: Diagnosis not present

## 2020-10-01 DIAGNOSIS — Z8249 Family history of ischemic heart disease and other diseases of the circulatory system: Secondary | ICD-10-CM | POA: Diagnosis not present

## 2020-10-01 DIAGNOSIS — E039 Hypothyroidism, unspecified: Secondary | ICD-10-CM | POA: Diagnosis not present

## 2020-10-01 DIAGNOSIS — C9000 Multiple myeloma not having achieved remission: Secondary | ICD-10-CM | POA: Diagnosis not present

## 2020-10-01 DIAGNOSIS — Z85828 Personal history of other malignant neoplasm of skin: Secondary | ICD-10-CM | POA: Diagnosis not present

## 2020-10-01 DIAGNOSIS — J301 Allergic rhinitis due to pollen: Secondary | ICD-10-CM | POA: Diagnosis not present

## 2020-10-07 DIAGNOSIS — J301 Allergic rhinitis due to pollen: Secondary | ICD-10-CM | POA: Diagnosis not present

## 2020-10-08 NOTE — Progress Notes (Signed)
[place]  [phone] [phone]  ID: [name] OB: 1934-01-29  MR#: [id number]  PJA#:[id number]  Patient Care Team: Lavera Guise, MD as PCP - General (Internal Medicine) Lloyd Huger, MD as Consulting Physician (Oncology)   CHIEF COMPLAINT:  Multiple myeloma in relapse.  INTERVAL HISTORY: Patient returns to clinic today for further evaluation and continuation of Zometa and pomalidomide.  She has chronic weakness and fatigue, but otherwise feels well and is tolerating her treatments without significant side effects.  She continues to have a chronic, mild peripheral neuropathy that does not affect her day-to-day activity. She has no other neurologic complaints. She denies any recent fevers or illnesses. She has a good appetite and denies weight loss.  She denies any chest pain, shortness of breath, cough, or hemoptysis.  She denies any nausea, vomiting, constipation, or diarrhea.  She has no melena or hematochezia.  She has no urinary complaints.  Patient offers no further specific complaints today.  REVIEW OF SYSTEMS:   Review of Systems  Constitutional: Positive for malaise/fatigue. Negative for fever and weight loss.  HENT: Negative for congestion and sore throat.   Respiratory: Negative.  Negative for cough and shortness of breath.   Cardiovascular: Negative.  Negative for chest pain, palpitations and leg swelling.  Gastrointestinal: Negative.  Negative for abdominal pain, blood in stool, constipation, diarrhea, melena, nausea and vomiting.  Genitourinary: Negative.  Negative for dysuria.  Musculoskeletal: Negative.  Negative for back pain.  Skin: Negative.  Negative for rash.  Neurological: Positive for tingling, sensory change and weakness. Negative for focal weakness.  Psychiatric/Behavioral: Negative.  The patient is not nervous/anxious and does not have insomnia.     As per HPI. Otherwise, a complete review of systems is  negative.  PAST MEDICAL HISTORY: Past Medical History:  Diagnosis Date  . Actinic keratosis   . Arthritis    hands  . Benign neoplasm of ascending colon   . Cataract   . GERD (gastroesophageal reflux disease)   . Hyperlipemia   . Hypertension 01/31/2015  . Hypothyroidism   . Melanoma ([place]) 04/28/2018   R mid to distal ant lat thigh. MM, SS, tumor thickness 0.108m, antatomic level II. Excised: 05/13/18, margins free  . [place] 04[place]  . [place]18[place]  . Osteoporosis   . Shingles   . Skin cancer    basal cell  . Squamous cell carcinoma of skin 02/04/2014   Left pretibial. KA-like pattern  . Squamous cell carcinoma of skin 09/15/2015   Right lat. superior ankle are. Superficial infiltration. Tx: EDC  . Squamous cell carcinoma of skin 10/15/2016   Left medial mid lower leg. Tx: EDC  . Squamous cell carcinoma of skin 11/27/2018   Right dorsum proximal forearm.  . Squamous cell carcinoma of skin 09/09/2019   Right cheek lat. to mid nasolabial area. Tx: EDC  . Squamous cell carcinoma of skin 12/09/2019   right pretibial below knee    PAST SURGICAL HISTORY: Past Surgical History:  Procedure Laterality Date  . BREAST EXCISIONAL BIOPSY Right 15+ yrs ago   EXCISIONAL - NEG  . CATARACT EXTRACTION W/ INTRAOCULAR LENS IMPLANT    . CHOLECYSTECTOMY N/A 03/14/2017   Procedure: LAPAROSCOPIC CHOLECYSTECTOMY;  Surgeon: CFlorene Glen MD;  Location: [facility];  Service: General;  Laterality: N/A;  . COLONOSCOPY WITH PROPOFOL N/A 02/21/2015   Procedure: COLONOSCOPY WITH PROPOFOL;  Surgeon: DLucilla Lame MD;  [place]  Service:  Endoscopy;  Laterality: N/A;  . [place]  . POLYPECTOMY  02/21/2015   Procedure: POLYPECTOMY;  Surgeon: Lucilla Lame, MD;  Location: [place];  Service: Endoscopy;;    FAMILY HISTORY: Family History  Problem Relation Age of Onset  . Heart disease Mother   .  Stroke Father   . Heart disease Brother   . Kidney cancer Neg Hx   . Prostate cancer Neg Hx   . Bladder Cancer Neg Hx   . Breast cancer Neg Hx     ADVANCED DIRECTIVES (Y/N):  N  HEALTH MAINTENANCE: Social History   Tobacco Use  . Smoking status: Never Smoker  . Smokeless tobacco: Never Used  Vaping Use  . Vaping Use: Never used  Substance Use Topics  . Alcohol use: No    Alcohol/week: 0.0 standard drinks  . Drug use: No     Colonoscopy:  PAP:  Bone density:  Lipid panel:  Allergies  Allergen Reactions  . Revlimid [Lenalidomide]     Current Outpatient Medications  Medication Sig Dispense Refill  . aspirin EC 81 MG tablet Take 81 mg by mouth daily.    . Cyanocobalamin (B-12 PO) Take 500 mcg by mouth daily.     . DULoxetine (CYMBALTA) 30 MG capsule Take 1 capsule (30 mg total) by mouth daily. 30 capsule 3  . EPINEPHrine 0.3 mg/0.3 mL IJ SOAJ injection     . fexofenadine (ALLEGRA) 30 MG tablet Take 30 mg by mouth daily.     Marland Kitchen gabapentin (NEURONTIN) 300 MG capsule Take 1 capsule (300 mg total) by mouth 2 (two) times daily as needed (numbness & tingling). 30 capsule 2  . levothyroxine (SYNTHROID) 50 MCG tablet TAKE 1 TABLET BY MOUTH DAILY BEFORE BREAKFAST 90 tablet 3  . Multiple Vitamins-Minerals (CENTRUM SILVER PO) Take 1 tablet by mouth daily.    . mupirocin ointment (BACTROBAN) 2 % Apply topically 2 (two) times daily. 22 g 0  . pomalidomide (POMALYST) 2 MG capsule Take 1 capsule (2 mg total) by mouth daily. 2 mg  Daily for 21 days, then 7 days off, [employer] Auth # [id number]  Date Obtained 09/21/2020 21 capsule 0  . triamcinolone (NASACORT) 55 MCG/ACT AERO nasal inhaler Place 2 sprays into the nose 2 (two) times daily as needed.    Marland Kitchen UNABLE TO FIND Inject 1 Dose as directed once a week. Allergy injections once a week     No current facility-administered medications for this visit.    OBJECTIVE: Vitals:   10/11/20 1332  BP: 107/68  Pulse: 99  Resp: 17  Temp: 97.6 F  (36.4 C)  SpO2: 98%     Body mass index is 22.57 kg/m.    ECOG FS:0 - Asymptomatic  General: Well-developed, well-nourished, no acute distress. Eyes: Pink conjunctiva, anicteric sclera. HEENT: Normocephalic, moist mucous membranes. Lungs: No audible wheezing or coughing. Heart: Regular rate and rhythm. Abdomen: Soft, nontender, no obvious distention. Musculoskeletal: No edema, cyanosis, or clubbing. Neuro: Alert, answering all questions appropriately. Cranial nerves grossly intact. Skin: No rashes or petechiae noted. Psych: Normal affect.  LAB RESULTS:  Lab Results  Component Value Date   NA 137 10/11/2020   K 4.4 10/11/2020   CL 105 10/11/2020   CO2 21 (L) 10/11/2020   GLUCOSE 109 (H) 10/11/2020   BUN 25 (H) 10/11/2020   CREATININE 1.12 (H) 10/11/2020   CALCIUM 9.0 10/11/2020   PROT 6.3 (L) 10/11/2020   ALBUMIN 3.9 10/11/2020   AST 22 10/11/2020  ALT 15 10/11/2020   ALKPHOS 75 10/11/2020   BILITOT 0.5 10/11/2020   GFRNONAA 48 (L) 10/11/2020   GFRAA 40 (L) 03/01/2020    Lab Results  Component Value Date   WBC 4.9 10/11/2020   NEUTROABS 3.2 10/11/2020   HGB 12.1 10/11/2020   HCT 36.5 10/11/2020   MCV 98.6 10/11/2020   PLT 193 10/11/2020   Lab Results  Component Value Date   TOTALPROTELP 6.0 09/09/2020   ALBUMINELP 3.8 09/09/2020   A1GS 0.2 09/09/2020   A2GS 0.6 09/09/2020   BETS 0.8 09/09/2020   GAMS 0.5 09/09/2020   MSPIKE 0.1 (H) 09/09/2020   SPEI Comment 09/09/2020     STUDIES: No results found.  ASSESSMENT:  Multiple myeloma in relapse.  PLAN:    1.  Multiple myeloma in relapse: Bone marrow biopsy on April 09, 2016 revealed 44% plasma cells and normal cytogenetics. Given the results of her metastatic bone survey on March 12, 2016, her elevated lambda free chains, and hypercalcemia, patient fit the criteria for multiple myeloma and was initially treated with single agent Revlimid. Metastatic bone survey on February 12, 2019 revealed new small  lesions in her calvarium.  Patient's most recent M spike is 0.1.  IgG, IgM, and IgA are all mildly decreased and unchanged.  Lambda light chains continue to trend down and are now 1202.3.  Patient could not tolerate Revlimid and was switched to pomalidomide 2 mg daily for 21 days with a 7-day break.  Proceed with treatment today.  Continue with dose reduced Zometa.  Patient reinitiated treatment on December 09, 2019.  Return to clinic in 4 weeks for further evaluation proceed and continuation of treatment.  At which point, patient can be transitioned to receiving Zometa every 3 months.   2.  Chronic renal insufficiency: Patient's creatinine slightly elevated at 1.12 today.  Monitor. 3.  Anemia: Resolved. 4.  Leukopenia: Resolved. 5.  Peripheral neuropathy: Chronic and unchanged.  Patient was previously given a referral to acupuncture which she said did not help much.  6.  Constipation: Patient does not complain of this today.  Continue OTC remedies as needed. 7.  Hypercalcemia: Resolved.  Proceed with Zometa as above.   Patient expressed understanding and was in agreement with this plan. She also understands that She can call clinic at any time with any questions, concerns, or complaints.    Lloyd Huger, MD 10/13/20 7:02 AM

## 2020-10-09 ENCOUNTER — Other Ambulatory Visit: Payer: Self-pay | Admitting: Oncology

## 2020-10-09 DIAGNOSIS — C9000 Multiple myeloma not having achieved remission: Secondary | ICD-10-CM

## 2020-10-11 ENCOUNTER — Encounter: Payer: Self-pay | Admitting: Oncology

## 2020-10-11 ENCOUNTER — Other Ambulatory Visit: Payer: Self-pay

## 2020-10-11 ENCOUNTER — Inpatient Hospital Stay (HOSPITAL_BASED_OUTPATIENT_CLINIC_OR_DEPARTMENT_OTHER): Payer: Medicare PPO | Admitting: Oncology

## 2020-10-11 ENCOUNTER — Inpatient Hospital Stay: Payer: Medicare PPO | Attending: Oncology

## 2020-10-11 ENCOUNTER — Inpatient Hospital Stay: Payer: Medicare PPO

## 2020-10-11 VITALS — BP 107/68 | HR 99 | Temp 97.6°F | Resp 17 | Wt 123.4 lb

## 2020-10-11 DIAGNOSIS — Z862 Personal history of diseases of the blood and blood-forming organs and certain disorders involving the immune mechanism: Secondary | ICD-10-CM | POA: Diagnosis not present

## 2020-10-11 DIAGNOSIS — C9000 Multiple myeloma not having achieved remission: Secondary | ICD-10-CM

## 2020-10-11 DIAGNOSIS — R5383 Other fatigue: Secondary | ICD-10-CM | POA: Insufficient documentation

## 2020-10-11 DIAGNOSIS — I129 Hypertensive chronic kidney disease with stage 1 through stage 4 chronic kidney disease, or unspecified chronic kidney disease: Secondary | ICD-10-CM | POA: Diagnosis not present

## 2020-10-11 DIAGNOSIS — G629 Polyneuropathy, unspecified: Secondary | ICD-10-CM | POA: Insufficient documentation

## 2020-10-11 DIAGNOSIS — C9002 Multiple myeloma in relapse: Secondary | ICD-10-CM | POA: Insufficient documentation

## 2020-10-11 DIAGNOSIS — Z85820 Personal history of malignant melanoma of skin: Secondary | ICD-10-CM | POA: Diagnosis not present

## 2020-10-11 DIAGNOSIS — N189 Chronic kidney disease, unspecified: Secondary | ICD-10-CM | POA: Insufficient documentation

## 2020-10-11 LAB — COMPREHENSIVE METABOLIC PANEL
ALT: 15 U/L (ref 0–44)
AST: 22 U/L (ref 15–41)
Albumin: 3.9 g/dL (ref 3.5–5.0)
Alkaline Phosphatase: 75 U/L (ref 38–126)
Anion gap: 11 (ref 5–15)
BUN: 25 mg/dL — ABNORMAL HIGH (ref 8–23)
CO2: 21 mmol/L — ABNORMAL LOW (ref 22–32)
Calcium: 9 mg/dL (ref 8.9–10.3)
Chloride: 105 mmol/L (ref 98–111)
Creatinine, Ser: 1.12 mg/dL — ABNORMAL HIGH (ref 0.44–1.00)
GFR, Estimated: 48 mL/min — ABNORMAL LOW (ref >60)
Glucose, Bld: 109 mg/dL — ABNORMAL HIGH (ref 70–99)
Potassium: 4.4 mmol/L (ref 3.5–5.1)
Sodium: 137 mmol/L (ref 135–145)
Total Bilirubin: 0.5 mg/dL (ref 0.3–1.2)
Total Protein: 6.3 g/dL — ABNORMAL LOW (ref 6.5–8.1)

## 2020-10-11 LAB — CBC WITH DIFFERENTIAL/PLATELET
Abs Immature Granulocytes: 0.02 10*3/uL (ref 0.00–0.07)
Basophils Absolute: 0.1 10*3/uL (ref 0.0–0.1)
Basophils Relative: 1 %
Eosinophils Absolute: 0.1 10*3/uL (ref 0.0–0.5)
Eosinophils Relative: 1 %
HCT: 36.5 % (ref 36.0–46.0)
Hemoglobin: 12.1 g/dL (ref 12.0–15.0)
Immature Granulocytes: 0 %
Lymphocytes Relative: 21 %
Lymphs Abs: 1 10*3/uL (ref 0.7–4.0)
MCH: 32.7 pg (ref 26.0–34.0)
MCHC: 33.2 g/dL (ref 30.0–36.0)
MCV: 98.6 fL (ref 80.0–100.0)
Monocytes Absolute: 0.5 10*3/uL (ref 0.1–1.0)
Monocytes Relative: 10 %
Neutro Abs: 3.2 10*3/uL (ref 1.7–7.7)
Neutrophils Relative %: 67 %
Platelets: 193 10*3/uL (ref 150–400)
RBC: 3.7 MIL/uL — ABNORMAL LOW (ref 3.87–5.11)
RDW: 13.1 % (ref 11.5–15.5)
WBC: 4.9 10*3/uL (ref 4.0–10.5)
nRBC: 0 % (ref 0.0–0.2)

## 2020-10-11 MED ORDER — SODIUM CHLORIDE 0.9 % IV SOLN
Freq: Once | INTRAVENOUS | Status: AC
  Filled 2020-10-11: qty 250

## 2020-10-11 MED ORDER — ZOLEDRONIC ACID 4 MG/5ML IV CONC
3.0000 mg | Freq: Once | INTRAVENOUS | Status: AC
  Administered 2020-10-11: 3 mg via INTRAVENOUS
  Filled 2020-10-11: qty 3.75

## 2020-10-11 NOTE — Progress Notes (Signed)
Cr 1.12, okay to proceed with Zometa per Dr. Grayland Ormond.

## 2020-10-14 DIAGNOSIS — J301 Allergic rhinitis due to pollen: Secondary | ICD-10-CM | POA: Diagnosis not present

## 2020-10-17 ENCOUNTER — Other Ambulatory Visit: Payer: Self-pay

## 2020-10-17 ENCOUNTER — Ambulatory Visit: Payer: Medicare PPO | Admitting: Nurse Practitioner

## 2020-10-17 ENCOUNTER — Telehealth: Payer: Self-pay

## 2020-10-17 ENCOUNTER — Encounter: Payer: Self-pay | Admitting: Nurse Practitioner

## 2020-10-17 DIAGNOSIS — J019 Acute sinusitis, unspecified: Secondary | ICD-10-CM

## 2020-10-17 DIAGNOSIS — H6121 Impacted cerumen, right ear: Secondary | ICD-10-CM

## 2020-10-17 DIAGNOSIS — Z1152 Encounter for screening for COVID-19: Secondary | ICD-10-CM

## 2020-10-17 MED ORDER — DOXYCYCLINE HYCLATE 100 MG PO TABS: ORAL_TABLET | ORAL | 0 refills | Status: DC

## 2020-10-17 NOTE — Telephone Encounter (Signed)
Spoke with [facility] as per dr Humphrey Rolls its ok to do generic for Doxycycline

## 2020-10-17 NOTE — Progress Notes (Signed)
Established Patient Office Visit  Subjective:  Patient ID: Rhonda [name], female    DOB: 1934/03/22  Age: 87 y.o. MRN: [id number]  CC:  Chief Complaint  Patient presents with  . Acute Visit    Sinus infection, ear pressure, not tested for covid, OTC meds not helping, started about a week ago    [name]   Past Medical History:  Diagnosis Date  . Actinic keratosis   . Arthritis    hands  . Benign neoplasm of ascending colon   . Cataract   . GERD (gastroesophageal reflux disease)   . Hyperlipemia   . Hypertension 01/31/2015  . Hypothyroidism   . Melanoma ([place]) 04/28/2018   R mid to distal ant lat thigh. MM, SS, tumor thickness 0.1m, antatomic level II. Excised: 05/13/18, margins free  . [place]  . [place]18[place]  . Osteoporosis   . Shingles   . Skin cancer    basal cell  . Squamous cell carcinoma of skin 02/04/2014   Left pretibial. KA-like pattern  . Squamous cell carcinoma of skin 09/15/2015   Right lat. superior ankle are. Superficial infiltration. Tx: EDC  . Squamous cell carcinoma of skin 10/15/2016   Left medial mid lower leg. Tx: EDC  . Squamous cell carcinoma of skin 11/27/2018   Right dorsum proximal forearm.  . Squamous cell carcinoma of skin 09/09/2019   Right cheek lat. to mid nasolabial area. Tx: EDC  . Squamous cell carcinoma of skin 12/09/2019   right pretibial below knee    Past Surgical History:  Procedure Laterality Date  . BREAST EXCISIONAL BIOPSY Right 15+ yrs ago   EXCISIONAL - NEG  . CATARACT EXTRACTION W/ INTRAOCULAR LENS IMPLANT    . CHOLECYSTECTOMY N/A 03/14/2017   Procedure: LAPAROSCOPIC CHOLECYSTECTOMY;  Surgeon: CFlorene Glen MD;  Location: [facility];  Service: General;  Laterality: N/A;  . COLONOSCOPY WITH PROPOFOL N/A 02/21/2015   Procedure: COLONOSCOPY WITH PROPOFOL;  Surgeon: DLucilla Lame MD;  [place]  Service: Endoscopy;  Laterality: N/A;   . [place] POL[place]  02/21/2015   Procedure: POLYPECTOMY;  Surgeon: DLucilla Lame MD;  [place]  Service: Endoscopy;;    Family History  Problem Relation Age of Onset  . Heart disease Mother   . Stroke Father   . Heart disease Brother   . Kidney cancer Neg Hx   . Prostate cancer Neg Hx   . Bladder Cancer Neg Hx   . Breast cancer Neg Hx     Social History   Socioeconomic History  . Marital status: Widowed    Spouse name: Not on file  . Number of children: Not on file  . Years of education: Not on file  . Highest education level: Not on file  Occupational History  . Not on file  Tobacco Use  . Smoking status: Never Smoker  . Smokeless tobacco: Never Used  Vaping Use  . Vaping Use: Never used  Substance and Sexual Activity  . Alcohol use: No    Alcohol/week: 0.0 standard drinks  . Drug use: No  . Sexual activity: Not on file  Other Topics Concern  . Not on file  Social History Narrative  . Not on file   Social Determinants of Health   Financial Resource Strain: Not on file  Food Insecurity: Not on file  Transportation Needs: Not on file  Physical Activity: Not on file  Stress: Not on file  Social Connections: Not on file  Intimate Partner Violence: Not on file    Outpatient Medications Prior to Visit  Medication Sig Dispense Refill  . aspirin EC 81 MG tablet Take 81 mg by mouth daily.    . Cyanocobalamin (B-12 PO) Take 500 mcg by mouth daily.     . DULoxetine (CYMBALTA) 30 MG capsule Take 1 capsule (30 mg total) by mouth daily. 30 capsule 3  . EPINEPHrine 0.3 mg/0.3 mL IJ SOAJ injection     . fexofenadine (ALLEGRA) 30 MG tablet Take 30 mg by mouth daily.     Marland Kitchen gabapentin (NEURONTIN) 300 MG capsule Take 1 capsule (300 mg total) by mouth 2 (two) times daily as needed (numbness & tingling). 30 capsule 2  . levothyroxine (SYNTHROID) 50 MCG tablet TAKE 1 TABLET BY MOUTH DAILY BEFORE BREAKFAST 90 tablet 3  .  Multiple Vitamins-Minerals (CENTRUM SILVER PO) Take 1 tablet by mouth daily.    . mupirocin ointment (BACTROBAN) 2 % Apply topically 2 (two) times daily. 22 g 0  . POMALYST 2 MG capsule TAKE 1 CAPSULE (2MG TOTAL) BY MOUTH EVERY DAY FOR 21 DAYS, THEN 7 DAYS OFF 21 capsule 0  . triamcinolone (NASACORT) 55 MCG/ACT AERO nasal inhaler Place 2 sprays into the nose 2 (two) times daily as needed.    Marland Kitchen UNABLE TO FIND Inject 1 Dose as directed once a week. Allergy injections once a week     No facility-administered medications prior to visit.    Allergies  Allergen Reactions  . Revlimid [Lenalidomide]    HPI Pt present for sick visit -pt reports ear pain L>R, muffled voices x1 week Pt reports sinus pain pressure x1 week. States she does have some environmental allergies (pollen and dust). Afebrile today and denies fever at home.  Associated symptoms addressed in ROS    ROS Review of Systems  Constitutional: Negative.   HENT: Positive for congestion, drooling, ear pain, hearing loss, postnasal drip, rhinorrhea, sinus pressure, sneezing, sore throat and voice change.   Eyes: Positive for discharge, redness and itching.  Respiratory: Negative.   Cardiovascular: Negative.   Gastrointestinal: Negative.   Skin: Negative.   Allergic/Immunologic: Positive for environmental allergies.  Neurological: Positive for dizziness.      Objective:    Physical Exam Constitutional:      Appearance: Normal appearance. She is normal weight.  HENT:     Head: Normocephalic and atraumatic.     Comments: Sinus pressure pain with palpation in frontal sinus and bilateral cheeks    Right Ear: Tympanic membrane, ear canal and external ear normal.     Left Ear: External ear normal. There is impacted cerumen.     Ears:     Comments: Partially impacted cerumen    Nose: Congestion and rhinorrhea present.     Mouth/Throat:     Pharynx: Posterior oropharyngeal erythema present.  Eyes:     Extraocular  Movements: Extraocular movements intact.     Pupils: Pupils are equal, round, and reactive to light.     Comments: Conjunctiva erythematous   Cardiovascular:     Rate and Rhythm: Normal rate and regular rhythm.     Pulses: Normal pulses.     Heart sounds: Normal heart sounds.  Pulmonary:     Effort: Pulmonary effort is normal.     Breath sounds: Normal breath sounds.  Musculoskeletal:     Cervical back: Normal range of motion and neck supple.  Lymphadenopathy:  Cervical: Cervical adenopathy present.  Skin:    General: Skin is warm and dry.  Neurological:     Mental Status: She is alert.     BP 126/74   Pulse 74   Temp (!) 97.2 F (36.2 C)   Resp 16   Ht '5\' 2"'  (1.575 m)   Wt 125 lb (56.7 kg)   SpO2 98%   BMI 22.86 kg/m  Wt Readings from Last 3 Encounters:  10/17/20 125 lb (56.7 kg)  10/11/20 123 lb 6.4 oz (56 kg)  09/09/20 124 lb (56.2 kg)     Health Maintenance Due  Topic Date Due  . PNA vac Low Risk Adult (2 of 2 - PPSV23) 06/19/2018    There are no preventive care reminders to display for this patient.  Lab Results  Component Value Date   TSH 3.110 03/17/2019   Lab Results  Component Value Date   WBC 4.9 10/11/2020   HGB 12.1 10/11/2020   HCT 36.5 10/11/2020   MCV 98.6 10/11/2020   PLT 193 10/11/2020   Lab Results  Component Value Date   NA 137 10/11/2020   K 4.4 10/11/2020   CO2 21 (L) 10/11/2020   GLUCOSE 109 (H) 10/11/2020   BUN 25 (H) 10/11/2020   CREATININE 1.12 (H) 10/11/2020   BILITOT 0.5 10/11/2020   ALKPHOS 75 10/11/2020   AST 22 10/11/2020   ALT 15 10/11/2020   PROT 6.3 (L) 10/11/2020   ALBUMIN 3.9 10/11/2020   CALCIUM 9.0 10/11/2020   ANIONGAP 11 10/11/2020   Lab Results  Component Value Date   CHOL 213 (H) 03/17/2019   Lab Results  Component Value Date   HDL 48 03/17/2019   Lab Results  Component Value Date   LDLCALC 143 (H) 03/17/2019   Lab Results  Component Value Date   TRIG 123 03/17/2019   No results  found for: CHOLHDL No results found for: HGBA1C    Assessment & Plan:   Problem List Items Addressed This Visit   None   Visit Diagnoses    Acute non-recurrent sinusitis, unspecified location       Relevant Medications   doxycycline (VIBRA-TABS) 100 MG tablet   Encounter for screening for COVID-19       Relevant Orders   POC SOFIA 2 FLU + SARS ANTIGEN FIA   Impacted cerumen of right ear       Relevant Orders   Ear Lavage (Completed)      Meds ordered this encounter  Medications  . doxycycline (VIBRA-TABS) 100 MG tablet    Sig: Take 2 tablets once daily x 7 days    Dispense:  14 tablet    Refill:  0   1. Acute non-recurrent sinusitis, unspecified location Pt empirically treated with doxycycline x7 days. If no improvement or symptoms worsen within the first 3 days of antibiotic course, pt instructed to call the clinic or come into office. No follow up need if symptoms resolve after antibiotic course is completed. 2. Encounter for screening for COVID-19  Due to presenting symptoms, pt was tested in office for COVID-19. Denies any exposure to anyone positive for or suspected to be positive for COVID-19. COVID-19 PCR was negative for this visit.   3. Impacted cerumen of right ear   right ear unable to be visualized due to impacted cerumen. Cerumen removal done in office today.   Follow-up: Return if symptoms worsen or fail to improve.    Jonetta Osgood, NP

## 2020-10-24 LAB — POC SOFIA 2 FLU + SARS ANTIGEN FIA: SARS Coronavirus 2 Ag: NEGATIVE

## 2020-10-28 DIAGNOSIS — J301 Allergic rhinitis due to pollen: Secondary | ICD-10-CM | POA: Diagnosis not present

## 2020-11-03 ENCOUNTER — Other Ambulatory Visit: Payer: Self-pay | Admitting: Oncology

## 2020-11-03 DIAGNOSIS — C9000 Multiple myeloma not having achieved remission: Secondary | ICD-10-CM

## 2020-11-03 NOTE — Progress Notes (Signed)
[facility]  [phone] [phone]  ID: Rhonda [name] OB: 17-Nov-1933  MR#: [id number]  AOZ#:[id number]  Patient Care Team: Lavera Guise, MD as PCP - General (Internal Medicine) Rhonda Huger, MD as Consulting Physician (Oncology)   CHIEF COMPLAINT:  Multiple myeloma in relapse.  INTERVAL HISTORY: Patient returns to clinic today for further evaluation and continuation of monthly Zometa along with pomalidomide.  She continues to have chronic weakness and fatigue, but otherwise feels well and is tolerating her treatments without significant side effects.  She has a chronic, mild peripheral neuropathy that does not affect her day-to-day activity. She has no other neurologic complaints. She denies any recent fevers or illnesses. She has a good appetite and denies weight loss.  She denies any chest [name], shortness of breath, cough, or hemoptysis.  She denies any nausea, vomiting, constipation, or diarrhea.  She has no melena or hematochezia.  She has no urinary complaints.  Patient offers no further specific complaints today.  REVIEW OF SYSTEMS:   Review of Systems  Constitutional: Positive for malaise/fatigue. Negative for fever and weight loss.  HENT: Negative for congestion and sore throat.   Respiratory: Negative.  Negative for cough and shortness of breath.   Cardiovascular: Negative.  Negative for chest [name], palpitations and leg swelling.  Gastrointestinal: Negative.  Negative for abdominal [name], blood in stool, constipation, diarrhea, melena, nausea and vomiting.  Genitourinary: Negative.  Negative for dysuria.  Musculoskeletal: Negative.  Negative for back [name].  Skin: Negative.  Negative for rash.  Neurological: Positive for tingling, sensory change and weakness. Negative for focal weakness.  Psychiatric/Behavioral: Negative.  The patient is not nervous/anxious and does not have insomnia.     As per HPI. Otherwise, a complete review  of systems is negative.  PAST MEDICAL HISTORY: Past Medical History:  Diagnosis Date  . Actinic keratosis   . Arthritis    hands  . Benign neoplasm of ascending colon   . Cataract   . GERD (gastroesophageal reflux disease)   . Hyperlipemia   . Hypertension 01/31/2015  . Hypothyroidism   . Melanoma ([place]) 04/28/2018   R mid to distal ant lat thigh. MM, SS, tumor thickness 0.48m, antatomic level II. Excised: 05/13/18, margins free  . [place] [facility] 04/18[place]  . Multiple myeloma in remission ([facility] 04/18/2016  . Osteoporosis   . Shingles   . Skin cancer    basal cell  . Squamous cell carcinoma of skin 02/04/2014   Left pretibial. KA-like pattern  . Squamous cell carcinoma of skin 09/15/2015   Right lat. superior ankle are. Superficial infiltration. Tx: EDC  . Squamous cell carcinoma of skin 10/15/2016   Left medial mid lower leg. Tx: EDC  . Squamous cell carcinoma of skin 11/27/2018   Right dorsum proximal forearm.  . Squamous cell carcinoma of skin 09/09/2019   Right cheek lat. to mid nasolabial area. Tx: EDC  . Squamous cell carcinoma of skin 12/09/2019   right pretibial below knee    PAST SURGICAL HISTORY: Past Surgical History:  Procedure Laterality Date  . BREAST EXCISIONAL BIOPSY Right 15+ yrs ago   EXCISIONAL - NEG  . CATARACT EXTRACTION W/ INTRAOCULAR LENS IMPLANT    . CHOLECYSTECTOMY N/A 03/14/2017   Procedure: LAPAROSCOPIC CHOLECYSTECTOMY;  Surgeon: CFlorene Glen MD;  Location: [facility];  Service: General;  Laterality: N/A;  . COLONOSCOPY WITH PROPOFOL N/A 02/21/2015   Procedure: COLONOSCOPY WITH PROPOFOL;  Surgeon: DLucilla Lame MD;  [place] [facility]  Service: Endoscopy;  Laterality: N/A;  . [place]  . POLYPECTOMY  02/21/2015   Procedure: POLYPECTOMY;  Surgeon: Lucilla Lame, MD;  Location: [facility];  Service: Endoscopy;;    FAMILY HISTORY: Family History  Problem Relation Age of Onset  . Heart disease  Mother   . Stroke Father   . Heart disease Brother   . Kidney cancer Neg Hx   . Prostate cancer Neg Hx   . Bladder Cancer Neg Hx   . Breast cancer Neg Hx     ADVANCED DIRECTIVES (Y/N):  N  HEALTH MAINTENANCE: Social History   Tobacco Use  . Smoking status: Never Smoker  . Smokeless tobacco: Never Used  Vaping Use  . Vaping Use: Never used  Substance Use Topics  . Alcohol use: No    Alcohol/week: 0.0 standard drinks  . Drug use: No     Colonoscopy:  PAP:  Bone density:  Lipid panel:  Allergies  Allergen Reactions  . Revlimid [Lenalidomide]     Current Outpatient Medications  Medication Sig Dispense Refill  . aspirin EC 81 MG tablet Take 81 mg by mouth daily.    . Cyanocobalamin (B-12 PO) Take 500 mcg by mouth daily.     . DULoxetine (CYMBALTA) 30 MG capsule Take 1 capsule (30 mg total) by mouth daily. 30 capsule 3  . EPINEPHrine 0.3 mg/0.3 mL IJ SOAJ injection     . fexofenadine (ALLEGRA) 30 MG tablet Take 30 mg by mouth daily.     Marland Kitchen gabapentin (NEURONTIN) 300 MG capsule Take 1 capsule (300 mg total) by mouth 2 (two) times daily as needed (numbness & tingling). 30 capsule 2  . levothyroxine (SYNTHROID) 50 MCG tablet TAKE 1 TABLET BY MOUTH DAILY BEFORE BREAKFAST 90 tablet 3  . Multiple Vitamins-Minerals (CENTRUM SILVER PO) Take 1 tablet by mouth daily.    Marland Kitchen POMALYST 2 MG capsule TAKE 1 CAPSULE (2MG TOTAL) BY MOUTH EVERY DAY FOR 21 DAYS, THEN  7 DAYS OFF 21 capsule 0  . triamcinolone (NASACORT) 55 MCG/ACT AERO nasal inhaler Place 2 sprays into the nose 2 (two) times daily as needed.    Marland Kitchen UNABLE TO FIND Inject 1 Dose as directed once a week. Allergy injections once a week    . doxycycline (VIBRA-TABS) 100 MG tablet Take 2 tablets once daily x 7 days 14 tablet 0  . mupirocin ointment (BACTROBAN) 2 % Apply topically 2 (two) times daily. 22 g 0   No current facility-administered medications for this visit.   Facility-Administered Medications Ordered in Other Visits   Medication Dose Route Frequency Provider Last Rate Last Admin  . zolendronic acid (ZOMETA) 3 mg in sodium chloride 0.9 % 100 mL IVPB  3 mg Intravenous Once Rhonda Huger, MD        OBJECTIVE: Vitals:   11/08/20 1310  BP: 120/63  Pulse: 70  Resp: 17  Temp: (!) 96.9 F (36.1 C)  SpO2: 100%     Body mass index is 23.21 kg/m.    ECOG FS:0 - Asymptomatic  General: Well-developed, well-nourished, no acute distress. Eyes: Pink conjunctiva, anicteric sclera. HEENT: Normocephalic, moist mucous membranes. Lungs: No audible wheezing or coughing. Heart: Regular rate and rhythm. Abdomen: Soft, nontender, no obvious distention. Musculoskeletal: No edema, cyanosis, or clubbing. Neuro: Alert, answering all questions appropriately. Cranial nerves grossly intact. Skin: No rashes or petechiae noted. Psych: Normal affect.  LAB RESULTS:  Lab Results  Component Value Date   NA 134 (L) 11/08/2020  K 4.4 11/08/2020   CL 103 11/08/2020   CO2 22 11/08/2020   GLUCOSE 110 (H) 11/08/2020   BUN 23 11/08/2020   CREATININE 0.89 11/08/2020   CALCIUM 8.7 (L) 11/08/2020   PROT 6.3 (L) 10/11/2020   ALBUMIN 3.9 10/11/2020   AST 22 10/11/2020   ALT 15 10/11/2020   ALKPHOS 75 10/11/2020   BILITOT 0.5 10/11/2020   GFRNONAA >60 11/08/2020   GFRAA 40 (L) 03/01/2020    Lab Results  Component Value Date   WBC 5.2 11/08/2020   NEUTROABS 3.2 11/08/2020   HGB 11.8 (L) 11/08/2020   HCT 34.9 (L) 11/08/2020   MCV 96.7 11/08/2020   PLT 172 11/08/2020   Lab Results  Component Value Date   TOTALPROTELP 6.0 09/09/2020   ALBUMINELP 3.8 09/09/2020   A1GS 0.2 09/09/2020   A2GS 0.6 09/09/2020   BETS 0.8 09/09/2020   GAMS 0.5 09/09/2020   MSPIKE 0.1 (H) 09/09/2020   SPEI Comment 09/09/2020     STUDIES: No results found.  ASSESSMENT:  Multiple myeloma in relapse.  PLAN:    1.  Multiple myeloma in relapse: Bone marrow biopsy on April 09, 2016 revealed 44% plasma cells and normal  cytogenetics. Given the results of her metastatic bone survey on March 12, 2016, her elevated lambda free chains, and hypercalcemia, patient fit the criteria for multiple myeloma and was initially treated with single agent Revlimid. Metastatic bone survey on February 12, 2019 revealed new small lesions in her calvarium.  Patient's most recent M spike is 0.1.  IgG, IgM, and IgA are all mildly decreased and unchanged.  Lambda light chains continue to trend down and are now 1202.3.  Today's results are pending.  Patient could not tolerate Revlimid and was switched to pomalidomide 2 mg daily for 21 days with a 7-day break.  Proceed with treatment today.  Continue with dose reduced Zometa.  Patient has now completed 1 year of monthly treatment, therefore will return to clinic in 6 weeks for laboratory work only and then in 3 months for laboratory work, further evaluation, and continuation of Zometa.  2.  Chronic renal insufficiency: Resolved. 3.  Anemia: Mild.  Patient's hemoglobin is 11.8 today. 4.  Leukopenia: Resolved. 5.  Peripheral neuropathy: Chronic and unchanged.  Patient was previously given a referral to acupuncture which she said did not help much.  6.  Constipation: Patient does not complain of this today.  Continue OTC remedies as needed. 7.  Hypercalcemia: Resolved.  Proceed with Zometa as above.  I spent a total of 30 minutes reviewing chart data, face-to-face evaluation with the patient, counseling and coordination of care as detailed above.    Patient expressed understanding and was in agreement with this plan. She also understands that She can call clinic at any time with any questions, concerns, or complaints.    Rhonda Huger, MD 11/08/20 2:15 PM

## 2020-11-04 DIAGNOSIS — J301 Allergic rhinitis due to pollen: Secondary | ICD-10-CM | POA: Diagnosis not present

## 2020-11-08 ENCOUNTER — Other Ambulatory Visit: Payer: Self-pay

## 2020-11-08 ENCOUNTER — Encounter: Payer: Self-pay | Admitting: Oncology

## 2020-11-08 ENCOUNTER — Inpatient Hospital Stay (HOSPITAL_BASED_OUTPATIENT_CLINIC_OR_DEPARTMENT_OTHER): Payer: Medicare PPO | Admitting: Oncology

## 2020-11-08 ENCOUNTER — Inpatient Hospital Stay: Payer: Medicare PPO

## 2020-11-08 VITALS — BP 120/63 | HR 70 | Temp 96.9°F | Resp 17 | Ht 62.0 in | Wt 126.9 lb

## 2020-11-08 DIAGNOSIS — N189 Chronic kidney disease, unspecified: Secondary | ICD-10-CM | POA: Diagnosis not present

## 2020-11-08 DIAGNOSIS — C9000 Multiple myeloma not having achieved remission: Secondary | ICD-10-CM

## 2020-11-08 DIAGNOSIS — Z85820 Personal history of malignant melanoma of skin: Secondary | ICD-10-CM | POA: Diagnosis not present

## 2020-11-08 DIAGNOSIS — Z862 Personal history of diseases of the blood and blood-forming organs and certain disorders involving the immune mechanism: Secondary | ICD-10-CM | POA: Diagnosis not present

## 2020-11-08 DIAGNOSIS — G629 Polyneuropathy, unspecified: Secondary | ICD-10-CM | POA: Diagnosis not present

## 2020-11-08 DIAGNOSIS — R5383 Other fatigue: Secondary | ICD-10-CM | POA: Diagnosis not present

## 2020-11-08 DIAGNOSIS — C9002 Multiple myeloma in relapse: Secondary | ICD-10-CM | POA: Diagnosis not present

## 2020-11-08 DIAGNOSIS — I129 Hypertensive chronic kidney disease with stage 1 through stage 4 chronic kidney disease, or unspecified chronic kidney disease: Secondary | ICD-10-CM | POA: Diagnosis not present

## 2020-11-08 LAB — CBC WITH DIFFERENTIAL/PLATELET
Abs Immature Granulocytes: 0.03 10*3/uL (ref 0.00–0.07)
Basophils Absolute: 0.1 10*3/uL (ref 0.0–0.1)
Basophils Relative: 2 %
Eosinophils Absolute: 0.1 10*3/uL (ref 0.0–0.5)
Eosinophils Relative: 1 %
HCT: 34.9 % — ABNORMAL LOW (ref 36.0–46.0)
Hemoglobin: 11.8 g/dL — ABNORMAL LOW (ref 12.0–15.0)
Immature Granulocytes: 1 %
Lymphocytes Relative: 19 %
Lymphs Abs: 1 10*3/uL (ref 0.7–4.0)
MCH: 32.7 pg (ref 26.0–34.0)
MCHC: 33.8 g/dL (ref 30.0–36.0)
MCV: 96.7 fL (ref 80.0–100.0)
Monocytes Absolute: 0.8 10*3/uL (ref 0.1–1.0)
Monocytes Relative: 16 %
Neutro Abs: 3.2 10*3/uL (ref 1.7–7.7)
Neutrophils Relative %: 61 %
Platelets: 172 10*3/uL (ref 150–400)
RBC: 3.61 MIL/uL — ABNORMAL LOW (ref 3.87–5.11)
RDW: 13.2 % (ref 11.5–15.5)
WBC: 5.2 10*3/uL (ref 4.0–10.5)
nRBC: 0 % (ref 0.0–0.2)

## 2020-11-08 LAB — BASIC METABOLIC PANEL
Anion gap: 9 (ref 5–15)
BUN: 23 mg/dL (ref 8–23)
CO2: 22 mmol/L (ref 22–32)
Calcium: 8.7 mg/dL — ABNORMAL LOW (ref 8.9–10.3)
Chloride: 103 mmol/L (ref 98–111)
Creatinine, Ser: 0.89 mg/dL (ref 0.44–1.00)
GFR, Estimated: >60 mL/min (ref >60)
Glucose, Bld: 110 mg/dL — ABNORMAL HIGH (ref 70–99)
Potassium: 4.4 mmol/L (ref 3.5–5.1)
Sodium: 134 mmol/L — ABNORMAL LOW (ref 135–145)

## 2020-11-08 MED ORDER — ZOLEDRONIC ACID 4 MG/5ML IV CONC
3.0000 mg | Freq: Once | INTRAVENOUS | Status: AC
  Administered 2020-11-08: 3 mg via INTRAVENOUS
  Filled 2020-11-08: qty 3.75

## 2020-11-08 MED ORDER — SODIUM CHLORIDE 0.9 % IV SOLN
Freq: Once | INTRAVENOUS | Status: AC
  Filled 2020-11-08: qty 250

## 2020-11-08 NOTE — Patient Instructions (Signed)
[facility]    Discharge Instructions:  Thank you for choosing [facility] to provide your oncology and hematology care.  If you have a lab appointment with the [facility], please go directly to the [facility] and check in at the registration area.  We strive to give you quality time with your provider. You may need to reschedule your appointment if you arrive late (15 or more minutes).  Arriving late affects you and other patients whose appointments are after yours.  Also, if you miss three or more appointments without notifying the office, you may be dismissed from the clinic at the provider's discretion.      For prescription refill requests, have your pharmacy contact our office and allow 72 hours for refills to be completed.    Today you received the following treatment: Zometa.  BELOW ARE SYMPTOMS THAT SHOULD BE REPORTED IMMEDIATELY: . *FEVER GREATER THAN 100.4 F (38 C) OR HIGHER . *CHILLS OR SWEATING . *NAUSEA AND VOMITING THAT IS NOT CONTROLLED WITH YOUR NAUSEA MEDICATION . *UNUSUAL SHORTNESS OF BREATH . *UNUSUAL BRUISING OR BLEEDING . *URINARY PROBLEMS (pain or burning when urinating, or frequent urination) . *BOWEL PROBLEMS (unusual diarrhea, constipation, pain near the anus) . TENDERNESS IN MOUTH AND THROAT WITH OR WITHOUT PRESENCE OF ULCERS (sore throat, sores in mouth, or a toothache) . UNUSUAL RASH, SWELLING OR PAIN  . UNUSUAL VAGINAL DISCHARGE OR ITCHING   Items with * indicate a potential emergency and should be followed up as soon as possible or go to the Emergency Department if any problems should occur.  Should you have questions after your visit or need to cancel or reschedule your appointment, please contact [facility]  [phone] and follow the prompts.  Office hours are 8:00 a.m. to 4:30 p.m. Monday - Friday. Please note that voicemails left after 4:00 p.m. may not be  returned until the following business day.  We are closed weekends and major holidays. You have access to a nurse at all times for urgent questions. Please call the main number to the clinic [phone] and follow the prompts.  For any non-urgent questions, you may also contact your provider using MyChart. We now offer e-Visits for anyone 2 and older to request care online for non-urgent symptoms. For details visit [email or web address].   Also download the MyChart app! Go to the app store, search "MyChart", open the app, select [facility], and log in with your MyChart username and password.  Due to Covid, a mask is required upon entering the hospital/clinic. If you do not have a mask, one will be given to you upon arrival. For doctor visits, patients may have 1 support person aged 68 or older with them. For treatment visits, patients cannot have anyone with them due to current Covid guidelines and our immunocompromised population.   Zoledronic Acid Injection (Hypercalcemia, Oncology)  What is this medicine? ZOLEDRONIC ACID (ZOE le dron ik AS id) slows calcium loss from bones. It high calcium levels in the blood from some kinds of cancer. It may be used in other people at risk for bone loss. This medicine may be used for other purposes; ask your health care provider or pharmacist if you have questions. COMMON BRAND NAME(S): Zometa What should I tell my health care provider before I take this medicine? They need to know if you have any of these conditions:  cancer  dehydration  dental disease  kidney disease  liver  disease  low levels of calcium in the blood  lung or breathing disease (asthma)  receiving steroids like dexamethasone or prednisone  an unusual or allergic reaction to zoledronic acid, other medicines, foods, dyes, or preservatives  pregnant or trying to get pregnant  breast-feeding How should I use this medicine? This drug is injected into a vein. It is given by  a health care provider in a hospital or clinic setting. Talk to your health care provider about the use of this drug in children. Special care may be needed. Overdosage: If you think you have taken too much of this medicine contact a poison control center or emergency room at once. NOTE: This medicine is only for you. Do not share this medicine with others. What if I miss a dose? Keep appointments for follow-up doses. It is important not to miss your dose. Call your health care provider if you are unable to keep an appointment. What may interact with this medicine?  certain antibiotics given by injection  NSAIDs, medicines for pain and inflammation, like ibuprofen or naproxen  some diuretics like bumetanide, furosemide  teriparatide  thalidomide This list may not describe all possible interactions. Give your health care provider a list of all the medicines, herbs, non-prescription drugs, or dietary supplements you use. Also tell them if you smoke, drink alcohol, or use illegal drugs. Some items may interact with your medicine. What should I watch for while using this medicine? Visit your health care provider for regular checks on your progress. It may be some time before you see the benefit from this drug. Some people who take this drug have severe bone, joint, or muscle pain. This drug may also increase your risk for jaw problems or a broken thigh bone. Tell your health care provider right away if you have severe pain in your jaw, bones, joints, or muscles. Tell you health care provider if you have any pain that does not go away or that gets worse. Tell your dentist and dental surgeon that you are taking this drug. You should not have major dental surgery while on this drug. See your dentist to have a dental exam and fix any dental problems before starting this drug. Take good care of your teeth while on this drug. Make sure you see your dentist for regular follow-up appointments. You should  make sure you get enough calcium and vitamin D while you are taking this drug. Discuss the foods you eat and the vitamins you take with your health care provider. Check with your health care provider if you have severe diarrhea, nausea, and vomiting, or if you sweat a lot. The loss of too much body fluid may make it dangerous for you to take this drug. You may need blood work done while you are taking this drug. Do not become pregnant while taking this drug. Women should inform their health care provider if they wish to become pregnant or think they might be pregnant. There is potential for serious harm to an unborn child. Talk to your health care provider for more information. What side effects may I notice from receiving this medicine? Side effects that you should report to your doctor or health care provider as soon as possible:  allergic reactions (skin rash, itching or hives; swelling of the face, lips, or tongue)  bone pain  infection (fever, chills, cough, sore throat, pain or trouble passing urine)  jaw pain, especially after dental work  joint pain  kidney injury (trouble passing urine  or change in the amount of urine)  low blood pressure (dizziness; feeling faint or lightheaded, falls; unusually weak or tired)  low calcium levels (fast heartbeat; muscle cramps or pain; pain, tingling, or numbness in the hands or feet; seizures)  low magnesium levels (fast, irregular heartbeat; muscle cramp or pain; muscle weakness; tremors; seizures)  low red blood cell counts (trouble breathing; feeling faint; lightheaded, falls; unusually weak or tired)  muscle pain  redness, blistering, peeling, or loosening of the skin, including inside the mouth  severe diarrhea  swelling of the ankles, feet, hands  trouble breathing Side effects that usually do not require medical attention (report to your doctor or health care provider if they continue or are  bothersome):  anxious  constipation  coughing  depressed mood  eye irritation, itching, or pain  fever  general ill feeling or flu-like symptoms  nausea  pain, redness, or irritation at site where injected  trouble sleeping This list may not describe all possible side effects. Call your doctor for medical advice about side effects. You may report side effects to FDA at 1-800-FDA-1088. Where should I keep my medicine? This drug is given in a hospital or clinic. It will not be stored at home. NOTE: This sheet is a summary. It may not cover all possible information. If you have questions about this medicine, talk to your doctor, pharmacist, or health care provider.  2021 Elsevier/Gold Standard (2019-03-12 09:13:00)

## 2020-11-09 LAB — KAPPA/LAMBDA LIGHT CHAINS
Kappa free light chain: 25.7 mg/L — ABNORMAL HIGH (ref 3.3–19.4)
Kappa, lambda light chain ratio: 0.02 — ABNORMAL LOW (ref 0.26–1.65)
Lambda free light chains: 1371.7 mg/L — ABNORMAL HIGH (ref 5.7–26.3)

## 2020-11-09 LAB — IGG, IGA, IGM
IgA: 45 mg/dL — ABNORMAL LOW (ref 64–422)
IgG (Immunoglobin G), Serum: 541 mg/dL — ABNORMAL LOW (ref 586–1602)
IgM (Immunoglobulin M), Srm: 14 mg/dL — ABNORMAL LOW (ref 26–217)

## 2020-11-10 ENCOUNTER — Other Ambulatory Visit: Payer: Self-pay | Admitting: *Deleted

## 2020-11-10 DIAGNOSIS — C9000 Multiple myeloma not having achieved remission: Secondary | ICD-10-CM

## 2020-11-10 LAB — PROTEIN ELECTROPHORESIS, SERUM
A/G Ratio: 2.2 — ABNORMAL HIGH (ref 0.7–1.7)
Albumin ELP: 3.8 g/dL (ref 2.9–4.4)
Alpha-1-Globulin: 0.1 g/dL (ref 0.0–0.4)
Alpha-2-Globulin: 0.5 g/dL (ref 0.4–1.0)
Beta Globulin: 0.6 g/dL — ABNORMAL LOW (ref 0.7–1.3)
Gamma Globulin: 0.4 g/dL (ref 0.4–1.8)
Globulin, Total: 1.7 g/dL — ABNORMAL LOW (ref 2.2–3.9)
M-Spike, %: 0.1 g/dL — ABNORMAL HIGH
Total Protein ELP: 5.5 g/dL — ABNORMAL LOW (ref 6.0–8.5)

## 2020-11-10 MED ORDER — POMALIDOMIDE 2 MG PO CAPS: ORAL_CAPSULE | ORAL | 0 refills | Status: DC

## 2020-11-16 DIAGNOSIS — J301 Allergic rhinitis due to pollen: Secondary | ICD-10-CM | POA: Diagnosis not present

## 2020-11-25 DIAGNOSIS — J301 Allergic rhinitis due to pollen: Secondary | ICD-10-CM | POA: Diagnosis not present

## 2020-12-02 DIAGNOSIS — J301 Allergic rhinitis due to pollen: Secondary | ICD-10-CM | POA: Diagnosis not present

## 2020-12-03 ENCOUNTER — Other Ambulatory Visit: Payer: Self-pay | Admitting: Oncology

## 2020-12-03 DIAGNOSIS — C9000 Multiple myeloma not having achieved remission: Secondary | ICD-10-CM

## 2020-12-05 ENCOUNTER — Telehealth: Payer: Self-pay | Admitting: *Deleted

## 2020-12-05 ENCOUNTER — Other Ambulatory Visit: Payer: Self-pay

## 2020-12-05 DIAGNOSIS — C9000 Multiple myeloma not having achieved remission: Secondary | ICD-10-CM

## 2020-12-05 MED ORDER — POMALIDOMIDE 2 MG PO CAPS
ORAL_CAPSULE | ORAL | 0 refills | Status: DC
Start: 2020-12-05 — End: 2021-01-13

## 2020-12-05 NOTE — Progress Notes (Signed)
Resubmitting rx to include [employer] Authorization Number:  [id number] 12/05/20

## 2020-12-05 NOTE — Telephone Encounter (Signed)
Pomalyst prescription sent with no Auth #. Please resubmit with new auth #

## 2020-12-05 NOTE — Telephone Encounter (Signed)
Rx has been resubmitted to include [facility] Number: [id number] 12/05/20

## 2020-12-06 ENCOUNTER — Encounter: Payer: Medicare PPO | Admitting: Nurse Practitioner

## 2020-12-06 ENCOUNTER — Encounter: Payer: Self-pay | Admitting: Nurse Practitioner

## 2020-12-06 ENCOUNTER — Telehealth: Payer: Medicare PPO | Admitting: Nurse Practitioner

## 2020-12-06 VITALS — Temp 97.7°F | Resp 16 | Ht 62.0 in | Wt 124.0 lb

## 2020-12-06 DIAGNOSIS — R42 Dizziness and giddiness: Secondary | ICD-10-CM | POA: Diagnosis not present

## 2020-12-06 DIAGNOSIS — J0141 Acute recurrent pansinusitis: Secondary | ICD-10-CM

## 2020-12-06 MED ORDER — AMOXICILLIN-POT CLAVULANATE 875-125 MG PO TABS: 1.0000 | ORAL_TABLET | Freq: Two times a day (BID) | ORAL | 0 refills | Status: DC

## 2020-12-06 NOTE — Progress Notes (Deleted)
[facility] [place], [place] [address]  Internal MEDICINE  Telephone Visit  Patient Name: Rhonda [name]  [id number]  [id number]  Date of Service: 12/06/2020  I connected with the patient at 10:45 AM by telephone and verified the patients identity using two identifiers.   I discussed the limitations, risks, security and privacy concerns of performing an evaluation and management service by telephone and the availability of in person appointments. I also discussed with the patient that there may be a patient responsible charge related to the service.  The patient expressed understanding and agrees to proceed.    Chief Complaint  Patient presents with   Acute Visit    Sinus infection, pt woke up with this, dizzy, head clogged up, drainage in throat   Telephone Assessment    [phone]   Telephone Screen    Phone call    Quality Metric Gaps    shingrix    HPI     Current Medication: Outpatient Encounter Medications as of 12/06/2020  Medication Sig   aspirin EC 81 MG tablet Take 81 mg by mouth daily.   Cyanocobalamin (B-12 PO) Take 500 mcg by mouth daily.    EPINEPHrine 0.3 mg/0.3 mL IJ SOAJ injection    fexofenadine (ALLEGRA) 30 MG tablet Take 30 mg by mouth daily.    gabapentin (NEURONTIN) 300 MG capsule Take 1 capsule (300 mg total) by mouth 2 (two) times daily as needed (numbness & tingling).   levothyroxine (SYNTHROID) 50 MCG tablet TAKE 1 TABLET BY MOUTH DAILY BEFORE BREAKFAST   Multiple Vitamins-Minerals (CENTRUM SILVER PO) Take 1 tablet by mouth daily.   pomalidomide (POMALYST) 2 MG capsule TAKE 1 CAPSULE (2MG TOTAL) BY MOUTH EVERY DAY FOR 21 DAYS, THEN 7 DAYS OFF   triamcinolone (NASACORT) 55 MCG/ACT AERO nasal inhaler Place 2 sprays into the nose 2 (two) times daily as needed.   UNABLE TO FIND Inject 1 Dose as directed once a week. Allergy injections once a week   [DISCONTINUED] doxycycline (VIBRA-TABS) 100 MG tablet Take 2 tablets once  daily x 7 days   [DISCONTINUED] DULoxetine (CYMBALTA) 30 MG capsule Take 1 capsule (30 mg total) by mouth daily.   [DISCONTINUED] mupirocin ointment (BACTROBAN) 2 % Apply topically 2 (two) times daily.   No facility-administered encounter medications on file as of 12/06/2020.    Surgical History: Past Surgical History:  Procedure Laterality Date   BREAST EXCISIONAL BIOPSY Right 15+ yrs ago   EXCISIONAL - NEG   CATARACT EXTRACTION W/ INTRAOCULAR LENS IMPLANT     CHOLECYSTECTOMY N/A 03/14/2017   Procedure: LAPAROSCOPIC CHOLECYSTECTOMY;  Surgeon: Florene Glen, MD;  Location: [facility];  Service: General;  Laterality: N/A;   COLONOSCOPY WITH PROPOFOL N/A 02/21/2015   Procedure: COLONOSCOPY WITH PROPOFOL;  Surgeon: Lucilla Lame, MD;  Location: [place];  Service: Endoscopy;  Laterality: N/A;   [place]   POLYPECTOMY  02/21/2015   Procedure: POLYPECTOMY;  Surgeon: Lucilla Lame, MD;  Location: [place];  Service: Endoscopy;;    Medical History: Past Medical History:  Diagnosis Date   Actinic keratosis    Arthritis    hands   Benign neoplasm of ascending colon    Cataract    GERD (gastroesophageal reflux disease)    Hyperlipemia    Hypertension 01/31/2015   Hypothyroidism    Melanoma ([place]) 04/28/2018   R mid to distal ant lat thigh. MM, SS, tumor thickness 0.72m, antatomic level II. Excised: 05/13/18, margins  free   Multiple myeloma ([place]) 04/18/2016   Multiple myeloma in remission ([place]) 04/18/2016   Osteoporosis    Shingles    Skin cancer    basal cell   Squamous cell carcinoma of skin 02/04/2014   Left pretibial. KA-like pattern   Squamous cell carcinoma of skin 09/15/2015   Right lat. superior ankle are. Superficial infiltration. Tx: EDC   Squamous cell carcinoma of skin 10/15/2016   Left medial mid lower leg. Tx: EDC   Squamous cell carcinoma of skin 11/27/2018   Right dorsum proximal forearm.   Squamous cell carcinoma of skin  09/09/2019   Right cheek lat. to mid nasolabial area. Tx: EDC   Squamous cell carcinoma of skin 12/09/2019   right pretibial below knee    Family History: Family History  Problem Relation Age of Onset   Heart disease Mother    Stroke Father    Heart disease Brother    Kidney cancer Neg Hx    Prostate cancer Neg Hx    Bladder Cancer Neg Hx    Breast cancer Neg Hx     Social History   Socioeconomic History   Marital status: Widowed    Spouse name: Not on file   Number of children: Not on file   Years of education: Not on file   Highest education level: Not on file  Occupational History   Not on file  Tobacco Use   Smoking status: Never   Smokeless tobacco: Never  Vaping Use   Vaping Use: Never used  Substance and Sexual Activity   Alcohol use: No    Alcohol/week: 0.0 standard drinks   Drug use: No   Sexual activity: Not on file  Other Topics Concern   Not on file  Social History Narrative   Not on file   Social Determinants of Health   Financial Resource Strain: Not on file  Food Insecurity: Not on file  Transportation Needs: Not on file  Physical Activity: Not on file  Stress: Not on file  Social Connections: Not on file  Intimate Partner Violence: Not on file      Review of Systems  Constitutional:  Negative for chills, fatigue and fever.  HENT:  Positive for congestion, ear pain, postnasal drip, rhinorrhea, sinus pressure, sneezing and sore throat.   Respiratory:  Positive for cough. Negative for chest tightness, shortness of breath and wheezing.   Cardiovascular:  Negative for chest pain.  Musculoskeletal:  Negative for myalgias.   Vital Signs: Temp 97.7 F (36.5 C)   Resp 16   Ht _0  (1.575 m)   Wt 124 lb (56.2 kg)   BMI 22.68 kg/m    Observation/Objective:     Assessment/Plan:   General Counseling: Shekelia verbalizes understanding of the findings of today's phone visit and agrees with plan of treatment. I have discussed any further  diagnostic evaluation that may be needed or ordered today. We also reviewed her medications today. she has been encouraged to call the office with any questions or concerns that should arise related to todays visit.    No orders of the defined types were placed in this encounter.   No orders of the defined types were placed in this encounter.   Time spent:*** Minutes    Dr Lavera Guise Internal medicine

## 2020-12-06 NOTE — Progress Notes (Signed)
[facility] [place], [place] [address]  Internal MEDICINE  Telephone Visit  Patient Name: Rhonda [name]  [id number]  [id number]  Date of Service: 12/11/2020  I connected with the patient at 10:55 AM by telephone and verified the patients identity using two identifiers.   I discussed the limitations, risks, security and privacy concerns of performing an evaluation and management service by telephone and the availability of in person appointments. I also discussed with the patient that there may be a patient responsible charge related to the service.  The patient expressed understanding and agrees to proceed.    Chief Complaint  Patient presents with   Telephone Assessment   Telephone Screen   Sinusitis   Dizziness    HPI Talissa reports c/o sinus pain/pressure, nasal congestion, headache, and dizziness. She denies fever, chills, body aches, and cough.     Current Medication: Outpatient Encounter Medications as of 12/06/2020  Medication Sig   amoxicillin-clavulanate (AUGMENTIN) 875-125 MG tablet Take 1 tablet by mouth 2 (two) times daily.   aspirin EC 81 MG tablet Take 81 mg by mouth daily.   Cyanocobalamin (B-12 PO) Take 500 mcg by mouth daily.    EPINEPHrine 0.3 mg/0.3 mL IJ SOAJ injection    fexofenadine (ALLEGRA) 30 MG tablet Take 30 mg by mouth daily.    gabapentin (NEURONTIN) 300 MG capsule Take 1 capsule (300 mg total) by mouth 2 (two) times daily as needed (numbness & tingling).   levothyroxine (SYNTHROID) 50 MCG tablet TAKE 1 TABLET BY MOUTH DAILY BEFORE BREAKFAST   Multiple Vitamins-Minerals (CENTRUM SILVER PO) Take 1 tablet by mouth daily.   pomalidomide (POMALYST) 2 MG capsule TAKE 1 CAPSULE (2MG TOTAL) BY MOUTH EVERY DAY FOR 21 DAYS, THEN 7 DAYS OFF   triamcinolone (NASACORT) 55 MCG/ACT AERO nasal inhaler Place 2 sprays into the nose 2 (two) times daily as needed.   UNABLE TO FIND Inject 1 Dose as directed once a week. Allergy injections once  a week   No facility-administered encounter medications on file as of 12/06/2020.    Surgical History: Past Surgical History:  Procedure Laterality Date   BREAST EXCISIONAL BIOPSY Right 15+ yrs ago   EXCISIONAL - NEG   CATARACT EXTRACTION W/ INTRAOCULAR LENS IMPLANT     CHOLECYSTECTOMY N/A 03/14/2017   Procedure: LAPAROSCOPIC CHOLECYSTECTOMY;  Surgeon: Florene Glen, MD;  Location: [facility];  Service: General;  Laterality: N/A;   COLONOSCOPY WITH PROPOFOL N/A 02/21/2015   Procedure: COLONOSCOPY WITH PROPOFOL;  Surgeon: Lucilla Lame, MD;  Location: [place];  Service: Endoscopy;  Laterality: N/A;   [place]   POLYPECTOMY  02/21/2015   Procedure: POLYPECTOMY;  Surgeon: Lucilla Lame, MD;  Location: [place];  Service: Endoscopy;;    Medical History: Past Medical History:  Diagnosis Date   Actinic keratosis    Arthritis    hands   Benign neoplasm of ascending colon    Cataract    GERD (gastroesophageal reflux disease)    Hyperlipemia    Hypertension 01/31/2015   Hypothyroidism    Melanoma ([place]) 04/28/2018   R mid to distal ant lat thigh. MM, SS, tumor thickness 0.81m, antatomic level II. Excised: 05/13/18, margins free   [place] 04/18/2016   [place] in [place] ([place] 04/18/2016   Osteoporosis    Shingles    Skin cancer    basal cell   Squamous cell carcinoma of skin 02/04/2014   Left pretibial. KA-like pattern  Squamous cell carcinoma of skin 09/15/2015   Right lat. superior ankle are. Superficial infiltration. Tx: EDC   Squamous cell carcinoma of skin 10/15/2016   Left medial mid lower leg. Tx: EDC   Squamous cell carcinoma of skin 11/27/2018   Right dorsum proximal forearm.   Squamous cell carcinoma of skin 09/09/2019   Right cheek lat. to mid nasolabial area. Tx: EDC   Squamous cell carcinoma of skin 12/09/2019   right pretibial below knee    Family History: Family History  Problem Relation Age of  Onset   Heart disease Mother    Stroke Father    Heart disease Brother    Kidney cancer Neg Hx    Prostate cancer Neg Hx    Bladder Cancer Neg Hx    Breast cancer Neg Hx     Social History   Socioeconomic History   Marital status: Widowed    Spouse name: Not on file   Number of children: Not on file   Years of education: Not on file   Highest education level: Not on file  Occupational History   Not on file  Tobacco Use   Smoking status: Never   Smokeless tobacco: Never  Vaping Use   Vaping Use: Never used  Substance and Sexual Activity   Alcohol use: No    Alcohol/week: 0.0 standard drinks   Drug use: No   Sexual activity: Not on file  Other Topics Concern   Not on file  Social History Narrative   Not on file   Social Determinants of Health   Financial Resource Strain: Not on file  Food Insecurity: Not on file  Transportation Needs: Not on file  Physical Activity: Not on file  Stress: Not on file  Social Connections: Not on file  Intimate Partner Violence: Not on file      Review of Systems  Constitutional: Negative.  Negative for appetite change, chills, fatigue and fever.  HENT:  Positive for congestion, ear pain, hearing loss, postnasal drip, rhinorrhea, sinus pressure, sinus pain, sneezing and sore throat.   Respiratory: Negative.  Negative for cough, chest tightness, shortness of breath and wheezing.   Cardiovascular: Negative.  Negative for chest pain.  Gastrointestinal:  Negative for abdominal pain, diarrhea, nausea and vomiting.  Musculoskeletal:  Negative for myalgias.  Neurological:  Positive for dizziness and headaches.   Vital Signs: Temp 97.7 F (36.5 C)   Resp 16   Ht '5\' 2"'  (1.575 m)   Wt 124 lb (56.2 kg)   BMI 22.68 kg/m    Observation/Objective: She is alert and oriented. She sounds congested but is in no acute distress at this time.     Assessment/Plan: 1. Acute recurrent pansinusitis Lamont had a sinus infection a few months  ago. Augmentin prescribed for recurrent sinusitis.  - amoxicillin-clavulanate (AUGMENTIN) 875-125 MG tablet; Take 1 tablet by mouth 2 (two) times daily.  Dispense: 20 tablet; Refill: 0  2. Dizziness Experiencing dizziness most likely related to the involvement of ear pain and fullness she is experiencing wih the sinusitis. This should resolve with the sinusitis being treated.    General Counseling: Ishita verbalizes understanding of the findings of today's phone visit and agrees with plan of treatment. I have discussed any further diagnostic evaluation that may be needed or ordered today. We also reviewed her medications today. she has been encouraged to call the office with any questions or concerns that should arise related to todays visit.    No orders of the  defined types were placed in this encounter.   Meds ordered this encounter  Medications   amoxicillin-clavulanate (AUGMENTIN) 875-125 MG tablet    Sig: Take 1 tablet by mouth 2 (two) times daily.    Dispense:  20 tablet    Refill:  0    Time spent:30 Minutes  Return if symptoms worsen or fail to improve.  This patient was seen by Jonetta Osgood, FNP-C in collaboration with Dr. Clayborn Bigness as a part of collaborative care agreement.    Jalecia Leon R. Valetta Fuller, MSN, FNP-C Internal medicine

## 2020-12-16 DIAGNOSIS — J301 Allergic rhinitis due to pollen: Secondary | ICD-10-CM | POA: Diagnosis not present

## 2020-12-20 ENCOUNTER — Inpatient Hospital Stay: Payer: Medicare PPO | Attending: Oncology

## 2020-12-20 ENCOUNTER — Other Ambulatory Visit: Payer: Self-pay

## 2020-12-20 DIAGNOSIS — Z85820 Personal history of malignant melanoma of skin: Secondary | ICD-10-CM | POA: Insufficient documentation

## 2020-12-20 DIAGNOSIS — R5383 Other fatigue: Secondary | ICD-10-CM | POA: Diagnosis not present

## 2020-12-20 DIAGNOSIS — Z862 Personal history of diseases of the blood and blood-forming organs and certain disorders involving the immune mechanism: Secondary | ICD-10-CM | POA: Insufficient documentation

## 2020-12-20 DIAGNOSIS — C9000 Multiple myeloma not having achieved remission: Secondary | ICD-10-CM

## 2020-12-20 DIAGNOSIS — I129 Hypertensive chronic kidney disease with stage 1 through stage 4 chronic kidney disease, or unspecified chronic kidney disease: Secondary | ICD-10-CM | POA: Diagnosis not present

## 2020-12-20 DIAGNOSIS — C9002 Multiple myeloma in relapse: Secondary | ICD-10-CM | POA: Insufficient documentation

## 2020-12-20 DIAGNOSIS — H9203 Otalgia, bilateral: Secondary | ICD-10-CM | POA: Diagnosis not present

## 2020-12-20 DIAGNOSIS — N189 Chronic kidney disease, unspecified: Secondary | ICD-10-CM | POA: Diagnosis not present

## 2020-12-20 DIAGNOSIS — G629 Polyneuropathy, unspecified: Secondary | ICD-10-CM | POA: Diagnosis not present

## 2020-12-20 DIAGNOSIS — J301 Allergic rhinitis due to pollen: Secondary | ICD-10-CM | POA: Diagnosis not present

## 2020-12-20 DIAGNOSIS — H903 Sensorineural hearing loss, bilateral: Secondary | ICD-10-CM | POA: Diagnosis not present

## 2020-12-20 LAB — BASIC METABOLIC PANEL
Anion gap: 7 (ref 5–15)
BUN: 23 mg/dL (ref 8–23)
CO2: 23 mmol/L (ref 22–32)
Calcium: 9.2 mg/dL (ref 8.9–10.3)
Chloride: 107 mmol/L (ref 98–111)
Creatinine, Ser: 0.97 mg/dL (ref 0.44–1.00)
GFR, Estimated: 57 mL/min — ABNORMAL LOW (ref >60)
Glucose, Bld: 126 mg/dL — ABNORMAL HIGH (ref 70–99)
Potassium: 3.5 mmol/L (ref 3.5–5.1)
Sodium: 137 mmol/L (ref 135–145)

## 2020-12-20 LAB — CBC WITH DIFFERENTIAL/PLATELET
Abs Immature Granulocytes: 0 10*3/uL (ref 0.00–0.07)
Basophils Absolute: 0.1 10*3/uL (ref 0.0–0.1)
Basophils Relative: 2 %
Eosinophils Absolute: 0 10*3/uL (ref 0.0–0.5)
Eosinophils Relative: 1 %
HCT: 33.8 % — ABNORMAL LOW (ref 36.0–46.0)
Hemoglobin: 11.6 g/dL — ABNORMAL LOW (ref 12.0–15.0)
Immature Granulocytes: 0 %
Lymphocytes Relative: 32 %
Lymphs Abs: 1.2 10*3/uL (ref 0.7–4.0)
MCH: 32.8 pg (ref 26.0–34.0)
MCHC: 34.3 g/dL (ref 30.0–36.0)
MCV: 95.5 fL (ref 80.0–100.0)
Monocytes Absolute: 0.8 10*3/uL (ref 0.1–1.0)
Monocytes Relative: 21 %
Neutro Abs: 1.7 10*3/uL (ref 1.7–7.7)
Neutrophils Relative %: 44 %
Platelets: 201 10*3/uL (ref 150–400)
RBC: 3.54 MIL/uL — ABNORMAL LOW (ref 3.87–5.11)
RDW: 13.2 % (ref 11.5–15.5)
WBC: 3.8 10*3/uL — ABNORMAL LOW (ref 4.0–10.5)
nRBC: 0 % (ref 0.0–0.2)

## 2020-12-21 LAB — IGG, IGA, IGM
IgA: 36 mg/dL — ABNORMAL LOW (ref 64–422)
IgG (Immunoglobin G), Serum: 506 mg/dL — ABNORMAL LOW (ref 586–1602)
IgM (Immunoglobulin M), Srm: 11 mg/dL — ABNORMAL LOW (ref 26–217)

## 2020-12-21 LAB — KAPPA/LAMBDA LIGHT CHAINS
Kappa free light chain: 16.4 mg/L (ref 3.3–19.4)
Kappa, lambda light chain ratio: 0.02 — ABNORMAL LOW (ref 0.26–1.65)
Lambda free light chains: 1045.4 mg/L — ABNORMAL HIGH (ref 5.7–26.3)

## 2020-12-23 DIAGNOSIS — J301 Allergic rhinitis due to pollen: Secondary | ICD-10-CM | POA: Diagnosis not present

## 2020-12-23 LAB — PROTEIN ELECTROPHORESIS, SERUM
A/G Ratio: 1.8 — ABNORMAL HIGH (ref 0.7–1.7)
Albumin ELP: 3.6 g/dL (ref 2.9–4.4)
Alpha-1-Globulin: 0.2 g/dL (ref 0.0–0.4)
Alpha-2-Globulin: 0.6 g/dL (ref 0.4–1.0)
Beta Globulin: 0.7 g/dL (ref 0.7–1.3)
Gamma Globulin: 0.5 g/dL (ref 0.4–1.8)
Globulin, Total: 2 g/dL — ABNORMAL LOW (ref 2.2–3.9)
M-Spike, %: 0.1 g/dL — ABNORMAL HIGH
Total Protein ELP: 5.6 g/dL — ABNORMAL LOW (ref 6.0–8.5)

## 2020-12-26 ENCOUNTER — Ambulatory Visit: Payer: Medicare PPO | Admitting: Dermatology

## 2020-12-26 ENCOUNTER — Other Ambulatory Visit: Payer: Self-pay

## 2020-12-26 DIAGNOSIS — D1800 Hemangioma unspecified site: Secondary | ICD-10-CM

## 2020-12-26 DIAGNOSIS — L570 Actinic keratosis: Secondary | ICD-10-CM

## 2020-12-26 DIAGNOSIS — L821 Other seborrheic keratosis: Secondary | ICD-10-CM | POA: Diagnosis not present

## 2020-12-26 DIAGNOSIS — D229 Melanocytic nevi, unspecified: Secondary | ICD-10-CM | POA: Diagnosis not present

## 2020-12-26 DIAGNOSIS — Z1283 Encounter for screening for malignant neoplasm of skin: Secondary | ICD-10-CM

## 2020-12-26 DIAGNOSIS — L578 Other skin changes due to chronic exposure to nonionizing radiation: Secondary | ICD-10-CM

## 2020-12-26 DIAGNOSIS — Z85820 Personal history of malignant melanoma of skin: Secondary | ICD-10-CM

## 2020-12-26 DIAGNOSIS — L814 Other melanin hyperpigmentation: Secondary | ICD-10-CM

## 2020-12-26 NOTE — Progress Notes (Signed)
Follow-Up Visit   Subjective  Rhonda [name] is a 87 y.o. female who presents for the following: Annual Exam (6 months f/u mole check ). Hx of Melanoma R mid to distal ant lat thigh. MM, SS, tumor thickness 0.38mm removed 04/28/2018.  The patient presents for Total-Body Skin Exam (TBSE) for skin cancer screening and mole check.   The following portions of the chart were reviewed this encounter and updated as appropriate:   Tobacco  Allergies  Meds  Problems  Med Hx  Surg Hx  Fam Hx     Review of Systems:  No other skin or systemic complaints except as noted in HPI or Assessment and Plan.  Objective  Well appearing patient in no apparent distress; mood and affect are within normal limits.  A full examination was performed including scalp, head, eyes, ears, nose, lips, neck, chest, axillae, abdomen, back, buttocks, bilateral upper extremities, bilateral lower extremities, hands, feet, fingers, toes, fingernails, and toenails. All findings within normal limits unless otherwise noted below.  face x 4 (4) Erythematous thin papules/macules with gritty scale.   R mid to distal ant lat thigh. MM, SS, tumor thickness 0.87mm Well healed scar with no evidence of recurrence, no lymphadenopathy.    Assessment & Plan  AK (actinic keratosis) (4) face x 4  Actinic keratoses are precancerous spots that appear secondary to cumulative UV radiation exposure/sun exposure over time. They are chronic with expected duration over 1 year. A portion of actinic keratoses will progress to squamous cell carcinoma of the skin. It is not possible to reliably predict which spots will progress to skin cancer and so treatment is recommended to prevent development of skin cancer.  Recommend daily broad spectrum sunscreen SPF 30+ to sun-exposed areas, reapply every 2 hours as needed.  Recommend staying in the shade or wearing long sleeves, sun glasses (UVA+UVB protection) and wide brim hats (4-inch brim around  the entire circumference of the hat). Call for new or changing lesions.   Destruction of lesion - face x 4 Complexity: simple   Destruction method: cryotherapy   Informed consent: discussed and consent obtained   Timeout:  patient name, date of birth, surgical site, and procedure verified Lesion destroyed using liquid nitrogen: Yes   Region frozen until ice ball extended beyond lesion: Yes   Outcome: patient tolerated procedure well with no complications   Post-procedure details: wound care instructions given    History of melanoma R mid to distal ant lat thigh. MM, SS, tumor thickness 0.67mm  Clear. Observe for recurrence. Call clinic for new or changing lesions.  Recommend regular skin exams, daily broad-spectrum spf 30+ sunscreen use, and photoprotection.     Skin cancer screening  Lentigines - Scattered tan macules - Due to sun exposure - Benign-appering, observe - Recommend daily broad spectrum sunscreen SPF 30+ to sun-exposed areas, reapply every 2 hours as needed. - Call for any changes  Seborrheic Keratoses - Stuck-on, waxy, tan-brown papules and/or plaques  - Benign-appearing - Discussed benign etiology and prognosis. - Observe - Call for any changes  Melanocytic Nevi - Tan-brown and/or pink-flesh-colored symmetric macules and papules - Benign appearing on exam today - Observation - Call clinic for new or changing moles - Recommend daily use of broad spectrum spf 30+ sunscreen to sun-exposed areas.   Hemangiomas - Red papules - Discussed benign nature - Observe - Call for any changes  Actinic Damage - Chronic condition, secondary to cumulative UV/sun exposure - diffuse scaly erythematous macules with  underlying dyspigmentation - Recommend daily broad spectrum sunscreen SPF 30+ to sun-exposed areas, reapply every 2 hours as needed.  - Staying in the shade or wearing long sleeves, sun glasses (UVA+UVB protection) and wide brim hats (4-inch brim around the  entire circumference of the hat) are also recommended for sun protection.  - Call for new or changing lesions.  Skin cancer screening performed today.   Return in about 6 months (around 06/28/2021) for TBSE, Hx of Melanoma.  IMarye Round, CMA, am acting as scribe for Sarina Ser, MD .  Documentation: I have reviewed the above documentation for accuracy and completeness, and I agree with the above.  Sarina Ser, MD

## 2020-12-26 NOTE — Patient Instructions (Signed)

## 2020-12-28 ENCOUNTER — Encounter: Payer: Self-pay | Admitting: Dermatology

## 2020-12-29 DIAGNOSIS — J301 Allergic rhinitis due to pollen: Secondary | ICD-10-CM | POA: Diagnosis not present

## 2021-01-05 DIAGNOSIS — H903 Sensorineural hearing loss, bilateral: Secondary | ICD-10-CM | POA: Diagnosis not present

## 2021-01-06 DIAGNOSIS — J301 Allergic rhinitis due to pollen: Secondary | ICD-10-CM | POA: Diagnosis not present

## 2021-01-13 ENCOUNTER — Other Ambulatory Visit: Payer: Self-pay

## 2021-01-13 DIAGNOSIS — C9000 Multiple myeloma not having achieved remission: Secondary | ICD-10-CM

## 2021-01-13 MED ORDER — POMALIDOMIDE 2 MG PO CAPS: ORAL_CAPSULE | ORAL | 0 refills | Status: DC

## 2021-01-20 ENCOUNTER — Telehealth: Payer: Medicare PPO | Admitting: Physician Assistant

## 2021-01-20 ENCOUNTER — Encounter: Payer: Self-pay | Admitting: Physician Assistant

## 2021-01-20 VITALS — Temp 99.7°F | Ht 62.0 in | Wt 120.0 lb

## 2021-01-20 DIAGNOSIS — U071 COVID-19: Secondary | ICD-10-CM | POA: Diagnosis not present

## 2021-01-20 MED ORDER — NIRMATRELVIR/RITONAVIR (PAXLOVID)TABLET: 3.0000 | ORAL_TABLET | Freq: Two times a day (BID) | ORAL | 0 refills | Status: AC

## 2021-01-20 NOTE — Progress Notes (Signed)
[facility] [place], [place] [address]  Internal MEDICINE  Telephone Visit  Patient Name: Rhonda [name]  [id number]  [id number]  Date of Service: 01/24/2021  I connected with the patient at 11:29 by telephone and verified the patients identity using two identifiers.   I discussed the limitations, risks, security and privacy concerns of performing an evaluation and management service by telephone and the availability of in person appointments. I also discussed with the patient that there may be a patient responsible charge related to the service.  The patient expressed understanding and agrees to proceed.    Chief Complaint  Patient presents with   Telephone Assessment    [id number]   Telephone Screen    Covid positive today   Sinusitis   Sore Throat   Cough    HPI Pt is here for virtual sick visit -She tested positive for covid today. Symptoms started yesterday and daughter had covid so took a test yesterday and was negative. Retook it again today and was positive. She is coughing all the time and head is hurting and having body aches. Denies wheezing or SOB. She reports having reduced appetite and is staying well hydrated with water and pedialyte. She is having some congestion too. -tylenol, mucinex  Current Medication: Outpatient Encounter Medications as of 01/20/2021  Medication Sig   aspirin EC 81 MG tablet Take 81 mg by mouth daily.   Cyanocobalamin (B-12 PO) Take 500 mcg by mouth daily.    EPINEPHrine 0.3 mg/0.3 mL IJ SOAJ injection    fexofenadine (ALLEGRA) 30 MG tablet Take 30 mg by mouth daily.    gabapentin (NEURONTIN) 300 MG capsule Take 1 capsule (300 mg total) by mouth 2 (two) times daily as needed (numbness & tingling).   levothyroxine (SYNTHROID) 50 MCG tablet TAKE 1 TABLET BY MOUTH DAILY BEFORE BREAKFAST   Multiple Vitamins-Minerals (CENTRUM SILVER PO) Take 1 tablet by mouth daily.   nirmatrelvir/ritonavir EUA (PAXLOVID) TABS Take 3  tablets by mouth 2 (two) times daily for 5 days.   pomalidomide (POMALYST) 2 MG capsule TAKE 1 CAPSULE (2MG TOTAL) BY MOUTH EVERY DAY FOR 21 DAYS, THEN 7 DAYS OFF   triamcinolone (NASACORT) 55 MCG/ACT AERO nasal inhaler Place 2 sprays into the nose 2 (two) times daily as needed.   UNABLE TO FIND Inject 1 Dose as directed once a week. Allergy injections once a week   [DISCONTINUED] amoxicillin-clavulanate (AUGMENTIN) 875-125 MG tablet Take 1 tablet by mouth 2 (two) times daily.   No facility-administered encounter medications on file as of 01/20/2021.    Surgical History: Past Surgical History:  Procedure Laterality Date   BREAST EXCISIONAL BIOPSY Right 15+ yrs ago   EXCISIONAL - NEG   CATARACT EXTRACTION W/ INTRAOCULAR LENS IMPLANT     CHOLECYSTECTOMY N/A 03/14/2017   Procedure: LAPAROSCOPIC CHOLECYSTECTOMY;  Surgeon: Florene Glen, MD;  Location: [facility];  Service: General;  Laterality: N/A;   COLONOSCOPY WITH PROPOFOL N/A 02/21/2015   Procedure: COLONOSCOPY WITH PROPOFOL;  Surgeon: Lucilla Lame, MD;  Location: [place];  Service: Endoscopy;  Laterality: N/A;   [place]   POLYPECTOMY  02/21/2015   Procedure: POLYPECTOMY;  Surgeon: Lucilla Lame, MD;  Location: [place];  Service: Endoscopy;;    Medical History: Past Medical History:  Diagnosis Date   Actinic keratosis    Arthritis    hands   Benign neoplasm of ascending colon    Cataract    GERD (gastroesophageal reflux  disease)    Hyperlipemia    Hypertension 01/31/2015   Hypothyroidism    Melanoma ([place]) 04/28/2018   R mid to distal ant lat thigh. MM, SS, tumor thickness 0.32m, antatomic level II. Excised: 05/13/18, margins free   [place] 04/18[place]   [place] 04/18[place]2016   Osteoporosis    Shingles    Skin cancer    basal cell   Squamous cell carcinoma of skin 02/04/2014   Left pretibial. KA-like pattern   Squamous cell carcinoma of skin  09/15/2015   Right lat. superior ankle are. Superficial infiltration. Tx: EDC   Squamous cell carcinoma of skin 10/15/2016   Left medial mid lower leg. Tx: EDC   Squamous cell carcinoma of skin 11/27/2018   Right dorsum proximal forearm.   Squamous cell carcinoma of skin 09/09/2019   Right cheek lat. to mid nasolabial area. Tx: EDC   Squamous cell carcinoma of skin 12/09/2019   right pretibial below knee    Family History: Family History  Problem Relation Age of Onset   Heart disease Mother    Stroke Father    Heart disease Brother    Kidney cancer Neg Hx    Prostate cancer Neg Hx    Bladder Cancer Neg Hx    Breast cancer Neg Hx     Social History   Socioeconomic History   Marital status: Widowed    Spouse name: Not on file   Number of children: Not on file   Years of education: Not on file   Highest education level: Not on file  Occupational History   Not on file  Tobacco Use   Smoking status: Never   Smokeless tobacco: Never  Vaping Use   Vaping Use: Never used  Substance and Sexual Activity   Alcohol use: No    Alcohol/week: 0.0 standard drinks   Drug use: No   Sexual activity: Not on file  Other Topics Concern   Not on file  Social History Narrative   Not on file   Social Determinants of Health   Financial Resource Strain: Not on file  Food Insecurity: Not on file  Transportation Needs: Not on file  Physical Activity: Not on file  Stress: Not on file  Social Connections: Not on file  Intimate Partner Violence: Not on file      Review of Systems  Constitutional:  Positive for appetite change and fatigue. Negative for fever.  HENT:  Positive for congestion and postnasal drip. Negative for mouth sores.   Respiratory:  Positive for cough. Negative for shortness of breath and wheezing.   Cardiovascular:  Negative for chest pain.  Genitourinary:  Negative for flank pain.  Musculoskeletal:  Positive for myalgias.  Neurological:  Positive for  headaches.  Psychiatric/Behavioral: Negative.     Vital Signs: Temp 99.7 F (37.6 C)   Ht '5\' 2"'  (1.575 m)   Wt 120 lb (54.4 kg)   BMI 21.95 kg/m    Observation/Objective:  Pt is able to carry out conversation   Assessment/Plan: 1. Acute COVID-19 Will start on paxlovid. Educated to stay well hydrated and rest and may take tylenol and mucinex as needed. Patient will call or go to ED if any acute worsening. - nirmatrelvir/ritonavir EUA (PAXLOVID) TABS; Take 3 tablets by mouth 2 (two) times daily for 5 days.  Dispense: 30 tablet; Refill: 0   General Counseling: ABrittanyannverbalizes understanding of the findings of today's phone visit and agrees with plan of treatment. I  have discussed any further diagnostic evaluation that may be needed or ordered today. We also reviewed her medications today. she has been encouraged to call the office with any questions or concerns that should arise related to todays visit.    No orders of the defined types were placed in this encounter.   Meds ordered this encounter  Medications   nirmatrelvir/ritonavir EUA (PAXLOVID) TABS    Sig: Take 3 tablets by mouth 2 (two) times daily for 5 days.    Dispense:  30 tablet    Refill:  0    Time spent:25 Minutes    Dr Lavera Guise Internal medicine

## 2021-02-03 ENCOUNTER — Other Ambulatory Visit: Payer: Self-pay | Admitting: Oncology

## 2021-02-03 DIAGNOSIS — J301 Allergic rhinitis due to pollen: Secondary | ICD-10-CM | POA: Diagnosis not present

## 2021-02-03 DIAGNOSIS — C9000 Multiple myeloma not having achieved remission: Secondary | ICD-10-CM

## 2021-02-06 ENCOUNTER — Other Ambulatory Visit: Payer: Self-pay

## 2021-02-06 DIAGNOSIS — C9000 Multiple myeloma not having achieved remission: Secondary | ICD-10-CM

## 2021-02-06 MED ORDER — POMALIDOMIDE 2 MG PO CAPS: ORAL_CAPSULE | ORAL | 0 refills | Status: DC

## 2021-02-06 NOTE — Progress Notes (Signed)
Resending rx to include [employer] authorization.

## 2021-02-08 ENCOUNTER — Inpatient Hospital Stay: Payer: Medicare PPO | Attending: Oncology

## 2021-02-08 DIAGNOSIS — C9000 Multiple myeloma not having achieved remission: Secondary | ICD-10-CM

## 2021-02-08 DIAGNOSIS — I129 Hypertensive chronic kidney disease with stage 1 through stage 4 chronic kidney disease, or unspecified chronic kidney disease: Secondary | ICD-10-CM | POA: Diagnosis not present

## 2021-02-08 DIAGNOSIS — Z862 Personal history of diseases of the blood and blood-forming organs and certain disorders involving the immune mechanism: Secondary | ICD-10-CM | POA: Diagnosis not present

## 2021-02-08 DIAGNOSIS — Z85820 Personal history of malignant melanoma of skin: Secondary | ICD-10-CM | POA: Diagnosis not present

## 2021-02-08 DIAGNOSIS — J301 Allergic rhinitis due to pollen: Secondary | ICD-10-CM | POA: Diagnosis not present

## 2021-02-08 DIAGNOSIS — G629 Polyneuropathy, unspecified: Secondary | ICD-10-CM | POA: Insufficient documentation

## 2021-02-08 DIAGNOSIS — R5383 Other fatigue: Secondary | ICD-10-CM | POA: Diagnosis not present

## 2021-02-08 DIAGNOSIS — C9002 Multiple myeloma in relapse: Secondary | ICD-10-CM | POA: Insufficient documentation

## 2021-02-08 DIAGNOSIS — N189 Chronic kidney disease, unspecified: Secondary | ICD-10-CM | POA: Insufficient documentation

## 2021-02-08 LAB — CBC WITH DIFFERENTIAL/PLATELET
Abs Immature Granulocytes: 0.01 10*3/uL (ref 0.00–0.07)
Basophils Absolute: 0.1 10*3/uL (ref 0.0–0.1)
Basophils Relative: 2 %
Eosinophils Absolute: 0.1 10*3/uL (ref 0.0–0.5)
Eosinophils Relative: 3 %
HCT: 32 % — ABNORMAL LOW (ref 36.0–46.0)
Hemoglobin: 10.8 g/dL — ABNORMAL LOW (ref 12.0–15.0)
Immature Granulocytes: 0 %
Lymphocytes Relative: 29 %
Lymphs Abs: 1 10*3/uL (ref 0.7–4.0)
MCH: 33.4 pg (ref 26.0–34.0)
MCHC: 33.8 g/dL (ref 30.0–36.0)
MCV: 99.1 fL (ref 80.0–100.0)
Monocytes Absolute: 0.7 10*3/uL (ref 0.1–1.0)
Monocytes Relative: 21 %
Neutro Abs: 1.4 10*3/uL — ABNORMAL LOW (ref 1.7–7.7)
Neutrophils Relative %: 45 %
Platelets: 134 10*3/uL — ABNORMAL LOW (ref 150–400)
RBC: 3.23 MIL/uL — ABNORMAL LOW (ref 3.87–5.11)
RDW: 13.7 % (ref 11.5–15.5)
WBC: 3.2 10*3/uL — ABNORMAL LOW (ref 4.0–10.5)
nRBC: 0 % (ref 0.0–0.2)

## 2021-02-08 LAB — BASIC METABOLIC PANEL
Anion gap: 8 (ref 5–15)
BUN: 18 mg/dL (ref 8–23)
CO2: 20 mmol/L — ABNORMAL LOW (ref 22–32)
Calcium: 8.3 mg/dL — ABNORMAL LOW (ref 8.9–10.3)
Chloride: 107 mmol/L (ref 98–111)
Creatinine, Ser: 1.03 mg/dL — ABNORMAL HIGH (ref 0.44–1.00)
GFR, Estimated: 53 mL/min — ABNORMAL LOW (ref >60)
Glucose, Bld: 126 mg/dL — ABNORMAL HIGH (ref 70–99)
Potassium: 3.9 mmol/L (ref 3.5–5.1)
Sodium: 135 mmol/L (ref 135–145)

## 2021-02-09 LAB — KAPPA/LAMBDA LIGHT CHAINS
Kappa free light chain: 26.2 mg/L — ABNORMAL HIGH (ref 3.3–19.4)
Kappa, lambda light chain ratio: 0.03 — ABNORMAL LOW (ref 0.26–1.65)
Lambda free light chains: 885.8 mg/L — ABNORMAL HIGH (ref 5.7–26.3)

## 2021-02-09 LAB — IGG, IGA, IGM
IgA: 48 mg/dL — ABNORMAL LOW (ref 64–422)
IgG (Immunoglobin G), Serum: 516 mg/dL — ABNORMAL LOW (ref 586–1602)
IgM (Immunoglobulin M), Srm: 15 mg/dL — ABNORMAL LOW (ref 26–217)

## 2021-02-10 DIAGNOSIS — J301 Allergic rhinitis due to pollen: Secondary | ICD-10-CM | POA: Diagnosis not present

## 2021-02-10 NOTE — Progress Notes (Signed)
[place]  Telephone:([phone]) [phone] Fax:(336) [phone]  ID: Rhonda [name] OB: 08-26-1933  MR#: [id number]  KGU#:[id number]  Patient Care Team: Lavera Guise, MD as PCP - General (Internal Medicine) Lloyd Huger, MD as Consulting Physician (Oncology)   CHIEF COMPLAINT:  Multiple myeloma in relapse.  INTERVAL HISTORY: Patient returns to clinic today for further evaluation and continuation of Zometa and pomalidomide.  She continues to have chronic weakness and fatigue, but she remains active and this does not affect her day-to-day activity.  She also has a chronic peripheral neuropathy that is unchanged.  She otherwise feels well and is asymptomatic.  She has no other neurologic complaints. She denies any recent fevers or illnesses. She has a good appetite and denies weight loss.  She denies any chest [name], shortness of breath, cough, or hemoptysis.  She denies any nausea, vomiting, constipation, or diarrhea.  She has no melena or hematochezia.  She has no urinary complaints.  Patient offers no further specific complaints today.    REVIEW OF SYSTEMS:   Review of Systems  Constitutional:  Positive for malaise/fatigue. Negative for fever and weight loss.  HENT:  Negative for congestion and sore throat.   Respiratory: Negative.  Negative for cough and shortness of breath.   Cardiovascular: Negative.  Negative for chest [name], palpitations and leg swelling.  Gastrointestinal: Negative.  Negative for abdominal [name], blood in stool, constipation, diarrhea, melena, nausea and vomiting.  Genitourinary: Negative.  Negative for dysuria.  Musculoskeletal: Negative.  Negative for back [name].  Skin: Negative.  Negative for rash.  Neurological:  Positive for tingling, sensory change and weakness. Negative for focal weakness.  Psychiatric/Behavioral: Negative.  The patient is not nervous/anxious and does not have insomnia.    As per HPI. Otherwise, a complete review of systems  is negative.  PAST MEDICAL HISTORY: Past Medical History:  Diagnosis Date   Actinic keratosis    Arthritis    hands   Benign neoplasm of ascending colon    Cataract    GERD (gastroesophageal reflux disease)    Hyperlipemia    Hypertension 01/31/2015   Hypothyroidism    Melanoma ([place]) 04/28/2018   R mid to distal ant lat thigh. MM, SS, tumor thickness 0.81m, antatomic level II. Excised: 05/13/18, margins free   [place] myel[place] 04/18/2016   [place] in [place] ([place] 04/18/2016   Osteoporosis    Shingles    Skin cancer    basal cell   Squamous cell carcinoma of skin 02/04/2014   Left pretibial. KA-like pattern   Squamous cell carcinoma of skin 09/15/2015   Right lat. superior ankle are. Superficial infiltration. Tx: EDC   Squamous cell carcinoma of skin 10/15/2016   Left medial mid lower leg. Tx: EDC   Squamous cell carcinoma of skin 11/27/2018   Right dorsum proximal forearm.   Squamous cell carcinoma of skin 09/09/2019   Right cheek lat. to mid nasolabial area. Tx: EDC   Squamous cell carcinoma of skin 12/09/2019   right pretibial below knee    PAST SURGICAL HISTORY: Past Surgical History:  Procedure Laterality Date   BREAST EXCISIONAL BIOPSY Right 15+ yrs ago   EXCISIONAL - NEG   CATARACT EXTRACTION W/ INTRAOCULAR LENS IMPLANT     CHOLECYSTECTOMY N/A 03/14/2017   Procedure: LAPAROSCOPIC CHOLECYSTECTOMY;  Surgeon: CFlorene Glen MD;  Location: [facility];  Service: General;  Laterality: N/A;   COLONOSCOPY WITH PROPOFOL N/A 02/21/2015   Procedure: COLONOSCOPY WITH PROPOFOL;  Surgeon: DLucilla Lame  MD;  Location: [place];  Service: Endoscopy;  Laterality: N/A;   LAPAROSCOPIC HYSTERECTOMY  1980   POLYPECTOMY  02/21/2015   Procedure: POLYPECTOMY;  Surgeon: Lucilla Lame, MD;  Location: [place];  Service: Endoscopy;;    FAMILY HISTORY: Family History  Problem Relation Age of Onset   Heart disease Mother    Stroke Father    Heart  disease Brother    Kidney cancer Neg Hx    Prostate cancer Neg Hx    Bladder Cancer Neg Hx    Breast cancer Neg Hx     ADVANCED DIRECTIVES (Y/N):  N  HEALTH MAINTENANCE: Social History   Tobacco Use   Smoking status: Never   Smokeless tobacco: Never  Vaping Use   Vaping Use: Never used  Substance Use Topics   Alcohol use: No    Alcohol/week: 0.0 standard drinks   Drug use: No     Colonoscopy:  PAP:  Bone density:  Lipid panel:  Allergies  Allergen Reactions   Revlimid [Lenalidomide]     Current Outpatient Medications  Medication Sig Dispense Refill   aspirin EC 81 MG tablet Take 81 mg by mouth daily.     Cyanocobalamin (B-12 PO) Take 500 mcg by mouth daily.      fexofenadine (ALLEGRA) 30 MG tablet Take 30 mg by mouth daily.      gabapentin (NEURONTIN) 300 MG capsule Take 1 capsule (300 mg total) by mouth 2 (two) times daily as needed (numbness & tingling). 30 capsule 2   levothyroxine (SYNTHROID) 50 MCG tablet TAKE 1 TABLET BY MOUTH DAILY BEFORE BREAKFAST 90 tablet 3   Multiple Vitamins-Minerals (CENTRUM SILVER PO) Take 1 tablet by mouth daily.     pomalidomide (POMALYST) 2 MG capsule TAKE 1 CAPSULE BY MOUTH EVERY DAY FOR 21 DAYS, THEN 7 DAYS OFF 21 capsule 0   triamcinolone (NASACORT) 55 MCG/ACT AERO nasal inhaler Place 2 sprays into the nose 2 (two) times daily as needed.     EPINEPHrine 0.3 mg/0.3 mL IJ SOAJ injection      UNABLE TO FIND Inject 1 Dose as directed once a week. Allergy injections once a week     No current facility-administered medications for this visit.    OBJECTIVE: Vitals:   02/14/21 1304  BP: 130/68  Pulse: 74  Resp: 18  Temp: 99.9 F (37.7 C)  SpO2: 96%     Body mass index is 22.71 kg/m.    ECOG FS:0 - Asymptomatic  General: Well-developed, well-nourished, no acute distress. Eyes: Pink conjunctiva, anicteric sclera. HEENT: Normocephalic, moist mucous membranes. Lungs: No audible wheezing or coughing. Heart: Regular rate and  rhythm. Abdomen: Soft, nontender, no obvious distention. Musculoskeletal: No edema, cyanosis, or clubbing. Neuro: Alert, answering all questions appropriately. Cranial nerves grossly intact. Skin: No rashes or petechiae noted. Psych: Normal affect.   LAB RESULTS:  Lab Results  Component Value Date   NA 135 02/08/2021   K 3.9 02/08/2021   CL 107 02/08/2021   CO2 20 (L) 02/08/2021   GLUCOSE 126 (H) 02/08/2021   BUN 18 02/08/2021   CREATININE 1.03 (H) 02/08/2021   CALCIUM 8.3 (L) 02/08/2021   PROT 6.3 (L) 10/11/2020   ALBUMIN 3.9 10/11/2020   AST 22 10/11/2020   ALT 15 10/11/2020   ALKPHOS 75 10/11/2020   BILITOT 0.5 10/11/2020   GFRNONAA 53 (L) 02/08/2021   GFRAA 40 (L) 03/01/2020    Lab Results  Component Value Date   WBC 3.2 (L) 02/08/2021  NEUTROABS 1.4 (L) 02/08/2021   HGB 10.8 (L) 02/08/2021   HCT 32.0 (L) 02/08/2021   MCV 99.1 02/08/2021   PLT 134 (L) 02/08/2021   Lab Results  Component Value Date   TOTALPROTELP 5.2 (L) 02/08/2021   ALBUMINELP 3.2 02/08/2021   A1GS 0.2 02/08/2021   A2GS 0.6 02/08/2021   BETS 0.7 02/08/2021   GAMS 0.5 02/08/2021   MSPIKE Not Observed 02/08/2021   SPEI Comment 02/08/2021     STUDIES: No results found.  ASSESSMENT:  Multiple myeloma in relapse.  PLAN:    1.  Multiple myeloma in relapse: Bone marrow biopsy on April 09, 2016 revealed 44% plasma cells and normal cytogenetics. Given the results of her metastatic bone survey on March 12, 2016, her elevated lambda free chains, and hypercalcemia, patient fit the criteria for multiple myeloma and was initially treated with single agent Revlimid. Metastatic bone survey on February 12, 2019 revealed new small lesions in her calvarium.  Patient's most recent M spike is now 0.0 and her immunoglobulins remain decreased.  Lambda light chains continue to trend down and are now 885.  Patient could not tolerate Revlimid and was switched to pomalidomide 2 mg daily for 21 days with a  7-day break.  Patient completed 1 year of monthly treatment and now only received Zometa every 3 months.  Proceed with dose reduced Zometa today.  Return to clinic in 3 months for repeat laboratory work, further evaluation, and continuation of treatment. 2.  Chronic renal insufficiency: Resolved. 3.  Anemia: Hemoglobin has trended down slightly to 10.8, monitor. 4.  Leukopenia: Mild, monitor. 5.  Peripheral neuropathy: Chronic and unchanged.  Patient was previously given a referral to acupuncture which she said did not help much.  6.  Constipation: Patient does not complain of this today.  Continue OTC remedies as needed. 7.  Hypocalcemia: Patient previously had hypercalcemia.  Proceed with Zometa as above.   Patient expressed understanding and was in agreement with this plan. She also understands that She can call clinic at any time with any questions, concerns, or complaints.    Lloyd Huger, MD 02/16/21 6:26 AM

## 2021-02-14 ENCOUNTER — Inpatient Hospital Stay: Payer: Medicare PPO

## 2021-02-14 ENCOUNTER — Inpatient Hospital Stay: Payer: Medicare PPO | Attending: Oncology | Admitting: Oncology

## 2021-02-14 VITALS — BP 130/68 | HR 74 | Temp 99.9°F | Resp 18 | Wt 124.2 lb

## 2021-02-14 DIAGNOSIS — R5383 Other fatigue: Secondary | ICD-10-CM | POA: Diagnosis not present

## 2021-02-14 DIAGNOSIS — C9002 Multiple myeloma in relapse: Secondary | ICD-10-CM | POA: Insufficient documentation

## 2021-02-14 DIAGNOSIS — Z862 Personal history of diseases of the blood and blood-forming organs and certain disorders involving the immune mechanism: Secondary | ICD-10-CM | POA: Insufficient documentation

## 2021-02-14 DIAGNOSIS — I129 Hypertensive chronic kidney disease with stage 1 through stage 4 chronic kidney disease, or unspecified chronic kidney disease: Secondary | ICD-10-CM | POA: Insufficient documentation

## 2021-02-14 DIAGNOSIS — G629 Polyneuropathy, unspecified: Secondary | ICD-10-CM | POA: Insufficient documentation

## 2021-02-14 DIAGNOSIS — Z85820 Personal history of malignant melanoma of skin: Secondary | ICD-10-CM | POA: Insufficient documentation

## 2021-02-14 DIAGNOSIS — N189 Chronic kidney disease, unspecified: Secondary | ICD-10-CM | POA: Insufficient documentation

## 2021-02-14 DIAGNOSIS — C9000 Multiple myeloma not having achieved remission: Secondary | ICD-10-CM

## 2021-02-14 LAB — PROTEIN ELECTROPHORESIS, SERUM
A/G Ratio: 1.6 (ref 0.7–1.7)
Albumin ELP: 3.2 g/dL (ref 2.9–4.4)
Alpha-1-Globulin: 0.2 g/dL (ref 0.0–0.4)
Alpha-2-Globulin: 0.6 g/dL (ref 0.4–1.0)
Beta Globulin: 0.7 g/dL (ref 0.7–1.3)
Gamma Globulin: 0.5 g/dL (ref 0.4–1.8)
Globulin, Total: 2 g/dL — ABNORMAL LOW (ref 2.2–3.9)
Total Protein ELP: 5.2 g/dL — ABNORMAL LOW (ref 6.0–8.5)

## 2021-02-14 MED ORDER — SODIUM CHLORIDE 0.9 % IV SOLN
Freq: Once | INTRAVENOUS | Status: AC
  Filled 2021-02-14: qty 250

## 2021-02-14 MED ORDER — ZOLEDRONIC ACID 4 MG/5ML IV CONC
3.0000 mg | Freq: Once | INTRAVENOUS | Status: AC
  Administered 2021-02-14: 3 mg via INTRAVENOUS
  Filled 2021-02-14: qty 3.75

## 2021-02-14 NOTE — Progress Notes (Signed)
Scheduled for Zometa today. Current calcium level 8.3. Per Dr. Grayland Ormond, [place] to proceed with Zometa.

## 2021-02-16 ENCOUNTER — Encounter: Payer: Self-pay | Admitting: Oncology

## 2021-02-17 DIAGNOSIS — J301 Allergic rhinitis due to pollen: Secondary | ICD-10-CM | POA: Diagnosis not present

## 2021-02-21 DIAGNOSIS — H40153 Residual stage of open-angle glaucoma, bilateral: Secondary | ICD-10-CM | POA: Diagnosis not present

## 2021-02-24 DIAGNOSIS — J301 Allergic rhinitis due to pollen: Secondary | ICD-10-CM | POA: Diagnosis not present

## 2021-03-04 ENCOUNTER — Other Ambulatory Visit: Payer: Self-pay | Admitting: Oncology

## 2021-03-04 DIAGNOSIS — C9000 Multiple myeloma not having achieved remission: Secondary | ICD-10-CM

## 2021-03-07 ENCOUNTER — Other Ambulatory Visit: Payer: Self-pay | Admitting: Emergency Medicine

## 2021-03-07 DIAGNOSIS — C9000 Multiple myeloma not having achieved remission: Secondary | ICD-10-CM

## 2021-03-07 MED ORDER — POMALIDOMIDE 2 MG PO CAPS: ORAL_CAPSULE | ORAL | 0 refills | Status: DC

## 2021-03-07 NOTE — Telephone Encounter (Signed)
MD sent to local pharmacy. Resent to [place]

## 2021-03-10 DIAGNOSIS — J301 Allergic rhinitis due to pollen: Secondary | ICD-10-CM | POA: Diagnosis not present

## 2021-03-14 ENCOUNTER — Ambulatory Visit (INDEPENDENT_AMBULATORY_CARE_PROVIDER_SITE_OTHER): Payer: Medicare PPO | Admitting: Nurse Practitioner

## 2021-03-14 ENCOUNTER — Encounter: Payer: Self-pay | Admitting: Nurse Practitioner

## 2021-03-14 ENCOUNTER — Other Ambulatory Visit: Payer: Self-pay

## 2021-03-14 VITALS — BP 135/67 | HR 73 | Temp 98.4°F | Resp 16 | Ht 62.0 in | Wt 128.0 lb

## 2021-03-14 DIAGNOSIS — R300 Dysuria: Secondary | ICD-10-CM

## 2021-03-14 DIAGNOSIS — C9001 Multiple myeloma in remission: Secondary | ICD-10-CM

## 2021-03-14 DIAGNOSIS — I10 Essential (primary) hypertension: Secondary | ICD-10-CM | POA: Diagnosis not present

## 2021-03-14 DIAGNOSIS — K219 Gastro-esophageal reflux disease without esophagitis: Secondary | ICD-10-CM

## 2021-03-14 DIAGNOSIS — Z23 Encounter for immunization: Secondary | ICD-10-CM | POA: Diagnosis not present

## 2021-03-14 DIAGNOSIS — Z0001 Encounter for general adult medical examination with abnormal findings: Secondary | ICD-10-CM

## 2021-03-14 DIAGNOSIS — E782 Mixed hyperlipidemia: Secondary | ICD-10-CM

## 2021-03-14 DIAGNOSIS — Z1239 Encounter for other screening for malignant neoplasm of breast: Secondary | ICD-10-CM | POA: Diagnosis not present

## 2021-03-14 DIAGNOSIS — R053 Chronic cough: Secondary | ICD-10-CM | POA: Diagnosis not present

## 2021-03-14 DIAGNOSIS — U099 Post covid-19 condition, unspecified: Secondary | ICD-10-CM

## 2021-03-14 DIAGNOSIS — E039 Hypothyroidism, unspecified: Secondary | ICD-10-CM | POA: Diagnosis not present

## 2021-03-14 MED ORDER — LEVOTHYROXINE SODIUM 50 MCG PO TABS: ORAL_TABLET | ORAL | 3 refills | Status: DC

## 2021-03-14 MED ORDER — MONTELUKAST SODIUM 10 MG PO TABS: 10.0000 mg | ORAL_TABLET | Freq: Every day | ORAL | 3 refills | Status: DC

## 2021-03-14 MED ORDER — BENZONATATE 100 MG PO CAPS: 100.0000 mg | ORAL_CAPSULE | Freq: Two times a day (BID) | ORAL | 0 refills | Status: DC | PRN

## 2021-03-14 NOTE — Progress Notes (Signed)
[facility] [place], [place] [address]  Internal MEDICINE  Office Visit Note  Patient Name: Rhonda [name]  [id number]  [id number]  Date of Service: 03/14/2021  Chief Complaint  Patient presents with   Medicare Wellness   Gastroesophageal Reflux   Hyperlipidemia   Hypertension   Cough   HPI Rhonda [name] presents for an annual well visit and physical exam. she has multiple myeloma and continues to have regular lab draws. She is scheduled to have labs drawn in December this year. She also has hypertension, GERd, hypothyroidism, osteoporosisand hyperlipidemia. She has had 3 doses of COVID vaccine. She would like the flu vaccine. She had her BMD screening in 2014. She had her most recent mammogram in December 2021. She has declined clinical breast exam today. She wants to talk to her oncologist prior to ordering another mammogram and and declined repeating the BMD screening. She denies any pain today.  She has had a cough x2 months ever since she had covid. She also reports nasal drainage, post nasal drip, and runny nose. She takes allegra 30 mg daily OTC and nasocort nasal spray.    Current Medication: Outpatient Encounter Medications as of 03/14/2021  Medication Sig   aspirin EC 81 MG tablet Take 81 mg by mouth daily.   benzonatate (TESSALON) 100 MG capsule Take 1 capsule (100 mg total) by mouth 2 (two) times daily as needed for cough.   Cyanocobalamin (B-12 PO) Take 500 mcg by mouth daily.    EPINEPHrine 0.3 mg/0.3 mL IJ SOAJ injection    fexofenadine (ALLEGRA) 30 MG tablet Take 30 mg by mouth daily.    montelukast (SINGULAIR) 10 MG tablet Take 1 tablet (10 mg total) by mouth at bedtime.   Multiple Vitamins-Minerals (CENTRUM SILVER PO) Take 1 tablet by mouth daily.   pomalidomide (POMALYST) 2 MG capsule TAKE 1 CAPSULE BY MOUTH EVERY DAY FOR 21 DAYS, THEN 7 DAYS OFF   triamcinolone (NASACORT) 55 MCG/ACT AERO nasal inhaler Place 2 sprays into the nose 2 (two) times  daily as needed.   UNABLE TO FIND Inject 1 Dose as directed once a week. Allergy injections once a week   [DISCONTINUED] levothyroxine (SYNTHROID) 50 MCG tablet TAKE 1 TABLET BY MOUTH DAILY BEFORE BREAKFAST   levothyroxine (SYNTHROID) 50 MCG tablet TAKE 1 TABLET BY MOUTH DAILY BEFORE BREAKFAST   [DISCONTINUED] gabapentin (NEURONTIN) 300 MG capsule Take 1 capsule (300 mg total) by mouth 2 (two) times daily as needed (numbness & tingling). (Patient not taking: Reported on 03/14/2021)   No facility-administered encounter medications on file as of 03/14/2021.    Surgical History: Past Surgical History:  Procedure Laterality Date   BREAST EXCISIONAL BIOPSY Right 15+ yrs ago   EXCISIONAL - NEG   CATARACT EXTRACTION W/ INTRAOCULAR LENS IMPLANT     CHOLECYSTECTOMY N/A 03/14/2017   Procedure: LAPAROSCOPIC CHOLECYSTECTOMY;  Surgeon: Florene Glen, MD;  Location: [facility];  Service: General;  Laterality: N/A;   COLONOSCOPY WITH PROPOFOL N/A 02/21/2015   Procedure: COLONOSCOPY WITH PROPOFOL;  Surgeon: Lucilla Lame, MD;  Location: [place];  Service: Endoscopy;  Laterality: N/A;   [place]   POLYPECTOMY  02/21/2015   Procedure: POLYPECTOMY;  Surgeon: Lucilla Lame, MD;  Location: [place];  Service: Endoscopy;;    Medical History: Past Medical History:  Diagnosis Date   Actinic keratosis    Arthritis    hands   Benign neoplasm of ascending colon    Cataract  GERD (gastroesophageal reflux disease)    Hyperlipemia    Hypertension 01/31/2015   Hypothyroidism    Melanoma ([place]) 04/28/2018   R mid to distal ant lat thigh. MM, SS, tumor thickness 0.80m, antatomic level II. Excised: 05/13/18, margins free   [place] 04[place]18[place]   [place] myel[place] in [place] 04/18/2016   Osteoporosis    Shingles    Skin cancer    basal cell   Squamous cell carcinoma of skin 02/04/2014   Left pretibial. KA-like pattern   Squamous cell carcinoma of  skin 09/15/2015   Right lat. superior ankle are. Superficial infiltration. Tx: EDC   Squamous cell carcinoma of skin 10/15/2016   Left medial mid lower leg. Tx: EDC   Squamous cell carcinoma of skin 11/27/2018   Right dorsum proximal forearm.   Squamous cell carcinoma of skin 09/09/2019   Right cheek lat. to mid nasolabial area. Tx: EDC   Squamous cell carcinoma of skin 12/09/2019   right pretibial below knee    Family History: Family History  Problem Relation Age of Onset   Heart disease Mother    Stroke Father    Heart disease Brother    Kidney cancer Neg Hx    Prostate cancer Neg Hx    Bladder Cancer Neg Hx    Breast cancer Neg Hx     Social History   Socioeconomic History   Marital status: Widowed    Spouse name: Not on file   Number of children: Not on file   Years of education: Not on file   Highest education level: Not on file  Occupational History   Not on file  Tobacco Use   Smoking status: Never   Smokeless tobacco: Never  Vaping Use   Vaping Use: Never used  Substance and Sexual Activity   Alcohol use: No    Alcohol/week: 0.0 standard drinks   Drug use: No   Sexual activity: Not on file  Other Topics Concern   Not on file  Social History Narrative   Not on file   Social Determinants of Health   Financial Resource Strain: Not on file  Food Insecurity: Not on file  Transportation Needs: Not on file  Physical Activity: Not on file  Stress: Not on file  Social Connections: Not on file  Intimate Partner Violence: Not on file      Review of Systems  Constitutional:  Negative for activity change, appetite change, chills, fatigue, fever and unexpected weight change.  HENT: Negative.  Negative for congestion, ear pain, rhinorrhea, sore throat and trouble swallowing.   Eyes: Negative.   Respiratory:  Positive for cough. Negative for chest tightness, shortness of breath and wheezing.   Cardiovascular: Negative.  Negative for chest pain.   Gastrointestinal: Negative.  Negative for abdominal pain, blood in stool, constipation, diarrhea, nausea and vomiting.  Endocrine: Negative.   Genitourinary: Negative.  Negative for difficulty urinating, dysuria, frequency, hematuria and urgency.  Musculoskeletal: Negative.  Negative for arthralgias, back pain, joint swelling, myalgias and neck pain.  Skin: Negative.  Negative for rash and wound.  Allergic/Immunologic: Negative.  Negative for immunocompromised state.  Neurological: Negative.  Negative for dizziness, seizures, numbness and headaches.  Hematological: Negative.   Psychiatric/Behavioral: Negative.  Negative for behavioral problems, self-injury and suicidal ideas. The patient is not nervous/anxious.    Vital Signs: BP 135/67   Pulse 73   Temp 98.4 F (36.9 C)   Resp 16   Ht '5\' 2"'  (1.575 m)  Wt 128 lb (58.1 kg)   SpO2 98%   BMI 23.41 kg/m    Physical Exam Vitals reviewed.  Constitutional:      General: She is not in acute distress.    Appearance: She is well-developed. She is not diaphoretic.  HENT:     Head: Normocephalic and atraumatic.     Right Ear: External ear normal.     Left Ear: External ear normal.     Nose: Nose normal.     Mouth/Throat:     Pharynx: No oropharyngeal exudate.  Eyes:     General: No scleral icterus.       Right eye: No discharge.        Left eye: No discharge.     Conjunctiva/sclera: Conjunctivae normal.     Pupils: Pupils are equal, round, and reactive to light.  Neck:     Thyroid: No thyromegaly.     Vascular: No JVD.     Trachea: No tracheal deviation.  Cardiovascular:     Rate and Rhythm: Normal rate and regular rhythm.     Heart sounds: Normal heart sounds. No murmur heard.   No friction rub. No gallop.  Pulmonary:     Effort: Pulmonary effort is normal. No respiratory distress.     Breath sounds: Normal breath sounds. No stridor. No wheezing or rales.  Chest:     Chest wall: No tenderness.  Abdominal:     General:  Bowel sounds are normal. There is no distension.     Palpations: Abdomen is soft. There is no mass.     Tenderness: There is no abdominal tenderness. There is no guarding or rebound.  Musculoskeletal:        General: No tenderness or deformity. Normal range of motion.     Cervical back: Normal range of motion and neck supple.  Lymphadenopathy:     Cervical: No cervical adenopathy.  Skin:    General: Skin is warm and dry.     Coloration: Skin is not pale.     Findings: No erythema or rash.  Neurological:     Mental Status: She is alert.     Cranial Nerves: No cranial nerve deficit.     Motor: No abnormal muscle tone.     Coordination: Coordination normal.     Deep Tendon Reflexes: Reflexes are normal and symmetric.  Psychiatric:        Behavior: Behavior normal.        Thought Content: Thought content normal.        Judgment: Judgment normal.       Assessment/Plan: 1. Encounter for general adult medical examination with abnormal findings Age-appropriate preventive screenings and vaccinations discussed, annual physical exam completed. Routine labs for health maintenance ordered, see below. PHM updated. Declined breast exam and mammogram for now.   2. Post-COVID chronic cough Chest xray ordered to rule out acute abnormalities, benzonatate and montelukast ordered.  - DG Chest 2 View; Future - benzonatate (TESSALON) 100 MG capsule; Take 1 capsule (100 mg total) by mouth 2 (two) times daily as needed for cough.  Dispense: 30 capsule; Refill: 0 - montelukast (SINGULAIR) 10 MG tablet; Take 1 tablet (10 mg total) by mouth at bedtime.  Dispense: 30 tablet; Refill: 3  3. Acquired hypothyroidism Refill ordered of levothyroxine. Lab ordered and given to patient to take to December appt with oncologist to add on lab to lab draw - levothyroxine (SYNTHROID) 50 MCG tablet; TAKE 1 TABLET BY MOUTH DAILY BEFORE BREAKFAST  Dispense: 90 tablet; Refill: 3 - TSH + free T4  4. Essential  hypertension Stable, not current on antihypertensive medication  5. Gastroesophageal reflux disease without esophagitis Stable, not on any medications at this time.   6. Multiple myeloma in remission ([place]) Followed by oncology, currently stable, gets labs drawn every few months.   7. Screening for breast cancer Declined clinical breast exam and mammogram, wants to discuss with oncologist in December.  8. Mixed hyperlipidemia No recent lipid panel, lab ordered, paper copy provided to patient to take to her appointment with oncology in December to add on the lab.  - Lipid Profile  9. Needs flu shot Administered in office today - Flu Vaccine MDCK QUAD PF     General Counseling: Rhonda [name] verbalizes understanding of the findings of todays visit and agrees with plan of treatment. I have discussed any further diagnostic evaluation that may be needed or ordered today. We also reviewed her medications today. she has been encouraged to call the office with any questions or concerns that should arise related to todays visit.    Orders Placed This Encounter  Procedures   DG Chest 2 View   Flu Vaccine MDCK QUAD PF   Lipid Profile   TSH + free T4    Meds ordered this encounter  Medications   benzonatate (TESSALON) 100 MG capsule    Sig: Take 1 capsule (100 mg total) by mouth 2 (two) times daily as needed for cough.    Dispense:  30 capsule    Refill:  0   montelukast (SINGULAIR) 10 MG tablet    Sig: Take 1 tablet (10 mg total) by mouth at bedtime.    Dispense:  30 tablet    Refill:  3   levothyroxine (SYNTHROID) 50 MCG tablet    Sig: TAKE 1 TABLET BY MOUTH DAILY BEFORE BREAKFAST    Dispense:  90 tablet    Refill:  3    Return in about 1 year (around 03/14/2022) for CPE, Teresita Fanton PCP.   Total time spent:30 Minutes Time spent includes review of chart, medications, test results, and follow up plan with the patient.   [place] Controlled Substance Database was reviewed by me.  This  patient was seen by Jonetta Osgood, FNP-C in collaboration with Dr. Clayborn Bigness as a part of collaborative care agreement.  Jazzmin Newbold R. Valetta Fuller, MSN, FNP-C Internal medicine

## 2021-03-15 LAB — UA/M W/RFLX CULTURE, ROUTINE
Bilirubin, UA: NEGATIVE
Glucose, UA: NEGATIVE
Ketones, UA: NEGATIVE
Leukocytes,UA: NEGATIVE
Nitrite, UA: NEGATIVE
Specific Gravity, UA: 1.011 (ref 1.005–1.030)
Urobilinogen, Ur: 0.2 mg/dL (ref 0.2–1.0)
pH, UA: 6 (ref 5.0–7.5)

## 2021-03-15 LAB — MICROSCOPIC EXAMINATION
Bacteria, UA: NONE SEEN
Casts: NONE SEEN /lpf
Epithelial Cells (non renal): NONE SEEN /hpf (ref 0–10)
RBC, Urine: NONE SEEN /hpf (ref 0–2)
WBC, UA: NONE SEEN /hpf (ref 0–5)

## 2021-03-20 ENCOUNTER — Ambulatory Visit
Admission: RE | Admit: 2021-03-20 | Discharge: 2021-03-20 | Disposition: A | Discharge status: Home / Self Care | Payer: Medicare PPO | Source: Ambulatory Visit | Attending: Nurse Practitioner | Admitting: Nurse Practitioner

## 2021-03-20 ENCOUNTER — Ambulatory Visit
Admission: RE | Admit: 2021-03-20 | Discharge: 2021-03-20 | Disposition: A | Discharge status: Home / Self Care | Payer: Medicare PPO | Attending: Nurse Practitioner | Admitting: Nurse Practitioner

## 2021-03-20 DIAGNOSIS — U099 Post covid-19 condition, unspecified: Secondary | ICD-10-CM

## 2021-03-20 DIAGNOSIS — Z9049 Acquired absence of other specified parts of digestive tract: Secondary | ICD-10-CM | POA: Diagnosis not present

## 2021-03-20 DIAGNOSIS — R053 Chronic cough: Secondary | ICD-10-CM | POA: Insufficient documentation

## 2021-03-20 DIAGNOSIS — R918 Other nonspecific abnormal finding of lung field: Secondary | ICD-10-CM | POA: Diagnosis not present

## 2021-03-20 DIAGNOSIS — I700 Atherosclerosis of aorta: Secondary | ICD-10-CM | POA: Diagnosis not present

## 2021-03-20 DIAGNOSIS — R059 Cough, unspecified: Secondary | ICD-10-CM | POA: Diagnosis not present

## 2021-03-24 DIAGNOSIS — J301 Allergic rhinitis due to pollen: Secondary | ICD-10-CM | POA: Diagnosis not present

## 2021-03-25 ENCOUNTER — Other Ambulatory Visit: Payer: Self-pay | Admitting: Oncology

## 2021-03-25 DIAGNOSIS — C9000 Multiple myeloma not having achieved remission: Secondary | ICD-10-CM

## 2021-03-27 NOTE — Telephone Encounter (Signed)
[employer] auth obtained on 9/27 expires on 10/27.  Prescriber survey not available yet.

## 2021-04-04 ENCOUNTER — Encounter: Payer: Self-pay | Admitting: Nurse Practitioner

## 2021-04-07 DIAGNOSIS — J301 Allergic rhinitis due to pollen: Secondary | ICD-10-CM | POA: Diagnosis not present

## 2021-04-14 DIAGNOSIS — J301 Allergic rhinitis due to pollen: Secondary | ICD-10-CM | POA: Diagnosis not present

## 2021-04-23 ENCOUNTER — Other Ambulatory Visit: Payer: Self-pay | Admitting: Oncology

## 2021-04-23 DIAGNOSIS — C9000 Multiple myeloma not having achieved remission: Secondary | ICD-10-CM

## 2021-04-24 ENCOUNTER — Encounter: Payer: Self-pay | Admitting: Oncology

## 2021-04-26 ENCOUNTER — Telehealth (INDEPENDENT_AMBULATORY_CARE_PROVIDER_SITE_OTHER): Payer: Medicare PPO | Admitting: Nurse Practitioner

## 2021-04-26 ENCOUNTER — Other Ambulatory Visit: Payer: Self-pay

## 2021-04-26 ENCOUNTER — Encounter: Payer: Self-pay | Admitting: Nurse Practitioner

## 2021-04-26 VITALS — Temp 98.7°F | Ht 62.0 in | Wt 123.0 lb

## 2021-04-26 DIAGNOSIS — R053 Chronic cough: Secondary | ICD-10-CM

## 2021-04-26 DIAGNOSIS — U099 Post covid-19 condition, unspecified: Secondary | ICD-10-CM

## 2021-04-26 DIAGNOSIS — R062 Wheezing: Secondary | ICD-10-CM

## 2021-04-26 DIAGNOSIS — J0141 Acute recurrent pansinusitis: Secondary | ICD-10-CM | POA: Diagnosis not present

## 2021-04-26 MED ORDER — BENZONATATE 100 MG PO CAPS: 100.0000 mg | ORAL_CAPSULE | Freq: Two times a day (BID) | ORAL | 1 refills | Status: DC | PRN

## 2021-04-26 MED ORDER — PREDNISONE 10 MG PO TABS: ORAL_TABLET | ORAL | 0 refills | Status: DC

## 2021-04-26 MED ORDER — AMOXICILLIN-POT CLAVULANATE 875-125 MG PO TABS: 1.0000 | ORAL_TABLET | Freq: Two times a day (BID) | ORAL | 0 refills | Status: AC

## 2021-04-26 NOTE — Progress Notes (Signed)
[facility] [place], [place] [address]  Internal MEDICINE  Telephone Visit  Patient Name: Rhonda [name]  [id number]  [id number]  Date of Service: 04/26/2021  I connected with the patient at 3:35 PM by telephone and verified the patients identity using two identifiers.   I discussed the limitations, risks, security and privacy concerns of performing an evaluation and management service by telephone and the availability of in person appointments. I also discussed with the patient that there may be a patient responsible charge related to the service.  The patient expressed understanding and agrees to proceed.    Chief Complaint  Patient presents with   Telephone Screen    Covid test negative   Telephone Assessment    [id number]   Sinusitis   Cough    HPI Rhonda [name] presents for a telehealth virtual visit for possible symptoms of sinusitis. She had a sinus infection in June and was given augmentin which resolved the infection then. She reports cough, wheezing, and SOB, sinus pressure, sore throat and chest tightness. She reports that she had chills and a fever last night and she feels like she had no energy. She denies any body aches, headache, nausea, vomiting or diarrhea.    Current Medication: Outpatient Encounter Medications as of 04/26/2021  Medication Sig   amoxicillin-clavulanate (AUGMENTIN) 875-125 MG tablet Take 1 tablet by mouth 2 (two) times daily for 14 days.   aspirin EC 81 MG tablet Take 81 mg by mouth daily.   Cyanocobalamin (B-12 PO) Take 500 mcg by mouth daily.    EPINEPHrine 0.3 mg/0.3 mL IJ SOAJ injection    fexofenadine (ALLEGRA) 30 MG tablet Take 30 mg by mouth daily.    levothyroxine (SYNTHROID) 50 MCG tablet TAKE 1 TABLET BY MOUTH DAILY BEFORE BREAKFAST   montelukast (SINGULAIR) 10 MG tablet Take 1 tablet (10 mg total) by mouth at bedtime.   Multiple Vitamins-Minerals (CENTRUM SILVER PO) Take 1 tablet by mouth daily.   POMALYST 2 MG  capsule TAKE 1 CAPSULE BY MOUTH EVERY DAY FOR 21 DAYS, THEN 7 DAYS OFF   predniSONE (DELTASONE) 10 MG tablet Take one tab 3 x day for 3 days, then take one tab 2 x a day for 3 days and then take one tab a day for 3 days for URI   triamcinolone (NASACORT) 55 MCG/ACT AERO nasal inhaler Place 2 sprays into the nose 2 (two) times daily as needed.   UNABLE TO FIND Inject 1 Dose as directed once a week. Allergy injections once a week   [DISCONTINUED] benzonatate (TESSALON) 100 MG capsule Take 1 capsule (100 mg total) by mouth 2 (two) times daily as needed for cough.   benzonatate (TESSALON) 100 MG capsule Take 1 capsule (100 mg total) by mouth 2 (two) times daily as needed for cough.   No facility-administered encounter medications on file as of 04/26/2021.    Surgical History: Past Surgical History:  Procedure Laterality Date   BREAST EXCISIONAL BIOPSY Right 15+ yrs ago   EXCISIONAL - NEG   CATARACT EXTRACTION W/ INTRAOCULAR LENS IMPLANT     CHOLECYSTECTOMY N/A 03/14/2017   Procedure: LAPAROSCOPIC CHOLECYSTECTOMY;  Surgeon: Florene Glen, MD;  Location: [facility];  Service: General;  Laterality: N/A;   COLONOSCOPY WITH PROPOFOL N/A 02/21/2015   Procedure: COLONOSCOPY WITH PROPOFOL;  Surgeon: Lucilla Lame, MD;  Location: [facility];  Service: Endoscopy;  Laterality: N/A;   [place]   POLYPECTOMY  02/21/2015   Procedure:  POLYPECTOMY;  Surgeon: Lucilla Lame, MD;  Location: [facility] [place];  Service: Endoscopy;;    Medical History: Past Medical History:  Diagnosis Date   Actinic keratosis    Arthritis    hands   Benign neoplasm of ascending colon    Cataract    GERD (gastroesophageal reflux disease)    Hyperlipemia    Hypertension 01/31/2015   Hypothyroidism    Melanoma ([place]) 04/28/2018   R mid to distal ant lat thigh. MM, SS, tumor thickness 0.65m, antatomic level II. Excised: 05/13/18, margins free   [place] 04/18/2016   [place]  [place] 04/18/2016   Osteoporosis    Shingles    Skin cancer    basal cell   Squamous cell carcinoma of skin 02/04/2014   Left pretibial. KA-like pattern   Squamous cell carcinoma of skin 09/15/2015   Right lat. superior ankle are. Superficial infiltration. Tx: EDC   Squamous cell carcinoma of skin 10/15/2016   Left medial mid lower leg. Tx: EDC   Squamous cell carcinoma of skin 11/27/2018   Right dorsum proximal forearm.   Squamous cell carcinoma of skin 09/09/2019   Right cheek lat. to mid nasolabial area. Tx: EDC   Squamous cell carcinoma of skin 12/09/2019   right pretibial below knee    Family History: Family History  Problem Relation Age of Onset   Heart disease Mother    Stroke Father    Heart disease Brother    Kidney cancer Neg Hx    Prostate cancer Neg Hx    Bladder Cancer Neg Hx    Breast cancer Neg Hx     Social History   Socioeconomic History   Marital status: Widowed    Spouse name: Not on file   Number of children: Not on file   Years of education: Not on file   Highest education level: Not on file  Occupational History   Not on file  Tobacco Use   Smoking status: Never   Smokeless tobacco: Never  Vaping Use   Vaping Use: Never used  Substance and Sexual Activity   Alcohol use: No    Alcohol/week: 0.0 standard drinks   Drug use: No   Sexual activity: Not on file  Other Topics Concern   Not on file  Social History Narrative   Not on file   Social Determinants of Health   Financial Resource Strain: Not on file  Food Insecurity: Not on file  Transportation Needs: Not on file  Physical Activity: Not on file  Stress: Not on file  Social Connections: Not on file  Intimate Partner Violence: Not on file      Review of Systems  Constitutional:  Positive for chills, fatigue and fever.  HENT:  Positive for congestion, postnasal drip, rhinorrhea, sinus pressure, sinus pain and sore throat. Negative for sneezing.   Respiratory:   Positive for cough, chest tightness, shortness of breath and wheezing.   Cardiovascular: Negative.  Negative for chest pain and palpitations.  Gastrointestinal:  Negative for abdominal pain, constipation, diarrhea, nausea and vomiting.  Musculoskeletal:  Positive for myalgias.  Neurological:  Negative for dizziness, light-headedness and headaches.   Vital Signs: Temp 98.7 F (37.1 C)   Ht '5\' 2"'  (1.575 m)   Wt 123 lb (55.8 kg)   BMI 22.50 kg/m    Observation/Objective: She is alert and oriented, engages in conversation appropriately. She does not appear to be in any acute distress over video call.  Assessment/Plan: 1. Acute recurrent pansinusitis Empiric antibiotic treatment prescribed - amoxicillin-clavulanate (AUGMENTIN) 875-125 MG tablet; Take 1 tablet by mouth 2 (two) times daily for 14 days.  Dispense: 28 tablet; Refill: 0  2. Post-COVID chronic cough Symptomatic treatment of chronic cough and cough related to sinusitis prescribed with 1 refill. - benzonatate (TESSALON) 100 MG capsule; Take 1 capsule (100 mg total) by mouth 2 (two) times daily as needed for cough.  Dispense: 30 capsule; Refill: 1  3. Wheezing Wheezing associated with upper respiratory infection reported by patient, no history of asthma or COPD, prednisone taper prescribed to decrease inflammation.  - predniSONE (DELTASONE) 10 MG tablet; Take one tab 3 x day for 3 days, then take one tab 2 x a day for 3 days and then take one tab a day for 3 days for URI  Dispense: 18 tablet; Refill: 0   General Counseling: Rhonda [name] verbalizes understanding of the findings of today's phone visit and agrees with plan of treatment. I have discussed any further diagnostic evaluation that may be needed or ordered today. We also reviewed her medications today. she has been encouraged to call the office with any questions or concerns that should arise related to todays visit.  Return if symptoms worsen or fail to improve.   No  orders of the defined types were placed in this encounter.   Meds ordered this encounter  Medications   amoxicillin-clavulanate (AUGMENTIN) 875-125 MG tablet    Sig: Take 1 tablet by mouth 2 (two) times daily for 14 days.    Dispense:  28 tablet    Refill:  0   benzonatate (TESSALON) 100 MG capsule    Sig: Take 1 capsule (100 mg total) by mouth 2 (two) times daily as needed for cough.    Dispense:  30 capsule    Refill:  1   predniSONE (DELTASONE) 10 MG tablet    Sig: Take one tab 3 x day for 3 days, then take one tab 2 x a day for 3 days and then take one tab a day for 3 days for URI    Dispense:  18 tablet    Refill:  0     Time spent:10 Minutes Time spent with patient included reviewing progress notes, labs, imaging studies, and discussing plan for follow up.  [place] Controlled Substance Database was reviewed by me for overdose risk score (ORS) if appropriate.  This patient was seen by Jonetta Osgood, FNP-C in collaboration with Dr. Clayborn Bigness as a part of collaborative care agreement.  Larence Thone R. Valetta Fuller, MSN, FNP-C Internal medicine

## 2021-05-03 ENCOUNTER — Other Ambulatory Visit: Payer: Self-pay | Admitting: Emergency Medicine

## 2021-05-03 ENCOUNTER — Telehealth: Payer: Self-pay | Admitting: *Deleted

## 2021-05-03 DIAGNOSIS — C9000 Multiple myeloma not having achieved remission: Secondary | ICD-10-CM

## 2021-05-03 DIAGNOSIS — J301 Allergic rhinitis due to pollen: Secondary | ICD-10-CM | POA: Diagnosis not present

## 2021-05-03 MED ORDER — POMALIDOMIDE 2 MG PO CAPS: 2.0000 mg | ORAL_CAPSULE | Freq: Every day | ORAL | 0 refills | Status: DC

## 2021-05-03 NOTE — Telephone Encounter (Signed)
Dr Grayland Ormond signed prescription without the [employer] auth # on it and pharmacy calling asking for prescription to be resubmitted with auth # on it.

## 2021-05-08 MED ORDER — POMALIDOMIDE 2 MG PO CAPS: 2.0000 mg | ORAL_CAPSULE | Freq: Every day | ORAL | 0 refills | Status: DC

## 2021-05-08 NOTE — Addendum Note (Signed)
Addended by: Betti Cruz on: 05/08/2021 09:33 AM   Modules accepted: Orders

## 2021-05-16 ENCOUNTER — Inpatient Hospital Stay: Payer: Medicare PPO | Attending: Oncology

## 2021-05-16 ENCOUNTER — Other Ambulatory Visit: Payer: Self-pay

## 2021-05-16 DIAGNOSIS — I129 Hypertensive chronic kidney disease with stage 1 through stage 4 chronic kidney disease, or unspecified chronic kidney disease: Secondary | ICD-10-CM | POA: Insufficient documentation

## 2021-05-16 DIAGNOSIS — G629 Polyneuropathy, unspecified: Secondary | ICD-10-CM | POA: Diagnosis not present

## 2021-05-16 DIAGNOSIS — Z85820 Personal history of malignant melanoma of skin: Secondary | ICD-10-CM | POA: Insufficient documentation

## 2021-05-16 DIAGNOSIS — D72819 Decreased white blood cell count, unspecified: Secondary | ICD-10-CM | POA: Diagnosis not present

## 2021-05-16 DIAGNOSIS — N189 Chronic kidney disease, unspecified: Secondary | ICD-10-CM | POA: Diagnosis not present

## 2021-05-16 DIAGNOSIS — C9000 Multiple myeloma not having achieved remission: Secondary | ICD-10-CM

## 2021-05-16 DIAGNOSIS — C9002 Multiple myeloma in relapse: Secondary | ICD-10-CM | POA: Diagnosis not present

## 2021-05-16 DIAGNOSIS — Z862 Personal history of diseases of the blood and blood-forming organs and certain disorders involving the immune mechanism: Secondary | ICD-10-CM | POA: Insufficient documentation

## 2021-05-16 LAB — CBC WITH DIFFERENTIAL/PLATELET
Abs Immature Granulocytes: 0.02 10*3/uL (ref 0.00–0.07)
Basophils Absolute: 0.1 10*3/uL (ref 0.0–0.1)
Basophils Relative: 3 %
Eosinophils Absolute: 0.1 10*3/uL (ref 0.0–0.5)
Eosinophils Relative: 2 %
HCT: 39.6 % (ref 36.0–46.0)
Hemoglobin: 13.1 g/dL (ref 12.0–15.0)
Immature Granulocytes: 1 %
Lymphocytes Relative: 26 %
Lymphs Abs: 0.8 10*3/uL (ref 0.7–4.0)
MCH: 32.5 pg (ref 26.0–34.0)
MCHC: 33.1 g/dL (ref 30.0–36.0)
MCV: 98.3 fL (ref 80.0–100.0)
Monocytes Absolute: 0.6 10*3/uL (ref 0.1–1.0)
Monocytes Relative: 18 %
Neutro Abs: 1.5 10*3/uL — ABNORMAL LOW (ref 1.7–7.7)
Neutrophils Relative %: 50 %
Platelets: 156 10*3/uL (ref 150–400)
RBC: 4.03 MIL/uL (ref 3.87–5.11)
RDW: 13.8 % (ref 11.5–15.5)
WBC: 3.1 10*3/uL — ABNORMAL LOW (ref 4.0–10.5)
nRBC: 0 % (ref 0.0–0.2)

## 2021-05-16 LAB — BASIC METABOLIC PANEL
Anion gap: 7 (ref 5–15)
BUN: 19 mg/dL (ref 8–23)
CO2: 23 mmol/L (ref 22–32)
Calcium: 8.7 mg/dL — ABNORMAL LOW (ref 8.9–10.3)
Chloride: 105 mmol/L (ref 98–111)
Creatinine, Ser: 0.78 mg/dL (ref 0.44–1.00)
GFR, Estimated: >60 mL/min (ref >60)
Glucose, Bld: 111 mg/dL — ABNORMAL HIGH (ref 70–99)
Potassium: 4.1 mmol/L (ref 3.5–5.1)
Sodium: 135 mmol/L (ref 135–145)

## 2021-05-17 LAB — KAPPA/LAMBDA LIGHT CHAINS
Kappa free light chain: 19.8 mg/L — ABNORMAL HIGH (ref 3.3–19.4)
Kappa, lambda light chain ratio: 0.03 — ABNORMAL LOW (ref 0.26–1.65)
Lambda free light chains: 616.8 mg/L — ABNORMAL HIGH (ref 5.7–26.3)

## 2021-05-17 LAB — IGG, IGA, IGM
IgA: 48 mg/dL — ABNORMAL LOW (ref 64–422)
IgG (Immunoglobin G), Serum: 529 mg/dL — ABNORMAL LOW (ref 586–1602)
IgM (Immunoglobulin M), Srm: 17 mg/dL — ABNORMAL LOW (ref 26–217)

## 2021-05-18 LAB — PROTEIN ELECTROPHORESIS, SERUM
A/G Ratio: 1.6 (ref 0.7–1.7)
Albumin ELP: 3.5 g/dL (ref 2.9–4.4)
Alpha-1-Globulin: 0.2 g/dL (ref 0.0–0.4)
Alpha-2-Globulin: 0.6 g/dL (ref 0.4–1.0)
Beta Globulin: 0.8 g/dL (ref 0.7–1.3)
Gamma Globulin: 0.5 g/dL (ref 0.4–1.8)
Globulin, Total: 2.2 g/dL (ref 2.2–3.9)
Total Protein ELP: 5.7 g/dL — ABNORMAL LOW (ref 6.0–8.5)

## 2021-05-19 DIAGNOSIS — J301 Allergic rhinitis due to pollen: Secondary | ICD-10-CM | POA: Diagnosis not present

## 2021-05-22 NOTE — Progress Notes (Signed)
[place]  [phone] [phone]  ID: Rhonda [name] OB: [birth year]  MR#: [id number]  HUT#:[id number]  Patient Care Team: Lavera Guise, MD as PCP - General (Internal Medicine) Lloyd Huger, MD as Consulting Physician (Oncology)   CHIEF COMPLAINT:  Multiple myeloma in relapse.  INTERVAL HISTORY: Patient returns to clinic today for further evaluation and continuation of Zometa and pomalidomide.  She has chronic weakness and fatigue, but this does not affect her day-to-day activity and she remains active. She also has a chronic peripheral neuropathy that is unchanged.  She otherwise feels well and is asymptomatic.  She has no other neurologic complaints. She denies any recent fevers or illnesses. She has a good appetite and denies weight loss.  She denies any chest [name], shortness of breath, cough, or hemoptysis.  She denies any nausea, vomiting, constipation, or diarrhea.  She has no melena or hematochezia.  She has no urinary complaints.  Patient offers no further specific complaints today.  REVIEW OF SYSTEMS:   Review of Systems  Constitutional:  Positive for malaise/fatigue. Negative for fever and weight loss.  HENT:  Negative for congestion and sore throat.   Respiratory: Negative.  Negative for cough and shortness of breath.   Cardiovascular: Negative.  Negative for chest [name], palpitations and leg swelling.  Gastrointestinal: Negative.  Negative for abdominal [name], blood in stool, constipation, diarrhea, melena, nausea and vomiting.  Genitourinary: Negative.  Negative for dysuria.  Musculoskeletal: Negative.  Negative for back [name].  Skin: Negative.  Negative for rash.  Neurological:  Positive for tingling, sensory change and weakness. Negative for focal weakness.  Psychiatric/Behavioral: Negative.  The patient is not nervous/anxious and does not have insomnia.    As per HPI. Otherwise, a complete review of systems is  negative.  PAST MEDICAL HISTORY: Past Medical History:  Diagnosis Date   Actinic keratosis    Arthritis    hands   Benign neoplasm of ascending colon    Cataract    GERD (gastroesophageal reflux disease)    Hyperlipemia    Hypertension 01/31/2015   Hypothyroidism    Melanoma ([place]) 04/28/2018   R mid to distal ant lat thigh. MM, SS, tumor thickness 0.41m, antatomic level II. Excised: 05/13/18, margins free   [place]   [place]   Osteoporosis    Shingles    Skin cancer    basal cell   Squamous cell carcinoma of skin 02/04/2014   Left pretibial. KA-like pattern   Squamous cell carcinoma of skin 09/15/2015   Right lat. superior ankle are. Superficial infiltration. Tx: EDC   Squamous cell carcinoma of skin 10/15/2016   Left medial mid lower leg. Tx: EDC   Squamous cell carcinoma of skin 11/27/2018   Right dorsum proximal forearm.   Squamous cell carcinoma of skin 09/09/2019   Right cheek lat. to mid nasolabial area. Tx: EDC   Squamous cell carcinoma of skin 12/09/2019   right pretibial below knee    PAST SURGICAL HISTORY: Past Surgical History:  Procedure Laterality Date   BREAST EXCISIONAL BIOPSY Right 15+ yrs ago   EXCISIONAL - NEG   CATARACT EXTRACTION W/ INTRAOCULAR LENS IMPLANT     CHOLECYSTECTOMY N/A 03/14/2017   Procedure: LAPAROSCOPIC CHOLECYSTECTOMY;  Surgeon: CFlorene Glen MD;  Location: [facility];  Service: General;  Laterality: N/A;   COLONOSCOPY WITH PROPOFOL N/A 02/21/2015   Procedure: COLONOSCOPY WITH PROPOFOL;  Surgeon: DLucilla Lame MD;  [facility];  Service: Endoscopy;  Laterality: N/A;   LAPAROSCOPIC HYSTERECTOMY  1980   POLYPECTOMY  02/21/2015   Procedure: POLYPECTOMY;  Surgeon: Lucilla Lame, MD;  Location: [facility];  Service: Endoscopy;;    FAMILY HISTORY: Family History  Problem Relation Age of Onset   Heart disease Mother    Stroke Father    Heart disease  Brother    Kidney cancer Neg Hx    Prostate cancer Neg Hx    Bladder Cancer Neg Hx    Breast cancer Neg Hx     ADVANCED DIRECTIVES (Y/N):  N  HEALTH MAINTENANCE: Social History   Tobacco Use   Smoking status: Never   Smokeless tobacco: Never  Vaping Use   Vaping Use: Never used  Substance Use Topics   Alcohol use: No    Alcohol/week: 0.0 standard drinks   Drug use: No     Colonoscopy:  PAP:  Bone density:  Lipid panel:  Allergies  Allergen Reactions   Revlimid [Lenalidomide]     Current Outpatient Medications  Medication Sig Dispense Refill   aspirin EC 81 MG tablet Take 81 mg by mouth daily.     Cyanocobalamin (B-12 PO) Take 500 mcg by mouth daily.      EPINEPHrine 0.3 mg/0.3 mL IJ SOAJ injection      fexofenadine (ALLEGRA) 30 MG tablet Take 30 mg by mouth daily.      levothyroxine (SYNTHROID) 50 MCG tablet TAKE 1 TABLET BY MOUTH DAILY BEFORE BREAKFAST 90 tablet 3   Multiple Vitamins-Minerals (CENTRUM SILVER PO) Take 1 tablet by mouth daily.     pomalidomide (POMALYST) 2 MG capsule Take 1 capsule (2 mg total) by mouth daily. [employer] Auth # [id number]     Date Obtained 11/182022 21 capsule 0   triamcinolone (NASACORT) 55 MCG/ACT AERO nasal inhaler Place 2 sprays into the nose 2 (two) times daily as needed.     UNABLE TO FIND Inject 1 Dose as directed once a week. Allergy injections once a week     No current facility-administered medications for this visit.   Facility-Administered Medications Ordered in Other Visits  Medication Dose Route Frequency Provider Last Rate Last Admin   zoledronic acid (ZOMETA) 3 mg in sodium chloride 0.9 % 100 mL IVPB  3 mg Intravenous Once Lloyd Huger, MD 415 mL/hr at 05/23/21 1412 3 mg at 05/23/21 1412    OBJECTIVE: Vitals:   05/23/21 1256  BP: (!) 117/57  Pulse: 66  Resp: 16  Temp: (!) 97.1 F (36.2 C)  SpO2: 98%     Body mass index is 22.92 kg/m.    ECOG FS:0 - Asymptomatic  General: Well-developed, well-nourished,  no acute distress. Eyes: Pink conjunctiva, anicteric sclera. HEENT: Normocephalic, moist mucous membranes. Lungs: No audible wheezing or coughing. Heart: Regular rate and rhythm. Abdomen: Soft, nontender, no obvious distention. Musculoskeletal: No edema, cyanosis, or clubbing. Neuro: Alert, answering all questions appropriately. Cranial nerves grossly intact. Skin: No rashes or petechiae noted. Psych: Normal affect.  LAB RESULTS:  Lab Results  Component Value Date   NA 135 05/16/2021   K 4.1 05/16/2021   CL 105 05/16/2021   CO2 23 05/16/2021   GLUCOSE 111 (H) 05/16/2021   BUN 19 05/16/2021   CREATININE 0.78 05/16/2021   CALCIUM 8.7 (L) 05/16/2021   PROT 6.3 (L) 10/11/2020   ALBUMIN 3.9 10/11/2020   AST 22 10/11/2020   ALT 15 10/11/2020   ALKPHOS 75 10/11/2020   BILITOT 0.5 10/11/2020  GFRNONAA >60 05/16/2021   GFRAA 40 (L) 03/01/2020    Lab Results  Component Value Date   WBC 3.1 (L) 05/16/2021   NEUTROABS 1.5 (L) 05/16/2021   HGB 13.1 05/16/2021   HCT 39.6 05/16/2021   MCV 98.3 05/16/2021   PLT 156 05/16/2021   Lab Results  Component Value Date   TOTALPROTELP 5.7 (L) 05/16/2021   ALBUMINELP 3.5 05/16/2021   A1GS 0.2 05/16/2021   A2GS 0.6 05/16/2021   BETS 0.8 05/16/2021   GAMS 0.5 05/16/2021   MSPIKE Not Observed 05/16/2021   SPEI Comment 05/16/2021     STUDIES: No results found.  ASSESSMENT:  Multiple myeloma in relapse.  PLAN:    1.  Multiple myeloma in relapse: Bone marrow biopsy on April 09, 2016 revealed 44% plasma cells and normal cytogenetics. Given the results of her metastatic bone survey on March 12, 2016, her elevated lambda free chains, and hypercalcemia, patient fit the criteria for multiple myeloma and was initially treated with single agent Revlimid. Metastatic bone survey on February 12, 2019 revealed new small lesions in her calvarium.  Patient's most recent M spike is now 0.0 and her immunoglobulins remain decreased.  Lambda light  chains continue to trend down and are now 616.8.  Patient could not tolerate Revlimid and was switched to pomalidomide 2 mg daily for 21 days with a 7-day break.  Patient completed 1 year of monthly treatment and now only received Zometa every 3 months.  Continue Pomalyst as prescribed.  Proceed with dose reduced Zometa today.  Return to clinic in 3 months with repeat laboratory work, further evaluation, and continuation of treatment.   2.  Chronic renal insufficiency: Resolved. 3.  Anemia: Resolved. 4.  Leukopenia: Chronic and unchanged.  Monitor. 5.  Peripheral neuropathy: Chronic and unchanged.  Patient was previously given a referral to acupuncture which she said did not help much.  6.  Hypocalcemia: Mild.  Patient previously had hypercalcemia.  Proceed with Zometa as above.   Patient expressed understanding and was in agreement with this plan. She also understands that She can call clinic at any time with any questions, concerns, or complaints.    Lloyd Huger, MD 05/23/21 2:24 PM

## 2021-05-23 ENCOUNTER — Inpatient Hospital Stay: Payer: Medicare PPO

## 2021-05-23 ENCOUNTER — Other Ambulatory Visit: Payer: Self-pay

## 2021-05-23 ENCOUNTER — Inpatient Hospital Stay (HOSPITAL_BASED_OUTPATIENT_CLINIC_OR_DEPARTMENT_OTHER): Payer: Medicare PPO | Admitting: Oncology

## 2021-05-23 VITALS — BP 117/57 | HR 66 | Temp 97.1°F | Resp 16 | Wt 125.3 lb

## 2021-05-23 DIAGNOSIS — D72819 Decreased white blood cell count, unspecified: Secondary | ICD-10-CM | POA: Diagnosis not present

## 2021-05-23 DIAGNOSIS — C9000 Multiple myeloma not having achieved remission: Secondary | ICD-10-CM

## 2021-05-23 DIAGNOSIS — G629 Polyneuropathy, unspecified: Secondary | ICD-10-CM | POA: Diagnosis not present

## 2021-05-23 DIAGNOSIS — Z862 Personal history of diseases of the blood and blood-forming organs and certain disorders involving the immune mechanism: Secondary | ICD-10-CM | POA: Diagnosis not present

## 2021-05-23 DIAGNOSIS — I129 Hypertensive chronic kidney disease with stage 1 through stage 4 chronic kidney disease, or unspecified chronic kidney disease: Secondary | ICD-10-CM | POA: Diagnosis not present

## 2021-05-23 DIAGNOSIS — Z85820 Personal history of malignant melanoma of skin: Secondary | ICD-10-CM | POA: Diagnosis not present

## 2021-05-23 DIAGNOSIS — N189 Chronic kidney disease, unspecified: Secondary | ICD-10-CM | POA: Diagnosis not present

## 2021-05-23 DIAGNOSIS — C9002 Multiple myeloma in relapse: Secondary | ICD-10-CM | POA: Diagnosis not present

## 2021-05-23 MED ORDER — ZOLEDRONIC ACID 4 MG/5ML IV CONC
3.0000 mg | Freq: Once | INTRAVENOUS | Status: AC
  Administered 2021-05-23: 3 mg via INTRAVENOUS
  Filled 2021-05-23: qty 3.75

## 2021-05-23 MED ORDER — SODIUM CHLORIDE 0.9 % IV SOLN
Freq: Once | INTRAVENOUS | Status: AC
  Filled 2021-05-23: qty 250

## 2021-05-23 NOTE — Progress Notes (Signed)
Pt has no concerns at this time. 

## 2021-05-23 NOTE — Patient Instructions (Signed)

## 2021-05-26 ENCOUNTER — Other Ambulatory Visit: Payer: Self-pay | Admitting: Oncology

## 2021-05-26 DIAGNOSIS — C9000 Multiple myeloma not having achieved remission: Secondary | ICD-10-CM

## 2021-05-26 DIAGNOSIS — J301 Allergic rhinitis due to pollen: Secondary | ICD-10-CM | POA: Diagnosis not present

## 2021-05-26 NOTE — Telephone Encounter (Signed)
Unable to complete celgene auth until 12/18. Will complete as soon as possible

## 2021-05-30 ENCOUNTER — Encounter: Payer: Self-pay | Admitting: Oncology

## 2021-06-22 ENCOUNTER — Ambulatory Visit: Payer: Medicare PPO | Admitting: Dermatology

## 2021-06-22 ENCOUNTER — Other Ambulatory Visit: Payer: Self-pay

## 2021-06-22 DIAGNOSIS — L570 Actinic keratosis: Secondary | ICD-10-CM | POA: Diagnosis not present

## 2021-06-22 DIAGNOSIS — L821 Other seborrheic keratosis: Secondary | ICD-10-CM | POA: Diagnosis not present

## 2021-06-22 DIAGNOSIS — L820 Inflamed seborrheic keratosis: Secondary | ICD-10-CM

## 2021-06-22 DIAGNOSIS — L814 Other melanin hyperpigmentation: Secondary | ICD-10-CM

## 2021-06-22 DIAGNOSIS — Z85820 Personal history of malignant melanoma of skin: Secondary | ICD-10-CM | POA: Diagnosis not present

## 2021-06-22 DIAGNOSIS — D229 Melanocytic nevi, unspecified: Secondary | ICD-10-CM

## 2021-06-22 DIAGNOSIS — L578 Other skin changes due to chronic exposure to nonionizing radiation: Secondary | ICD-10-CM

## 2021-06-22 DIAGNOSIS — D1800 Hemangioma unspecified site: Secondary | ICD-10-CM

## 2021-06-22 DIAGNOSIS — Z1283 Encounter for screening for malignant neoplasm of skin: Secondary | ICD-10-CM | POA: Diagnosis not present

## 2021-06-22 DIAGNOSIS — Z85828 Personal history of other malignant neoplasm of skin: Secondary | ICD-10-CM

## 2021-06-22 NOTE — Patient Instructions (Addendum)

## 2021-06-22 NOTE — Progress Notes (Signed)
Follow-Up Visit   Subjective  Rhonda [name] is a 88 y.o. female who presents for the following: Annual Exam (Mole check ). 6 months f/u hx of Melanoma on the right mid to distal ant lat thigh removed 04/28/2018. Hx of SCC.  The patient presents for Total-Body Skin Exam (TBSE) for skin cancer screening and mole check.  The patient has spots, moles and lesions to be evaluated, some may be new or changing and the patient has concerns that these could be cancer.   The following portions of the chart were reviewed this encounter and updated as appropriate:   Tobacco   Allergies   Meds   Problems   Med Hx   Surg Hx   Fam Hx      Review of Systems:  No other skin or systemic complaints except as noted in HPI or Assessment and Plan.  Objective  Well appearing patient in no apparent distress; mood and affect are within normal limits.  A full examination was performed including scalp, head, eyes, ears, nose, lips, neck, chest, axillae, abdomen, back, buttocks, bilateral upper extremities, bilateral lower extremities, hands, feet, fingers, toes, fingernails, and toenails. All findings within normal limits unless otherwise noted below.  right upper lip x1, right nose x 1, (2) Erythematous thin papules/macules with gritty scale.   arms x 11 (11) Stuck-on, waxy, tan-brown papules   Assessment & Plan  AK (actinic keratosis) (2) right upper lip x1, right nose x 1,  Actinic keratoses are precancerous spots that appear secondary to cumulative UV radiation exposure/sun exposure over time. They are chronic with expected duration over 1 year. A portion of actinic keratoses will progress to squamous cell carcinoma of the skin. It is not possible to reliably predict which spots will progress to skin cancer and so treatment is recommended to prevent development of skin cancer.  Recommend daily broad spectrum sunscreen SPF 30+ to sun-exposed areas, reapply every 2 hours as needed.  Recommend staying in  the shade or wearing long sleeves, sun glasses (UVA+UVB protection) and wide brim hats (4-inch brim around the entire circumference of the hat). Call for new or changing lesions.   Destruction of lesion - right upper lip x1, right nose x 1, Complexity: simple   Destruction method: cryotherapy   Informed consent: discussed and consent obtained   Timeout:  patient name, date of birth, surgical site, and procedure verified Lesion destroyed using liquid nitrogen: Yes   Region frozen until ice ball extended beyond lesion: Yes   Outcome: patient tolerated procedure well with no complications   Post-procedure details: wound care instructions given    Inflamed seborrheic keratosis (11) arms x 11  Reassured benign age-related growth.  Recommend observation.  Discussed cryotherapy if spot(s) become irritated or inflamed.   Destruction of lesion - arms x 11 Complexity: simple   Destruction method: cryotherapy   Informed consent: discussed and consent obtained   Timeout:  patient name, date of birth, surgical site, and procedure verified Lesion destroyed using liquid nitrogen: Yes   Region frozen until ice ball extended beyond lesion: Yes   Outcome: patient tolerated procedure well with no complications   Post-procedure details: wound care instructions given    Skin cancer screening   Lentigines - Scattered tan macules - Due to sun exposure - Benign-appearing, observe - Recommend daily broad spectrum sunscreen SPF 30+ to sun-exposed areas, reapply every 2 hours as needed. - Call for any changes  Seborrheic Keratoses - Stuck-on, waxy, tan-brown papules and/or  plaques  - Benign-appearing - Discussed benign etiology and prognosis. - Observe - Call for any changes  Melanocytic Nevi - Tan-brown and/or pink-flesh-colored symmetric macules and papules - Benign appearing on exam today - Observation - Call clinic for new or changing moles - Recommend daily use of broad spectrum spf 30+  sunscreen to sun-exposed areas.   Hemangiomas - Red papules - Discussed benign nature - Observe - Call for any changes  Actinic Damage - Chronic condition, secondary to cumulative UV/sun exposure - diffuse scaly erythematous macules with underlying dyspigmentation - Recommend daily broad spectrum sunscreen SPF 30+ to sun-exposed areas, reapply every 2 hours as needed.  - Staying in the shade or wearing long sleeves, sun glasses (UVA+UVB protection) and wide brim hats (4-inch brim around the entire circumference of the hat) are also recommended for sun protection.  - Call for new or changing lesions.  History of Melanoma right mid to distal ant lat thigh  - No evidence of recurrence today - No lymphadenopathy - Recommend regular full body skin exams - Recommend daily broad spectrum sunscreen SPF 30+ to sun-exposed areas, reapply every 2 hours as needed.  - Call if any new or changing lesions are noted between office visits   History of Squamous Cell Carcinoma of the Skin Multiple see history  - No evidence of recurrence today - No lymphadenopathy - Recommend regular full body skin exams - Recommend daily broad spectrum sunscreen SPF 30+ to sun-exposed areas, reapply every 2 hours as needed.  - Call if any new or changing lesions are noted between office visits  Skin cancer screening performed today.   Return in about 6 months (around 12/20/2021) for TBSE, Hx of Melanoma.  IMarye Round, CMA, am acting as scribe for Sarina Ser, MD .  Documentation: I have reviewed the above documentation for accuracy and completeness, and I agree with the above.  Sarina Ser, MD

## 2021-06-23 ENCOUNTER — Other Ambulatory Visit: Payer: Self-pay | Admitting: *Deleted

## 2021-06-23 DIAGNOSIS — J301 Allergic rhinitis due to pollen: Secondary | ICD-10-CM | POA: Diagnosis not present

## 2021-06-23 DIAGNOSIS — C9000 Multiple myeloma not having achieved remission: Secondary | ICD-10-CM

## 2021-06-23 MED ORDER — POMALIDOMIDE 2 MG PO CAPS: 2.0000 mg | ORAL_CAPSULE | Freq: Every day | ORAL | 0 refills | Status: DC

## 2021-06-26 ENCOUNTER — Encounter: Payer: Self-pay | Admitting: Dermatology

## 2021-06-30 DIAGNOSIS — J301 Allergic rhinitis due to pollen: Secondary | ICD-10-CM | POA: Diagnosis not present

## 2021-06-30 NOTE — Progress Notes (Signed)
Error

## 2021-07-07 DIAGNOSIS — J301 Allergic rhinitis due to pollen: Secondary | ICD-10-CM | POA: Diagnosis not present

## 2021-07-11 DIAGNOSIS — I700 Atherosclerosis of aorta: Secondary | ICD-10-CM | POA: Diagnosis not present

## 2021-07-11 DIAGNOSIS — C9000 Multiple myeloma not having achieved remission: Secondary | ICD-10-CM | POA: Diagnosis not present

## 2021-07-11 DIAGNOSIS — D6949 Other primary thrombocytopenia: Secondary | ICD-10-CM | POA: Diagnosis not present

## 2021-07-11 DIAGNOSIS — D8481 Immunodeficiency due to conditions classified elsewhere: Secondary | ICD-10-CM | POA: Diagnosis not present

## 2021-07-11 DIAGNOSIS — G620 Drug-induced polyneuropathy: Secondary | ICD-10-CM | POA: Diagnosis not present

## 2021-07-11 DIAGNOSIS — D84821 Immunodeficiency due to drugs: Secondary | ICD-10-CM | POA: Diagnosis not present

## 2021-07-11 DIAGNOSIS — E785 Hyperlipidemia, unspecified: Secondary | ICD-10-CM | POA: Diagnosis not present

## 2021-07-11 DIAGNOSIS — J301 Allergic rhinitis due to pollen: Secondary | ICD-10-CM | POA: Diagnosis not present

## 2021-07-11 DIAGNOSIS — E039 Hypothyroidism, unspecified: Secondary | ICD-10-CM | POA: Diagnosis not present

## 2021-07-14 ENCOUNTER — Other Ambulatory Visit: Payer: Self-pay | Admitting: Oncology

## 2021-07-14 DIAGNOSIS — J301 Allergic rhinitis due to pollen: Secondary | ICD-10-CM | POA: Diagnosis not present

## 2021-07-14 DIAGNOSIS — C9000 Multiple myeloma not having achieved remission: Secondary | ICD-10-CM

## 2021-07-21 DIAGNOSIS — J301 Allergic rhinitis due to pollen: Secondary | ICD-10-CM | POA: Diagnosis not present

## 2021-07-25 ENCOUNTER — Other Ambulatory Visit: Payer: Self-pay

## 2021-07-25 ENCOUNTER — Encounter: Payer: Self-pay | Admitting: Nurse Practitioner

## 2021-07-25 ENCOUNTER — Ambulatory Visit: Payer: Medicare PPO | Admitting: Nurse Practitioner

## 2021-07-25 VITALS — BP 140/80 | HR 61 | Temp 98.6°F | Resp 16 | Ht 62.0 in | Wt 126.8 lb

## 2021-07-25 DIAGNOSIS — J0141 Acute recurrent pansinusitis: Secondary | ICD-10-CM

## 2021-07-25 DIAGNOSIS — R0602 Shortness of breath: Secondary | ICD-10-CM

## 2021-07-25 DIAGNOSIS — I10 Essential (primary) hypertension: Secondary | ICD-10-CM | POA: Diagnosis not present

## 2021-07-25 DIAGNOSIS — Z0001 Encounter for general adult medical examination with abnormal findings: Secondary | ICD-10-CM

## 2021-07-25 DIAGNOSIS — I499 Cardiac arrhythmia, unspecified: Secondary | ICD-10-CM | POA: Diagnosis not present

## 2021-07-25 DIAGNOSIS — C9001 Multiple myeloma in remission: Secondary | ICD-10-CM | POA: Diagnosis not present

## 2021-07-25 DIAGNOSIS — I491 Atrial premature depolarization: Secondary | ICD-10-CM

## 2021-07-25 MED ORDER — AZITHROMYCIN 250 MG PO TABS: ORAL_TABLET | ORAL | 0 refills | Status: DC

## 2021-07-25 NOTE — Progress Notes (Unsigned)
[facility] [place], [place] [address]  Internal MEDICINE  Office Visit Note  Patient Name: Rhonda [name]  [id number]  [id number]  Date of Service: 07/25/2021  Chief Complaint  Patient presents with   Sinusitis   Annual Exam     Sinusitis Associated symptoms include congestion, coughing, ear pain, headaches, sinus pressure and a sore throat. Pertinent negatives include no chills or shortness of breath.  Rhonda [name] presents for an acute sick visit for  Taking mucinex   Current Medication:  Outpatient Encounter Medications as of 07/25/2021  Medication Sig   aspirin EC 81 MG tablet Take 81 mg by mouth daily.   Cyanocobalamin (B-12 PO) Take 500 mcg by mouth daily.    EPINEPHrine 0.3 mg/0.3 mL IJ SOAJ injection    fexofenadine (ALLEGRA) 30 MG tablet Take 30 mg by mouth daily.    levothyroxine (SYNTHROID) 50 MCG tablet TAKE 1 TABLET BY MOUTH DAILY BEFORE BREAKFAST   Multiple Vitamins-Minerals (CENTRUM SILVER PO) Take 1 tablet by mouth daily.   POMALYST 2 MG capsule TAKE 1 CAPSULE BY MOUTH EVERY DAY   triamcinolone (NASACORT) 55 MCG/ACT AERO nasal inhaler Place 2 sprays into the nose 2 (two) times daily as needed.   UNABLE TO FIND Inject 1 Dose as directed once a week. Allergy injections once a week   No facility-administered encounter medications on file as of 07/25/2021.      Medical History: Past Medical History:  Diagnosis Date   Actinic keratosis    Arthritis    hands   Benign neoplasm of ascending colon    Cataract    GERD (gastroesophageal reflux disease)    Hyperlipemia    Hypertension 01/31/2015   Hypothyroidism    Melanoma ([place]) 04/28/2018   R mid to distal ant lat thigh. MM, SS, tumor thickness 0.79mm, antatomic level II. Excised: 05/13/18, margins free   Multiple myeloma ([place]) 04/18/2016   Multiple myeloma in remission ([place]) 04/18/2016   Osteoporosis    Shingles    Skin cancer    basal cell   Squamous cell carcinoma of skin  02/04/2014   Left pretibial. KA-like pattern   Squamous cell carcinoma of skin 09/15/2015   Right lat. superior ankle are. Superficial infiltration. Tx: EDC   Squamous cell carcinoma of skin 10/15/2016   Left medial mid lower leg. Tx: EDC   Squamous cell carcinoma of skin 11/27/2018   Right dorsum proximal forearm.   Squamous cell carcinoma of skin 09/09/2019   Right cheek lat. to mid nasolabial area. Tx: EDC   Squamous cell carcinoma of skin 12/09/2019   right pretibial below knee     Vital Signs: BP (!) 156/84    Pulse 61    Temp 98.6 F (37 C)    Resp 16    Ht $R'5\' 2"'gF$  (1.575 m)    Wt 126 lb 12.8 oz (57.5 kg)    SpO2 97%    BMI 23.19 kg/m    Review of Systems  Constitutional:  Negative for chills, fatigue and fever.  HENT:  Positive for congestion, ear pain, postnasal drip, rhinorrhea, sinus pressure, sinus pain and sore throat.   Eyes:  Positive for pain and discharge.  Respiratory:  Positive for cough. Negative for chest tightness, shortness of breath and wheezing.   Cardiovascular:  Negative for chest pain and palpitations.  Gastrointestinal:  Negative for abdominal pain, constipation, diarrhea, nausea and vomiting.  Endocrine: Negative.   Genitourinary: Negative.   Musculoskeletal: Negative.  Negative for  myalgias.  Skin:  Negative for rash.  Allergic/Immunologic: Negative.   Neurological:  Positive for dizziness and headaches.   Physical Exam Constitutional:      General: She is not in acute distress.    Appearance: Normal appearance. She is not ill-appearing.  HENT:     Head: Normocephalic and atraumatic.  Cardiovascular:     Rate and Rhythm: Normal rate and regular rhythm.  Pulmonary:     Effort: Pulmonary effort is normal. No respiratory distress.  Neurological:     Mental Status: She is alert.      Assessment/Plan:   General Counseling: Jennilyn verbalizes understanding of the findings of todays visit and agrees with plan of treatment. I have discussed any  further diagnostic evaluation that may be needed or ordered today. We also reviewed her medications today. she has been encouraged to call the office with any questions or concerns that should arise related to todays visit.    Counseling:    No orders of the defined types were placed in this encounter.   No orders of the defined types were placed in this encounter.   No follow-ups on file.  [place] Controlled Substance Database was reviewed by me for overdose risk score (ORS)  Time spent:*** Minutes Time spent with patient included reviewing progress notes, labs, imaging studies, and discussing plan for follow up.   This patient was seen by Jonetta Osgood, FNP-C in collaboration with Dr. Clayborn Bigness as a part of collaborative care agreement.  Jeyli Zwicker R. Valetta Fuller, MSN, FNP-C Internal Medicine

## 2021-07-26 DIAGNOSIS — J301 Allergic rhinitis due to pollen: Secondary | ICD-10-CM | POA: Diagnosis not present

## 2021-07-28 DIAGNOSIS — J301 Allergic rhinitis due to pollen: Secondary | ICD-10-CM | POA: Diagnosis not present

## 2021-08-04 DIAGNOSIS — J301 Allergic rhinitis due to pollen: Secondary | ICD-10-CM | POA: Diagnosis not present

## 2021-08-07 DIAGNOSIS — H40153 Residual stage of open-angle glaucoma, bilateral: Secondary | ICD-10-CM | POA: Diagnosis not present

## 2021-08-10 ENCOUNTER — Other Ambulatory Visit: Payer: Self-pay | Admitting: Oncology

## 2021-08-10 DIAGNOSIS — C9000 Multiple myeloma not having achieved remission: Secondary | ICD-10-CM

## 2021-08-11 DIAGNOSIS — J301 Allergic rhinitis due to pollen: Secondary | ICD-10-CM | POA: Diagnosis not present

## 2021-08-15 ENCOUNTER — Other Ambulatory Visit: Payer: Self-pay | Admitting: *Deleted

## 2021-08-15 DIAGNOSIS — C9000 Multiple myeloma not having achieved remission: Secondary | ICD-10-CM

## 2021-08-18 DIAGNOSIS — J301 Allergic rhinitis due to pollen: Secondary | ICD-10-CM | POA: Diagnosis not present

## 2021-08-22 ENCOUNTER — Other Ambulatory Visit: Payer: Self-pay

## 2021-08-22 ENCOUNTER — Inpatient Hospital Stay: Payer: Medicare PPO | Attending: Oncology

## 2021-08-22 DIAGNOSIS — G629 Polyneuropathy, unspecified: Secondary | ICD-10-CM | POA: Insufficient documentation

## 2021-08-22 DIAGNOSIS — I129 Hypertensive chronic kidney disease with stage 1 through stage 4 chronic kidney disease, or unspecified chronic kidney disease: Secondary | ICD-10-CM | POA: Diagnosis not present

## 2021-08-22 DIAGNOSIS — N189 Chronic kidney disease, unspecified: Secondary | ICD-10-CM | POA: Insufficient documentation

## 2021-08-22 DIAGNOSIS — Z85820 Personal history of malignant melanoma of skin: Secondary | ICD-10-CM | POA: Insufficient documentation

## 2021-08-22 DIAGNOSIS — C9002 Multiple myeloma in relapse: Secondary | ICD-10-CM | POA: Insufficient documentation

## 2021-08-22 DIAGNOSIS — Z862 Personal history of diseases of the blood and blood-forming organs and certain disorders involving the immune mechanism: Secondary | ICD-10-CM | POA: Diagnosis not present

## 2021-08-22 DIAGNOSIS — C9000 Multiple myeloma not having achieved remission: Secondary | ICD-10-CM

## 2021-08-22 LAB — CBC WITH DIFFERENTIAL/PLATELET
Abs Immature Granulocytes: 0.02 10*3/uL (ref 0.00–0.07)
Basophils Absolute: 0.1 10*3/uL (ref 0.0–0.1)
Basophils Relative: 2 %
Eosinophils Absolute: 0.1 10*3/uL (ref 0.0–0.5)
Eosinophils Relative: 2 %
HCT: 38.7 % (ref 36.0–46.0)
Hemoglobin: 13.1 g/dL (ref 12.0–15.0)
Immature Granulocytes: 0 %
Lymphocytes Relative: 21 %
Lymphs Abs: 1 10*3/uL (ref 0.7–4.0)
MCH: 33 pg (ref 26.0–34.0)
MCHC: 33.9 g/dL (ref 30.0–36.0)
MCV: 97.5 fL (ref 80.0–100.0)
Monocytes Absolute: 0.8 10*3/uL (ref 0.1–1.0)
Monocytes Relative: 18 %
Neutro Abs: 2.6 10*3/uL (ref 1.7–7.7)
Neutrophils Relative %: 57 %
Platelets: 143 10*3/uL — ABNORMAL LOW (ref 150–400)
RBC: 3.97 MIL/uL (ref 3.87–5.11)
RDW: 13.3 % (ref 11.5–15.5)
WBC: 4.6 10*3/uL (ref 4.0–10.5)
nRBC: 0 % (ref 0.0–0.2)

## 2021-08-22 LAB — BASIC METABOLIC PANEL
Anion gap: 6 (ref 5–15)
BUN: 28 mg/dL — ABNORMAL HIGH (ref 8–23)
CO2: 25 mmol/L (ref 22–32)
Calcium: 9 mg/dL (ref 8.9–10.3)
Chloride: 106 mmol/L (ref 98–111)
Creatinine, Ser: 1.06 mg/dL — ABNORMAL HIGH (ref 0.44–1.00)
GFR, Estimated: 51 mL/min — ABNORMAL LOW (ref >60)
Glucose, Bld: 127 mg/dL — ABNORMAL HIGH (ref 70–99)
Potassium: 4.2 mmol/L (ref 3.5–5.1)
Sodium: 137 mmol/L (ref 135–145)

## 2021-08-23 LAB — IGG, IGA, IGM
IgA: 63 mg/dL — ABNORMAL LOW (ref 64–422)
IgG (Immunoglobin G), Serum: 549 mg/dL — ABNORMAL LOW (ref 586–1602)
IgM (Immunoglobulin M), Srm: 17 mg/dL — ABNORMAL LOW (ref 26–217)

## 2021-08-23 LAB — KAPPA/LAMBDA LIGHT CHAINS
Kappa free light chain: 30.1 mg/L — ABNORMAL HIGH (ref 3.3–19.4)
Kappa, lambda light chain ratio: 0.06 — ABNORMAL LOW (ref 0.26–1.65)
Lambda free light chains: 520.7 mg/L — ABNORMAL HIGH (ref 5.7–26.3)

## 2021-08-25 DIAGNOSIS — J301 Allergic rhinitis due to pollen: Secondary | ICD-10-CM | POA: Diagnosis not present

## 2021-08-25 LAB — IMMUNOFIXATION ELECTROPHORESIS
IgA: 59 mg/dL — ABNORMAL LOW (ref 64–422)
IgG (Immunoglobin G), Serum: 579 mg/dL — ABNORMAL LOW (ref 586–1602)
IgM (Immunoglobulin M), Srm: 18 mg/dL — ABNORMAL LOW (ref 26–217)
Total Protein ELP: 5.7 g/dL — ABNORMAL LOW (ref 6.0–8.5)

## 2021-08-27 ENCOUNTER — Encounter: Payer: Self-pay | Admitting: Nurse Practitioner

## 2021-08-27 NOTE — Progress Notes (Signed)
?[place]  ?[phone] [phone] ? ?ID: [name] OB: [birth year]  MR#: [id number]  XBD#:[id number] ? ?Patient Care Team: ?Lavera Guise, MD as PCP - General (Internal Medicine) ?Lloyd Huger, MD as Consulting Physician (Oncology) ? ? ?CHIEF COMPLAINT:  Multiple myeloma in relapse. ? ?INTERVAL HISTORY: Patient returns to clinic today for routine 37-monthevaluation and continuation of Zometa and pomalidomide.  She continues to have chronic weakness and fatigue, but remains active and this does not affect her day-to-day activity.  Her chronic peripheral neuropathy is also unchanged.  She has no other neurologic complaints. She denies any recent fevers or illnesses. She has a good appetite and denies weight loss.  She denies any chest pain, shortness of breath, cough, or hemoptysis.  She denies any nausea, vomiting, constipation, or diarrhea.  She has no melena or hematochezia.  She has no urinary complaints.  Patient offers no further specific complaints today. ? ?REVIEW OF SYSTEMS:   ?Review of Systems  ?Constitutional:  Positive for malaise/fatigue. Negative for fever and weight loss.  ?HENT:  Negative for congestion and sore throat.   ?Respiratory: Negative.  Negative for cough and shortness of breath.   ?Cardiovascular: Negative.  Negative for chest pain, palpitations and leg swelling.  ?Gastrointestinal: Negative.  Negative for abdominal pain, blood in stool, constipation, diarrhea, melena, nausea and vomiting.  ?Genitourinary: Negative.  Negative for dysuria.  ?Musculoskeletal: Negative.  Negative for back pain.  ?Skin: Negative.  Negative for rash.  ?Neurological:  Positive for tingling, sensory change and weakness. Negative for focal weakness.  ?Psychiatric/Behavioral: Negative.  The patient is not nervous/anxious and does not have insomnia.   ? ?As per HPI. Otherwise, a complete review of systems is negative. ? ?PAST MEDICAL HISTORY: ?Past Medical  History:  ?Diagnosis Date  ? Actinic keratosis   ? Arthritis   ? hands  ? Benign neoplasm of ascending colon   ? Cataract   ? GERD (gastroesophageal reflux disease)   ? Hyperlipemia   ? Hypertension 01/31/2015  ? Hypothyroidism   ? [place]anoma ([facility] 04/28/2018  ? R mid to distal ant lat thigh. MM, SS, tumor thickness 0.282m antatomic level II. Excised: 05/13/18, margins free  ? Multiple myeloma ([facility]11/01/2016  ? Multiple myeloma in remission ([facility]/01/2016  ? Osteoporosis   ? Shingles   ? Skin cancer   ? basal cell  ? Squamous cell carcinoma of skin 02/04/2014  ? Left pretibial. KA-like pattern  ? Squamous cell carcinoma of skin 09/15/2015  ? Right lat. superior ankle are. Superficial infiltration. Tx: EDC  ? Squamous cell carcinoma of skin 10/15/2016  ? Left medial mid lower leg. Tx: EDC  ? Squamous cell carcinoma of skin 11/27/2018  ? Right dorsum proximal forearm.  ? Squamous cell carcinoma of skin 09/09/2019  ? Right cheek lat. to mid nasolabial area. Tx: EDC  ? Squamous cell carcinoma of skin 12/09/2019  ? right pretibial below knee  ? ? ?PAST SURGICAL HISTORY: ?Past Surgical History:  ?Procedure Laterality Date  ? BREAST EXCISIONAL BIOPSY Right 15+ yrs ago  ? EXCISIONAL - NEG  ? CATARACT EXTRACTION W/ INTRAOCULAR LENS IMPLANT    ? CHOLECYSTECTOMY N/A 03/14/2017  ? Procedure: LAPAROSCOPIC CHOLECYSTECTOMY;  Surgeon: CoFlorene GlenMD;  Location: [facility];  Service: General;  Laterality: N/A;  ? COLONOSCOPY WITH PROPOFOL N/A 02/21/2015  ? Procedure: COLONOSCOPY WITH PROPOFOL;  Surgeon: DaLucilla LameMD;  [facility] Service: Endoscopy;  Laterality: N/A;  ?  LAPAROSCOPIC HYSTERECTOMY  1980  ? POLYPECTOMY  02/21/2015  ? Procedure: POLYPECTOMY;  Surgeon: Lucilla Lame, MD;  Location: [facility];  Service: Endoscopy;;  ? ? ?FAMILY HISTORY: ?Family History  ?Problem Relation Age of Onset  ? Heart disease Mother   ? Stroke Father   ? Heart disease Brother   ? Kidney cancer Neg Hx   ? Prostate  cancer Neg Hx   ? Bladder Cancer Neg Hx   ? Breast cancer Neg Hx   ? ? ?ADVANCED DIRECTIVES (Y/N):  N ? ?HEALTH MAINTENANCE: ?Social History  ? ?Tobacco Use  ? Smoking status: Never  ? Smokeless tobacco: Never  ?Vaping Use  ? Vaping Use: Never used  ?Substance Use Topics  ? Alcohol use: No  ?  Alcohol/week: 0.0 standard drinks  ? Drug use: No  ? ? ? Colonoscopy: ? PAP: ? Bone density: ? Lipid panel: ? ?Allergies  ?Allergen Reactions  ? Revlimid [Lenalidomide]   ? ? ?Current Outpatient Medications  ?Medication Sig Dispense Refill  ? aspirin EC 81 MG tablet Take 81 mg by mouth daily.    ? Cyanocobalamin (B-12 PO) Take 500 mcg by mouth daily.     ? fexofenadine (ALLEGRA) 30 MG tablet Take 30 mg by mouth daily.     ? levothyroxine (SYNTHROID) 50 MCG tablet TAKE 1 TABLET BY MOUTH DAILY BEFORE BREAKFAST 90 tablet 3  ? Multiple Vitamins-Minerals (CENTRUM SILVER PO) Take 1 tablet by mouth daily.    ? POMALYST 2 MG capsule TAKE 1 CAPSULE BY MOUTH EVERY DAY 21 capsule 0  ? UNABLE TO FIND Inject 1 Dose as directed once a week. Allergy injections once a week    ? EPINEPHrine 0.3 mg/0.3 mL IJ SOAJ injection  (Patient not taking: Reported on 08/28/2021)    ? triamcinolone (NASACORT) 55 MCG/ACT AERO nasal inhaler Place 2 sprays into the nose 2 (two) times daily as needed. (Patient not taking: Reported on 08/28/2021)    ? ?No current facility-administered medications for this visit.  ? ? ?OBJECTIVE: ?Vitals:  ? 08/29/21 1259  ?BP: 128/69  ?Pulse: 69  ?Resp: 16  ?Temp: (!) 96 ?F (35.6 ?C)  ?SpO2: 99%  ?   Body mass index is 23.28 kg/m?Marland Kitchen    ECOG FS:0 - Asymptomatic ? ?General: Well-developed, well-nourished, no acute distress. ?Eyes: Pink conjunctiva, anicteric sclera. ?HEENT: Normocephalic, moist mucous membranes. ?Lungs: No audible wheezing or coughing. ?Heart: Regular rate and rhythm. ?Abdomen: Soft, nontender, no obvious distention. ?Musculoskeletal: No edema, cyanosis, or clubbing. ?Neuro: Alert, answering all questions  appropriately. Cranial nerves grossly intact. ?Skin: No rashes or petechiae noted. ?Psych: Normal affect. ? ? ?LAB RESULTS: ? ?Lab Results  ?Component Value Date  ? NA 137 08/22/2021  ? K 4.2 08/22/2021  ? CL 106 08/22/2021  ? CO2 25 08/22/2021  ? GLUCOSE 127 (H) 08/22/2021  ? BUN 28 (H) 08/22/2021  ? CREATININE 1.06 (H) 08/22/2021  ? CALCIUM 9.0 08/22/2021  ? PROT 6.3 (L) 10/11/2020  ? ALBUMIN 3.9 10/11/2020  ? AST 22 10/11/2020  ? ALT 15 10/11/2020  ? ALKPHOS 75 10/11/2020  ? BILITOT 0.5 10/11/2020  ? GFRNONAA 51 (L) 08/22/2021  ? GFRAA 40 (L) 03/01/2020  ? ? ?Lab Results  ?Component Value Date  ? WBC 4.6 08/22/2021  ? NEUTROABS 2.6 08/22/2021  ? HGB 13.1 08/22/2021  ? HCT 38.7 08/22/2021  ? MCV 97.5 08/22/2021  ? PLT 143 (L) 08/22/2021  ? ?Lab Results  ?Component Value Date  ? TOTALPROTELP 5.7 (  L) 08/22/2021  ? ALBUMINELP 3.5 05/16/2021  ? A1GS 0.2 05/16/2021  ? A2GS 0.6 05/16/2021  ? BETS 0.8 05/16/2021  ? GAMS 0.5 05/16/2021  ? MSPIKE Not Observed 05/16/2021  ? SPEI Comment 05/16/2021  ? ? ? ?STUDIES: ?No results found. ? ?ASSESSMENT:  Multiple myeloma in relapse. ? ?PLAN:   ? ?1.  Multiple myeloma in relapse: Bone marrow biopsy on April 09, 2016 revealed 44% plasma cells and normal cytogenetics. Given the results of her metastatic bone survey on March 12, 2016, her elevated lambda free chains, and hypercalcemia, patient fit the criteria for multiple myeloma and was initially treated with single agent Revlimid. Metastatic bone survey on February 12, 2019 revealed new small lesions in her calvarium.  Patient's most recent M spike is now 0.0 and her immunoglobulins remain decreased.  Lambda light chains continue to trend down and are now 520.7.  Patient could not tolerate Revlimid and was switched to pomalidomide 2 mg daily for 21 days with a 7-day break.  Patient completed 1 year of monthly treatment of Zometa, now only receives treatment every 3 months.  Continue Pomalyst as prescribed.  Proceed with dose  reduced Zometa today.  Return to clinic in 3 months with repeat laboratory work, further evaluation, and continuation of treatment.    ?2.  Chronic renal insufficiency: Creatinine slightly elevated today at 1.06.  [place]

## 2021-08-28 NOTE — Progress Notes (Signed)
Pt goes Wednesday for Echo d/t irregular heart rate.  ?

## 2021-08-29 ENCOUNTER — Inpatient Hospital Stay: Payer: Medicare PPO

## 2021-08-29 ENCOUNTER — Other Ambulatory Visit: Payer: Self-pay

## 2021-08-29 ENCOUNTER — Inpatient Hospital Stay (HOSPITAL_BASED_OUTPATIENT_CLINIC_OR_DEPARTMENT_OTHER): Payer: Medicare PPO | Admitting: Oncology

## 2021-08-29 VITALS — BP 128/69 | HR 69 | Temp 96.0°F | Resp 16 | Ht 62.0 in | Wt 127.3 lb

## 2021-08-29 DIAGNOSIS — C9002 Multiple myeloma in relapse: Secondary | ICD-10-CM | POA: Diagnosis not present

## 2021-08-29 DIAGNOSIS — C9000 Multiple myeloma not having achieved remission: Secondary | ICD-10-CM | POA: Diagnosis not present

## 2021-08-29 DIAGNOSIS — Z85820 Personal history of malignant melanoma of skin: Secondary | ICD-10-CM | POA: Diagnosis not present

## 2021-08-29 DIAGNOSIS — I129 Hypertensive chronic kidney disease with stage 1 through stage 4 chronic kidney disease, or unspecified chronic kidney disease: Secondary | ICD-10-CM | POA: Diagnosis not present

## 2021-08-29 DIAGNOSIS — N189 Chronic kidney disease, unspecified: Secondary | ICD-10-CM | POA: Diagnosis not present

## 2021-08-29 DIAGNOSIS — Z862 Personal history of diseases of the blood and blood-forming organs and certain disorders involving the immune mechanism: Secondary | ICD-10-CM | POA: Diagnosis not present

## 2021-08-29 DIAGNOSIS — G629 Polyneuropathy, unspecified: Secondary | ICD-10-CM | POA: Diagnosis not present

## 2021-08-29 MED ORDER — ZOLEDRONIC ACID 4 MG/5ML IV CONC
3.0000 mg | Freq: Once | INTRAVENOUS | Status: AC
  Administered 2021-08-29: 3 mg via INTRAVENOUS
  Filled 2021-08-29: qty 3.75

## 2021-08-29 MED ORDER — SODIUM CHLORIDE 0.9 % IV SOLN
INTRAVENOUS | Status: DC
  Filled 2021-08-29: qty 250

## 2021-08-29 NOTE — Patient Instructions (Signed)

## 2021-08-30 ENCOUNTER — Ambulatory Visit: Payer: Medicare PPO

## 2021-08-30 DIAGNOSIS — R0602 Shortness of breath: Secondary | ICD-10-CM

## 2021-08-30 DIAGNOSIS — I491 Atrial premature depolarization: Secondary | ICD-10-CM

## 2021-08-31 ENCOUNTER — Encounter: Payer: Self-pay | Admitting: Oncology

## 2021-08-31 DIAGNOSIS — R0602 Shortness of breath: Secondary | ICD-10-CM | POA: Diagnosis not present

## 2021-08-31 DIAGNOSIS — R9431 Abnormal electrocardiogram [ECG] [EKG]: Secondary | ICD-10-CM | POA: Diagnosis not present

## 2021-09-08 ENCOUNTER — Other Ambulatory Visit: Payer: Self-pay | Admitting: Oncology

## 2021-09-08 DIAGNOSIS — J301 Allergic rhinitis due to pollen: Secondary | ICD-10-CM | POA: Diagnosis not present

## 2021-09-08 DIAGNOSIS — C9000 Multiple myeloma not having achieved remission: Secondary | ICD-10-CM

## 2021-09-12 ENCOUNTER — Encounter: Payer: Self-pay | Admitting: Nurse Practitioner

## 2021-09-12 ENCOUNTER — Ambulatory Visit: Payer: Medicare PPO | Admitting: Nurse Practitioner

## 2021-09-12 VITALS — BP 120/66 | HR 69 | Temp 98.3°F | Resp 16 | Ht 62.0 in | Wt 134.0 lb

## 2021-09-12 DIAGNOSIS — I340 Nonrheumatic mitral (valve) insufficiency: Secondary | ICD-10-CM

## 2021-09-12 DIAGNOSIS — I361 Nonrheumatic tricuspid (valve) insufficiency: Secondary | ICD-10-CM

## 2021-09-12 DIAGNOSIS — R0602 Shortness of breath: Secondary | ICD-10-CM

## 2021-09-12 DIAGNOSIS — I499 Cardiac arrhythmia, unspecified: Secondary | ICD-10-CM | POA: Diagnosis not present

## 2021-09-12 NOTE — Progress Notes (Signed)
[facility] ?[address] ?[place], [place] [address] ? ?Internal MEDICINE  ?Office Visit Note ? ?Patient Name: Rhonda [name] ? [id number]  ?[id number] ? ?Date of Service: 09/12/2021 ? ?Chief Complaint  ?Patient presents with  ? Follow-up  ? Hyperlipidemia  ? Hypertension  ? Hypothyroidism  ? Gastroesophageal Reflux  ? Results  ?  [place]  ? ? ?HPI ?Rhonda [name] presents for follow-up visit to discuss the results of her echocardiogram.  The results of her echocardiogram including mild left ventricular hypertrophy, ejection fraction of 70%, mild tricuspid regurgitation, mild mitral regurgitation and trace pulmonic regurgitation.  Her aortic valve is trileaflet and structurally normal.  She has normal global systolic function.  No valvular stenosis noted. ? ? ? ?Current Medication: ?Outpatient Encounter Medications as of 09/12/2021  ?Medication Sig  ? aspirin EC 81 MG tablet Take 81 mg by mouth daily.  ? Cyanocobalamin (B-12 PO) Take 500 mcg by mouth daily.   ? EPINEPHrine 0.3 mg/0.3 mL IJ SOAJ injection   ? fexofenadine (ALLEGRA) 30 MG tablet Take 30 mg by mouth daily.   ? levothyroxine (SYNTHROID) 50 MCG tablet TAKE 1 TABLET BY MOUTH DAILY BEFORE BREAKFAST  ? Multiple Vitamins-Minerals (CENTRUM SILVER PO) Take 1 tablet by mouth daily.  ? POMALYST 2 MG capsule TAKE 1 CAPSULE BY MOUTH EVERY DAY  ? triamcinolone (NASACORT) 55 MCG/ACT AERO nasal inhaler Place 2 sprays into the nose 2 (two) times daily as needed.  ? UNABLE TO FIND Inject 1 Dose as directed once a week. Allergy injections once a week  ? ?No facility-administered encounter medications on file as of 09/12/2021.  ? ? ?Surgical History: ?Past Surgical History:  ?Procedure Laterality Date  ? BREAST EXCISIONAL BIOPSY Right 15+ yrs ago  ? EXCISIONAL - NEG  ? CATARACT EXTRACTION W/ INTRAOCULAR LENS IMPLANT    ? CHOLECYSTECTOMY N/A 03/14/2017  ? Procedure: LAPAROSCOPIC CHOLECYSTECTOMY;  Surgeon: Florene Glen, MD;  Location: [facility];  Service: General;   Laterality: N/A;  ? COLONOSCOPY WITH PROPOFOL N/A 02/21/2015  ? Procedure: COLONOSCOPY WITH PROPOFOL;  Surgeon: Lucilla Lame, MD;  Location: [facility];  Service: Endoscopy;  Laterality: N/A;  ? [place]  ? POLYPECTOMY  02/21/2015  ? Procedure: POLYPECTOMY;  Surgeon: Lucilla Lame, MD;  Location: [facility];  Service: Endoscopy;;  ? ? ?Medical History: ?Past Medical History:  ?Diagnosis Date  ? Actinic keratosis   ? Arthritis   ? hands  ? Benign neoplasm of ascending colon   ? Cataract   ? GERD (gastroesophageal reflux disease)   ? Hyperlipemia   ? Hypertension 01/31/2015  ? Hypothyroidism   ? Melanoma ([facility]) 04/28/2018  ? R mid to distal ant lat thigh. MM, SS, tumor thickness 0.61m, antatomic level II. Excised: 05/13/18, margins free  ? Multiple myeloma ([facility] 04/18/2016  ? [facility] in [facility] ([facility] 04/18/2016  ? Osteoporosis   ? Shingles   ? Skin cancer   ? basal cell  ? Squamous cell carcinoma of skin 02/04/2014  ? Left pretibial. KA-like pattern  ? Squamous cell carcinoma of skin 09/15/2015  ? Right lat. superior ankle are. Superficial infiltration. Tx: EDC  ? Squamous cell carcinoma of skin 10/15/2016  ? Left medial mid lower leg. Tx: EDC  ? Squamous cell carcinoma of skin 11/27/2018  ? Right dorsum proximal forearm.  ? Squamous cell carcinoma of skin 09/09/2019  ? Right cheek lat. to mid nasolabial area. Tx: EDC  ? Squamous cell carcinoma of skin 12/09/2019  ?  right pretibial below knee  ? ? ?Family History: ?Family History  ?Problem Relation Age of Onset  ? Heart disease Mother   ? Stroke Father   ? Heart disease Brother   ? Kidney cancer Neg Hx   ? Prostate cancer Neg Hx   ? Bladder Cancer Neg Hx   ? Breast cancer Neg Hx   ? ? ?Social History  ? ?Socioeconomic History  ? Marital status: Widowed  ?  Spouse name: Not on file  ? Number of children: Not on file  ? Years of education: Not on file  ? Highest education level: Not on file  ?Occupational History  ? Not on file   ?Tobacco Use  ? Smoking status: Never  ? Smokeless tobacco: Never  ?Vaping Use  ? Vaping Use: Never used  ?Substance and Sexual Activity  ? Alcohol use: No  ?  Alcohol/week: 0.0 standard drinks  ? Drug use: No  ? Sexual activity: Not on file  ?Other Topics Concern  ? Not on file  ?Social History Narrative  ? Not on file  ? ?Social Determinants of Health  ? ?Financial Resource Strain: Not on file  ?Food Insecurity: Not on file  ?Transportation Needs: Not on file  ?Physical Activity: Not on file  ?Stress: Not on file  ?Social Connections: Not on file  ?Intimate Partner Violence: Not on file  ? ? ? ? ?Review of Systems  ?Constitutional:  Negative for chills, fatigue and fever.  ?Respiratory:  Positive for shortness of breath (mild and intermittent). Negative for cough, chest tightness and wheezing.   ?Cardiovascular: Negative.  Negative for chest pain and palpitations.  ? ?Vital Signs: ?BP 120/66   Pulse 69   Temp 98.3 ?F (36.8 ?C)   Resp 16   Ht '5\' 2"'  (1.575 m)   Wt 134 lb (60.8 kg)   SpO2 98%   BMI 24.51 kg/m?  ? ? ?Physical Exam ?Vitals reviewed.  ?Constitutional:   ?   Appearance: Normal appearance.  ?HENT:  ?   Head: Normocephalic and atraumatic.  ?Eyes:  ?   Pupils: Pupils are equal, round, and reactive to light.  ?Cardiovascular:  ?   Rate and Rhythm: Normal rate.  ?Pulmonary:  ?   Effort: Pulmonary effort is normal. No respiratory distress.  ?Neurological:  ?   Mental Status: She is alert and oriented to person, place, and time.  ?Psychiatric:     ?   Mood and Affect: Mood normal.     ?   Behavior: Behavior normal.  ? ? ? ? ? ?Assessment/Plan: ?1. Nonrheumatic mitral valve regurgitation ?Repeat echocardiogram in 3 years  ? ?2. Nonrheumatic tricuspid valve regurgitation ?See problem #1 ? ?3. SOB (shortness of breath) on exertion ?Mild intermittent and tolerable patient will call clinic if the SOB worsens or she has additional symptoms, low threshold for cardiology referral.  ? ?4. Irregular heart  rhythm ?Most likely due to regurgitation of tricuspid and mitral valves.  This problem is stable, not causing any issues such as chest pain or other symptoms.  We will continue to periodically monitor and patient will call the clinic if she develops any additional symptoms as previously discussed. ? ? ?General Counseling: Rhonda [name] verbalizes understanding of the findings of todays visit and agrees with plan of treatment. I have discussed any further diagnostic evaluation that may be needed or ordered today. We also reviewed her medications today. she has been encouraged to call the office with any questions or concerns that  should arise related to todays visit. ? ? ? ?No orders of the defined types were placed in this encounter. ? ? ?No orders of the defined types were placed in this encounter. ? ? ?Return for previously scheduled, CPE, Madylin Fairbank PCP in october this year. ? ? ?Total time spent:30 Minutes ?Time spent includes review of chart, medications, test results, and follow up plan with the patient.  ? ?[place] Controlled Substance Database was reviewed by me. ? ?This patient was seen by Jonetta Osgood, FNP-C in collaboration with Dr. Clayborn Bigness as a part of collaborative care agreement. ? ? ?Jermon Chalfant R. Valetta Fuller, MSN, FNP-C ?Internal medicine  ?

## 2021-09-22 DIAGNOSIS — J301 Allergic rhinitis due to pollen: Secondary | ICD-10-CM | POA: Diagnosis not present

## 2021-09-29 DIAGNOSIS — J301 Allergic rhinitis due to pollen: Secondary | ICD-10-CM | POA: Diagnosis not present

## 2021-10-05 ENCOUNTER — Other Ambulatory Visit: Payer: Self-pay | Admitting: Oncology

## 2021-10-05 DIAGNOSIS — C9000 Multiple myeloma not having achieved remission: Secondary | ICD-10-CM

## 2021-10-06 DIAGNOSIS — J301 Allergic rhinitis due to pollen: Secondary | ICD-10-CM | POA: Diagnosis not present

## 2021-10-10 ENCOUNTER — Other Ambulatory Visit: Payer: Self-pay | Admitting: *Deleted

## 2021-10-10 DIAGNOSIS — C9000 Multiple myeloma not having achieved remission: Secondary | ICD-10-CM

## 2021-10-11 ENCOUNTER — Encounter: Payer: Self-pay | Admitting: Oncology

## 2021-10-11 MED ORDER — POMALIDOMIDE 2 MG PO CAPS: ORAL_CAPSULE | ORAL | 3 refills | Status: DC

## 2021-10-13 DIAGNOSIS — J301 Allergic rhinitis due to pollen: Secondary | ICD-10-CM | POA: Diagnosis not present

## 2021-10-16 DIAGNOSIS — J301 Allergic rhinitis due to pollen: Secondary | ICD-10-CM | POA: Diagnosis not present

## 2021-10-18 ENCOUNTER — Encounter: Payer: Self-pay | Admitting: Oncology

## 2021-10-27 DIAGNOSIS — J301 Allergic rhinitis due to pollen: Secondary | ICD-10-CM | POA: Diagnosis not present

## 2021-11-02 ENCOUNTER — Other Ambulatory Visit: Payer: Self-pay | Admitting: Oncology

## 2021-11-02 DIAGNOSIS — C9000 Multiple myeloma not having achieved remission: Secondary | ICD-10-CM

## 2021-11-03 DIAGNOSIS — J301 Allergic rhinitis due to pollen: Secondary | ICD-10-CM | POA: Diagnosis not present

## 2021-11-08 ENCOUNTER — Encounter: Payer: Self-pay | Admitting: Oncology

## 2021-11-08 ENCOUNTER — Other Ambulatory Visit: Payer: Self-pay

## 2021-11-09 DIAGNOSIS — H40153 Residual stage of open-angle glaucoma, bilateral: Secondary | ICD-10-CM | POA: Diagnosis not present

## 2021-11-10 DIAGNOSIS — J301 Allergic rhinitis due to pollen: Secondary | ICD-10-CM | POA: Diagnosis not present

## 2021-11-24 DIAGNOSIS — J301 Allergic rhinitis due to pollen: Secondary | ICD-10-CM | POA: Diagnosis not present

## 2021-11-27 ENCOUNTER — Other Ambulatory Visit: Payer: Self-pay

## 2021-11-27 ENCOUNTER — Inpatient Hospital Stay: Payer: Medicare PPO | Attending: Oncology

## 2021-11-27 DIAGNOSIS — Z862 Personal history of diseases of the blood and blood-forming organs and certain disorders involving the immune mechanism: Secondary | ICD-10-CM | POA: Diagnosis not present

## 2021-11-27 DIAGNOSIS — I129 Hypertensive chronic kidney disease with stage 1 through stage 4 chronic kidney disease, or unspecified chronic kidney disease: Secondary | ICD-10-CM | POA: Insufficient documentation

## 2021-11-27 DIAGNOSIS — C9000 Multiple myeloma not having achieved remission: Secondary | ICD-10-CM

## 2021-11-27 DIAGNOSIS — G629 Polyneuropathy, unspecified: Secondary | ICD-10-CM | POA: Diagnosis not present

## 2021-11-27 DIAGNOSIS — C9002 Multiple myeloma in relapse: Secondary | ICD-10-CM | POA: Insufficient documentation

## 2021-11-27 DIAGNOSIS — N189 Chronic kidney disease, unspecified: Secondary | ICD-10-CM | POA: Diagnosis not present

## 2021-11-27 DIAGNOSIS — Z85820 Personal history of malignant melanoma of skin: Secondary | ICD-10-CM | POA: Insufficient documentation

## 2021-11-27 LAB — BASIC METABOLIC PANEL
Anion gap: 9 (ref 5–15)
BUN: 24 mg/dL — ABNORMAL HIGH (ref 8–23)
CO2: 24 mmol/L (ref 22–32)
Calcium: 9 mg/dL (ref 8.9–10.3)
Chloride: 103 mmol/L (ref 98–111)
Creatinine, Ser: 0.95 mg/dL (ref 0.44–1.00)
GFR, Estimated: 58 mL/min — ABNORMAL LOW (ref >60)
Glucose, Bld: 102 mg/dL — ABNORMAL HIGH (ref 70–99)
Potassium: 4.1 mmol/L (ref 3.5–5.1)
Sodium: 136 mmol/L (ref 135–145)

## 2021-11-27 LAB — CBC WITH DIFFERENTIAL/PLATELET
Abs Immature Granulocytes: 0.01 10*3/uL (ref 0.00–0.07)
Basophils Absolute: 0.2 10*3/uL — ABNORMAL HIGH (ref 0.0–0.1)
Basophils Relative: 4 %
Eosinophils Absolute: 0 10*3/uL (ref 0.0–0.5)
Eosinophils Relative: 1 %
HCT: 39.9 % (ref 36.0–46.0)
Hemoglobin: 13.4 g/dL (ref 12.0–15.0)
Immature Granulocytes: 0 %
Lymphocytes Relative: 29 %
Lymphs Abs: 1.3 10*3/uL (ref 0.7–4.0)
MCH: 32 pg (ref 26.0–34.0)
MCHC: 33.6 g/dL (ref 30.0–36.0)
MCV: 95.2 fL (ref 80.0–100.0)
Monocytes Absolute: 1.4 10*3/uL — ABNORMAL HIGH (ref 0.1–1.0)
Monocytes Relative: 30 %
Neutro Abs: 1.7 10*3/uL (ref 1.7–7.7)
Neutrophils Relative %: 36 %
Platelets: 239 10*3/uL (ref 150–400)
RBC: 4.19 MIL/uL (ref 3.87–5.11)
RDW: 13.2 % (ref 11.5–15.5)
WBC: 4.6 10*3/uL (ref 4.0–10.5)
nRBC: 0 % (ref 0.0–0.2)

## 2021-11-28 ENCOUNTER — Ambulatory Visit: Payer: Medicare PPO

## 2021-11-28 ENCOUNTER — Ambulatory Visit: Payer: Medicare PPO | Admitting: Oncology

## 2021-11-28 LAB — IGG, IGA, IGM
IgA: 67 mg/dL (ref 64–422)
IgG (Immunoglobin G), Serum: 592 mg/dL (ref 586–1602)
IgM (Immunoglobulin M), Srm: 20 mg/dL — ABNORMAL LOW (ref 26–217)

## 2021-11-28 LAB — KAPPA/LAMBDA LIGHT CHAINS
Kappa free light chain: 18.6 mg/L (ref 3.3–19.4)
Kappa, lambda light chain ratio: 0.03 — ABNORMAL LOW (ref 0.26–1.65)
Lambda free light chains: 574.2 mg/L — ABNORMAL HIGH (ref 5.7–26.3)

## 2021-11-30 LAB — IMMUNOFIXATION ELECTROPHORESIS
IgA: 62 mg/dL — ABNORMAL LOW (ref 64–422)
IgG (Immunoglobin G), Serum: 605 mg/dL (ref 586–1602)
IgM (Immunoglobulin M), Srm: 20 mg/dL — ABNORMAL LOW (ref 26–217)
Total Protein ELP: 5.9 g/dL — ABNORMAL LOW (ref 6.0–8.5)

## 2021-12-01 DIAGNOSIS — J301 Allergic rhinitis due to pollen: Secondary | ICD-10-CM | POA: Diagnosis not present

## 2021-12-05 ENCOUNTER — Other Ambulatory Visit: Payer: Self-pay | Admitting: Pharmacist

## 2021-12-05 ENCOUNTER — Inpatient Hospital Stay (HOSPITAL_BASED_OUTPATIENT_CLINIC_OR_DEPARTMENT_OTHER): Payer: Medicare PPO | Admitting: Oncology

## 2021-12-05 ENCOUNTER — Inpatient Hospital Stay: Payer: Medicare PPO

## 2021-12-05 ENCOUNTER — Encounter: Payer: Self-pay | Admitting: Oncology

## 2021-12-05 VITALS — BP 136/68 | HR 87 | Temp 97.9°F | Resp 16 | Ht 62.0 in | Wt 129.4 lb

## 2021-12-05 DIAGNOSIS — G629 Polyneuropathy, unspecified: Secondary | ICD-10-CM | POA: Diagnosis not present

## 2021-12-05 DIAGNOSIS — Z862 Personal history of diseases of the blood and blood-forming organs and certain disorders involving the immune mechanism: Secondary | ICD-10-CM | POA: Diagnosis not present

## 2021-12-05 DIAGNOSIS — Z85820 Personal history of malignant melanoma of skin: Secondary | ICD-10-CM | POA: Diagnosis not present

## 2021-12-05 DIAGNOSIS — C9000 Multiple myeloma not having achieved remission: Secondary | ICD-10-CM

## 2021-12-05 DIAGNOSIS — I129 Hypertensive chronic kidney disease with stage 1 through stage 4 chronic kidney disease, or unspecified chronic kidney disease: Secondary | ICD-10-CM | POA: Diagnosis not present

## 2021-12-05 DIAGNOSIS — C9002 Multiple myeloma in relapse: Secondary | ICD-10-CM | POA: Diagnosis not present

## 2021-12-05 DIAGNOSIS — N189 Chronic kidney disease, unspecified: Secondary | ICD-10-CM | POA: Diagnosis not present

## 2021-12-05 MED ORDER — POMALIDOMIDE 2 MG PO CAPS: 2.0000 mg | ORAL_CAPSULE | Freq: Every day | ORAL | 0 refills | Status: DC

## 2021-12-05 MED ORDER — ZOLEDRONIC ACID 4 MG/5ML IV CONC
3.0000 mg | Freq: Once | INTRAVENOUS | Status: AC
  Administered 2021-12-05: 3 mg via INTRAVENOUS
  Filled 2021-12-05: qty 3.75

## 2021-12-05 MED ORDER — SODIUM CHLORIDE 0.9 % IV SOLN
INTRAVENOUS | Status: DC
  Filled 2021-12-05: qty 250

## 2021-12-11 ENCOUNTER — Telehealth: Payer: Self-pay

## 2021-12-11 NOTE — Telephone Encounter (Signed)
Can one of you please advise. Thank you

## 2021-12-11 NOTE — Telephone Encounter (Signed)
Spoke with patient. Right now she states she is getting better. She does not want to be seen at the moment. I did advise if her SOB gets worse she needs to go to the ED. She has not taken anymore pomalyst and was advised not to until wed when Dr. Woodfin Ganja comes back to see what he wants to do and see if the reaction completely subsides. The reaction started on Friday the day after getting back on pomalyst after 3 weeks of being off of it. Please advise.

## 2021-12-13 NOTE — Telephone Encounter (Signed)
Abbie, Can you reach out to Ms. Kenney and schedule an appt for her to come in to see me to discuss her reaction to the Pomalyst and the plan for her moving forward?

## 2021-12-15 DIAGNOSIS — J301 Allergic rhinitis due to pollen: Secondary | ICD-10-CM | POA: Diagnosis not present

## 2021-12-19 ENCOUNTER — Inpatient Hospital Stay: Payer: Medicare PPO | Attending: Oncology | Admitting: Pharmacist

## 2021-12-19 DIAGNOSIS — N189 Chronic kidney disease, unspecified: Secondary | ICD-10-CM | POA: Insufficient documentation

## 2021-12-19 DIAGNOSIS — G629 Polyneuropathy, unspecified: Secondary | ICD-10-CM | POA: Insufficient documentation

## 2021-12-19 DIAGNOSIS — I129 Hypertensive chronic kidney disease with stage 1 through stage 4 chronic kidney disease, or unspecified chronic kidney disease: Secondary | ICD-10-CM | POA: Insufficient documentation

## 2021-12-19 DIAGNOSIS — Z85820 Personal history of malignant melanoma of skin: Secondary | ICD-10-CM | POA: Diagnosis not present

## 2021-12-19 DIAGNOSIS — C9000 Multiple myeloma not having achieved remission: Secondary | ICD-10-CM

## 2021-12-19 DIAGNOSIS — C9002 Multiple myeloma in relapse: Secondary | ICD-10-CM | POA: Diagnosis not present

## 2021-12-19 DIAGNOSIS — Z862 Personal history of diseases of the blood and blood-forming organs and certain disorders involving the immune mechanism: Secondary | ICD-10-CM | POA: Diagnosis not present

## 2021-12-21 ENCOUNTER — Ambulatory Visit: Payer: Medicare PPO | Admitting: Dermatology

## 2021-12-21 DIAGNOSIS — D229 Melanocytic nevi, unspecified: Secondary | ICD-10-CM

## 2021-12-21 DIAGNOSIS — L821 Other seborrheic keratosis: Secondary | ICD-10-CM

## 2021-12-21 DIAGNOSIS — Z85820 Personal history of malignant melanoma of skin: Secondary | ICD-10-CM | POA: Diagnosis not present

## 2021-12-21 DIAGNOSIS — L814 Other melanin hyperpigmentation: Secondary | ICD-10-CM | POA: Diagnosis not present

## 2021-12-21 DIAGNOSIS — D1800 Hemangioma unspecified site: Secondary | ICD-10-CM

## 2021-12-21 DIAGNOSIS — Z1283 Encounter for screening for malignant neoplasm of skin: Secondary | ICD-10-CM | POA: Diagnosis not present

## 2021-12-21 DIAGNOSIS — Z85828 Personal history of other malignant neoplasm of skin: Secondary | ICD-10-CM

## 2021-12-21 DIAGNOSIS — L570 Actinic keratosis: Secondary | ICD-10-CM

## 2021-12-21 DIAGNOSIS — L820 Inflamed seborrheic keratosis: Secondary | ICD-10-CM

## 2021-12-21 DIAGNOSIS — L578 Other skin changes due to chronic exposure to nonionizing radiation: Secondary | ICD-10-CM | POA: Diagnosis not present

## 2021-12-21 MED ORDER — PREDNISONE 10 MG PO TABS: ORAL_TABLET | ORAL | 0 refills | Status: DC

## 2021-12-21 NOTE — Progress Notes (Signed)
[facility]  Telephone:([phone] [phone]  Patient Care Team: Jonetta Osgood, NP as PCP - General (Nurse Practitioner) Lloyd Huger, MD as Consulting Physician (Oncology)   Name of the patient: Rhonda [name]  [id number]  [birth year]   Date of visit: 12/21/21  HPI: Patient is a 88 y.o. female with multiple myeloma. Currently treated with pomalidomide and dexamethasone. Recently patient was off of her pomalidomide for 3 weeks and when she restarted, she has a reaction. She stopped her pomalidomide after 2 doses.  Reason for Consult: Patient here today to discuss her reaction to pomalidomide   PAST MEDICAL HISTORY: Past Medical History:  Diagnosis Date   Actinic keratosis    Arthritis    hands   Benign neoplasm of ascending colon    Cataract    GERD (gastroesophageal reflux disease)    Hyperlipemia    Hypertension 01/31/2015   Hypothyroidism    Melanoma ([facility]) 04/28/2018   R mid to distal ant lat thigh. MM, SS, tumor thickness 0.21m, antatomic level II. Excised: 05/13/18, margins free   [place] [facility] ([facility] 04/18/2016   Multiple [facility]oma in remission ([facility] 04/18/2016   Osteoporosis    Shingles    Skin cancer    basal cell   Squamous cell carcinoma of skin 02/04/2014   Left pretibial. KA-like pattern   Squamous cell carcinoma of skin 09/15/2015   Right lat. superior ankle are. Superficial infiltration. Tx: EDC   Squamous cell carcinoma of skin 10/15/2016   Left medial mid lower leg. Tx: EDC   Squamous cell carcinoma of skin 11/27/2018   Right dorsum proximal forearm.   Squamous cell carcinoma of skin 09/09/2019   Right cheek lat. to mid nasolabial area. Tx: EDC   Squamous cell carcinoma of skin 12/09/2019   right pretibial below knee    HEMATOLOGY/ONCOLOGY HISTORY:  Oncology History  Multiple myeloma ([facility]  04/18/2016 Initial Diagnosis   Multiple myeloma ([facility])     ALLERGIES:  is allergic to revlimid  [lenalidomide].  MEDICATIONS:  Current Outpatient Medications  Medication Sig Dispense Refill   aspirin EC 81 MG tablet Take 81 mg by mouth daily.     Cyanocobalamin (B-12 PO) Take 500 mcg by mouth daily.      EPINEPHrine 0.3 mg/0.3 mL IJ SOAJ injection      fexofenadine (ALLEGRA) 30 MG tablet Take 30 mg by mouth daily.      levothyroxine (SYNTHROID) 50 MCG tablet TAKE 1 TABLET BY MOUTH DAILY BEFORE BREAKFAST 90 tablet 3   Multiple Vitamins-Minerals (CENTRUM SILVER PO) Take 1 tablet by mouth daily.     pomalidomide (POMALYST) 2 MG capsule Take 1 capsule (2 mg total) by mouth daily. Take for 21 days, then hold for 7 days. Repeat every 28 days. 21 capsule 0   triamcinolone (NASACORT) 55 MCG/ACT AERO nasal inhaler Place 2 sprays into the nose 2 (two) times daily as needed. (Patient not taking: Reported on 12/05/2021)     UNABLE TO FIND Inject 1 Dose as directed once a week. Allergy injections once a week     No current facility-administered medications for this visit.    VITAL SIGNS: There were no vitals taken for this visit. There were no vitals filed for this visit.  Estimated body mass index is 23.67 kg/m as calculated from the following:   Height as of 12/05/21: 5' 2" (1.575 m).   Weight as of 12/05/21: 58.7 kg (129 lb 6.4 oz).  LABS: CBC:  Component Value Date/Time   WBC 4.6 11/27/2021 1314   HGB 13.4 11/27/2021 1314   HCT 39.9 11/27/2021 1314   PLT 239 11/27/2021 1314   MCV 95.2 11/27/2021 1314   NEUTROABS 1.7 11/27/2021 1314   LYMPHSABS 1.3 11/27/2021 1314   MONOABS 1.4 (H) 11/27/2021 1314   EOSABS 0.0 11/27/2021 1314   BASOSABS 0.2 (H) 11/27/2021 1314   Comprehensive Metabolic Panel:    Component Value Date/Time   NA 136 11/27/2021 1314   K 4.1 11/27/2021 1314   CL 103 11/27/2021 1314   CO2 24 11/27/2021 1314   BUN 24 (H) 11/27/2021 1314   CREATININE 0.95 11/27/2021 1314   GLUCOSE 102 (H) 11/27/2021 1314   CALCIUM 9.0 11/27/2021 1314   AST 22 10/11/2020 1255    ALT 15 10/11/2020 1255   ALKPHOS 75 10/11/2020 1255   BILITOT 0.5 10/11/2020 1255   PROT 6.3 (L) 10/11/2020 1255   ALBUMIN 3.9 10/11/2020 1255     Present during today's visit: patient only  Assessment: Patient reports that she restarted on pomalidomide on 6/29, after two doses she reported some redness on her scalp, feeling warm, and itching in the creases of her arms and groin She stop her pomalidomide after this reaction and began taking Benadryl  She also reported feeling some shortness of breath during The felt that she returned to normal within a week   Plan: Spoke the Dr. Finnegan and patient and we plan on reinitiating her to pomalidomide with premedications. She will take the following prior to her pomalidomide: Prednisone (taper- 7 days) Famotidine 20mg daily Fexofenadine 180mg daily (this is her current oral antihistamine See will start this on Monday 12/25/21  Follow-up plan: daily telephone visits next week  Thank you for allowing me to participate in the care of this very pleasant patient.   Time Total: 15 mins  Visit consisted of counseling and education on dealing with issues of symptom management in the setting of serious and potentially life-threatening illness.Greater than 50%  of this time was spent counseling and coordinating care related to the above assessment and plan.  Signed by: Alyson N. Leonard, PharmD, BCPS, BCOP, CPP Hematology/Oncology Clinical Pharmacist Practitioner [place]/DB/AP [facility] [phone]  12/21/2021 11:50 AM  

## 2021-12-21 NOTE — Progress Notes (Signed)
Follow-Up Visit   Subjective  Rhonda [name] is a 88 y.o. female who presents for the following: Annual Exam (Mole check ). Hx of Melanoma on the right mid to distal ant lat thigh-04/28/2018. The patient presents for Total-Body Skin Exam (TBSE) for skin cancer screening and mole check.  The patient has spots, moles and lesions to be evaluated, some may be new or changing and the patient has concerns that these could be cancer.   The following portions of the chart were reviewed this encounter and updated as appropriate:   Tobacco  Allergies  Meds  Problems  Med Hx  Surg Hx  Fam Hx     Review of Systems:  No other skin or systemic complaints except as noted in HPI or Assessment and Plan.  Objective  Well appearing patient in no apparent distress; mood and affect are within normal limits.  A full examination was performed including scalp, head, eyes, ears, nose, lips, neck, chest, axillae, abdomen, back, buttocks, bilateral upper extremities, bilateral lower extremities, hands, feet, fingers, toes, fingernails, and toenails. All findings within normal limits unless otherwise noted below.  left temple x 2, right cheek x 3 (5) Erythematous thin papules/macules with gritty scale.   right neck post auricular, left hand, right forearm (3) Stuck-on, waxy, tan-brown papule or plaque --Discussed benign etiology and prognosis.    Assessment & Plan  AK (actinic keratosis) (5) left temple x 2, right cheek x 3  Actinic keratoses are precancerous spots that appear secondary to cumulative UV radiation exposure/sun exposure over time. They are chronic with expected duration over 1 year. A portion of actinic keratoses will progress to squamous cell carcinoma of the skin. It is not possible to reliably predict which spots will progress to skin cancer and so treatment is recommended to prevent development of skin cancer.  Recommend daily broad spectrum sunscreen SPF 30+ to sun-exposed areas,  reapply every 2 hours as needed.  Recommend staying in the shade or wearing long sleeves, sun glasses (UVA+UVB protection) and wide brim hats (4-inch brim around the entire circumference of the hat). Call for new or changing lesions.   Destruction of lesion - left temple x 2, right cheek x 3 Complexity: simple   Destruction method: cryotherapy   Informed consent: discussed and consent obtained   Timeout:  patient name, date of birth, surgical site, and procedure verified Lesion destroyed using liquid nitrogen: Yes   Region frozen until ice ball extended beyond lesion: Yes   Outcome: patient tolerated procedure well with no complications   Post-procedure details: wound care instructions given    Inflamed seborrheic keratosis (3) right neck post auricular, left hand, right forearm  Symptomatic, irritating, patient would like treated.   Destruction of lesion - right neck post auricular, left hand, right forearm Complexity: simple   Destruction method: cryotherapy   Informed consent: discussed and consent obtained   Timeout:  patient name, date of birth, surgical site, and procedure verified Lesion destroyed using liquid nitrogen: Yes   Region frozen until ice ball extended beyond lesion: Yes   Outcome: patient tolerated procedure well with no complications   Post-procedure details: wound care instructions given    Lentigines - Scattered tan macules - Due to sun exposure - Benign-appearing, observe - Recommend daily broad spectrum sunscreen SPF 30+ to sun-exposed areas, reapply every 2 hours as needed. - Call for any changes  Seborrheic Keratoses - Stuck-on, waxy, tan-brown papules and/or plaques  - Benign-appearing - Discussed benign etiology  and prognosis. - Observe - Call for any changes  Melanocytic Nevi - Tan-brown and/or pink-flesh-colored symmetric macules and papules - Benign appearing on exam today - Observation - Call clinic for new or changing moles - Recommend  daily use of broad spectrum spf 30+ sunscreen to sun-exposed areas.   Hemangiomas - Red papules - Discussed benign nature - Observe - Call for any changes  Actinic Damage - Chronic condition, secondary to cumulative UV/sun exposure - diffuse scaly erythematous macules with underlying dyspigmentation - Recommend daily broad spectrum sunscreen SPF 30+ to sun-exposed areas, reapply every 2 hours as needed.  - Staying in the shade or wearing long sleeves, sun glasses (UVA+UVB protection) and wide brim hats (4-inch brim around the entire circumference of the hat) are also recommended for sun protection.  - Call for new or changing lesions.  History of Squamous Cell Carcinoma of the Skin Multiple see history - No evidence of recurrence today - No lymphadenopathy - Recommend regular full body skin exams - Recommend daily broad spectrum sunscreen SPF 30+ to sun-exposed areas, reapply every 2 hours as needed.  - Call if any new or changing lesions are noted between office visits   History of Melanoma-Invasive  Right mid to distal ant lat thigh 04/28/2021 - No evidence of recurrence today - No lymphadenopathy - Recommend regular full body skin exams - Recommend daily broad spectrum sunscreen SPF 30+ to sun-exposed areas, reapply every 2 hours as needed.  - Call if any new or changing lesions are noted between office visits   Skin cancer screening performed today.   Return in about 6 months (around 06/23/2022) for TBSE, hx of Melanoma.  IMarye Round, CMA, am acting as scribe for Sarina Ser, MD .  Documentation: I have reviewed the above documentation for accuracy and completeness, and I agree with the above.  Sarina Ser, MD

## 2021-12-21 NOTE — Patient Instructions (Addendum)
Cryotherapy Aftercare  Wash gently with soap and water everyday.   Apply Vaseline and Band-Aid daily until healed.     Due to recent changes in healthcare laws, you may see results of your pathology and/or laboratory studies on MyChart before the doctors have had a chance to review them. We understand that in some cases there may be results that are confusing or concerning to you. Please understand that not all results are received at the same time and often the doctors may need to interpret multiple results in order to provide you with the best plan of care or course of treatment. Therefore, we ask that you please give us 2 business days to thoroughly review all your results before contacting the office for clarification. Should we see a critical lab result, you will be contacted sooner.   If You Need Anything After Your Visit  If you have any questions or concerns for your doctor, please call our main line at [phone] and press option 4 to reach your doctor's medical assistant. If no one answers, please leave a voicemail as directed and we will return your call as soon as possible. Messages left after 4 pm will be answered the following business day.   You may also send us a message via MyChart. We typically respond to MyChart messages within 1-2 business days.  For prescription refills, please ask your pharmacy to contact our office. Our fax number is [phone].  If you have an urgent issue when the clinic is closed that cannot wait until the next business day, you can page your doctor at the number below.    Please note that while we do our best to be available for urgent issues outside of office hours, we are not available 24/7.   If you have an urgent issue and are unable to reach us, you may choose to seek medical care at your doctor's office, retail clinic, urgent care center, or emergency room.  If you have a medical emergency, please immediately call 911 or go to the  emergency department.  Pager Numbers  - Dr. Kowalski: [phone]  - Dr. Moye: [phone]  - Dr. Stewart: [phone]  In the event of inclement weather, please call our main line at [phone] for an update on the status of any delays or closures.  Dermatology Medication Tips: Please keep the boxes that topical medications come in in order to help keep track of the instructions about where and how to use these. Pharmacies typically print the medication instructions only on the boxes and not directly on the medication tubes.   If your medication is too expensive, please contact our office at [phone] option 4 or send us a message through MyChart.   We are unable to tell what your co-pay for medications will be in advance as this is different depending on your insurance coverage. However, we may be able to find a substitute medication at lower cost or fill out paperwork to get insurance to cover a needed medication.   If a prior authorization is required to get your medication covered by your insurance company, please allow us 1-2 business days to complete this process.  Drug prices often vary depending on where the prescription is filled and some pharmacies may offer cheaper prices.  The website www.goodrx.com contains coupons for medications through different pharmacies. The prices here do not account for what the cost may be with help from insurance (it may be cheaper with your insurance), but the website can   give you the price if you did not use any insurance.  - You can print the associated coupon and take it with your prescription to the pharmacy.  - You may also stop by our office during regular business hours and pick up a GoodRx coupon card.  - If you need your prescription sent electronically to a different pharmacy, notify our office through [facility] MyChart or by phone at [phone] option 4.     Si Usted Necesita Algo Despus de Su Visita  Tambin puede  enviarnos un mensaje a travs de MyChart. Por lo general respondemos a los mensajes de MyChart en el transcurso de 1 a 2 das hbiles.  Para renovar recetas, por favor pida a su farmacia que se ponga en contacto con nuestra oficina. Nuestro nmero de fax es el [phone].  Si tiene un asunto urgente cuando la clnica est cerrada y que no puede esperar hasta el siguiente da hbil, puede llamar/localizar a su doctor(a) al nmero que aparece a continuacin.   Por favor, tenga en cuenta que aunque hacemos todo lo posible para estar disponibles para asuntos urgentes fuera del horario de oficina, no estamos disponibles las 24 horas del da, los 7 das de la semana.   Si tiene un problema urgente y no puede comunicarse con nosotros, puede optar por buscar atencin mdica  en el consultorio de su doctor(a), en una clnica privada, en un centro de atencin urgente o en una sala de emergencias.  Si tiene una emergencia mdica, por favor llame inmediatamente al 911 o vaya a la sala de emergencias.  Nmeros de bper  - Dr. Kowalski: [phone]  - Dra. Moye: [phone]  - Dra. Stewart: [phone]  En caso de inclemencias del tiempo, por favor llame a nuestra lnea principal al [phone] para una actualizacin sobre el estado de cualquier retraso o cierre.  Consejos para la medicacin en dermatologa: Por favor, guarde las cajas en las que vienen los medicamentos de uso tpico para ayudarle a seguir las instrucciones sobre dnde y cmo usarlos. Las farmacias generalmente imprimen las instrucciones del medicamento slo en las cajas y no directamente en los tubos del medicamento.   Si su medicamento es muy caro, por favor, pngase en contacto con nuestra oficina llamando al [phone] y presione la opcin 4 o envenos un mensaje a travs de MyChart.   No podemos decirle cul ser su copago por los medicamentos por adelantado ya que esto es diferente dependiendo de la cobertura de su seguro.  Sin embargo, es posible que podamos encontrar un medicamento sustituto a menor costo o llenar un formulario para que el seguro cubra el medicamento que se considera necesario.   Si se requiere una autorizacin previa para que su compaa de seguros cubra su medicamento, por favor permtanos de 1 a 2 das hbiles para completar este proceso.  Los precios de los medicamentos varan con frecuencia dependiendo del lugar de dnde se surte la receta y alguna farmacias pueden ofrecer precios ms baratos.  El sitio web www.goodrx.com tiene cupones para medicamentos de diferentes farmacias. Los precios aqu no tienen en cuenta lo que podra costar con la ayuda del seguro (puede ser ms barato con su seguro), pero el sitio web puede darle el precio si no utiliz ningn seguro.  - Puede imprimir el cupn correspondiente y llevarlo con su receta a la farmacia.  - Tambin puede pasar por nuestra oficina durante el horario de atencin regular y recoger una tarjeta de cupones de GoodRx.  -   Si necesita que su receta se enve electrnicamente a una farmacia diferente, informe a nuestra oficina a travs de MyChart de [facility] o por telfono llamando al [phone] y presione la opcin 4.  

## 2021-12-22 DIAGNOSIS — J301 Allergic rhinitis due to pollen: Secondary | ICD-10-CM | POA: Diagnosis not present

## 2021-12-23 ENCOUNTER — Other Ambulatory Visit: Payer: Self-pay | Admitting: Oncology

## 2021-12-23 DIAGNOSIS — C9000 Multiple myeloma not having achieved remission: Secondary | ICD-10-CM

## 2021-12-25 ENCOUNTER — Inpatient Hospital Stay (HOSPITAL_BASED_OUTPATIENT_CLINIC_OR_DEPARTMENT_OTHER): Payer: Medicare PPO | Admitting: Pharmacist

## 2021-12-25 DIAGNOSIS — J3089 Other allergic rhinitis: Secondary | ICD-10-CM

## 2021-12-25 NOTE — Telephone Encounter (Signed)
Patient restarted Pomalyst today. Future cycles will be based on how she reacted to restarting treatment. Refill will be sent in during her off week, which will be around 01/15/22, if needed.

## 2021-12-25 NOTE — Progress Notes (Signed)
[facility]  Telephone:([phone] [phone]    I connected withNAME@ on 12/25/21 at  1:00 PM EDT by telephone and verified that I am speaking with the correct person using two identifiers.  Name of the patient: Rhonda [name]  [id number]  1933-10-12   Location: Patient: at home Provider: in office   I discussed the limitations, risks, security and privacy concerns of performing an evaluation and management service by telephone and the availability of in person appointments. I also discussed with the patient that there may be a patient responsible charge related to this service. The patient expressed understanding and agreed to proceed.  HPI: Patient is a 88 y.o. female with multiple myeloma. Currently treated with pomalidomide and dexamethasone. Recently patient was off of her pomalidomide for 3 weeks and when she restarted, she has a reaction. She stopped her pomalidomide after 2 doses. Today 12/25/21 patient restarted her pomalidomide, pre-medicating with prednisone, famotidine, and fexofenadine.   Reason for Consult: Oral chemotherapy follow-up for pomalidomide restart therapy.   PAST MEDICAL HISTORY: Past Medical History:  Diagnosis Date   Actinic keratosis    Arthritis    hands   Benign neoplasm of ascending colon    Cataract    GERD (gastroesophageal reflux disease)    Hyperlipemia    Hypertension 01/31/2015   Hypothyroidism    Melanoma ([facility]) 04/28/2018   R mid to distal ant lat thigh. MM, SS, tumor thickness 0.59m, antatomic level II. Excised: 05/13/18, margins free   [place] [facility] [place]   [facility] 04/18[place]   Osteoporosis    Shingles    Skin cancer    basal cell   Squamous cell carcinoma of skin 02/04/2014   Left pretibial. KA-like pattern   Squamous cell carcinoma of skin 09/15/2015   Right lat. superior ankle are. Superficial infiltration. Tx: EDC    Squamous cell carcinoma of skin 10/15/2016   Left medial mid lower leg. Tx: EDC   Squamous cell carcinoma of skin 11/27/2018   Right dorsum proximal forearm.   Squamous cell carcinoma of skin 09/09/2019   Right cheek lat. to mid nasolabial area. Tx: EDC   Squamous cell carcinoma of skin 12/09/2019   right pretibial below knee    HEMATOLOGY/ONCOLOGY HISTORY:  Oncology History  [facility] ([facility]  04/18/2016 Initial Diagnosis   Multiple myeloma ([facility])     ALLERGIES:  is allergic to revlimid [lenalidomide].  MEDICATIONS:  Current Outpatient Medications  Medication Sig Dispense Refill   aspirin EC 81 MG tablet Take 81 mg by mouth daily.     Cyanocobalamin (B-12 PO) Take 500 mcg by mouth daily.      EPINEPHrine 0.3 mg/0.3 mL IJ SOAJ injection      fexofenadine (ALLEGRA) 30 MG tablet Take 30 mg by mouth daily.      levothyroxine (SYNTHROID) 50 MCG tablet TAKE 1 TABLET BY MOUTH DAILY BEFORE BREAKFAST 90 tablet 3   Multiple Vitamins-Minerals (CENTRUM SILVER PO) Take 1 tablet by mouth daily.     pomalidomide (POMALYST) 2 MG capsule Take 1 capsule (2 mg total) by mouth daily. Take for 21 days, then hold for 7 days. Repeat every 28 days. 21 capsule 0   predniSONE (DELTASONE) 10 MG tablet Take 6 tablets (631m by mouth x1day, then 5 tablets (5073mx1 day, then 4 tablets (15m38m1day, then 3 tablets (30mg54mday, then 2 tablets (20mg)50may, then 1 tablets (10mg) 33my, then 1 tablets (10mg) x33m.  Take with breakfast 21 tablet 0   triamcinolone (NASACORT) 55 MCG/ACT AERO nasal inhaler Place 2 sprays into the nose 2 (two) times daily as needed. (Patient not taking: Reported on 12/05/2021)     UNABLE TO FIND Inject 1 Dose as directed once a week. Allergy injections once a week     No current facility-administered medications for this visit.    VITAL SIGNS: There were no vitals taken for this visit. There were no vitals filed for this visit.  Estimated body mass index is 23.67 kg/m as  calculated from the following:   Height as of 12/05/21: '5\' 2"'  (1.575 m).   Weight as of 12/05/21: 58.7 kg (129 lb 6.4 oz).  LABS: CBC:    Component Value Date/Time   WBC 4.6 11/27/2021 1314   HGB 13.4 11/27/2021 1314   HCT 39.9 11/27/2021 1314   PLT 239 11/27/2021 1314   MCV 95.2 11/27/2021 1314   NEUTROABS 1.7 11/27/2021 1314   LYMPHSABS 1.3 11/27/2021 1314   MONOABS 1.4 (H) 11/27/2021 1314   EOSABS 0.0 11/27/2021 1314   BASOSABS 0.2 (H) 11/27/2021 1314   Comprehensive Metabolic Panel:    Component Value Date/Time   NA 136 11/27/2021 1314   K 4.1 11/27/2021 1314   CL 103 11/27/2021 1314   CO2 24 11/27/2021 1314   BUN 24 (H) 11/27/2021 1314   CREATININE 0.95 11/27/2021 1314   GLUCOSE 102 (H) 11/27/2021 1314   CALCIUM 9.0 11/27/2021 1314   AST 22 10/11/2020 1255   ALT 15 10/11/2020 1255   ALKPHOS 75 10/11/2020 1255   BILITOT 0.5 10/11/2020 1255   PROT 6.3 (L) 10/11/2020 1255   ALBUMIN 3.9 10/11/2020 1255    On phone during today's visit: patient only  Assessment and Plan-  Patient reports taking her prednisone, famotidine, and fexofenadine as instructed this morning, then taking her pomalidomide at 11 am So far is is feeling normal, no redness, rash, SOB, or itching.  Will continue as planned with pomalidomide and pre-medications tomorrow  Of note, patient did report some diarrhea this morning after her prednisone. Instructed her to take her prednisone closer to/with her breakfast. She did take some loperamide which stopped the diarrhea   Medication Access Issues: Pomalidomide refill will be sent in for next cycle based on how she responds to restarting the pomalidomide  Patient expressed understanding and was in agreement with this plan. She also understands that She can call clinic at any time with any questions, concerns, or complaints.   Follow-up plan: Telephone   Thank you for allowing me to participate in the care of this very pleasant patient.   Time Total:  10 mins of non face-to-face telephone visit time during this encounter  Visit consisted of counseling and education on dealing with issues of symptom management in the setting of serious and potentially life-threatening illness.Greater than 50%  of this time was spent counseling and coordinating care related to the above assessment and plan.   Darl Pikes, PharmD, BCPS, BCOP, CPP Hematology/Oncology Clinical Pharmacist Practitioner [place]/DB/AP [facility] [phone]  12/25/2021 1:49 PM

## 2021-12-25 NOTE — Telephone Encounter (Signed)
Refill too soon. Refill will be sent closer to start of next cycle

## 2021-12-26 ENCOUNTER — Inpatient Hospital Stay: Payer: Medicare PPO | Admitting: Pharmacist

## 2021-12-26 DIAGNOSIS — C9000 Multiple myeloma not having achieved remission: Secondary | ICD-10-CM

## 2021-12-26 NOTE — Progress Notes (Signed)
[facility] [place]  Telephone:([phone] Fax:([phone]    I connected with Ms. Goudeau on 12/26/21 at  1:00 PM EDT by telephone and verified that I am speaking with the correct person using two identifiers.  Name of the patient: Rhonda [name]  [id number]  22-Oct-1933   Location: Patient: at home Provider: in office   I discussed the limitations, risks, security and privacy concerns of performing an evaluation and management service by telephone and the availability of in person appointments. I also discussed with the patient that there may be a patient responsible charge related to this service. The patient expressed understanding and agreed to proceed.  HPI: Patient is a 88 y.o. female with multiple myeloma. Currently treated with pomalidomide and dexamethasone. Recently patient was off of her pomalidomide for 3 weeks and when she restarted, she has a reaction. She stopped her pomalidomide after 2 doses. Patient restarted her pomalidomide on 12/25/21, pre-medicating with prednisone, famotidine, and fexofenadine.   Reason for Consult: Oral chemotherapy follow-up for pomalidomide restart therapy.   PAST MEDICAL HISTORY: Past Medical History:  Diagnosis Date   Actinic keratosis    Arthritis    hands   Benign neoplasm of ascending colon    Cataract    GERD (gastroesophageal reflux disease)    Hyperlipemia    Hypertension 01/31/2015   Hypothyroidism    Melanoma ([place]) 04/28/2018   R mid to distal ant lat thigh. MM, SS, tumor thickness 0.70m, antatomic level II. Excised: 05/13/18, margins free   [place]   [place] ([facility] 04/18[place]   Osteoporosis    Shingles    Skin cancer    basal cell   Squamous cell carcinoma of skin 02/04/2014   Left pretibial. KA-like pattern   Squamous cell carcinoma of skin 09/15/2015   Right lat. superior ankle are. Superficial infiltration. Tx: EDC    Squamous cell carcinoma of skin 10/15/2016   Left medial mid lower leg. Tx: EDC   Squamous cell carcinoma of skin 11/27/2018   Right dorsum proximal forearm.   Squamous cell carcinoma of skin 09/09/2019   Right cheek lat. to mid nasolabial area. Tx: EDC   Squamous cell carcinoma of skin 12/09/2019   right pretibial below knee    HEMATOLOGY/ONCOLOGY HISTORY:  Oncology History  Multiple myeloma ([facility]  04/18/2016 Initial Diagnosis   Multiple myeloma (HCC)     ALLERGIES:  is allergic to revlimid [lenalidomide].  MEDICATIONS:  Current Outpatient Medications  Medication Sig Dispense Refill   aspirin EC 81 MG tablet Take 81 mg by mouth daily.     Cyanocobalamin (B-12 PO) Take 500 mcg by mouth daily.      EPINEPHrine 0.3 mg/0.3 mL IJ SOAJ injection      fexofenadine (ALLEGRA) 180 MG tablet Take 180 mg by mouth daily.     levothyroxine (SYNTHROID) 50 MCG tablet TAKE 1 TABLET BY MOUTH DAILY BEFORE BREAKFAST 90 tablet 3   Multiple Vitamins-Minerals (CENTRUM SILVER PO) Take 1 tablet by mouth daily.     pomalidomide (POMALYST) 2 MG capsule Take 1 capsule (2 mg total) by mouth daily. Take for 21 days, then hold for 7 days. Repeat every 28 days. 21 capsule 0   predniSONE (DELTASONE) 10 MG tablet Take 6 tablets (664m by mouth x1day, then 5 tablets (5031mx1 day, then 4 tablets (22m18m1day, then 3 tablets (30mg92mday, then 2 tablets (20mg)60may, then 1 tablets (10mg) 30my, then 1 tablets (10mg)14m  x1day. Take with breakfast 21 tablet 0   triamcinolone (NASACORT) 55 MCG/ACT AERO nasal inhaler Place 2 sprays into the nose 2 (two) times daily as needed. (Patient not taking: Reported on 12/05/2021)     UNABLE TO FIND Inject 1 Dose as directed once a week. Allergy injections once a week     No current facility-administered medications for this visit.    VITAL SIGNS: There were no vitals taken for this visit. There were no vitals filed for this visit.  Estimated body mass index is 23.67 kg/m as  calculated from the following:   Height as of 12/05/21: _0  (1.575 m).   Weight as of 12/05/21: 58.7 kg (129 lb 6.4 oz).  LABS: CBC:    Component Value Date/Time   WBC 4.6 11/27/2021 1314   HGB 13.4 11/27/2021 1314   HCT 39.9 11/27/2021 1314   PLT 239 11/27/2021 1314   MCV 95.2 11/27/2021 1314   NEUTROABS 1.7 11/27/2021 1314   LYMPHSABS 1.3 11/27/2021 1314   MONOABS 1.4 (H) 11/27/2021 1314   EOSABS 0.0 11/27/2021 1314   BASOSABS 0.2 (H) 11/27/2021 1314   Comprehensive Metabolic Panel:    Component Value Date/Time   NA 136 11/27/2021 1314   K 4.1 11/27/2021 1314   CL 103 11/27/2021 1314   CO2 24 11/27/2021 1314   BUN 24 (H) 11/27/2021 1314   CREATININE 0.95 11/27/2021 1314   GLUCOSE 102 (H) 11/27/2021 1314   CALCIUM 9.0 11/27/2021 1314   AST 22 10/11/2020 1255   ALT 15 10/11/2020 1255   ALKPHOS 75 10/11/2020 1255   BILITOT 0.5 10/11/2020 1255   PROT 6.3 (L) 10/11/2020 1255   ALBUMIN 3.9 10/11/2020 1255    On phone during today's visit: patient only  Assessment and Plan-  Patient reports taking her prednisone, famotidine, and fexofenadine as instructed again this morning, then taking her pomalidomide at 10 am So far is is feeling normal, no redness, rash, SOB, or itching.  Will continue as planned with pomalidomide and pre-medications tomorrow  Of note, the diarrhea patient experienced yesterday (likely from prednisone) did not return today   Medication Access Issues: Pomalidomide refill will be sent in for next cycle based on how she responds to restarting the pomalidomide  Patient expressed understanding and was in agreement with this plan. She also understands that She can call clinic at any time with any questions, concerns, or complaints.   Follow-up plan: Telephone call tomorrow  Thank you for allowing me to participate in the care of this very pleasant patient.   Time Total: 10 mins of non face-to-face telephone visit time during this encounter  Visit  consisted of counseling and education on dealing with issues of symptom management in the setting of serious and potentially life-threatening illness.Greater than 50%  of this time was spent counseling and coordinating care related to the above assessment and plan.   Darl Pikes, PharmD, BCPS, BCOP, CPP Hematology/Oncology Clinical Pharmacist Practitioner [place]/DB/AP [facility] [phone]  12/26/2021 1:17 PM

## 2021-12-27 ENCOUNTER — Inpatient Hospital Stay: Payer: Medicare PPO | Admitting: Pharmacist

## 2021-12-27 DIAGNOSIS — C9000 Multiple myeloma not having achieved remission: Secondary | ICD-10-CM

## 2021-12-27 NOTE — Progress Notes (Signed)
Rhonda [name]  Telephone:([phone] [phone]    I connected with Rhonda [name] on 12/27/21 at  1:00 PM EDT by telephone and verified that I am speaking with the correct person using two identifiers.  Name of the patient: Rhonda [name]  [id number]  November 21, 1933   Location: Patient: at home Provider: in office   I discussed the limitations, risks, security and privacy concerns of performing an evaluation and management service by telephone and the availability of in person appointments. I also discussed with the patient that there may be a patient responsible charge related to this service. The patient expressed understanding and agreed to proceed.  HPI: Patient is a 88 y.o. female with multiple myeloma. Currently treated with pomalidomide and dexamethasone. Recently patient was off of her pomalidomide for 3 weeks and when she restarted, she has a reaction. She stopped her pomalidomide after 2 doses. Patient restarted her pomalidomide on 12/25/21, pre-medicating with prednisone, famotidine, and fexofenadine.   Reason for Consult: Rhonda chemotherapy follow-up for pomalidomide restart therapy.   PAST MEDICAL HISTORY: Past Medical History:  Diagnosis Date   Actinic keratosis    Arthritis    hands   Benign neoplasm of ascending colon    Cataract    GERD (gastroesophageal reflux disease)    Hyperlipemia    Hypertension 01/31/2015   Hypothyroidism    Melanoma ([place]) 04/28/2018   R mid to distal ant lat thigh. MM, SS, tumor thickness 0.14m, antatomic level II. Excised: 05/13/18, margins free   [place] [place]18[place]   [place] 04/18[place]2016   Osteoporosis    Shingles    Skin cancer    basal cell   Squamous cell carcinoma of skin 02/04/2014   Left pretibial. KA-like pattern   Squamous cell carcinoma of skin 09/15/2015   Right lat. superior ankle are. Superficial infiltration. Tx: EDC    Squamous cell carcinoma of skin 10/15/2016   Left medial mid lower leg. Tx: EDC   Squamous cell carcinoma of skin 11/27/2018   Right dorsum proximal forearm.   Squamous cell carcinoma of skin 09/09/2019   Right cheek lat. to mid nasolabial area. Tx: EDC   Squamous cell carcinoma of skin 12/09/2019   right pretibial below knee    HEMATOLOGY/ONCOLOGY HISTORY:  Oncology History  [place] ([facility]  04/18/2016 Initial Diagnosis   Multiple myeloma (HCC)     ALLERGIES:  is allergic to revlimid [lenalidomide].  MEDICATIONS:  Current Outpatient Medications  Medication Sig Dispense Refill   aspirin EC 81 MG tablet Take 81 mg by mouth daily.     Cyanocobalamin (B-12 PO) Take 500 mcg by mouth daily.      EPINEPHrine 0.3 mg/0.3 mL IJ SOAJ injection      fexofenadine (ALLEGRA) 180 MG tablet Take 180 mg by mouth daily.     levothyroxine (SYNTHROID) 50 MCG tablet TAKE 1 TABLET BY MOUTH DAILY BEFORE BREAKFAST 90 tablet 3   Multiple Vitamins-Minerals (CENTRUM SILVER PO) Take 1 tablet by mouth daily.     pomalidomide (POMALYST) 2 MG capsule Take 1 capsule (2 mg total) by mouth daily. Take for 21 days, then hold for 7 days. Repeat every 28 days. 21 capsule 0   predniSONE (DELTASONE) 10 MG tablet Take 6 tablets (68m by mouth x1day, then 5 tablets (506mx1 day, then 4 tablets (8m20m1day, then 3 tablets (30mg19mday, then 2 tablets (20mg)38may, then 1 tablets (10mg) 31my, then 1 tablets (10mg)110m  x1day. Take with breakfast 21 tablet 0   triamcinolone (NASACORT) 55 MCG/ACT AERO nasal inhaler Place 2 sprays into the nose 2 (two) times daily as needed. (Patient not taking: Reported on 12/05/2021)     UNABLE TO FIND Inject 1 Dose as directed once a week. Allergy injections once a week     No current facility-administered medications for this visit.    VITAL SIGNS: There were no vitals taken for this visit. There were no vitals filed for this visit.  Estimated body mass index is 23.67 kg/m as  calculated from the following:   Height as of 12/05/21: '5\' 2"'  (1.575 m).   Weight as of 12/05/21: 58.7 kg (129 lb 6.4 oz).  LABS: CBC:    Component Value Date/Time   WBC 4.6 11/27/2021 1314   HGB 13.4 11/27/2021 1314   HCT 39.9 11/27/2021 1314   PLT 239 11/27/2021 1314   MCV 95.2 11/27/2021 1314   NEUTROABS 1.7 11/27/2021 1314   LYMPHSABS 1.3 11/27/2021 1314   MONOABS 1.4 (H) 11/27/2021 1314   EOSABS 0.0 11/27/2021 1314   BASOSABS 0.2 (H) 11/27/2021 1314   Comprehensive Metabolic Panel:    Component Value Date/Time   NA 136 11/27/2021 1314   K 4.1 11/27/2021 1314   CL 103 11/27/2021 1314   CO2 24 11/27/2021 1314   BUN 24 (H) 11/27/2021 1314   CREATININE 0.95 11/27/2021 1314   GLUCOSE 102 (H) 11/27/2021 1314   CALCIUM 9.0 11/27/2021 1314   AST 22 10/11/2020 1255   ALT 15 10/11/2020 1255   ALKPHOS 75 10/11/2020 1255   BILITOT 0.5 10/11/2020 1255   PROT 6.3 (L) 10/11/2020 1255   ALBUMIN 3.9 10/11/2020 1255    On phone during today's visit: patient only  Assessment and Plan-  Patient reports taking her prednisone, famotidine, and fexofenadine as instructed again this morning, then taking her pomalidomide So far she is feeling normal, no redness, rash, SOB, or itching.  Will continue as planned with pomalidomide and pre-medications tomorrow   Medication Access Issues: Pomalidomide refill will be sent in for next cycle based on how she responds to restarting the pomalidomide  Patient expressed understanding and was in agreement with this plan. She also understands that She can call clinic at any time with any questions, concerns, or complaints.   Follow-up plan: Telephone call tomorrow  Thank you for allowing me to participate in the care of this very pleasant patient.   Time Total: 5 mins of non face-to-face telephone visit time during this encounter  Visit consisted of counseling and education on dealing with issues of symptom management in the setting of serious and  potentially life-threatening illness.Greater than 50%  of this time was spent counseling and coordinating care related to the above assessment and plan.   Darl Pikes, PharmD, BCPS, BCOP, CPP Hematology/Oncology Clinical Pharmacist Practitioner [place]/DB/AP [facility] [phone]  12/27/2021 1:09 PM

## 2021-12-28 ENCOUNTER — Inpatient Hospital Stay: Payer: Medicare PPO | Admitting: Pharmacist

## 2021-12-28 DIAGNOSIS — C9000 Multiple myeloma not having achieved remission: Secondary | ICD-10-CM

## 2021-12-28 NOTE — Progress Notes (Signed)
[facility]  Telephone:([phone] [phone]    I connected with Rhonda [name] on 12/28/21 at  1:00 PM EDT by telephone and verified that I am speaking with the correct person using two identifiers.  Name of the patient: Rhonda [name]  [id number]  [birth year]   Location: Patient: at home Provider: in office   I discussed the limitations, risks, security and privacy concerns of performing an evaluation and management service by telephone and the availability of in person appointments. I also discussed with the patient that there may be a patient responsible charge related to this service. The patient expressed understanding and agreed to proceed.  HPI: Patient is a 88 y.o. female with multiple myeloma. Currently treated with pomalidomide and dexamethasone. Recently patient was off of her pomalidomide for 3 weeks and when she restarted, she has a reaction. She stopped her pomalidomide after 2 doses. Patient restarted her pomalidomide on 12/25/21, pre-medicating with prednisone, famotidine, and fexofenadine.   Reason for Consult: Oral chemotherapy follow-up for pomalidomide restart therapy.   PAST MEDICAL HISTORY: Past Medical History:  Diagnosis Date   Actinic keratosis    Arthritis    hands   Benign neoplasm of ascending colon    Cataract    GERD (gastroesophageal reflux disease)    Hyperlipemia    Hypertension 01/31/2015   Hypothyroidism    Melanoma ([place]) 04/28/2018   R mid to distal ant lat thigh. MM, SS, tumor thickness 0.55m, antatomic level II. Excised: 05/13/18, margins free   [place] ([facility] 04/18/2016   Multiple [place]oma in remission ([facility] 04/18/2016   Osteoporosis    Shingles    Skin cancer    basal cell   Squamous cell carcinoma of skin 02/04/2014   Left pretibial. KA-like pattern   Squamous cell carcinoma of skin 09/15/2015   Right lat. superior ankle are. Superficial infiltration. Tx: EDC    Squamous cell carcinoma of skin 10/15/2016   Left medial mid lower leg. Tx: EDC   Squamous cell carcinoma of skin 11/27/2018   Right dorsum proximal forearm.   Squamous cell carcinoma of skin 09/09/2019   Right cheek lat. to mid nasolabial area. Tx: EDC   Squamous cell carcinoma of skin 12/09/2019   right pretibial below knee    HEMATOLOGY/ONCOLOGY HISTORY:  Oncology History  Multiple myeloma ([facility]  04/18/2016 Initial Diagnosis   Multiple myeloma (HCC)     ALLERGIES:  is allergic to revlimid [lenalidomide].  MEDICATIONS:  Current Outpatient Medications  Medication Sig Dispense Refill   aspirin EC 81 MG tablet Take 81 mg by mouth daily.     Cyanocobalamin (B-12 PO) Take 500 mcg by mouth daily.      EPINEPHrine 0.3 mg/0.3 mL IJ SOAJ injection      fexofenadine (ALLEGRA) 180 MG tablet Take 180 mg by mouth daily.     levothyroxine (SYNTHROID) 50 MCG tablet TAKE 1 TABLET BY MOUTH DAILY BEFORE BREAKFAST 90 tablet 3   Multiple Vitamins-Minerals (CENTRUM SILVER PO) Take 1 tablet by mouth daily.     pomalidomide (POMALYST) 2 MG capsule Take 1 capsule (2 mg total) by mouth daily. Take for 21 days, then hold for 7 days. Repeat every 28 days. 21 capsule 0   predniSONE (DELTASONE) 10 MG tablet Take 6 tablets (626m by mouth x1day, then 5 tablets (5078mx1 day, then 4 tablets (16m73m1day, then 3 tablets (30mg77mday, then 2 tablets (20mg)24may, then 1 tablets (10mg) 44my, then 1 tablets (10mg)78m  x1day. Take with breakfast 21 tablet 0   triamcinolone (NASACORT) 55 MCG/ACT AERO nasal inhaler Place 2 sprays into the nose 2 (two) times daily as needed. (Patient not taking: Reported on 12/05/2021)     UNABLE TO FIND Inject 1 Dose as directed once a week. Allergy injections once a week     No current facility-administered medications for this visit.    VITAL SIGNS: There were no vitals taken for this visit. There were no vitals filed for this visit.  Estimated body mass index is 23.67 kg/m as  calculated from the following:   Height as of 12/05/21: 5' 2" (1.575 m).   Weight as of 12/05/21: 58.7 kg (129 lb 6.4 oz).  LABS: CBC:    Component Value Date/Time   WBC 4.6 11/27/2021 1314   HGB 13.4 11/27/2021 1314   HCT 39.9 11/27/2021 1314   PLT 239 11/27/2021 1314   MCV 95.2 11/27/2021 1314   NEUTROABS 1.7 11/27/2021 1314   LYMPHSABS 1.3 11/27/2021 1314   MONOABS 1.4 (H) 11/27/2021 1314   EOSABS 0.0 11/27/2021 1314   BASOSABS 0.2 (H) 11/27/2021 1314   Comprehensive Metabolic Panel:    Component Value Date/Time   NA 136 11/27/2021 1314   K 4.1 11/27/2021 1314   CL 103 11/27/2021 1314   CO2 24 11/27/2021 1314   BUN 24 (H) 11/27/2021 1314   CREATININE 0.95 11/27/2021 1314   GLUCOSE 102 (H) 11/27/2021 1314   CALCIUM 9.0 11/27/2021 1314   AST 22 10/11/2020 1255   ALT 15 10/11/2020 1255   ALKPHOS 75 10/11/2020 1255   BILITOT 0.5 10/11/2020 1255   PROT 6.3 (L) 10/11/2020 1255   ALBUMIN 3.9 10/11/2020 1255    On phone during today's visit: patient only  Assessment and Plan-  Patient reports taking her prednisone, famotidine, and fexofenadine as instructed again this morning, then taking her pomalidomide She continues to feel normal; no redness, rash, SOB, or itching.  Will continue as planned with pomalidomide and pre-medications tomorrow   Medication Access Issues: Pomalidomide refill will be sent in for next cycle based on how she responds to restarting the pomalidomide  Patient expressed understanding and was in agreement with this plan. She also understands that She can call clinic at any time with any questions, concerns, or complaints.   Follow-up plan: Telephone call tomorrow  Thank you for allowing me to participate in the care of this very pleasant patient.   Time Total: 5 mins of non face-to-face telephone visit time during this encounter  Visit consisted of counseling and education on dealing with issues of symptom management in the setting of serious and  potentially life-threatening illness.Greater than 50%  of this time was spent counseling and coordinating care related to the above assessment and plan.   Darl Pikes, PharmD, BCPS, BCOP, CPP Hematology/Oncology Clinical Pharmacist Practitioner [place]/DB/AP [facility] [phone]  12/28/2021 2:10 PM

## 2021-12-29 ENCOUNTER — Inpatient Hospital Stay: Payer: Medicare PPO | Admitting: Pharmacist

## 2021-12-29 DIAGNOSIS — C9000 Multiple myeloma not having achieved remission: Secondary | ICD-10-CM

## 2021-12-30 ENCOUNTER — Encounter: Payer: Self-pay | Admitting: Dermatology

## 2022-01-01 ENCOUNTER — Other Ambulatory Visit: Payer: Self-pay | Admitting: *Deleted

## 2022-01-01 ENCOUNTER — Ambulatory Visit: Payer: Medicare PPO | Admitting: Pharmacist

## 2022-01-01 DIAGNOSIS — C9000 Multiple myeloma not having achieved remission: Secondary | ICD-10-CM

## 2022-01-01 NOTE — Telephone Encounter (Signed)
Refill too soon. Refill does not need to be sent until 01/15/22

## 2022-01-01 NOTE — Progress Notes (Signed)
[facility] [place]  Telephone:([phone] Fax:([phone]    I connected with Ms. Peery on 01/01/22 at  1:00 PM EDT by telephone and verified that I am speaking with the correct person using two identifiers.  Name of the patient: Rhonda [name]  [id number]  1933-07-24   Location: Patient: at home Provider: home office   I discussed the limitations, risks, security and privacy concerns of performing an evaluation and management service by telephone and the availability of in person appointments. I also discussed with the patient that there may be a patient responsible charge related to this service. The patient expressed understanding and agreed to proceed.  HPI: Patient is a 88 y.o. female with multiple myeloma. Currently treated with pomalidomide and dexamethasone. Recently patient was off of her pomalidomide for 3 weeks and when she restarted, she has a reaction. She stopped her pomalidomide after 2 doses. Patient restarted her pomalidomide on 12/25/21, pre-medicating with prednisone, famotidine, and fexofenadine.   Reason for Consult: Oral chemotherapy follow-up for pomalidomide restart therapy.   PAST MEDICAL HISTORY: Past Medical History:  Diagnosis Date   Actinic keratosis    Arthritis    hands   Benign neoplasm of ascending colon    Cataract    GERD (gastroesophageal reflux disease)    Hyperlipemia    Hypertension 01/31/2015   Hypothyroidism    Melanoma ([place]) 04/28/2018   R mid to distal ant lat thigh. MM, SS, tumor thickness 0.31m, antatomic level II. Excised: 05/13/18, margins free   [place] [facility] 04/18[place]   Multiple myeloma in remission ([facility] 04/18/2016   Osteoporosis    Shingles    Skin cancer    basal cell   Squamous cell carcinoma of skin 02/04/2014   Left pretibial. KA-like pattern   Squamous cell carcinoma of skin 09/15/2015   Right lat. superior ankle are. Superficial infiltration. Tx: EDC    Squamous cell carcinoma of skin 10/15/2016   Left medial mid lower leg. Tx: EDC   Squamous cell carcinoma of skin 11/27/2018   Right dorsum proximal forearm.   Squamous cell carcinoma of skin 09/09/2019   Right cheek lat. to mid nasolabial area. Tx: EDC   Squamous cell carcinoma of skin 12/09/2019   right pretibial below knee    HEMATOLOGY/ONCOLOGY HISTORY:  Oncology History  Multiple myeloma ([facility]  04/18/2016 Initial Diagnosis   Multiple myeloma ([facility])     ALLERGIES:  is allergic to revlimid [lenalidomide].  MEDICATIONS:  Current Outpatient Medications  Medication Sig Dispense Refill   aspirin EC 81 MG tablet Take 81 mg by mouth daily.     Cyanocobalamin (B-12 PO) Take 500 mcg by mouth daily.      EPINEPHrine 0.3 mg/0.3 mL IJ SOAJ injection      fexofenadine (ALLEGRA) 180 MG tablet Take 180 mg by mouth daily.     levothyroxine (SYNTHROID) 50 MCG tablet TAKE 1 TABLET BY MOUTH DAILY BEFORE BREAKFAST 90 tablet 3   Multiple Vitamins-Minerals (CENTRUM SILVER PO) Take 1 tablet by mouth daily.     pomalidomide (POMALYST) 2 MG capsule Take 1 capsule (2 mg total) by mouth daily. Take for 21 days, then hold for 7 days. Repeat every 28 days. 21 capsule 0   predniSONE (DELTASONE) 10 MG tablet Take 6 tablets (649m by mouth x1day, then 5 tablets (5015mx1 day, then 4 tablets (59m60m1day, then 3 tablets (30mg93mday, then 2 tablets (20mg)53may, then 1 tablets (10mg) 49my, then 1 tablets (10mg)34m  x1day. Take with breakfast 21 tablet 0   triamcinolone (NASACORT) 55 MCG/ACT AERO nasal inhaler Place 2 sprays into the nose 2 (two) times daily as needed. (Patient not taking: Reported on 12/05/2021)     UNABLE TO FIND Inject 1 Dose as directed once a week. Allergy injections once a week     No current facility-administered medications for this visit.    VITAL SIGNS: There were no vitals taken for this visit. There were no vitals filed for this visit.  Estimated body mass index is 23.67 kg/m as  calculated from the following:   Height as of 12/05/21: '5\' 2"'  (1.575 m).   Weight as of 12/05/21: 58.7 kg (129 lb 6.4 oz).  LABS: CBC:    Component Value Date/Time   WBC 4.6 11/27/2021 1314   HGB 13.4 11/27/2021 1314   HCT 39.9 11/27/2021 1314   PLT 239 11/27/2021 1314   MCV 95.2 11/27/2021 1314   NEUTROABS 1.7 11/27/2021 1314   LYMPHSABS 1.3 11/27/2021 1314   MONOABS 1.4 (H) 11/27/2021 1314   EOSABS 0.0 11/27/2021 1314   BASOSABS 0.2 (H) 11/27/2021 1314   Comprehensive Metabolic Panel:    Component Value Date/Time   NA 136 11/27/2021 1314   K 4.1 11/27/2021 1314   CL 103 11/27/2021 1314   CO2 24 11/27/2021 1314   BUN 24 (H) 11/27/2021 1314   CREATININE 0.95 11/27/2021 1314   GLUCOSE 102 (H) 11/27/2021 1314   CALCIUM 9.0 11/27/2021 1314   AST 22 10/11/2020 1255   ALT 15 10/11/2020 1255   ALKPHOS 75 10/11/2020 1255   BILITOT 0.5 10/11/2020 1255   PROT 6.3 (L) 10/11/2020 1255   ALBUMIN 3.9 10/11/2020 1255    On phone during today's visit: patient only  Assessment and Plan-  Patient reports taking her prednisone, famotidine, and fexofenadine as instructed again this morning, then taking her pomalidomide Her last dose of prednisone will be on 12/31/21 She will continue to premedicate her pomalidomide with famotidine and fexofenadine  She continues to feel normal; no redness, rash, SOB, or itching.    Medication Access Issues: Pomalidomide refill will be sent in for next cycle closer to 8/7  Patient expressed understanding and was in agreement with this plan. She also understands that She can call clinic at any time with any questions, concerns, or complaints.   Follow-up plan: Patient will call the office if she has any reaction to taking her pomalidomide prior to her next office visit  Thank you for allowing me to participate in the care of this very pleasant patient.   Time Total: 5 mins of non face-to-face telephone visit time during this encounter  Visit consisted  of counseling and education on dealing with issues of symptom management in the setting of serious and potentially life-threatening illness.Greater than 50%  of this time was spent counseling and coordinating care related to the above assessment and plan.   Darl Pikes, PharmD, BCPS, BCOP, CPP Hematology/Oncology Clinical Pharmacist Practitioner [place]/DB/AP [facility] [phone]  01/01/2022 9:56 AM

## 2022-01-02 ENCOUNTER — Telehealth: Payer: Self-pay

## 2022-01-02 NOTE — Telephone Encounter (Signed)
[facility] calling to follow up on pomalyst request callback # is [phone]

## 2022-01-05 DIAGNOSIS — J301 Allergic rhinitis due to pollen: Secondary | ICD-10-CM | POA: Diagnosis not present

## 2022-01-08 DIAGNOSIS — J301 Allergic rhinitis due to pollen: Secondary | ICD-10-CM | POA: Diagnosis not present

## 2022-01-10 ENCOUNTER — Other Ambulatory Visit: Payer: Self-pay | Admitting: Pharmacist

## 2022-01-10 DIAGNOSIS — C9000 Multiple myeloma not having achieved remission: Secondary | ICD-10-CM

## 2022-01-10 MED ORDER — POMALIDOMIDE 2 MG PO CAPS: 2.0000 mg | ORAL_CAPSULE | Freq: Every day | ORAL | 0 refills | Status: DC

## 2022-01-12 DIAGNOSIS — J301 Allergic rhinitis due to pollen: Secondary | ICD-10-CM | POA: Diagnosis not present

## 2022-01-15 ENCOUNTER — Ambulatory Visit: Payer: Medicare PPO | Admitting: Nurse Practitioner

## 2022-01-15 ENCOUNTER — Encounter: Payer: Self-pay | Admitting: Nurse Practitioner

## 2022-01-15 VITALS — BP 110/87 | HR 78 | Temp 97.7°F | Resp 16 | Ht 62.0 in | Wt 130.8 lb

## 2022-01-15 DIAGNOSIS — B3731 Acute candidiasis of vulva and vagina: Secondary | ICD-10-CM

## 2022-01-15 DIAGNOSIS — R3129 Other microscopic hematuria: Secondary | ICD-10-CM | POA: Diagnosis not present

## 2022-01-15 DIAGNOSIS — N3001 Acute cystitis with hematuria: Secondary | ICD-10-CM | POA: Diagnosis not present

## 2022-01-15 DIAGNOSIS — R300 Dysuria: Secondary | ICD-10-CM

## 2022-01-15 DIAGNOSIS — C9000 Multiple myeloma not having achieved remission: Secondary | ICD-10-CM

## 2022-01-15 DIAGNOSIS — Z5181 Encounter for therapeutic drug level monitoring: Secondary | ICD-10-CM | POA: Diagnosis not present

## 2022-01-15 LAB — POCT URINALYSIS DIPSTICK
Glucose, UA: NEGATIVE
Leukocytes, UA: NEGATIVE
Nitrite, UA: NEGATIVE
Protein, UA: POSITIVE — AB
Spec Grav, UA: 1.015 (ref 1.010–1.025)
Urobilinogen, UA: 0.2 E.U./dL
pH, UA: 5 (ref 5.0–8.0)

## 2022-01-15 MED ORDER — FLUCONAZOLE 150 MG PO TABS: 150.0000 mg | ORAL_TABLET | Freq: Once | ORAL | 0 refills | Status: AC

## 2022-01-15 MED ORDER — NITROFURANTOIN MONOHYD MACRO 100 MG PO CAPS: 100.0000 mg | ORAL_CAPSULE | Freq: Two times a day (BID) | ORAL | 0 refills | Status: DC

## 2022-01-15 NOTE — Progress Notes (Signed)
[facility] [place], [place] [address]  Internal MEDICINE  Office Visit Note  Patient Name: Rhonda [name]  [id number]  [id number]  Date of Service: 01/15/2022  Chief Complaint  Patient presents with   Urinary Tract Infection    Strong odor coming from both feces and urine - some pain with urination, itching, lower back pain and chills     HPI Rhonda [name] presents for an acute sick visit for symptoms of UTI -urine odor, vaginal itching, lower back pain, and chills. Concerned about kidneys, she is on a chemo medication that is nephrotoxic called pomalyst -strong odor of stool, abdominal cramping, has been either diarrhea or constipated, urge to go, abdominal distention.     Current Medication:  Outpatient Encounter Medications as of 01/15/2022  Medication Sig   aspirin EC 81 MG tablet Take 81 mg by mouth daily.   Cyanocobalamin (B-12 PO) Take 500 mcg by mouth daily.    EPINEPHrine 0.3 mg/0.3 mL IJ SOAJ injection    fexofenadine (ALLEGRA) 180 MG tablet Take 180 mg by mouth daily.   fluconazole (DIFLUCAN) 150 MG tablet Take 1 tablet (150 mg total) by mouth once for 1 dose. May take an additional dose after 3 days if still symptomatic.   levothyroxine (SYNTHROID) 50 MCG tablet TAKE 1 TABLET BY MOUTH DAILY BEFORE BREAKFAST   Multiple Vitamins-Minerals (CENTRUM SILVER PO) Take 1 tablet by mouth daily.   nitrofurantoin, macrocrystal-monohydrate, (MACROBID) 100 MG capsule Take 1 capsule (100 mg total) by mouth 2 (two) times daily. Take with food.   pomalidomide (POMALYST) 2 MG capsule Take 1 capsule (2 mg total) by mouth daily. Take for 21 days, then hold for 7 days. Repeat every 28 days.   triamcinolone (NASACORT) 55 MCG/ACT AERO nasal inhaler Place 2 sprays into the nose 2 (two) times daily as needed.   UNABLE TO FIND Inject 1 Dose as directed once a week. Allergy injections once a week   No facility-administered encounter medications on file as of 01/15/2022.       Medical History: Past Medical History:  Diagnosis Date   Actinic keratosis    Arthritis    hands   Benign neoplasm of ascending colon    Cataract    GERD (gastroesophageal reflux disease)    Hyperlipemia    Hypertension 01/31/2015   Hypothyroidism    Melanoma ([place]) 04/28/2018   R mid to distal ant lat thigh. MM, SS, tumor thickness 0.57m, antatomic level II. Excised: 05/13/18, margins free   Multiple myeloma ([facility] 04/18/2016   Multiple [facility]oma in remission ([facility] 04/18/2016   Osteoporosis    Shingles    Skin cancer    basal cell   Squamous cell carcinoma of skin 02/04/2014   Left pretibial. KA-like pattern   Squamous cell carcinoma of skin 09/15/2015   Right lat. superior ankle are. Superficial infiltration. Tx: EDC   Squamous cell carcinoma of skin 10/15/2016   Left medial mid lower leg. Tx: EDC   Squamous cell carcinoma of skin 11/27/2018   Right dorsum proximal forearm.   Squamous cell carcinoma of skin 09/09/2019   Right cheek lat. to mid nasolabial area. Tx: EDC   Squamous cell carcinoma of skin 12/09/2019   right pretibial below knee     Vital Signs: BP 110/87   Pulse 78   Temp 97.7 F (36.5 C)   Resp 16   Ht '5\' 2"'  (1.575 m)   Wt 130 lb 12.8 oz (59.3 kg)   SpO2 95%  BMI 23.92 kg/m    Review of Systems  Constitutional:  Negative for chills, fatigue and fever.  HENT: Negative.    Respiratory: Negative.  Negative for cough, chest tightness, shortness of breath and wheezing.   Cardiovascular: Negative.  Negative for chest pain and palpitations.  Gastrointestinal:  Positive for abdominal pain, constipation and diarrhea. Negative for nausea and vomiting.  Genitourinary:  Positive for difficulty urinating, dysuria, frequency and urgency.       Urine odor  Musculoskeletal: Negative.     Physical Exam Vitals reviewed.  Constitutional:      General: She is not in acute distress.    Appearance: Normal appearance. She is normal weight. She is not  ill-appearing.  HENT:     Head: Normocephalic and atraumatic.  Eyes:     Pupils: Pupils are equal, round, and reactive to light.  Cardiovascular:     Rate and Rhythm: Normal rate and regular rhythm.  Pulmonary:     Effort: Pulmonary effort is normal. No respiratory distress.  Neurological:     Mental Status: She is alert and oriented to person, place, and time.  Psychiatric:        Mood and Affect: Mood normal.        Behavior: Behavior normal.       Assessment/Plan: 1. Acute cystitis with hematuria Empiric antibiotic treatment prescribed, urine culture sent, lab ordered to check kidney function - CMP14+EGFR - nitrofurantoin, macrocrystal-monohydrate, (MACROBID) 100 MG capsule; Take 1 capsule (100 mg total) by mouth 2 (two) times daily. Take with food.  Dispense: 14 capsule; Refill: 0  2. Vulvovaginal candidiasis Fluconazole ordered to treat possible yeast infection - fluconazole (DIFLUCAN) 150 MG tablet; Take 1 tablet (150 mg total) by mouth once for 1 dose. May take an additional dose after 3 days if still symptomatic.  Dispense: 3 tablet; Refill: 0  3. Multiple myeloma not having achieved remission ([place]) Currently being treated with pomalyst which is nephrotoxic and also Has side effects of diarrhea, constipation and abdominal pain,and may also increase risk of infection such as UTI by 17 %  4. Dysuria Urinalysis is positive for large amount of blood, negative leukocytes and nitrites, urine culture sent.  - POCT Urinalysis Dipstick - CULTURE, URINE COMPREHENSIVE  5. Therapeutic drug monitoring CMP ordered to check kidney function since she is currently taking pomalyst which is nephrotoxic - CMP14+EGFR   General Counseling: Rhonda [name] verbalizes understanding of the findings of todays visit and agrees with plan of treatment. I have discussed any further diagnostic evaluation that may be needed or ordered today. We also reviewed her medications today. she has been encouraged to  call the office with any questions or concerns that should arise related to todays visit.    Counseling:    Orders Placed This Encounter  Procedures   CULTURE, URINE COMPREHENSIVE   CMP14+EGFR   POCT Urinalysis Dipstick    Meds ordered this encounter  Medications   nitrofurantoin, macrocrystal-monohydrate, (MACROBID) 100 MG capsule    Sig: Take 1 capsule (100 mg total) by mouth 2 (two) times daily. Take with food.    Dispense:  14 capsule    Refill:  0   fluconazole (DIFLUCAN) 150 MG tablet    Sig: Take 1 tablet (150 mg total) by mouth once for 1 dose. May take an additional dose after 3 days if still symptomatic.    Dispense:  3 tablet    Refill:  0    Return if symptoms worsen or fail to improve.  [place] Controlled Substance Database was reviewed by me for overdose risk score (ORS)  Time spent:30 Minutes Time spent with patient included reviewing progress notes, labs, imaging studies, and discussing plan for follow up.   This patient was seen by Jonetta Osgood, FNP-C in collaboration with Dr. Clayborn Bigness as a part of collaborative care agreement.  Jatziri Goffredo R. Valetta Fuller, MSN, FNP-C Internal Medicine

## 2022-01-15 NOTE — Patient Instructions (Signed)
May take OTC immodium as directed on the packaging but be aware that this may cause/worsen constipation.   May add a fiber supplement such as metamucil or generic equivalent.

## 2022-01-19 DIAGNOSIS — J301 Allergic rhinitis due to pollen: Secondary | ICD-10-CM | POA: Diagnosis not present

## 2022-01-19 LAB — CULTURE, URINE COMPREHENSIVE

## 2022-02-01 ENCOUNTER — Other Ambulatory Visit: Payer: Self-pay | Admitting: Oncology

## 2022-02-01 DIAGNOSIS — C9000 Multiple myeloma not having achieved remission: Secondary | ICD-10-CM

## 2022-02-02 DIAGNOSIS — J301 Allergic rhinitis due to pollen: Secondary | ICD-10-CM | POA: Diagnosis not present

## 2022-02-09 DIAGNOSIS — J301 Allergic rhinitis due to pollen: Secondary | ICD-10-CM | POA: Diagnosis not present

## 2022-02-14 ENCOUNTER — Telehealth: Payer: Self-pay

## 2022-02-14 NOTE — Telephone Encounter (Signed)
Left a voicemail for patient regarding her still having UTI/yeast infection symptoms. Advised to please call back if symptoms have not resolved per Alyssa.

## 2022-02-14 NOTE — Telephone Encounter (Signed)
-----   Message from Jonetta Osgood, NP sent at 02/12/2022 10:35 PM EDT ----- Regarding: pls check on patient Please follow up with the patient and see if her symptoms have resolved. She was having symptoms of a possible UTI or yeast infection.  Let me know if not resolved.  ----- Message ----- From: Toma Deiters, CMA Sent: 01/15/2022  10:26 AM EDT To: Jonetta Osgood, NP

## 2022-02-16 DIAGNOSIS — J301 Allergic rhinitis due to pollen: Secondary | ICD-10-CM | POA: Diagnosis not present

## 2022-02-25 ENCOUNTER — Other Ambulatory Visit: Payer: Self-pay | Admitting: Oncology

## 2022-02-25 DIAGNOSIS — C9000 Multiple myeloma not having achieved remission: Secondary | ICD-10-CM

## 2022-02-26 ENCOUNTER — Encounter: Payer: Self-pay | Admitting: Nurse Practitioner

## 2022-02-27 ENCOUNTER — Inpatient Hospital Stay: Payer: Medicare PPO | Attending: Oncology

## 2022-02-27 DIAGNOSIS — C9002 Multiple myeloma in relapse: Secondary | ICD-10-CM | POA: Diagnosis not present

## 2022-02-27 DIAGNOSIS — C9000 Multiple myeloma not having achieved remission: Secondary | ICD-10-CM

## 2022-02-27 LAB — BASIC METABOLIC PANEL
Anion gap: 6 (ref 5–15)
BUN: 21 mg/dL (ref 8–23)
CO2: 25 mmol/L (ref 22–32)
Calcium: 8.9 mg/dL (ref 8.9–10.3)
Chloride: 107 mmol/L (ref 98–111)
Creatinine, Ser: 0.81 mg/dL (ref 0.44–1.00)
GFR, Estimated: >60 mL/min (ref >60)
Glucose, Bld: 93 mg/dL (ref 70–99)
Potassium: 4.4 mmol/L (ref 3.5–5.1)
Sodium: 138 mmol/L (ref 135–145)

## 2022-02-27 LAB — CBC WITH DIFFERENTIAL/PLATELET
Abs Immature Granulocytes: 0.03 10*3/uL (ref 0.00–0.07)
Basophils Absolute: 0.1 10*3/uL (ref 0.0–0.1)
Basophils Relative: 2 %
Eosinophils Absolute: 0.1 10*3/uL (ref 0.0–0.5)
Eosinophils Relative: 1 %
HCT: 39.2 % (ref 36.0–46.0)
Hemoglobin: 13.5 g/dL (ref 12.0–15.0)
Immature Granulocytes: 1 %
Lymphocytes Relative: 26 %
Lymphs Abs: 1.1 10*3/uL (ref 0.7–4.0)
MCH: 33 pg (ref 26.0–34.0)
MCHC: 34.4 g/dL (ref 30.0–36.0)
MCV: 95.8 fL (ref 80.0–100.0)
Monocytes Absolute: 0.4 10*3/uL (ref 0.1–1.0)
Monocytes Relative: 10 %
Neutro Abs: 2.6 10*3/uL (ref 1.7–7.7)
Neutrophils Relative %: 60 %
Platelets: 214 10*3/uL (ref 150–400)
RBC: 4.09 MIL/uL (ref 3.87–5.11)
RDW: 13.1 % (ref 11.5–15.5)
WBC: 4.3 10*3/uL (ref 4.0–10.5)
nRBC: 0 % (ref 0.0–0.2)

## 2022-02-27 NOTE — Telephone Encounter (Signed)
Gwynne Edinger, is this ok to fill?

## 2022-02-28 LAB — KAPPA/LAMBDA LIGHT CHAINS
Kappa free light chain: 28.1 mg/L — ABNORMAL HIGH (ref 3.3–19.4)
Kappa, lambda light chain ratio: 0.06 — ABNORMAL LOW (ref 0.26–1.65)
Lambda free light chains: 480.7 mg/L — ABNORMAL HIGH (ref 5.7–26.3)

## 2022-02-28 LAB — IGG, IGA, IGM
IgA: 70 mg/dL (ref 64–422)
IgG (Immunoglobin G), Serum: 626 mg/dL (ref 586–1602)
IgM (Immunoglobulin M), Srm: 24 mg/dL — ABNORMAL LOW (ref 26–217)

## 2022-03-01 ENCOUNTER — Encounter: Payer: Self-pay | Admitting: Oncology

## 2022-03-02 DIAGNOSIS — J301 Allergic rhinitis due to pollen: Secondary | ICD-10-CM | POA: Diagnosis not present

## 2022-03-02 NOTE — Progress Notes (Unsigned)
[place]  [phone] [phone]  ID: [name] OB: November 12, 1933  MR#: [id number]  SNK#:[id number]  Patient Care Team: Jonetta Osgood, NP as PCP - General (Nurse Practitioner) Lloyd Huger, MD as Consulting Physician (Oncology)   CHIEF COMPLAINT:  Multiple myeloma in relapse.  INTERVAL HISTORY: Patient returns to clinic today for routine 64-monthevaluation and continuation of Zometa and pomalidomide.  She has noticed increasing UTIs over the past month, but otherwise has felt well.  She is tolerating her treatments without significant side effects.  Her peripheral neuropathy is unchanged.  She has no other neurologic complaints. She denies any recent fevers or illnesses. She has a good appetite and denies weight loss.  She denies any chest pain, shortness of breath, cough, or hemoptysis.  She denies any nausea, vomiting, constipation, or diarrhea.  She has no melena or hematochezia.  Patient offers no further specific complaints today.  REVIEW OF SYSTEMS:   Review of Systems  Constitutional: Negative.  Negative for fever, malaise/fatigue and weight loss.  HENT:  Negative for congestion and sore throat.   Respiratory: Negative.  Negative for cough and shortness of breath.   Cardiovascular: Negative.  Negative for chest pain, palpitations and leg swelling.  Gastrointestinal: Negative.  Negative for abdominal pain, blood in stool, constipation, diarrhea, melena, nausea and vomiting.  Genitourinary: Negative.  Negative for dysuria.  Musculoskeletal: Negative.  Negative for back pain.  Skin: Negative.  Negative for rash.  Neurological:  Positive for tingling and sensory change. Negative for focal weakness and weakness.  Psychiatric/Behavioral: Negative.  The patient is not nervous/anxious and does not have insomnia.     As per HPI. Otherwise, a complete review of systems is negative.  PAST MEDICAL HISTORY: Past Medical History:   Diagnosis Date   Actinic keratosis    Arthritis    hands   Benign neoplasm of ascending colon    Cataract    GERD (gastroesophageal reflux disease)    Hyperlipemia    Hypertension 01/31/2015   Hypothyroidism    Melanoma ([facility] 04/28/2018   R mid to distal ant lat thigh. MM, SS, tumor thickness 0.260m antatomic level II. Excised: 05/13/18, margins free   Multiple [facility] ([facility]   [place] [facility] ([facility]   Osteoporosis    Shingles    Skin cancer    basal cell   Squamous cell carcinoma of skin 02/04/2014   Left pretibial. KA-like pattern   Squamous cell carcinoma of skin 09/15/2015   Right lat. superior ankle are. Superficial infiltration. Tx: EDC   Squamous cell carcinoma of skin 10/15/2016   Left medial mid lower leg. Tx: EDC   Squamous cell carcinoma of skin 11/27/2018   Right dorsum proximal forearm.   Squamous cell carcinoma of skin 09/09/2019   Right cheek lat. to mid nasolabial area. Tx: EDC   Squamous cell carcinoma of skin 12/09/2019   right pretibial below knee    PAST SURGICAL HISTORY: Past Surgical History:  Procedure Laterality Date   BREAST EXCISIONAL BIOPSY Right 15+ yrs ago   EXCISIONAL - NEG   CATARACT EXTRACTION W/ INTRAOCULAR LENS IMPLANT     CHOLECYSTECTOMY N/A 03/14/2017   Procedure: LAPAROSCOPIC CHOLECYSTECTOMY;  Surgeon: CoFlorene GlenMD;  Location: [facility];  Service: General;  Laterality: N/A;   COLONOSCOPY WITH PROPOFOL N/A 02/21/2015   Procedure: COLONOSCOPY WITH PROPOFOL;  Surgeon: DaLucilla LameMD;  [place] [facility] Service: End[place];  Laterality: N/A;   LAPAROSCOPIC HYSTERECTOMY  1980   POLYPECTOMY  02/21/2015   Procedure: POLYPECTOMY;  Surgeon: Lucilla Lame, MD;  Location: [place];  Service: Endoscopy;;    FAMILY HISTORY: Family History  Problem Relation Age of Onset   Heart disease Mother    Stroke Father    Heart disease Brother    Kidney cancer Neg Hx    Prostate cancer Neg  Hx    Bladder Cancer Neg Hx    Breast cancer Neg Hx     ADVANCED DIRECTIVES (Y/N):  N  HEALTH MAINTENANCE: Social History   Tobacco Use   Smoking status: Never   Smokeless tobacco: Never  Vaping Use   Vaping Use: Never used  Substance Use Topics   Alcohol use: No    Alcohol/week: 0.0 standard drinks of alcohol   Drug use: No     Colonoscopy:  PAP:  Bone density:  Lipid panel:  Allergies  Allergen Reactions   Revlimid [Lenalidomide]     Current Outpatient Medications  Medication Sig Dispense Refill   aspirin EC 81 MG tablet Take 81 mg by mouth daily.     Cyanocobalamin (B-12 PO) Take 500 mcg by mouth daily.      EPINEPHrine 0.3 mg/0.3 mL IJ SOAJ injection      fexofenadine (ALLEGRA) 180 MG tablet Take 180 mg by mouth daily.     levothyroxine (SYNTHROID) 50 MCG tablet TAKE 1 TABLET BY MOUTH DAILY BEFORE BREAKFAST 90 tablet 3   Multiple Vitamins-Minerals (CENTRUM SILVER PO) Take 1 tablet by mouth daily.     POMALYST 2 MG capsule TAKE 1 CAPSULE BY MOUTH EVERY DAY FOR 21 DAYS ON THEN 7 DAYS OFF EVERY 28 DAYS 21 capsule 0   triamcinolone (NASACORT) 55 MCG/ACT AERO nasal inhaler Place 2 sprays into the nose 2 (two) times daily as needed.     UNABLE TO FIND Inject 1 Dose as directed once a week. Allergy injections once a week     nitrofurantoin, macrocrystal-monohydrate, (MACROBID) 100 MG capsule Take 1 capsule (100 mg total) by mouth 2 (two) times daily. Take with food. 14 capsule 0   No current facility-administered medications for this visit.   Facility-Administered Medications Ordered in Other Visits  Medication Dose Route Frequency Provider Last Rate Last Admin   0.9 %  sodium chloride infusion   Intravenous Continuous Lloyd Huger, MD 10 mL/hr at 03/06/22 1336 New Bag at 03/06/22 1336   zoledronic acid (ZOMETA) 3 mg in sodium chloride 0.9 % 100 mL IVPB  3 mg Intravenous Once Lloyd Huger, MD        OBJECTIVE: Vitals:   03/06/22 1304  BP: 138/63   Pulse: 69  Temp: (!) 96.9 F (36.1 C)     Body mass index is 23.78 kg/m.    ECOG FS:0 - Asymptomatic  General: Well-developed, well-nourished, no acute distress. Eyes: Pink conjunctiva, anicteric sclera. HEENT: Normocephalic, moist mucous membranes. Lungs: No audible wheezing or coughing. Heart: Regular rate and rhythm. Abdomen: Soft, nontender, no obvious distention. Musculoskeletal: No edema, cyanosis, or clubbing. Neuro: Alert, answering all questions appropriately. Cranial nerves grossly intact. Skin: No rashes or petechiae noted. Psych: Normal affect.   LAB RESULTS:  Lab Results  Component Value Date   NA 138 02/27/2022   K 4.4 02/27/2022   CL 107 02/27/2022   CO2 25 02/27/2022   GLUCOSE 93 02/27/2022   BUN 21 02/27/2022   CREATININE 0.81 02/27/2022   CALCIUM 8.9 02/27/2022   PROT 6.3 (L) 10/11/2020   ALBUMIN  3.9 10/11/2020   AST 22 10/11/2020   ALT 15 10/11/2020   ALKPHOS 75 10/11/2020   BILITOT 0.5 10/11/2020   GFRNONAA >60 02/27/2022   GFRAA 40 (L) 03/01/2020    Lab Results  Component Value Date   WBC 4.3 02/27/2022   NEUTROABS 2.6 02/27/2022   HGB 13.5 02/27/2022   HCT 39.2 02/27/2022   MCV 95.8 02/27/2022   PLT 214 02/27/2022   Lab Results  Component Value Date   TOTALPROTELP 5.9 (L) 02/27/2022   ALBUMINELP 3.5 05/16/2021   A1GS 0.2 05/16/2021   A2GS 0.6 05/16/2021   BETS 0.8 05/16/2021   GAMS 0.5 05/16/2021   MSPIKE Not Observed 05/16/2021   SPEI Comment 05/16/2021     STUDIES: No results found.  ASSESSMENT:  Multiple myeloma in relapse.  PLAN:    1.  Multiple myeloma in relapse: Bone marrow biopsy on April 09, 2016 revealed 44% plasma cells and normal cytogenetics. Given the results of her metastatic bone survey on March 12, 2016, her elevated lambda free chains, and hypercalcemia, patient fit the criteria for multiple myeloma and was initially treated with single agent Revlimid.  She could not tolerate Revlimid and was switched to  pomalidomide 2 mg daily for 21 days with a 7-day break.  Metastatic bone survey on February 12, 2019 revealed new small lesions in her calvarium.  Patient's most recent M spike is now 0.0 and her immunoglobulins remain decreased.  Lambda chains continue to trend down further now 480.7. Patient completed 1 year of monthly treatment of Zometa, now only receives treatment every 3 months.  Continue Pomalyst as prescribed.  Proceed with dose reduce Zometa today.  Continue Revlimid as prescribed.  Return to clinic in 3 months with repeat laboratory, further evaluation, and continuation of treatment.   2.  Chronic renal insufficiency: Resolved.   3.  Peripheral neuropathy: Chronic and unchanged.  Patient was previously given a referral to acupuncture which she said did not help much.  4.  Hypocalcemia: Resolved.  Patient previously had hypercalcemia.  Proceed with Zometa as above. 5.  Recurrent UTIs: Patient is currently asymptomatic.  Monitor.   Patient expressed understanding and was in agreement with this plan. She also understands that She can call clinic at any time with any questions, concerns, or complaints.    Lloyd Huger, MD 03/06/22 1:50 PM

## 2022-03-05 DIAGNOSIS — H0015 Chalazion left lower eyelid: Secondary | ICD-10-CM | POA: Diagnosis not present

## 2022-03-05 DIAGNOSIS — H0014 Chalazion left upper eyelid: Secondary | ICD-10-CM | POA: Diagnosis not present

## 2022-03-05 LAB — IMMUNOFIXATION ELECTROPHORESIS
IgA: 70 mg/dL (ref 64–422)
IgG (Immunoglobin G), Serum: 633 mg/dL (ref 586–1602)
IgM (Immunoglobulin M), Srm: 24 mg/dL — ABNORMAL LOW (ref 26–217)
Total Protein ELP: 5.9 g/dL — ABNORMAL LOW (ref 6.0–8.5)

## 2022-03-06 ENCOUNTER — Inpatient Hospital Stay: Payer: Medicare PPO

## 2022-03-06 ENCOUNTER — Encounter: Payer: Self-pay | Admitting: Oncology

## 2022-03-06 ENCOUNTER — Inpatient Hospital Stay (HOSPITAL_BASED_OUTPATIENT_CLINIC_OR_DEPARTMENT_OTHER): Payer: Medicare PPO | Admitting: Oncology

## 2022-03-06 VITALS — BP 138/63 | HR 69 | Temp 96.9°F | Wt 130.0 lb

## 2022-03-06 DIAGNOSIS — C9000 Multiple myeloma not having achieved remission: Secondary | ICD-10-CM

## 2022-03-06 DIAGNOSIS — C9002 Multiple myeloma in relapse: Secondary | ICD-10-CM | POA: Diagnosis not present

## 2022-03-06 MED ORDER — SODIUM CHLORIDE 0.9 % IV SOLN
INTRAVENOUS | Status: DC
  Filled 2022-03-06: qty 250

## 2022-03-06 MED ORDER — ZOLEDRONIC ACID 4 MG/5ML IV CONC
3.0000 mg | Freq: Once | INTRAVENOUS | Status: AC
  Administered 2022-03-06: 3 mg via INTRAVENOUS
  Filled 2022-03-06: qty 3.75

## 2022-03-06 NOTE — Patient Instructions (Signed)
[facility]  Discharge Instructions: Thank you for choosing [facility] to provide your oncology and hematology care.  If you have a lab appointment with the Cancer Center, please go directly to the Cancer Center and check in at the registration area.  Wear comfortable clothing and clothing appropriate for easy access to any Portacath or PICC line.   We strive to give you quality time with your provider. You may need to reschedule your appointment if you arrive late (15 or more minutes).  Arriving late affects you and other patients whose appointments are after yours.  Also, if you miss three or more appointments without notifying the office, you may be dismissed from the clinic at the provider's discretion.      For prescription refill requests, have your pharmacy contact our office and allow 72 hours for refills to be completed.    Today you received the following chemotherapy and/or immunotherapy agents ZOMETA      To help prevent nausea and vomiting after your treatment, we encourage you to take your nausea medication as directed.  BELOW ARE SYMPTOMS THAT SHOULD BE REPORTED IMMEDIATELY: *FEVER GREATER THAN 100.4 F (38 C) OR HIGHER *CHILLS OR SWEATING *NAUSEA AND VOMITING THAT IS NOT CONTROLLED WITH YOUR NAUSEA MEDICATION *UNUSUAL SHORTNESS OF BREATH *UNUSUAL BRUISING OR BLEEDING *URINARY PROBLEMS (pain or burning when urinating, or frequent urination) *BOWEL PROBLEMS (unusual diarrhea, constipation, pain near the anus) TENDERNESS IN MOUTH AND THROAT WITH OR WITHOUT PRESENCE OF ULCERS (sore throat, sores in mouth, or a toothache) UNUSUAL RASH, SWELLING OR PAIN  UNUSUAL VAGINAL DISCHARGE OR ITCHING   Items with * indicate a potential emergency and should be followed up as soon as possible or go to the Emergency Department if any problems should occur.  Please show the CHEMOTHERAPY ALERT CARD or IMMUNOTHERAPY ALERT CARD at check-in to the  Emergency Department and triage nurse.  Should you have questions after your visit or need to cancel or reschedule your appointment, please contact [facility]  [phone] and follow the prompts.  Office hours are 8:00 a.m. to 4:30 p.m. Monday - Friday. Please note that voicemails left after 4:00 p.m. may not be returned until the following business day.  We are closed weekends and major holidays. You have access to a nurse at all times for urgent questions. Please call the main number to the clinic [phone] and follow the prompts.  For any non-urgent questions, you may also contact your provider using MyChart. We now offer e-Visits for anyone 18 and older to request care online for non-urgent symptoms. For details visit [email or web address].   Also download the MyChart app! Go to the app store, search "MyChart", open the app, select [facility], and log in with your MyChart username and password.  Masks are optional in the cancer centers. If you would like for your care team to wear a mask while they are taking care of you, please let them know. For doctor visits, patients may have with them one support person who is at least 86 years old. At this time, visitors are not allowed in the infusion area.  Zoledronic Acid Injection (Cancer) What is this medication? ZOLEDRONIC ACID (ZOE le dron ik AS id) treats high calcium levels in the blood caused by cancer. It may also be used with chemotherapy to treat weakened bones caused by cancer. It works by slowing down the release of calcium from bones. This lowers calcium levels in   your blood. It also makes your bones stronger and less likely to break (fracture). It belongs to a group of medications called bisphosphonates. This medicine may be used for other purposes; ask your health care provider or pharmacist if you have questions. COMMON BRAND NAME(S): Zometa, Zometa Powder What should I tell my care team before I  take this medication? They need to know if you have any of these conditions: Dehydration Dental disease Kidney disease Liver disease Low levels of calcium in the blood Lung or breathing disease, such as asthma Receiving steroids, such as dexamethasone or prednisone An unusual or allergic reaction to zoledronic acid, other medications, foods, dyes, or preservatives Pregnant or trying to get pregnant Breast-feeding How should I use this medication? This medication is injected into a vein. It is given by your care team in a hospital or clinic setting. Talk to your care team about the use of this medication in children. Special care may be needed. Overdosage: If you think you have taken too much of this medicine contact a poison control center or emergency room at once. NOTE: This medicine is only for you. Do not share this medicine with others. What if I miss a dose? Keep appointments for follow-up doses. It is important not to miss your dose. Call your care team if you are unable to keep an appointment. What may interact with this medication? Certain antibiotics given by injection Diuretics, such as bumetanide, furosemide NSAIDs, medications for pain and inflammation, such as ibuprofen or naproxen Teriparatide Thalidomide This list may not describe all possible interactions. Give your health care provider a list of all the medicines, herbs, non-prescription drugs, or dietary supplements you use. Also tell them if you smoke, drink alcohol, or use illegal drugs. Some items may interact with your medicine. What should I watch for while using this medication? Visit your care team for regular checks on your progress. It may be some time before you see the benefit from this medication. Some people who take this medication have severe bone, joint, or muscle pain. This medication may also increase your risk for jaw problems or a broken thigh bone. Tell your care team right away if you have severe  pain in your jaw, bones, joints, or muscles. Tell you care team if you have any pain that does not go away or that gets worse. Tell your dentist and dental surgeon that you are taking this medication. You should not have major dental surgery while on this medication. See your dentist to have a dental exam and fix any dental problems before starting this medication. Take good care of your teeth while on this medication. Make sure you see your dentist for regular follow-up appointments. You should make sure you get enough calcium and vitamin D while you are taking this medication. Discuss the foods you eat and the vitamins you take with your care team. Check with your care team if you have severe diarrhea, nausea, and vomiting, or if you sweat a lot. The loss of too much body fluid may make it dangerous for you to take this medication. You may need bloodwork while taking this medication. Talk to your care team if you wish to become pregnant or think you might be pregnant. This medication can cause serious birth defects. What side effects may I notice from receiving this medication? Side effects that you should report to your care team as soon as possible: Allergic reactions--skin rash, itching, hives, swelling of the face, lips, tongue, or throat   Kidney injury--decrease in the amount of urine, swelling of the ankles, hands, or feet Low calcium level--muscle pain or cramps, confusion, tingling, or numbness in the hands or feet Osteonecrosis of the jaw--pain, swelling, or redness in the mouth, numbness of the jaw, poor healing after dental work, unusual discharge from the mouth, visible bones in the mouth Severe bone, joint, or muscle pain Side effects that usually do not require medical attention (report to your care team if they continue or are bothersome): Constipation Fatigue Fever Loss of appetite Nausea Stomach pain This list may not describe all possible side effects. Call your doctor for  medical advice about side effects. You may report side effects to FDA at 1-800-FDA-1088. Where should I keep my medication? This medication is given in a hospital or clinic. It will not be stored at home. NOTE: This sheet is a summary. It may not cover all possible information. If you have questions about this medicine, talk to your doctor, pharmacist, or health care provider.  2023 Elsevier/Gold Standard (2021-07-13 00:00:00)    

## 2022-03-08 DIAGNOSIS — H40153 Residual stage of open-angle glaucoma, bilateral: Secondary | ICD-10-CM | POA: Diagnosis not present

## 2022-03-09 DIAGNOSIS — J301 Allergic rhinitis due to pollen: Secondary | ICD-10-CM | POA: Diagnosis not present

## 2022-03-14 ENCOUNTER — Telehealth: Payer: Self-pay | Admitting: Nurse Practitioner

## 2022-03-14 NOTE — Telephone Encounter (Signed)
Left vm to confirm 03/20/22 appointment-Toni

## 2022-03-16 DIAGNOSIS — J301 Allergic rhinitis due to pollen: Secondary | ICD-10-CM | POA: Diagnosis not present

## 2022-03-20 ENCOUNTER — Encounter: Payer: Self-pay | Admitting: Nurse Practitioner

## 2022-03-20 ENCOUNTER — Ambulatory Visit (INDEPENDENT_AMBULATORY_CARE_PROVIDER_SITE_OTHER): Payer: Medicare PPO | Admitting: Nurse Practitioner

## 2022-03-20 VITALS — BP 145/71 | HR 72 | Temp 97.4°F | Resp 16 | Ht 62.0 in | Wt 128.4 lb

## 2022-03-20 DIAGNOSIS — R300 Dysuria: Secondary | ICD-10-CM | POA: Diagnosis not present

## 2022-03-20 DIAGNOSIS — Z1239 Encounter for other screening for malignant neoplasm of breast: Secondary | ICD-10-CM

## 2022-03-20 DIAGNOSIS — E039 Hypothyroidism, unspecified: Secondary | ICD-10-CM | POA: Diagnosis not present

## 2022-03-20 DIAGNOSIS — Z23 Encounter for immunization: Secondary | ICD-10-CM

## 2022-03-20 DIAGNOSIS — E782 Mixed hyperlipidemia: Secondary | ICD-10-CM

## 2022-03-20 DIAGNOSIS — R053 Chronic cough: Secondary | ICD-10-CM

## 2022-03-20 DIAGNOSIS — Z0001 Encounter for general adult medical examination with abnormal findings: Secondary | ICD-10-CM

## 2022-03-20 DIAGNOSIS — C9001 Multiple myeloma in remission: Secondary | ICD-10-CM

## 2022-03-20 DIAGNOSIS — I10 Essential (primary) hypertension: Secondary | ICD-10-CM

## 2022-03-20 DIAGNOSIS — K219 Gastro-esophageal reflux disease without esophagitis: Secondary | ICD-10-CM

## 2022-03-20 MED ORDER — LEVOTHYROXINE SODIUM 50 MCG PO TABS: ORAL_TABLET | ORAL | 3 refills | Status: DC

## 2022-03-20 NOTE — Progress Notes (Signed)
[facility] [place], [place] [address]  Internal MEDICINE  Office Visit Note  Patient Name: Rhonda [name]  [id number]  [id number]  Date of Service: 03/20/2022  Chief Complaint  Patient presents with   Medicare Wellness   Gastroesophageal Reflux   Hypertension   Hyperlipidemia    HPI Rhonda [name] presents for an annual well visit and physical exam.  Well-appearing 88 year old female with multiple myeloma, hypertension, allergic rhinitis, GERD, hypothyroidism, and high cholesterol.  --mammogram deferred for now.  --on new cancer drug, pomalyst -- does cause some diarrhea as side effect, is also nephrotoxic.  --no preventive screenings due at this time  --labs deferred for now, oncologist is getting labs regularly.  --BP and other vital signs are stable.  --no other concerns     Current Medication: Outpatient Encounter Medications as of 03/20/2022  Medication Sig   aspirin EC 81 MG tablet Take 81 mg by mouth daily.   Cyanocobalamin (B-12 PO) Take 500 mcg by mouth daily.    EPINEPHrine 0.3 mg/0.3 mL IJ SOAJ injection    fexofenadine (ALLEGRA) 180 MG tablet Take 180 mg by mouth daily.   Multiple Vitamins-Minerals (CENTRUM SILVER PO) Take 1 tablet by mouth daily.   POMALYST 2 MG capsule TAKE 1 CAPSULE BY MOUTH EVERY DAY FOR 21 DAYS ON THEN 7 DAYS OFF EVERY 28 DAYS   triamcinolone (NASACORT) 55 MCG/ACT AERO nasal inhaler Place 2 sprays into the nose 2 (two) times daily as needed.   UNABLE TO FIND Inject 1 Dose as directed once a week. Allergy injections once a week   [DISCONTINUED] levothyroxine (SYNTHROID) 50 MCG tablet TAKE 1 TABLET BY MOUTH DAILY BEFORE BREAKFAST   levothyroxine (SYNTHROID) 50 MCG tablet TAKE 1 TABLET BY MOUTH DAILY BEFORE BREAKFAST   [DISCONTINUED] nitrofurantoin, macrocrystal-monohydrate, (MACROBID) 100 MG capsule Take 1 capsule (100 mg total) by mouth 2 (two) times daily. Take with food.   No facility-administered encounter  medications on file as of 03/20/2022.    Surgical History: Past Surgical History:  Procedure Laterality Date   BREAST EXCISIONAL BIOPSY Right 15+ yrs ago   EXCISIONAL - NEG   CATARACT EXTRACTION W/ INTRAOCULAR LENS IMPLANT     CHOLECYSTECTOMY N/A 03/14/2017   Procedure: LAPAROSCOPIC CHOLECYSTECTOMY;  Surgeon: Florene Glen, MD;  Location: [facility];  Service: General;  Laterality: N/A;   COLONOSCOPY WITH PROPOFOL N/A 02/21/2015   Procedure: COLONOSCOPY WITH PROPOFOL;  Surgeon: Lucilla Lame, MD;  Location: [facility];  Service: Endoscopy;  Laterality: N/A;   [place]   POLYPECTOMY  02/21/2015   Procedure: POLYPECTOMY;  Surgeon: Lucilla Lame, MD;  Location: [facility];  Service: Endoscopy;;    Medical History: Past Medical History:  Diagnosis Date   Actinic keratosis    Arthritis    hands   Benign neoplasm of ascending colon    Cataract    GERD (gastroesophageal reflux disease)    Hyperlipemia    Hypertension 01/31/2015   Hypothyroidism    Melanoma ([place]) 04/28/2018   R mid to distal ant lat thigh. MM, SS, tumor thickness 0.98m, antatomic level II. Excised: 05/13/18, margins free   Multiple myeloma ([facility] 04/18/2016   Multiple myeloma in remission ([facility] 04/18/2016   Osteoporosis    Shingles    Skin cancer    basal cell   Squamous cell carcinoma of skin 02/04/2014   Left pretibial. KA-like pattern   Squamous cell carcinoma of skin 09/15/2015   Right lat. superior ankle  are. Superficial infiltration. Tx: EDC   Squamous cell carcinoma of skin 10/15/2016   Left medial mid lower leg. Tx: EDC   Squamous cell carcinoma of skin 11/27/2018   Right dorsum proximal forearm.   Squamous cell carcinoma of skin 09/09/2019   Right cheek lat. to mid nasolabial area. Tx: EDC   Squamous cell carcinoma of skin 12/09/2019   right pretibial below knee    Family History: Family History  Problem Relation Age of Onset   Heart disease Mother    Stroke  Father    Heart disease Brother    Kidney cancer Neg Hx    Prostate cancer Neg Hx    Bladder Cancer Neg Hx    Breast cancer Neg Hx     Social History   Socioeconomic History   Marital status: Widowed    Spouse name: Not on file   Number of children: Not on file   Years of education: Not on file   Highest education level: Not on file  Occupational History   Not on file  Tobacco Use   Smoking status: Never   Smokeless tobacco: Never  Vaping Use   Vaping Use: Never used  Substance and Sexual Activity   Alcohol use: No    Alcohol/week: 0.0 standard drinks of alcohol   Drug use: No   Sexual activity: Not on file  Other Topics Concern   Not on file  Social History Narrative   Not on file   Social Determinants of Health   Financial Resource Strain: Not on file  Food Insecurity: Not on file  Transportation Needs: Not on file  Physical Activity: Not on file  Stress: Not on file  Social Connections: Not on file  Intimate Partner Violence: Not on file      Review of Systems  Constitutional:  Positive for fatigue. Negative for activity change, appetite change, chills, fever and unexpected weight change.  HENT: Negative.  Negative for congestion, ear pain, rhinorrhea, sore throat and trouble swallowing.   Eyes: Negative.   Respiratory:  Positive for cough. Negative for chest tightness, shortness of breath and wheezing.   Cardiovascular: Negative.  Negative for chest pain.  Gastrointestinal: Negative.  Negative for abdominal pain, blood in stool, constipation, diarrhea, nausea and vomiting.  Endocrine: Negative.   Genitourinary: Negative.  Negative for difficulty urinating, dysuria, frequency, hematuria and urgency.  Musculoskeletal: Negative.  Negative for arthralgias, back pain, joint swelling, myalgias and neck pain.  Skin: Negative.  Negative for rash and wound.  Allergic/Immunologic: Negative.  Negative for immunocompromised state.  Neurological: Negative.  Negative  for dizziness, seizures, numbness and headaches.  Hematological: Negative.   Psychiatric/Behavioral: Negative.  Negative for behavioral problems, self-injury and suicidal ideas. The patient is not nervous/anxious.     Vital Signs: BP (!) 145/71   Pulse 72   Temp (!) 97.4 F (36.3 C)   Resp 16   Ht _0  (1.575 m)   Wt 128 lb 6.4 oz (58.2 kg)   SpO2 96%   BMI 23.48 kg/m    Physical Exam Vitals reviewed.  Constitutional:      General: She is not in acute distress.    Appearance: She is well-developed. She is not diaphoretic.  HENT:     Head: Normocephalic and atraumatic.     Right Ear: External ear normal.     Left Ear: External ear normal.     Nose: Nose normal.     Mouth/Throat:     Pharynx: No oropharyngeal  exudate.  Eyes:     General: No scleral icterus.       Right eye: No discharge.        Left eye: No discharge.     Conjunctiva/sclera: Conjunctivae normal.     Pupils: Pupils are equal, round, and reactive to light.  Neck:     Thyroid: No thyromegaly.     Vascular: No JVD.     Trachea: No tracheal deviation.  Cardiovascular:     Rate and Rhythm: Normal rate and regular rhythm.     Heart sounds: Normal heart sounds. No murmur heard.    No friction rub. No gallop.  Pulmonary:     Effort: Pulmonary effort is normal. No respiratory distress.     Breath sounds: Normal breath sounds. No stridor. No wheezing or rales.  Chest:     Chest wall: No tenderness.  Abdominal:     General: Bowel sounds are normal. There is no distension.     Palpations: Abdomen is soft. There is no mass.     Tenderness: There is no abdominal tenderness. There is no guarding or rebound.  Musculoskeletal:        General: No tenderness or deformity. Normal range of motion.     Cervical back: Normal range of motion and neck supple.  Lymphadenopathy:     Cervical: No cervical adenopathy.  Skin:    General: Skin is warm and dry.     Coloration: Skin is not pale.     Findings: No erythema or  rash.  Neurological:     Mental Status: She is alert.     Cranial Nerves: No cranial nerve deficit.     Motor: No abnormal muscle tone.     Coordination: Coordination normal.     Deep Tendon Reflexes: Reflexes are normal and symmetric.  Psychiatric:        Behavior: Behavior normal.        Thought Content: Thought content normal.        Judgment: Judgment normal.        Assessment/Plan: 1. Encounter for general adult medical examination with abnormal findings Age-appropriate preventive screenings and vaccinations discussed, annual physical exam completed. Routine labs for health maintenance deferred for now. PHM updated.  - levothyroxine (SYNTHROID) 50 MCG tablet; TAKE 1 TABLET BY MOUTH DAILY BEFORE BREAKFAST  Dispense: 90 tablet; Refill: 3  2. Acquired hypothyroidism Stable, continue levothyroxine as prescribed.  - levothyroxine (SYNTHROID) 50 MCG tablet; TAKE 1 TABLET BY MOUTH DAILY BEFORE BREAKFAST  Dispense: 90 tablet; Refill: 3  3. Dysuria Routine urinalysis done - UA/M w/rflx Culture, Routine - Microscopic Examination      General Counseling: Elia verbalizes understanding of the findings of todays visit and agrees with plan of treatment. I have discussed any further diagnostic evaluation that may be needed or ordered today. We also reviewed her medications today. she has been encouraged to call the office with any questions or concerns that should arise related to todays visit.    Orders Placed This Encounter  Procedures   UA/M w/rflx Culture, Routine    Meds ordered this encounter  Medications   levothyroxine (SYNTHROID) 50 MCG tablet    Sig: TAKE 1 TABLET BY MOUTH DAILY BEFORE BREAKFAST    Dispense:  90 tablet    Refill:  3    Return in about 1 year (around 03/21/2023) for CPE, [place] PCP and otherwise as needed. .   Total time spent:30 Minutes Time spent includes review of chart, medications, test  results, and follow up plan with the patient.   [place]  Controlled Substance Database was reviewed by me.  This patient was seen by Jonetta Osgood, FNP-C in collaboration with Dr. Clayborn Bigness as a part of collaborative care agreement.  Dorcus Riga R. Valetta Fuller, MSN, FNP-C Internal medicine

## 2022-03-21 LAB — UA/M W/RFLX CULTURE, ROUTINE
Bilirubin, UA: NEGATIVE
Glucose, UA: NEGATIVE
Ketones, UA: NEGATIVE
Leukocytes,UA: NEGATIVE
Nitrite, UA: NEGATIVE
RBC, UA: NEGATIVE
Specific Gravity, UA: 1.011 (ref 1.005–1.030)
Urobilinogen, Ur: 0.2 mg/dL (ref 0.2–1.0)
pH, UA: 6 (ref 5.0–7.5)

## 2022-03-21 LAB — MICROSCOPIC EXAMINATION
Bacteria, UA: NONE SEEN
Casts: NONE SEEN /lpf
Epithelial Cells (non renal): NONE SEEN /hpf (ref 0–10)
RBC, Urine: NONE SEEN /hpf (ref 0–2)
WBC, UA: NONE SEEN /hpf (ref 0–5)

## 2022-03-24 ENCOUNTER — Other Ambulatory Visit: Payer: Self-pay | Admitting: Oncology

## 2022-03-24 DIAGNOSIS — C9000 Multiple myeloma not having achieved remission: Secondary | ICD-10-CM

## 2022-03-30 DIAGNOSIS — J301 Allergic rhinitis due to pollen: Secondary | ICD-10-CM | POA: Diagnosis not present

## 2022-04-03 ENCOUNTER — Telehealth: Payer: Self-pay | Admitting: *Deleted

## 2022-04-03 DIAGNOSIS — C9000 Multiple myeloma not having achieved remission: Secondary | ICD-10-CM

## 2022-04-03 MED ORDER — POMALIDOMIDE 2 MG PO CAPS: ORAL_CAPSULE | ORAL | 0 refills | Status: DC

## 2022-04-03 NOTE — Telephone Encounter (Signed)
Rx edited and resent.

## 2022-04-03 NOTE — Telephone Encounter (Signed)
Pharmacy called stating that there is not a [employer] auth # on on prescription submitted Please obtain auth number and resubmit prescription.

## 2022-04-05 ENCOUNTER — Telehealth: Payer: Self-pay

## 2022-04-06 DIAGNOSIS — J301 Allergic rhinitis due to pollen: Secondary | ICD-10-CM | POA: Diagnosis not present

## 2022-04-06 MED ORDER — AMOXICILLIN-POT CLAVULANATE 875-125 MG PO TABS: 1.0000 | ORAL_TABLET | Freq: Two times a day (BID) | ORAL | 0 refills | Status: DC

## 2022-04-12 NOTE — Telephone Encounter (Signed)
done

## 2022-04-13 DIAGNOSIS — J301 Allergic rhinitis due to pollen: Secondary | ICD-10-CM | POA: Diagnosis not present

## 2022-04-16 DIAGNOSIS — J301 Allergic rhinitis due to pollen: Secondary | ICD-10-CM | POA: Diagnosis not present

## 2022-04-20 DIAGNOSIS — J301 Allergic rhinitis due to pollen: Secondary | ICD-10-CM | POA: Diagnosis not present

## 2022-04-25 ENCOUNTER — Other Ambulatory Visit: Payer: Self-pay | Admitting: Oncology

## 2022-04-25 DIAGNOSIS — C9000 Multiple myeloma not having achieved remission: Secondary | ICD-10-CM

## 2022-04-27 DIAGNOSIS — J301 Allergic rhinitis due to pollen: Secondary | ICD-10-CM | POA: Diagnosis not present

## 2022-04-29 ENCOUNTER — Encounter: Payer: Self-pay | Admitting: Nurse Practitioner

## 2022-05-11 DIAGNOSIS — J301 Allergic rhinitis due to pollen: Secondary | ICD-10-CM | POA: Diagnosis not present

## 2022-05-18 ENCOUNTER — Other Ambulatory Visit: Payer: Self-pay

## 2022-05-18 DIAGNOSIS — J301 Allergic rhinitis due to pollen: Secondary | ICD-10-CM | POA: Diagnosis not present

## 2022-05-18 DIAGNOSIS — C9000 Multiple myeloma not having achieved remission: Secondary | ICD-10-CM

## 2022-05-18 MED ORDER — POMALIDOMIDE 2 MG PO CAPS: 2.0000 mg | ORAL_CAPSULE | Freq: Every day | ORAL | 0 refills | Status: DC

## 2022-05-20 ENCOUNTER — Other Ambulatory Visit: Payer: Self-pay | Admitting: Oncology

## 2022-05-20 DIAGNOSIS — C9000 Multiple myeloma not having achieved remission: Secondary | ICD-10-CM

## 2022-05-22 ENCOUNTER — Encounter: Payer: Self-pay | Admitting: Oncology

## 2022-05-30 ENCOUNTER — Inpatient Hospital Stay: Payer: Medicare PPO | Attending: Oncology

## 2022-05-30 DIAGNOSIS — G629 Polyneuropathy, unspecified: Secondary | ICD-10-CM | POA: Diagnosis not present

## 2022-05-30 DIAGNOSIS — Z87440 Personal history of urinary (tract) infections: Secondary | ICD-10-CM | POA: Diagnosis not present

## 2022-05-30 DIAGNOSIS — D696 Thrombocytopenia, unspecified: Secondary | ICD-10-CM | POA: Insufficient documentation

## 2022-05-30 DIAGNOSIS — C9000 Multiple myeloma not having achieved remission: Secondary | ICD-10-CM

## 2022-05-30 DIAGNOSIS — C9002 Multiple myeloma in relapse: Secondary | ICD-10-CM | POA: Insufficient documentation

## 2022-05-30 LAB — CBC WITH DIFFERENTIAL/PLATELET
Abs Immature Granulocytes: 0.03 10*3/uL (ref 0.00–0.07)
Basophils Absolute: 0.1 10*3/uL (ref 0.0–0.1)
Basophils Relative: 2 %
Eosinophils Absolute: 0.1 10*3/uL (ref 0.0–0.5)
Eosinophils Relative: 2 %
HCT: 38.2 % (ref 36.0–46.0)
Hemoglobin: 13 g/dL (ref 12.0–15.0)
Immature Granulocytes: 1 %
Lymphocytes Relative: 23 %
Lymphs Abs: 1.1 10*3/uL (ref 0.7–4.0)
MCH: 32.4 pg (ref 26.0–34.0)
MCHC: 34 g/dL (ref 30.0–36.0)
MCV: 95.3 fL (ref 80.0–100.0)
Monocytes Absolute: 0.8 10*3/uL (ref 0.1–1.0)
Monocytes Relative: 16 %
Neutro Abs: 2.6 10*3/uL (ref 1.7–7.7)
Neutrophils Relative %: 56 %
Platelets: 137 10*3/uL — ABNORMAL LOW (ref 150–400)
RBC: 4.01 MIL/uL (ref 3.87–5.11)
RDW: 13.3 % (ref 11.5–15.5)
WBC: 4.7 10*3/uL (ref 4.0–10.5)
nRBC: 0 % (ref 0.0–0.2)

## 2022-05-30 LAB — BASIC METABOLIC PANEL
Anion gap: 7 (ref 5–15)
BUN: 23 mg/dL (ref 8–23)
CO2: 24 mmol/L (ref 22–32)
Calcium: 8.6 mg/dL — ABNORMAL LOW (ref 8.9–10.3)
Chloride: 107 mmol/L (ref 98–111)
Creatinine, Ser: 0.87 mg/dL (ref 0.44–1.00)
GFR, Estimated: >60 mL/min (ref >60)
Glucose, Bld: 119 mg/dL — ABNORMAL HIGH (ref 70–99)
Potassium: 3.6 mmol/L (ref 3.5–5.1)
Sodium: 138 mmol/L (ref 135–145)

## 2022-05-31 LAB — KAPPA/LAMBDA LIGHT CHAINS
Kappa free light chain: 30.4 mg/L — ABNORMAL HIGH (ref 3.3–19.4)
Kappa, lambda light chain ratio: 0.08 — ABNORMAL LOW (ref 0.26–1.65)
Lambda free light chains: 395.4 mg/L — ABNORMAL HIGH (ref 5.7–26.3)

## 2022-05-31 LAB — IGG, IGA, IGM
IgA: 79 mg/dL (ref 64–422)
IgG (Immunoglobin G), Serum: 633 mg/dL (ref 586–1602)
IgM (Immunoglobulin M), Srm: 26 mg/dL (ref 26–217)

## 2022-06-01 DIAGNOSIS — J301 Allergic rhinitis due to pollen: Secondary | ICD-10-CM | POA: Diagnosis not present

## 2022-06-05 LAB — IMMUNOFIXATION ELECTROPHORESIS
IgA: 80 mg/dL (ref 64–422)
IgG (Immunoglobin G), Serum: 625 mg/dL (ref 586–1602)
IgM (Immunoglobulin M), Srm: 28 mg/dL (ref 26–217)
Total Protein ELP: 5.7 g/dL — ABNORMAL LOW (ref 6.0–8.5)

## 2022-06-07 ENCOUNTER — Inpatient Hospital Stay: Payer: Medicare PPO

## 2022-06-07 ENCOUNTER — Inpatient Hospital Stay (HOSPITAL_BASED_OUTPATIENT_CLINIC_OR_DEPARTMENT_OTHER): Payer: Medicare PPO | Admitting: Oncology

## 2022-06-07 ENCOUNTER — Ambulatory Visit: Payer: Medicare PPO

## 2022-06-07 ENCOUNTER — Ambulatory Visit: Payer: Medicare PPO | Admitting: Oncology

## 2022-06-07 ENCOUNTER — Encounter: Payer: Self-pay | Admitting: Oncology

## 2022-06-07 VITALS — BP 129/69 | HR 78 | Temp 96.8°F | Resp 18 | Wt 128.0 lb

## 2022-06-07 DIAGNOSIS — C9002 Multiple myeloma in relapse: Secondary | ICD-10-CM | POA: Diagnosis not present

## 2022-06-07 DIAGNOSIS — C9000 Multiple myeloma not having achieved remission: Secondary | ICD-10-CM | POA: Diagnosis not present

## 2022-06-07 DIAGNOSIS — D696 Thrombocytopenia, unspecified: Secondary | ICD-10-CM | POA: Diagnosis not present

## 2022-06-07 DIAGNOSIS — Z87440 Personal history of urinary (tract) infections: Secondary | ICD-10-CM | POA: Diagnosis not present

## 2022-06-07 DIAGNOSIS — G629 Polyneuropathy, unspecified: Secondary | ICD-10-CM | POA: Diagnosis not present

## 2022-06-07 MED ORDER — ZOLEDRONIC ACID 4 MG/5ML IV CONC
3.0000 mg | Freq: Once | INTRAVENOUS | Status: AC
  Administered 2022-06-07: 3 mg via INTRAVENOUS
  Filled 2022-06-07: qty 3.75

## 2022-06-07 MED ORDER — SODIUM CHLORIDE 0.9 % IV SOLN
INTRAVENOUS | Status: DC
  Filled 2022-06-07 (×2): qty 250

## 2022-06-07 NOTE — Patient Instructions (Signed)

## 2022-06-07 NOTE — Progress Notes (Signed)
Pt reports 1-2 days of diarrhea during her 7 days off of her treatment. Low energy. Neuropathy in hands and feet. Appetite is good on treatment. Low off of treatment.

## 2022-06-07 NOTE — Progress Notes (Signed)
[place]  [phone] [phone]) [phone]  ID: [name] OB: 1933-12-13  MR#: [id number]  WLK#:[id number]  Patient Care Team: Jonetta Osgood, NP as PCP - General (Nurse Practitioner) Lloyd Huger, MD as Consulting Physician (Oncology)   CHIEF COMPLAINT:  Multiple myeloma in relapse.  INTERVAL HISTORY: Patient returns to clinic today for routine 7-monthevaluation and continuation of Zometa and pomalidomide.  She continues to have a chronic peripheral neuropathy, but otherwise feels well.  She has no other neurologic complaints. She denies any recent fevers or illnesses. She has a good appetite and denies weight loss.  She denies any chest pain, shortness of breath, cough, or hemoptysis.  She denies any nausea, vomiting, constipation, or diarrhea.  She has no melena or hematochezia.  Patient offers no further specific complaints today.  REVIEW OF SYSTEMS:   Review of Systems  Constitutional: Negative.  Negative for fever, malaise/fatigue and weight loss.  HENT:  Negative for congestion and sore throat.   Respiratory: Negative.  Negative for cough and shortness of breath.   Cardiovascular: Negative.  Negative for chest pain, palpitations and leg swelling.  Gastrointestinal: Negative.  Negative for abdominal pain, blood in stool, constipation, diarrhea, melena, nausea and vomiting.  Genitourinary: Negative.  Negative for dysuria.  Musculoskeletal: Negative.  Negative for back pain.  Skin: Negative.  Negative for rash.  Neurological:  Positive for tingling and sensory change. Negative for focal weakness and weakness.  Psychiatric/Behavioral: Negative.  The patient is not nervous/anxious and does not have insomnia.     As per HPI. Otherwise, a complete review of systems is negative.  PAST MEDICAL HISTORY: Past Medical History:  Diagnosis Date   Actinic keratosis    Arthritis    hands   Benign neoplasm of ascending colon    Cataract     GERD (gastroesophageal reflux disease)    Hyperlipemia    Hypertension 01/31/2015   Hypothyroidism    [place]an[place] 04/[place]   R mid to distal ant lat thigh. MM, SS, tumor thickness 0.244m antatomic level II. Excised: 05/13/18, margins free   [place] [facility]   Multiple myeloma in remission ([facility]/01/2016   Osteoporosis    Shingles    Skin cancer    basal cell   Squamous cell carcinoma of skin 02/04/2014   Left pretibial. KA-like pattern   Squamous cell carcinoma of skin 09/15/2015   Right lat. superior ankle are. Superficial infiltration. Tx: EDC   Squamous cell carcinoma of skin 10/15/2016   Left medial mid lower leg. Tx: EDC   Squamous cell carcinoma of skin 11/27/2018   Right dorsum proximal forearm.   Squamous cell carcinoma of skin 09/09/2019   Right cheek lat. to mid nasolabial area. Tx: EDC   Squamous cell carcinoma of skin 12/09/2019   right pretibial below knee    PAST SURGICAL HISTORY: Past Surgical History:  Procedure Laterality Date   BREAST EXCISIONAL BIOPSY Right 15+ yrs ago   EXCISIONAL - NEG   CATARACT EXTRACTION W/ INTRAOCULAR LENS IMPLANT     CHOLECYSTECTOMY N/A 03/14/2017   Procedure: LAPAROSCOPIC CHOLECYSTECTOMY;  Surgeon: CoFlorene GlenMD;  Location: [facility];  Service: General;  Laterality: N/A;   COLONOSCOPY WITH PROPOFOL N/A 02/21/2015   Procedure: COLONOSCOPY WITH PROPOFOL;  Surgeon: DaLucilla LameMD;  [facility] Service: Endoscopy;  Laterality: N/A;   [place] POLYPECTOMY  02/21/2015   Procedure: POLYPECTOMY;  Surgeon: DaLucilla LameMD;  Location:  [place];  Service: Endoscopy;;    FAMILY HISTORY: Family History  Problem Relation Age of Onset   Heart disease Mother    Stroke Father    Heart disease Brother    Kidney cancer Neg Hx    Prostate cancer Neg Hx    Bladder Cancer Neg Hx    Breast cancer Neg Hx     ADVANCED DIRECTIVES (Y/N):  N  HEALTH  MAINTENANCE: Social History   Tobacco Use   Smoking status: Never   Smokeless tobacco: Never  Vaping Use   Vaping Use: Never used  Substance Use Topics   Alcohol use: No    Alcohol/week: 0.0 standard drinks of alcohol   Drug use: No     Colonoscopy:  PAP:  Bone density:  Lipid panel:  Allergies  Allergen Reactions   Revlimid [Lenalidomide]     Current Outpatient Medications  Medication Sig Dispense Refill   aspirin EC 81 MG tablet Take 81 mg by mouth daily.     Cyanocobalamin (B-12 PO) Take 500 mcg by mouth daily.      EPINEPHrine 0.3 mg/0.3 mL IJ SOAJ injection      fexofenadine (ALLEGRA) 180 MG tablet Take 180 mg by mouth daily.     levothyroxine (SYNTHROID) 50 MCG tablet TAKE 1 TABLET BY MOUTH DAILY BEFORE BREAKFAST 90 tablet 3   Multiple Vitamins-Minerals (CENTRUM SILVER PO) Take 1 tablet by mouth daily.     pomalidomide (POMALYST) 2 MG capsule Take 1 capsule (2 mg total) by mouth daily. [employer] Auth # [id number]     Date Obtained 05/18/2022 21 capsule 0   triamcinolone (NASACORT) 55 MCG/ACT AERO nasal inhaler Place 2 sprays into the nose 2 (two) times daily as needed.     UNABLE TO FIND Inject 1 Dose as directed once a week. Allergy injections once a week     No current facility-administered medications for this visit.    OBJECTIVE: Vitals:   06/07/22 1328  BP: 129/69  Pulse: 78  Resp: 18  Temp: (!) 96.8 F (36 C)  SpO2: 99%     Body mass index is 23.41 kg/m.    ECOG FS:0 - Asymptomatic  General: Well-developed, well-nourished, no acute distress. Eyes: Pink conjunctiva, anicteric sclera. HEENT: Normocephalic, moist mucous membranes. Lungs: No audible wheezing or coughing. Heart: Regular rate and rhythm. Abdomen: Soft, nontender, no obvious distention. Musculoskeletal: No edema, cyanosis, or clubbing. Neuro: Alert, answering all questions appropriately. Cranial nerves grossly intact. Skin: No rashes or petechiae noted. Psych: Normal affect.  LAB  RESULTS:  Lab Results  Component Value Date   NA 138 05/30/2022   K 3.6 05/30/2022   CL 107 05/30/2022   CO2 24 05/30/2022   GLUCOSE 119 (H) 05/30/2022   BUN 23 05/30/2022   CREATININE 0.87 05/30/2022   CALCIUM 8.6 (L) 05/30/2022   PROT 6.3 (L) 10/11/2020   ALBUMIN 3.9 10/11/2020   AST 22 10/11/2020   ALT 15 10/11/2020   ALKPHOS 75 10/11/2020   BILITOT 0.5 10/11/2020   GFRNONAA >60 05/30/2022   GFRAA 40 (L) 03/01/2020    Lab Results  Component Value Date   WBC 4.7 05/30/2022   NEUTROABS 2.6 05/30/2022   HGB 13.0 05/30/2022   HCT 38.2 05/30/2022   MCV 95.3 05/30/2022   PLT 137 (L) 05/30/2022   Lab Results  Component Value Date   TOTALPROTELP 5.7 (L) 05/30/2022   ALBUMINELP 3.5 05/16/2021   A1GS 0.2 05/16/2021   A2GS 0.6 05/16/2021   BETS  0.8 05/16/2021   GAMS 0.5 05/16/2021   MSPIKE Not Observed 05/16/2021   SPEI Comment 05/16/2021     STUDIES: No results found.  ASSESSMENT:  Multiple myeloma in relapse.  PLAN:    1.  Multiple myeloma in relapse: Bone marrow biopsy on April 09, 2016 revealed 44% plasma cells and normal cytogenetics. Given the results of her metastatic bone survey on March 12, 2016, her elevated lambda free chains, and hypercalcemia, patient fit the criteria for multiple myeloma and was initially treated with single agent Revlimid.  She could not tolerate Revlimid and was switched to pomalidomide 2 mg daily for 21 days with a 7-day break.  Metastatic bone survey on February 12, 2019 revealed new small lesions in her calvarium.  Patient's most recent M spike is now 0.0 and her immunoglobulins remain decreased.  Lambda chains continue to trend down and are now 395.4. Patient completed 1 year of monthly treatment of Zometa, now only receives treatment every 3 months.  Continue pomalidomide as prescribed.  Proceed with dose reduced Zometa today.  Return to clinic in 3 months for further evaluation and continuation of treatment.    2.  Chronic renal  insufficiency: Resolved.   3.  Peripheral neuropathy: Chronic and unchanged.  Patient was previously given a referral to acupuncture which she said did not help much.  4.  Hypocalcemia: Mild.  Patient previously had hypercalcemia.  Proceed with Zometa as above. 5.  Recurrent UTIs: Patient is currently asymptomatic.  Continue monitoring and treatment per primary care. 6.  Thrombocytopenia: Mild, monitor.  Patient expressed understanding and was in agreement with this plan. She also understands that She can call clinic at any time with any questions, concerns, or complaints.    Lloyd Huger, MD 06/07/22 1:50 PM

## 2022-06-12 IMAGING — MG DIGITAL SCREENING BILAT W/ TOMO W/ CAD
8 series · 9 of 24 positions shown · non-contrast
Comparison: Previous exam(s).

CLINICAL DATA: Screening.

EXAM:
DIGITAL SCREENING BILATERAL MAMMOGRAM WITH TOMO AND CAD

[R CC synth-2D]
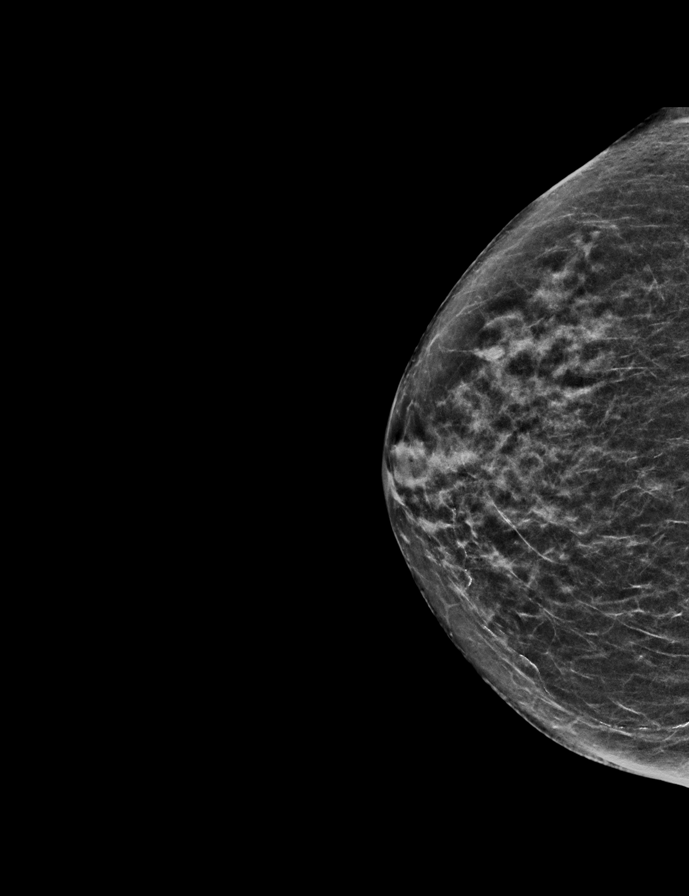

[L MLO synth-2D]
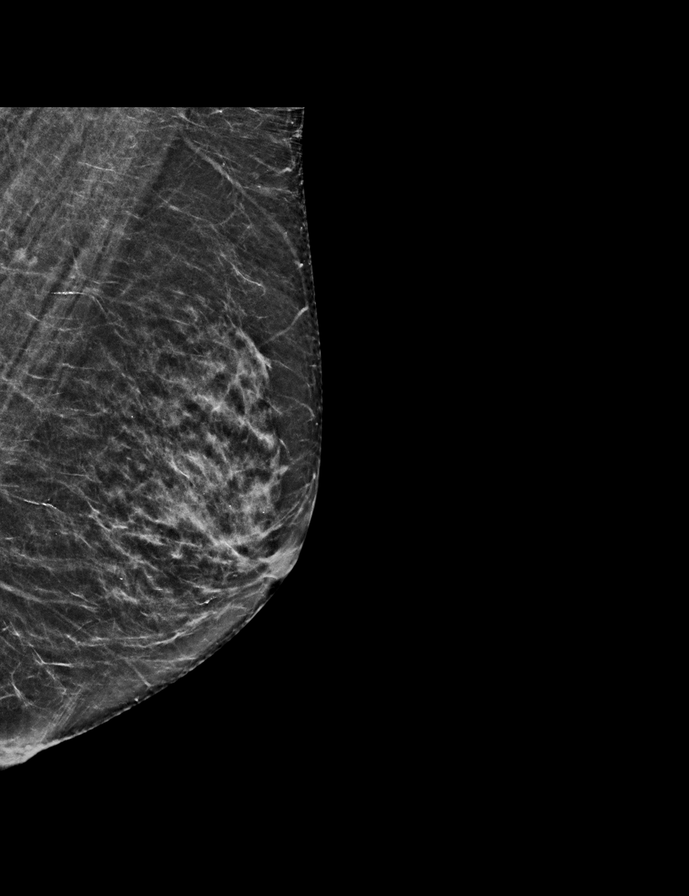

[R MLO synth-2D]
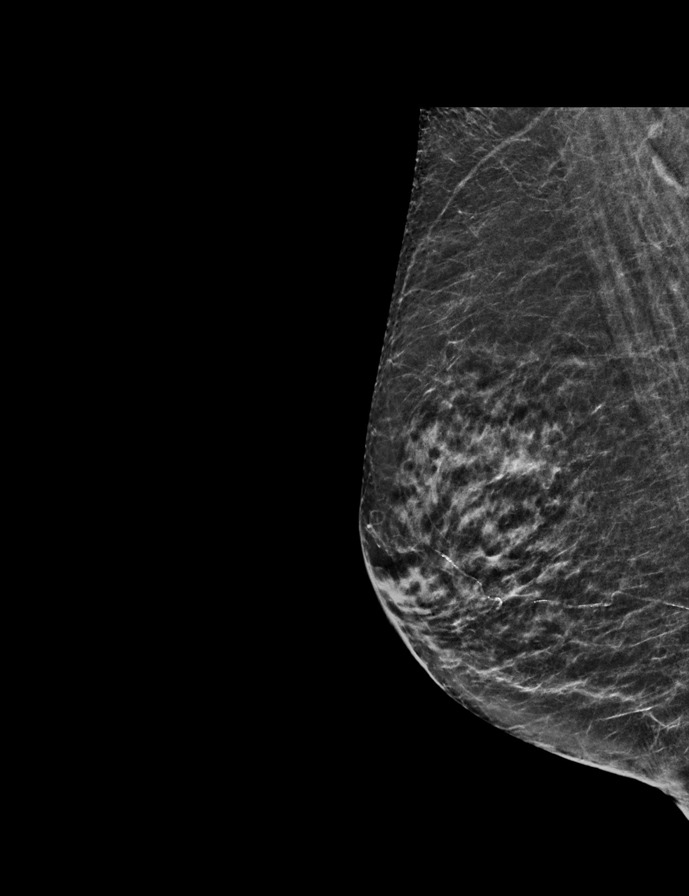

[L CC synth-2D]
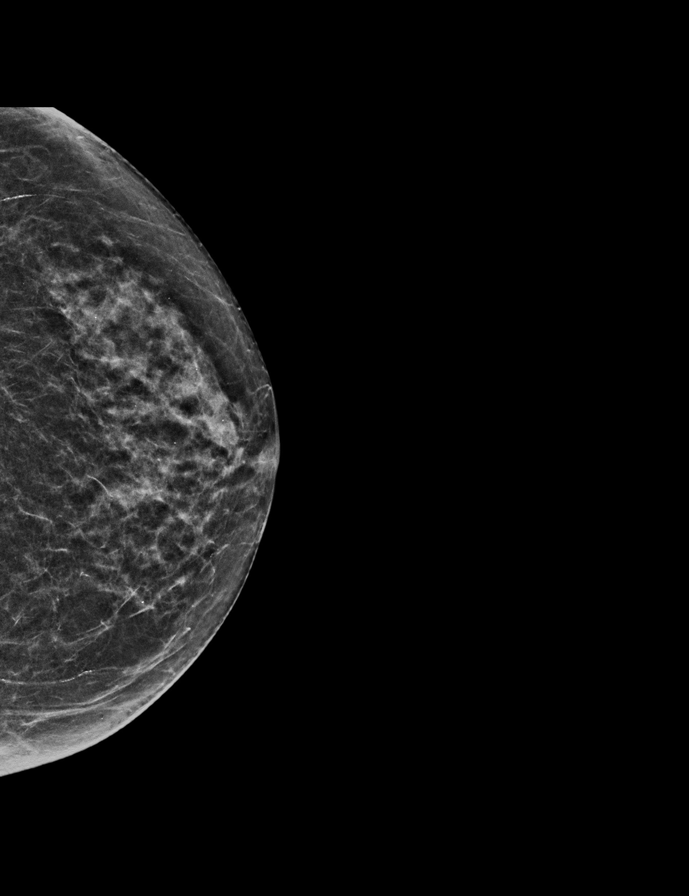

[L CC tomo · 2 of 49 frames shown]
[frame 16/49]
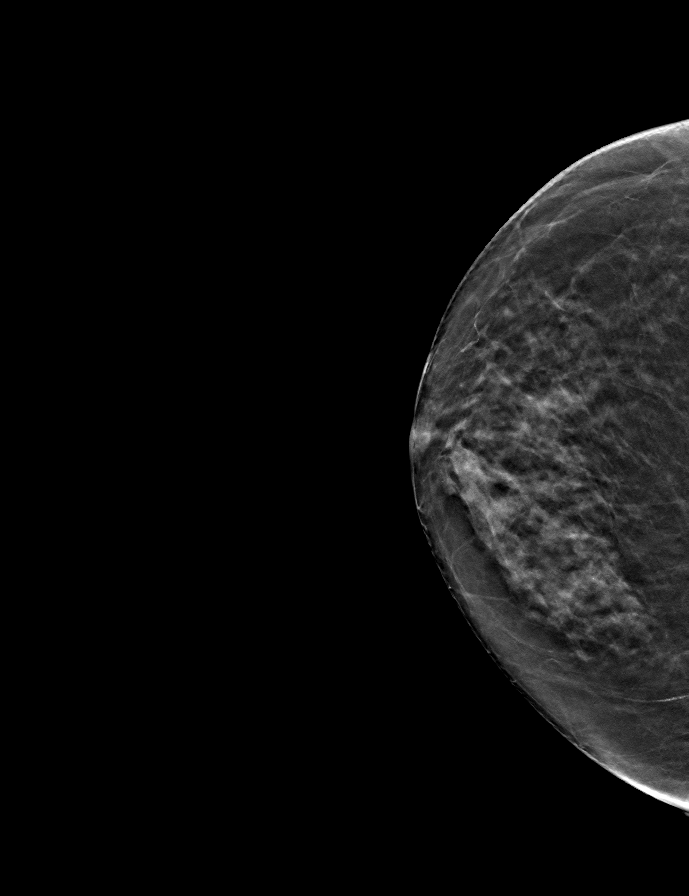
[frame 25/49]
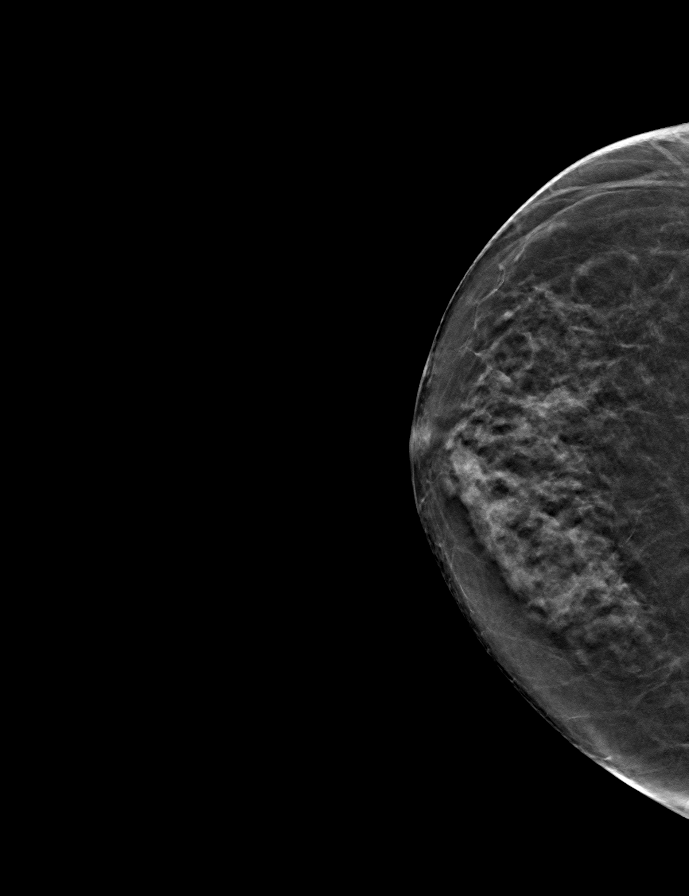

[L MLO tomo · tomo slice 25/49.0]
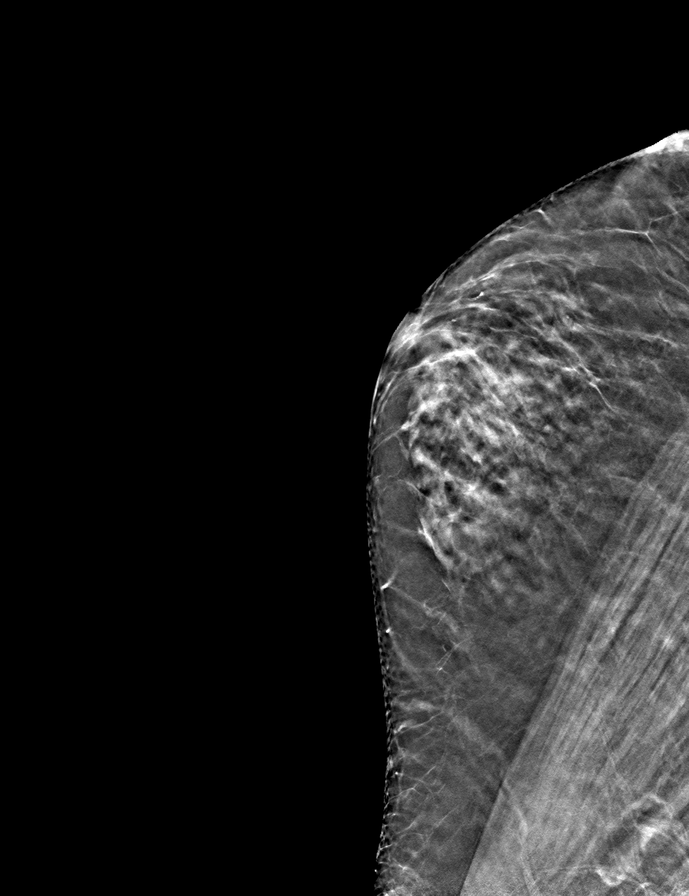

[R MLO tomo · tomo slice 25/49.0]
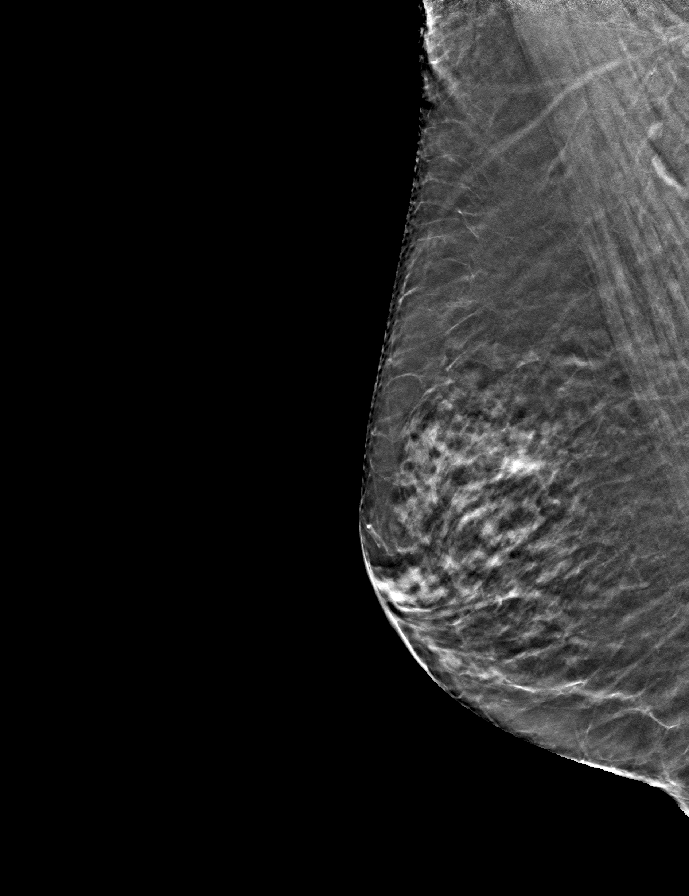

[R CC tomo · tomo slice 25/48.0]
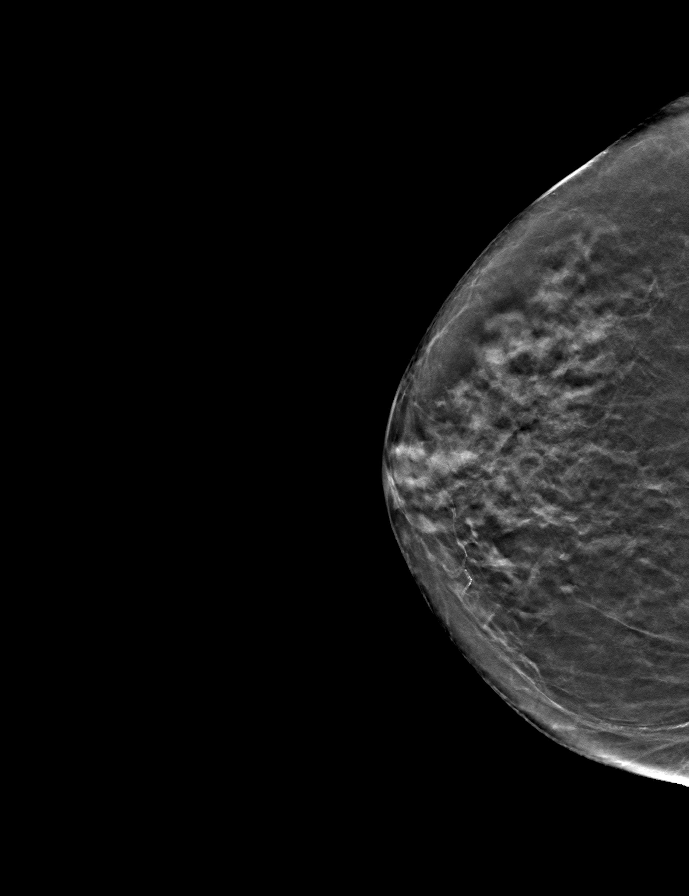

[9 of 24 positions shown; findings below may reference images not displayed]

ACR Breast Density Category c: The breast tissue is heterogeneously
dense, which may obscure small masses.
FINDINGS: There are no findings suspicious for malignancy. Images were
processed with CAD.
IMPRESSION: No mammographic evidence of malignancy. A result letter of this
screening mammogram will be mailed directly to the patient.

RECOMMENDATION:
Screening mammogram in one year. (Code:FT-U-LHB)

BI-RADS CATEGORY  1: Negative.

## 2022-06-13 ENCOUNTER — Ambulatory Visit: Payer: Medicare PPO | Admitting: Nurse Practitioner

## 2022-06-13 ENCOUNTER — Encounter: Payer: Self-pay | Admitting: Nurse Practitioner

## 2022-06-13 ENCOUNTER — Other Ambulatory Visit: Payer: Self-pay | Admitting: Oncology

## 2022-06-13 VITALS — BP 152/73 | HR 73 | Temp 97.2°F | Resp 16 | Ht 62.0 in | Wt 130.0 lb

## 2022-06-13 DIAGNOSIS — N390 Urinary tract infection, site not specified: Secondary | ICD-10-CM | POA: Diagnosis not present

## 2022-06-13 DIAGNOSIS — R300 Dysuria: Secondary | ICD-10-CM

## 2022-06-13 DIAGNOSIS — C9000 Multiple myeloma not having achieved remission: Secondary | ICD-10-CM

## 2022-06-13 LAB — POCT URINALYSIS DIPSTICK
Bilirubin, UA: NEGATIVE
Blood, UA: NEGATIVE
Glucose, UA: NEGATIVE
Ketones, UA: NEGATIVE
Leukocytes, UA: NEGATIVE
Nitrite, UA: NEGATIVE
Protein, UA: NEGATIVE
Spec Grav, UA: <=1.005 — AB (ref 1.010–1.025)
Urobilinogen, UA: 0.2 E.U./dL
pH, UA: 6 (ref 5.0–8.0)

## 2022-06-13 MED ORDER — NITROFURANTOIN MONOHYD MACRO 100 MG PO CAPS: 100.0000 mg | ORAL_CAPSULE | Freq: Two times a day (BID) | ORAL | 0 refills | Status: AC

## 2022-06-13 NOTE — Progress Notes (Signed)
[facility] [place], [place] [address]  Internal MEDICINE  Office Visit Note  Patient Name: Rhonda [name]  [id number]  [id number]  Date of Service: 06/13/2022  Chief Complaint  Patient presents with   Acute Visit    Possible kidney infection.      HPI [place] presents for an acute sick visit for  Symptoms started day after christmas 06/05/22.  Dark colored urine, back pain, and an odor to the urine Started drinking more water and some cranberry juice.  Back still hurts and low appetite Urinalysis was negative No vaginal itching or burning     Current Medication:  Outpatient Encounter Medications as of 06/13/2022  Medication Sig   aspirin EC 81 MG tablet Take 81 mg by mouth daily.   Cyanocobalamin (B-12 PO) Take 500 mcg by mouth daily.    EPINEPHrine 0.3 mg/0.3 mL IJ SOAJ injection    fexofenadine (ALLEGRA) 180 MG tablet Take 180 mg by mouth daily.   levothyroxine (SYNTHROID) 50 MCG tablet TAKE 1 TABLET BY MOUTH DAILY BEFORE BREAKFAST   Multiple Vitamins-Minerals (CENTRUM SILVER PO) Take 1 tablet by mouth daily.   nitrofurantoin, macrocrystal-monohydrate, (MACROBID) 100 MG capsule Take 1 capsule (100 mg total) by mouth 2 (two) times daily for 7 days. Take with food   pomalidomide (POMALYST) 2 MG capsule Take 1 capsule (2 mg total) by mouth daily. [employer] Auth # [id number]     Date Obtained 05/18/2022   triamcinolone (NASACORT) 55 MCG/ACT AERO nasal inhaler Place 2 sprays into the nose 2 (two) times daily as needed.   UNABLE TO FIND Inject 1 Dose as directed once a week. Allergy injections once a week   Facility-Administered Encounter Medications as of 06/13/2022  Medication   0.9 %  sodium chloride infusion      Medical History: Past Medical History:  Diagnosis Date   Actinic keratosis    Arthritis    hands   Benign neoplasm of ascending colon    Cataract    GERD (gastroesophageal reflux disease)    Hyperlipemia    Hypertension 01/31/2015    Hypothyroidism    Melanoma ([place]) 04/28/2018   R mid to distal ant lat thigh. MM, SS, tumor thickness 0.61m, antatomic level II. Excised: 05/13/18, margins free   [place] [facility]oma ([facility] 04/18/2016   [facility] [place] 04/18/[place]   Osteoporosis    Shingles    Skin cancer    basal cell   Squamous cell carcinoma of skin 02/04/2014   Left pretibial. KA-like pattern   Squamous cell carcinoma of skin 09/15/2015   Right lat. superior ankle are. Superficial infiltration. Tx: EDC   Squamous cell carcinoma of skin 10/15/2016   Left medial mid lower leg. Tx: EDC   Squamous cell carcinoma of skin 11/27/2018   Right dorsum proximal forearm.   Squamous cell carcinoma of skin 09/09/2019   Right cheek lat. to mid nasolabial area. Tx: EDC   Squamous cell carcinoma of skin 12/09/2019   right pretibial below knee     Vital Signs: BP (!) 152/73   Pulse 73   Temp (!) 97.2 F (36.2 C)   Resp 16   Ht _0  (1.575 m)   Wt 130 lb (59 kg)   SpO2 94%   BMI 23.78 kg/m    Review of Systems  Constitutional:  Positive for appetite change and fatigue.  HENT: Negative.    Respiratory: Negative.    Cardiovascular: Negative.  Negative for chest pain and  palpitations.  Gastrointestinal: Negative.   Genitourinary:  Positive for dysuria, flank pain, frequency and urgency. Negative for hematuria, vaginal bleeding, vaginal discharge and vaginal pain.       Dark urine with odor  Musculoskeletal:  Positive for back pain.    Physical Exam Vitals reviewed.  Constitutional:      General: She is not in acute distress.    Appearance: Normal appearance. She is normal weight. She is ill-appearing.  HENT:     Head: Normocephalic and atraumatic.  Eyes:     Pupils: Pupils are equal, round, and reactive to light.  Cardiovascular:     Rate and Rhythm: Normal rate and regular rhythm.  Pulmonary:     Effort: Pulmonary effort is normal. No respiratory distress.  Neurological:     Mental Status:  She is alert and oriented to person, place, and time.  Psychiatric:        Mood and Affect: Mood normal.        Behavior: Behavior normal.       Assessment/Plan: 1. Urinary tract infection without hematuria, site unspecified Empiric antibiotic tx prescribed, urine culture sent, also BMP ordered to check urine function - nitrofurantoin, macrocrystal-monohydrate, (MACROBID) 100 MG capsule; Take 1 capsule (100 mg total) by mouth 2 (two) times daily for 7 days. Take with food  Dispense: 14 capsule; Refill: 0 - CULTURE, URINE COMPREHENSIVE - BMP8+1AC  2. Dysuria Urinalysis negative but patient symptomatic, will send culture anyway - POCT urinalysis dipstick - CULTURE, URINE COMPREHENSIVE - BMP8+1AC  General Counseling: Nila verbalizes understanding of the findings of todays visit and agrees with plan of treatment. I have discussed any further diagnostic evaluation that may be needed or ordered today. We also reviewed her medications today. she has been encouraged to call the office with any questions or concerns that should arise related to todays visit.    Counseling:    Orders Placed This Encounter  Procedures   CULTURE, URINE COMPREHENSIVE   BMP8+1AC   POCT urinalysis dipstick    Meds ordered this encounter  Medications   nitrofurantoin, macrocrystal-monohydrate, (MACROBID) 100 MG capsule    Sig: Take 1 capsule (100 mg total) by mouth 2 (two) times daily for 7 days. Take with food    Dispense:  14 capsule    Refill:  0    Fill today    Return if symptoms worsen or fail to improve.  [place] Controlled Substance Database was reviewed by me for overdose risk score (ORS)  Time spent:20 Minutes Time spent with patient included reviewing progress notes, labs, imaging studies, and discussing plan for follow up.   This patient was seen by Jonetta Osgood, FNP-C in collaboration with Dr. Clayborn Bigness as a part of collaborative care agreement.  Zyheir Daft R. Valetta Fuller, MSN,  FNP-C Internal Medicine

## 2022-06-14 ENCOUNTER — Other Ambulatory Visit: Payer: Self-pay | Admitting: Pharmacist

## 2022-06-17 LAB — CULTURE, URINE COMPREHENSIVE

## 2022-06-22 DIAGNOSIS — J301 Allergic rhinitis due to pollen: Secondary | ICD-10-CM | POA: Diagnosis not present

## 2022-06-25 ENCOUNTER — Ambulatory Visit: Payer: Medicare PPO | Admitting: Dermatology

## 2022-06-25 VITALS — BP 135/85 | HR 62

## 2022-06-25 DIAGNOSIS — L814 Other melanin hyperpigmentation: Secondary | ICD-10-CM

## 2022-06-25 DIAGNOSIS — Z1283 Encounter for screening for malignant neoplasm of skin: Secondary | ICD-10-CM

## 2022-06-25 DIAGNOSIS — L821 Other seborrheic keratosis: Secondary | ICD-10-CM

## 2022-06-25 DIAGNOSIS — L820 Inflamed seborrheic keratosis: Secondary | ICD-10-CM | POA: Diagnosis not present

## 2022-06-25 DIAGNOSIS — C44629 Squamous cell carcinoma of skin of left upper limb, including shoulder: Secondary | ICD-10-CM | POA: Diagnosis not present

## 2022-06-25 DIAGNOSIS — Z8589 Personal history of malignant neoplasm of other organs and systems: Secondary | ICD-10-CM

## 2022-06-25 DIAGNOSIS — Z85828 Personal history of other malignant neoplasm of skin: Secondary | ICD-10-CM

## 2022-06-25 DIAGNOSIS — D229 Melanocytic nevi, unspecified: Secondary | ICD-10-CM

## 2022-06-25 DIAGNOSIS — Z85820 Personal history of malignant melanoma of skin: Secondary | ICD-10-CM | POA: Diagnosis not present

## 2022-06-25 DIAGNOSIS — L570 Actinic keratosis: Secondary | ICD-10-CM

## 2022-06-25 DIAGNOSIS — D485 Neoplasm of uncertain behavior of skin: Secondary | ICD-10-CM

## 2022-06-25 DIAGNOSIS — L578 Other skin changes due to chronic exposure to nonionizing radiation: Secondary | ICD-10-CM | POA: Diagnosis not present

## 2022-06-25 NOTE — Patient Instructions (Addendum)
Electrodesiccation and Curettage ("Scrape and Burn") Wound Care Instructions  Leave the original bandage on for 24 hours if possible.  If the bandage becomes soaked or soiled before that time, it is OK to remove it and examine the wound.  A small amount of post-operative bleeding is normal.  If excessive bleeding occurs, remove the bandage, place gauze over the site and apply continuous pressure (no peeking) over the area for 30 minutes. If this does not work, please call our clinic as soon as possible or page your doctor if it is after hours.   Once a day, cleanse the wound with soap and water. It is fine to shower. If a thick crust develops you may use a Q-tip dipped into dilute hydrogen peroxide (mix 1:1 with water) to dissolve it.  Hydrogen peroxide can slow the healing process, so use it only as needed.    After washing, apply petroleum jelly (Vaseline) or an antibiotic ointment if your doctor prescribed one for you, followed by a bandage.    For best healing, the wound should be covered with a layer of ointment at all times. If you are not able to keep the area covered with a bandage to hold the ointment in place, this may mean re-applying the ointment several times a day.  Continue this wound care until the wound has healed and is no longer open. It may take several weeks for the wound to heal and close.  Itching and mild discomfort is normal during the healing process.  If you have any discomfort, you can take Tylenol (acetaminophen) or ibuprofen as directed on the bottle. (Please do not take these if you have an allergy to them or cannot take them for another reason).  Some redness, tenderness and white or yellow material in the wound is normal healing.  If the area becomes very sore and red, or develops a thick yellow-green material (pus), it may be infected; please notify us.    Wound healing continues for up to one year following surgery. It is not unusual to experience pain in the scar  from time to time during the interval.  If the pain becomes severe or the scar thickens, you should notify the office.    A slight amount of redness in a scar is expected for the first six months.  After six months, the redness will fade and the scar will soften and fade.  The color difference becomes less noticeable with time.  If there are any problems, return for a post-op surgery check at your earliest convenience.  To improve the appearance of the scar, you can use silicone scar gel, cream, or sheets (such as Mederma or Serica) every night for up to one year. These are available over the counter (without a prescription).  Please call our office at [phone] for any questions or concerns.     Due to recent changes in healthcare laws, you may see results of your pathology and/or laboratory studies on MyChart before the doctors have had a chance to review them. We understand that in some cases there may be results that are confusing or concerning to you. Please understand that not all results are received at the same time and often the doctors may need to interpret multiple results in order to provide you with the best plan of care or course of treatment. Therefore, we ask that you please give us 2 business days to thoroughly review all your results before contacting the office for clarification. Should   we see a critical lab result, you will be contacted sooner.   If You Need Anything After Your Visit  If you have any questions or concerns for your doctor, please call our main line at [phone] and press option 4 to reach your doctor's medical assistant. If no one answers, please leave a voicemail as directed and we will return your call as soon as possible. Messages left after 4 pm will be answered the following business day.   You may also send us a message via MyChart. We typically respond to MyChart messages within 1-2 business days.  For prescription refills, please ask your  pharmacy to contact our office. Our fax number is [phone].  If you have an urgent issue when the clinic is closed that cannot wait until the next business day, you can page your doctor at the number below.    Please note that while we do our best to be available for urgent issues outside of office hours, we are not available 24/7.   If you have an urgent issue and are unable to reach us, you may choose to seek medical care at your doctor's office, retail clinic, urgent care center, or emergency room.  If you have a medical emergency, please immediately call 911 or go to the emergency department.  Pager Numbers  - Dr. Kowalski: [phone]  - Dr. Moye: [phone]  - Dr. Stewart: [phone]  In the event of inclement weather, please call our main line at [phone] for an update on the status of any delays or closures.  Dermatology Medication Tips: Please keep the boxes that topical medications come in in order to help keep track of the instructions about where and how to use these. Pharmacies typically print the medication instructions only on the boxes and not directly on the medication tubes.   If your medication is too expensive, please contact our office at [phone] option 4 or send us a message through MyChart.   We are unable to tell what your co-pay for medications will be in advance as this is different depending on your insurance coverage. However, we may be able to find a substitute medication at lower cost or fill out paperwork to get insurance to cover a needed medication.   If a prior authorization is required to get your medication covered by your insurance company, please allow us 1-2 business days to complete this process.  Drug prices often vary depending on where the prescription is filled and some pharmacies may offer cheaper prices.  The website www.goodrx.com contains coupons for medications through different pharmacies. The prices here do not  account for what the cost may be with help from insurance (it may be cheaper with your insurance), but the website can give you the price if you did not use any insurance.  - You can print the associated coupon and take it with your prescription to the pharmacy.  - You may also stop by our office during regular business hours and pick up a GoodRx coupon card.  - If you need your prescription sent electronically to a different pharmacy, notify our office through [facility] MyChart or by phone at [phone] option 4.     Si Usted Necesita Algo Despus de Su Visita  Tambin puede enviarnos un mensaje a travs de MyChart. Por lo general respondemos a los mensajes de MyChart en el transcurso de 1 a 2 das hbiles.  Para renovar recetas, por favor pida a su farmacia que se ponga en   contacto con nuestra oficina. Nuestro nmero de fax es el [phone].  Si tiene un asunto urgente cuando la clnica est cerrada y que no puede esperar hasta el siguiente da hbil, puede llamar/localizar a su doctor(a) al nmero que aparece a continuacin.   Por favor, tenga en cuenta que aunque hacemos todo lo posible para estar disponibles para asuntos urgentes fuera del horario de oficina, no estamos disponibles las 24 horas del da, los 7 das de la semana.   Si tiene un problema urgente y no puede comunicarse con nosotros, puede optar por buscar atencin mdica  en el consultorio de su doctor(a), en una clnica privada, en un centro de atencin urgente o en una sala de emergencias.  Si tiene una emergencia mdica, por favor llame inmediatamente al 911 o vaya a la sala de emergencias.  Nmeros de bper  - Dr. Kowalski: [phone]  - Dra. Moye: [phone]  - Dra. Stewart: [phone]  En caso de inclemencias del tiempo, por favor llame a nuestra lnea principal al [phone] para una actualizacin sobre el estado de cualquier retraso o cierre.  Consejos para la medicacin en dermatologa: Por  favor, guarde las cajas en las que vienen los medicamentos de uso tpico para ayudarle a seguir las instrucciones sobre dnde y cmo usarlos. Las farmacias generalmente imprimen las instrucciones del medicamento slo en las cajas y no directamente en los tubos del medicamento.   Si su medicamento es muy caro, por favor, pngase en contacto con nuestra oficina llamando al [phone] y presione la opcin 4 o envenos un mensaje a travs de MyChart.   No podemos decirle cul ser su copago por los medicamentos por adelantado ya que esto es diferente dependiendo de la cobertura de su seguro. Sin embargo, es posible que podamos encontrar un medicamento sustituto a menor costo o llenar un formulario para que el seguro cubra el medicamento que se considera necesario.   Si se requiere una autorizacin previa para que su compaa de seguros cubra su medicamento, por favor permtanos de 1 a 2 das hbiles para completar este proceso.  Los precios de los medicamentos varan con frecuencia dependiendo del lugar de dnde se surte la receta y alguna farmacias pueden ofrecer precios ms baratos.  El sitio web www.goodrx.com tiene cupones para medicamentos de diferentes farmacias. Los precios aqu no tienen en cuenta lo que podra costar con la ayuda del seguro (puede ser ms barato con su seguro), pero el sitio web puede darle el precio si no utiliz ningn seguro.  - Puede imprimir el cupn correspondiente y llevarlo con su receta a la farmacia.  - Tambin puede pasar por nuestra oficina durante el horario de atencin regular y recoger una tarjeta de cupones de GoodRx.  - Si necesita que su receta se enve electrnicamente a una farmacia diferente, informe a nuestra oficina a travs de MyChart de [place] o por telfono llamando al [phone] y presione la opcin 4.  

## 2022-06-25 NOTE — Progress Notes (Unsigned)
Follow-Up Visit   Subjective  Rhonda [name] is a 89 y.o. female who presents for the following: Annual Exam (Hx MM, SCC, Aks, ISKs). The patient presents for Total-Body Skin Exam (TBSE) for skin cancer screening and mole check.  The patient has spots, moles and lesions to be evaluated, some may be new or changing and the patient has concerns that these could be cancer.  The following portions of the chart were reviewed this encounter and updated as appropriate:   Tobacco  Allergies  Meds  Problems  Med Hx  Surg Hx  Fam Hx     Review of Systems:  No other skin or systemic complaints except as noted in HPI or Assessment and Plan.  Objective  Well appearing patient in no apparent distress; mood and affect are within normal limits.  A full examination was performed including scalp, head, eyes, ears, nose, lips, neck, chest, axillae, abdomen, back, buttocks, bilateral upper extremities, bilateral lower extremities, hands, feet, fingers, toes, fingernails, and toenails. All findings within normal limits unless otherwise noted below.  Right Ear x 1, L cheek x 1, L nose x 1 (3) Erythematous thin papules/macules with gritty scale.   R chest x 1 Erythematous stuck-on, waxy papule or plaque  L ant forearm 1.2 cm hyperkeratotic papule.       Assessment & Plan  AK (actinic keratosis) (3) Right Ear x 1, L cheek x 1, L nose x 1 Destruction of lesion - Right Ear x 1, L cheek x 1, L nose x 1 Complexity: simple   Destruction method: cryotherapy   Informed consent: discussed and consent obtained   Timeout:  patient name, date of birth, surgical site, and procedure verified Lesion destroyed using liquid nitrogen: Yes   Region frozen until ice ball extended beyond lesion: Yes   Outcome: patient tolerated procedure well with no complications   Post-procedure details: wound care instructions given    Inflamed seborrheic keratosis R chest x 1 Symptomatic, irritating, patient would  like treated. Destruction of lesion - R chest x 1 Complexity: simple   Destruction method: cryotherapy   Informed consent: discussed and consent obtained   Timeout:  patient name, date of birth, surgical site, and procedure verified Lesion destroyed using liquid nitrogen: Yes   Region frozen until ice ball extended beyond lesion: Yes   Outcome: patient tolerated procedure well with no complications   Post-procedure details: wound care instructions given    Neoplasm of uncertain behavior of skin L ant forearm Epidermal / dermal shaving  Lesion diameter (cm):  1.2 Informed consent: discussed and consent obtained   Timeout: patient name, date of birth, surgical site, and procedure verified   Procedure prep:  Patient was prepped and draped in usual sterile fashion Prep type:  Isopropyl alcohol Anesthesia: the lesion was anesthetized in a standard fashion   Anesthetic:  1% lidocaine w/ epinephrine 1-100,000 buffered w/ 8.4% NaHCO3 Instrument used: flexible razor blade   Hemostasis achieved with: pressure, aluminum chloride and electrodesiccation   Outcome: patient tolerated procedure well   Post-procedure details: sterile dressing applied and wound care instructions given   Dressing type: bandage and petrolatum    Destruction of lesion Complexity: extensive   Destruction method: electrodesiccation and curettage   Informed consent: discussed and consent obtained   Timeout:  patient name, date of birth, surgical site, and procedure verified Procedure prep:  Patient was prepped and draped in usual sterile fashion Prep type:  Isopropyl alcohol Anesthesia: the lesion was anesthetized  in a standard fashion   Anesthetic:  1% lidocaine w/ epinephrine 1-100,000 buffered w/ 8.4% NaHCO3 Curettage performed in three different directions: Yes   Electrodesiccation performed over the curetted area: Yes   Lesion length (cm):  1.2 Lesion width (cm):  1.2 Margin per side (cm):  0.2 Final wound  size (cm):  1.6 Hemostasis achieved with:  pressure, aluminum chloride and electrodesiccation Outcome: patient tolerated procedure well with no complications   Post-procedure details: sterile dressing applied and wound care instructions given   Dressing type: bandage and petrolatum    Specimen 1 - Surgical pathology Differential Diagnosis: D48.5 r/o SCC vs other ED&C today Check Margins: No  Lentigines - Scattered tan macules - Due to sun exposure - Benign-appearing, observe - Recommend daily broad spectrum sunscreen SPF 30+ to sun-exposed areas, reapply every 2 hours as needed. - Call for any changes  Seborrheic Keratoses - Stuck-on, waxy, tan-brown papules and/or plaques  - Benign-appearing - Discussed benign etiology and prognosis. - Observe - Call for any changes  Melanocytic Nevi - Tan-brown and/or pink-flesh-colored symmetric macules and papules - Benign appearing on exam today - Observation - Call clinic for new or changing moles - Recommend daily use of broad spectrum spf 30+ sunscreen to sun-exposed areas.   Hemangiomas - Red papules - Discussed benign nature - Observe - Call for any changes  Actinic Damage - Chronic condition, secondary to cumulative UV/sun exposure - diffuse scaly erythematous macules with underlying dyspigmentation - Recommend daily broad spectrum sunscreen SPF 30+ to sun-exposed areas, reapply every 2 hours as needed.  - Staying in the shade or wearing long sleeves, sun glasses (UVA+UVB protection) and wide brim hats (4-inch brim around the entire circumference of the hat) are also recommended for sun protection.  - Call for new or changing lesions.  History of Squamous Cell Carcinoma of the Skin - No evidence of recurrence today - No lymphadenopathy - Recommend regular full body skin exams - Recommend daily broad spectrum sunscreen SPF 30+ to sun-exposed areas, reapply every 2 hours as needed.  - Call if any new or changing lesions are  noted between office visits  History of Melanoma - No evidence of recurrence today - No lymphadenopathy - Recommend regular full body skin exams - Recommend daily broad spectrum sunscreen SPF 30+ to sun-exposed areas, reapply every 2 hours as needed.  - Call if any new or changing lesions are noted between office visits  Skin cancer screening performed today.  Return in about 6 months (around 12/24/2022) for TBSE.  Luther Redo, CMA, am acting as scribe for Sarina Ser, MD . Documentation: I have reviewed the above documentation for accuracy and completeness, and I agree with the above.  Sarina Ser, MD

## 2022-06-27 ENCOUNTER — Encounter: Payer: Self-pay | Admitting: Dermatology

## 2022-06-29 DIAGNOSIS — J301 Allergic rhinitis due to pollen: Secondary | ICD-10-CM | POA: Diagnosis not present

## 2022-07-02 ENCOUNTER — Telehealth: Payer: Self-pay

## 2022-07-02 NOTE — Telephone Encounter (Signed)
Advised patient of results/hd  

## 2022-07-02 NOTE — Telephone Encounter (Signed)
-----  Message from Ralene Bathe, MD sent at 07/02/2022  1:46 PM EST ----- Diagnosis Skin , left ant forearm WELL DIFFERENTIATED SQUAMOUS CELL CARCINOMA  Cancer - SCC Already treated Recheck next visit

## 2022-07-05 DIAGNOSIS — H40153 Residual stage of open-angle glaucoma, bilateral: Secondary | ICD-10-CM | POA: Diagnosis not present

## 2022-07-06 DIAGNOSIS — J301 Allergic rhinitis due to pollen: Secondary | ICD-10-CM | POA: Diagnosis not present

## 2022-07-09 DIAGNOSIS — J301 Allergic rhinitis due to pollen: Secondary | ICD-10-CM | POA: Diagnosis not present

## 2022-07-10 ENCOUNTER — Other Ambulatory Visit: Payer: Self-pay | Admitting: Oncology

## 2022-07-10 DIAGNOSIS — C9000 Multiple myeloma not having achieved remission: Secondary | ICD-10-CM

## 2022-07-11 ENCOUNTER — Encounter: Payer: Self-pay | Admitting: Nurse Practitioner

## 2022-07-11 ENCOUNTER — Telehealth: Payer: Medicare PPO | Admitting: Nurse Practitioner

## 2022-07-11 DIAGNOSIS — R051 Acute cough: Secondary | ICD-10-CM

## 2022-07-11 DIAGNOSIS — J0100 Acute maxillary sinusitis, unspecified: Secondary | ICD-10-CM

## 2022-07-11 MED ORDER — AMOXICILLIN-POT CLAVULANATE 875-125 MG PO TABS: 1.0000 | ORAL_TABLET | Freq: Two times a day (BID) | ORAL | 0 refills | Status: DC

## 2022-07-11 MED ORDER — HYDROCOD POLI-CHLORPHE POLI ER 10-8 MG/5ML PO SUER: 5.0000 mL | Freq: Two times a day (BID) | ORAL | 0 refills | Status: DC | PRN

## 2022-07-11 NOTE — Progress Notes (Signed)
[facility] [place], [place] [address]  Internal MEDICINE  Telephone Visit  Patient Name: Rhonda [name]  [id number]  [id number]  Date of Service: 07/11/2022  I connected with the patient at 1600 by telephone and verified the patients identity using two identifiers.   I discussed the limitations, risks, security and privacy concerns of performing an evaluation and management service by telephone and the availability of in person appointments. I also discussed with the patient that there may be a patient responsible charge related to the service.  The patient expressed understanding and agrees to proceed.    Chief Complaint  Patient presents with   Telephone Screen    Coughing, chills, covid test negative, runny nose [phone]   Telephone Assessment   Cough   Sore Throat    HPI Rhonda [name] presents for a telehealth virtual visit for Cough, sore throat, chills, runny nose, fatigue and malaise.  Negative for covid Onset was last night.    Current Medication: Outpatient Encounter Medications as of 07/11/2022  Medication Sig   amoxicillin-clavulanate (AUGMENTIN) 875-125 MG tablet Take 1 tablet by mouth 2 (two) times daily.   aspirin EC 81 MG tablet Take 81 mg by mouth daily.   chlorpheniramine-HYDROcodone (TUSSIONEX) 10-8 MG/5ML Take 5 mLs by mouth every 12 (twelve) hours as needed for cough.   Cyanocobalamin (B-12 PO) Take 500 mcg by mouth daily.    EPINEPHrine 0.3 mg/0.3 mL IJ SOAJ injection    fexofenadine (ALLEGRA) 180 MG tablet Take 180 mg by mouth daily.   levothyroxine (SYNTHROID) 50 MCG tablet TAKE 1 TABLET BY MOUTH DAILY BEFORE BREAKFAST   Multiple Vitamins-Minerals (CENTRUM SILVER PO) Take 1 tablet by mouth daily.   POMALYST 2 MG capsule TAKE 1 CAPSULE (2 MG TOTAL) BY MOUTH DAILY FOR 21 DAYS, THEN HOLD FOR 7 DAYS. REPEAT EVERY 28 DAYS   triamcinolone (NASACORT) 55 MCG/ACT AERO nasal inhaler Place 2 sprays into the nose 2 (two) times daily as  needed.   UNABLE TO FIND Inject 1 Dose as directed once a week. Allergy injections once a week   Facility-Administered Encounter Medications as of 07/11/2022  Medication   0.9 %  sodium chloride infusion    Surgical History: Past Surgical History:  Procedure Laterality Date   BREAST EXCISIONAL BIOPSY Right 15+ yrs ago   EXCISIONAL - NEG   CATARACT EXTRACTION W/ INTRAOCULAR LENS IMPLANT     CHOLECYSTECTOMY N/A 03/14/2017   Procedure: LAPAROSCOPIC CHOLECYSTECTOMY;  Surgeon: Florene Glen, MD;  Location: [facility];  Service: General;  Laterality: N/A;   COLONOSCOPY WITH PROPOFOL N/A 02/21/2015   Procedure: COLONOSCOPY WITH PROPOFOL;  Surgeon: Lucilla Lame, MD;  Location: [facility];  Service: Endoscopy;  Laterality: N/A;   [place]   POLYPECTOMY  02/21/2015   Procedure: POLYPECTOMY;  Surgeon: Lucilla Lame, MD;  Location: [facility];  Service: Endoscopy;;    Medical History: Past Medical History:  Diagnosis Date   Actinic keratosis    Arthritis    hands   Benign neoplasm of ascending colon    Cataract    GERD (gastroesophageal reflux disease)    Hyperlipemia    Hypertension 01/31/2015   Hypothyroidism    Melanoma ([place]) 04/28/2018   R mid to distal ant lat thigh. MM, SS, tumor thickness 0.63m, antatomic level II. Excised: 05/13/18, margins free   [place] 04/18/[place]   [place] myel[place] in [place] 04/18/2016   Osteoporosis    Shingles  Skin cancer    basal cell   Squamous cell carcinoma of skin 02/04/2014   Left pretibial. KA-like pattern   Squamous cell carcinoma of skin 09/15/2015   Right lat. superior ankle are. Superficial infiltration. Tx: EDC   Squamous cell carcinoma of skin 10/15/2016   Left medial mid lower leg. Tx: EDC   Squamous cell carcinoma of skin 11/27/2018   Right dorsum proximal forearm.   Squamous cell carcinoma of skin 09/09/2019   Right cheek lat. to mid nasolabial area. Tx: EDC   Squamous  cell carcinoma of skin 12/09/2019   right pretibial below knee   Squamous cell carcinoma of skin 06/25/2022   Left ant forearm - EDC    Family History: Family History  Problem Relation Age of Onset   Heart disease Mother    Stroke Father    Heart disease Brother    Kidney cancer Neg Hx    Prostate cancer Neg Hx    Bladder Cancer Neg Hx    Breast cancer Neg Hx     Social History   Socioeconomic History   Marital status: Widowed    Spouse name: Not on file   Number of children: Not on file   Years of education: Not on file   Highest education level: Not on file  Occupational History   Not on file  Tobacco Use   Smoking status: Never   Smokeless tobacco: Never  Vaping Use   Vaping Use: Never used  Substance and Sexual Activity   Alcohol use: No    Alcohol/week: 0.0 standard drinks of alcohol   Drug use: No   Sexual activity: Not on file  Other Topics Concern   Not on file  Social History Narrative   Not on file   Social Determinants of Health   Financial Resource Strain: Not on file  Food Insecurity: Not on file  Transportation Needs: Not on file  Physical Activity: Not on file  Stress: Not on file  Social Connections: Not on file  Intimate Partner Violence: Not on file      Review of Systems  Constitutional:  Positive for chills and fatigue.  HENT:  Positive for congestion, postnasal drip, sinus pressure and sore throat. Negative for rhinorrhea, sinus pain and sneezing.   Respiratory:  Positive for cough. Negative for chest tightness, shortness of breath and wheezing.   Cardiovascular: Negative.  Negative for chest pain and palpitations.  Neurological:  Positive for headaches.    Vital Signs: There were no vitals taken for this visit.   Observation/Objective: She is alert and oriented and engages in conversation appropriately. No acute distress noted    Assessment/Plan: 1. Acute non-recurrent maxillary sinusitis Empiric antibiotic tx  prescribed. - amoxicillin-clavulanate (AUGMENTIN) 875-125 MG tablet; Take 1 tablet by mouth 2 (two) times daily.  Dispense: 20 tablet; Refill: 0  2. Acute cough Medication prescribed for symptomatic relief of cough - chlorpheniramine-HYDROcodone (TUSSIONEX) 10-8 MG/5ML; Take 5 mLs by mouth every 12 (twelve) hours as needed for cough.  Dispense: 140 mL; Refill: 0   General Counseling: Chriselda verbalizes understanding of the findings of today's phone visit and agrees with plan of treatment. I have discussed any further diagnostic evaluation that may be needed or ordered today. We also reviewed her medications today. she has been encouraged to call the office with any questions or concerns that should arise related to todays visit.  Return if symptoms worsen or fail to improve.   No orders of the defined types were  placed in this encounter.   Meds ordered this encounter  Medications   amoxicillin-clavulanate (AUGMENTIN) 875-125 MG tablet    Sig: Take 1 tablet by mouth 2 (two) times daily.    Dispense:  20 tablet    Refill:  0   chlorpheniramine-HYDROcodone (TUSSIONEX) 10-8 MG/5ML    Sig: Take 5 mLs by mouth every 12 (twelve) hours as needed for cough.    Dispense:  140 mL    Refill:  0    Time spent:10 Minutes Time spent with patient included reviewing progress notes, labs, imaging studies, and discussing plan for follow up.  [place] Controlled Substance Database was reviewed by me for overdose risk score (ORS) if appropriate.  This patient was seen by Jonetta Osgood, FNP-C in collaboration with Dr. Clayborn Bigness as a part of collaborative care agreement.  Dawaun Brancato R. Valetta Fuller, MSN, FNP-C Internal medicine

## 2022-07-23 ENCOUNTER — Telehealth: Payer: Self-pay

## 2022-07-23 DIAGNOSIS — N289 Disorder of kidney and ureter, unspecified: Secondary | ICD-10-CM

## 2022-07-23 DIAGNOSIS — R300 Dysuria: Secondary | ICD-10-CM

## 2022-07-24 DIAGNOSIS — R300 Dysuria: Secondary | ICD-10-CM | POA: Diagnosis not present

## 2022-07-24 DIAGNOSIS — N289 Disorder of kidney and ureter, unspecified: Secondary | ICD-10-CM | POA: Diagnosis not present

## 2022-07-24 NOTE — Telephone Encounter (Signed)
Patient notified

## 2022-07-25 LAB — URINALYSIS, ROUTINE W REFLEX MICROSCOPIC
Bilirubin, UA: NEGATIVE
Glucose, UA: NEGATIVE
Ketones, UA: NEGATIVE
Leukocytes,UA: NEGATIVE
Nitrite, UA: NEGATIVE
RBC, UA: NEGATIVE
Specific Gravity, UA: 1.007 (ref 1.005–1.030)
Urobilinogen, Ur: 0.2 mg/dL (ref 0.2–1.0)
pH, UA: 6 (ref 5.0–7.5)

## 2022-07-25 LAB — CMP14+EGFR
ALT: 7 IU/L (ref 0–32)
AST: 13 IU/L (ref 0–40)
Albumin/Globulin Ratio: 2.1 (ref 1.2–2.2)
Albumin: 4.5 g/dL (ref 3.7–4.7)
Alkaline Phosphatase: 67 IU/L (ref 44–121)
BUN/Creatinine Ratio: 22 (ref 12–28)
BUN: 19 mg/dL (ref 8–27)
Bilirubin Total: 0.5 mg/dL (ref 0.0–1.2)
CO2: 21 mmol/L (ref 20–29)
Calcium: 9.8 mg/dL (ref 8.7–10.3)
Chloride: 101 mmol/L (ref 96–106)
Creatinine, Ser: 0.85 mg/dL (ref 0.57–1.00)
Globulin, Total: 2.1 g/dL (ref 1.5–4.5)
Glucose: 115 mg/dL — ABNORMAL HIGH (ref 70–99)
Potassium: 4.2 mmol/L (ref 3.5–5.2)
Sodium: 138 mmol/L (ref 134–144)
Total Protein: 6.6 g/dL (ref 6.0–8.5)
eGFR: 67 mL/min/{1.73_m2} (ref >59)

## 2022-07-30 ENCOUNTER — Telehealth: Payer: Self-pay | Admitting: Pharmacist

## 2022-07-30 NOTE — Telephone Encounter (Signed)
Oral Chemotherapy Pharmacist Encounter   Received notification from [facility] in scheduling that patient was requesting I called her to discuss the diarrhea she was having. Called Ms. [place] and she reported that her diarrhea started on last Thursday 07/26/22. Yesterday, she took about 4 capsules of loperamide to help manage her diarrhea.   Of note, she restarted her Pomalyst on 07/27/22, so after her diarrhea started. She recently completed an course of Augmentin which could be a cause of her diarrhea. She has been drink water to remain hydrated.   Today so far she has not had any diarrhea.   Telephone visit set-up for tomorrow to check back in with patient.    Darl Pikes, PharmD, BCPS, BCOP, CPP Hematology/Oncology Clinical Pharmacist [place]/DB/AP Oral [facility] [phone]  07/30/2022 1:52 PM

## 2022-07-31 ENCOUNTER — Inpatient Hospital Stay: Payer: Medicare PPO | Attending: Oncology | Admitting: Pharmacist

## 2022-07-31 ENCOUNTER — Inpatient Hospital Stay: Payer: Medicare PPO

## 2022-07-31 VITALS — BP 117/61 | HR 67 | Temp 97.2°F | Resp 20

## 2022-07-31 DIAGNOSIS — Z87440 Personal history of urinary (tract) infections: Secondary | ICD-10-CM | POA: Diagnosis not present

## 2022-07-31 DIAGNOSIS — C9000 Multiple myeloma not having achieved remission: Secondary | ICD-10-CM

## 2022-07-31 DIAGNOSIS — C9002 Multiple myeloma in relapse: Secondary | ICD-10-CM | POA: Insufficient documentation

## 2022-07-31 DIAGNOSIS — R197 Diarrhea, unspecified: Secondary | ICD-10-CM | POA: Diagnosis not present

## 2022-07-31 DIAGNOSIS — Z85828 Personal history of other malignant neoplasm of skin: Secondary | ICD-10-CM | POA: Insufficient documentation

## 2022-07-31 LAB — CBC WITH DIFFERENTIAL/PLATELET
Abs Immature Granulocytes: 0.01 10*3/uL (ref 0.00–0.07)
Basophils Absolute: 0 10*3/uL (ref 0.0–0.1)
Basophils Relative: 1 %
Eosinophils Absolute: 0.4 10*3/uL (ref 0.0–0.5)
Eosinophils Relative: 12 %
HCT: 38.2 % (ref 36.0–46.0)
Hemoglobin: 12.7 g/dL (ref 12.0–15.0)
Immature Granulocytes: 0 %
Lymphocytes Relative: 24 %
Lymphs Abs: 0.8 10*3/uL (ref 0.7–4.0)
MCH: 31.6 pg (ref 26.0–34.0)
MCHC: 33.2 g/dL (ref 30.0–36.0)
MCV: 95 fL (ref 80.0–100.0)
Monocytes Absolute: 0.9 10*3/uL (ref 0.1–1.0)
Monocytes Relative: 26 %
Neutro Abs: 1.3 10*3/uL — ABNORMAL LOW (ref 1.7–7.7)
Neutrophils Relative %: 37 %
Platelets: 191 10*3/uL (ref 150–400)
RBC: 4.02 MIL/uL (ref 3.87–5.11)
RDW: 13.3 % (ref 11.5–15.5)
WBC: 3.5 10*3/uL — ABNORMAL LOW (ref 4.0–10.5)
nRBC: 0 % (ref 0.0–0.2)

## 2022-07-31 LAB — COMPREHENSIVE METABOLIC PANEL
ALT: 60 U/L — ABNORMAL HIGH (ref 0–44)
AST: 33 U/L (ref 15–41)
Albumin: 3.9 g/dL (ref 3.5–5.0)
Alkaline Phosphatase: 54 U/L (ref 38–126)
Anion gap: 9 (ref 5–15)
BUN: 14 mg/dL (ref 8–23)
CO2: 25 mmol/L (ref 22–32)
Calcium: 8.7 mg/dL — ABNORMAL LOW (ref 8.9–10.3)
Chloride: 103 mmol/L (ref 98–111)
Creatinine, Ser: 0.88 mg/dL (ref 0.44–1.00)
GFR, Estimated: >60 mL/min (ref >60)
Glucose, Bld: 118 mg/dL — ABNORMAL HIGH (ref 70–99)
Potassium: 3.2 mmol/L — ABNORMAL LOW (ref 3.5–5.1)
Sodium: 137 mmol/L (ref 135–145)
Total Bilirubin: 0.8 mg/dL (ref 0.3–1.2)
Total Protein: 6.4 g/dL — ABNORMAL LOW (ref 6.5–8.1)

## 2022-07-31 LAB — MAGNESIUM: Magnesium: 2.1 mg/dL (ref 1.7–2.4)

## 2022-07-31 MED ORDER — SODIUM CHLORIDE 0.9 % IV SOLN
Freq: Once | INTRAVENOUS | Status: AC
  Filled 2022-07-31: qty 250

## 2022-07-31 MED ORDER — POTASSIUM CHLORIDE 10 MEQ/100ML IV SOLN
10.0000 meq | Freq: Once | INTRAVENOUS | Status: AC
  Administered 2022-07-31: 10 meq via INTRAVENOUS
  Filled 2022-07-31: qty 100

## 2022-07-31 MED ORDER — POTASSIUM CHLORIDE CRYS ER 20 MEQ PO TBCR: 20.0000 meq | EXTENDED_RELEASE_TABLET | Freq: Every day | ORAL | 0 refills | Status: DC

## 2022-07-31 MED ORDER — SODIUM CHLORIDE 0.9 % IV SOLN: 10.0000 meq | Freq: Once | INTRAVENOUS | Status: DC

## 2022-07-31 NOTE — Progress Notes (Signed)
[facility] [place]  Telephone:([phone] Fax:(336) [phone]    I connected withNAME@ on 07/31/22 at  1:00 PM EST by telephone and verified that I am speaking with the correct person using two identifiers.  Name of the patient: Rhonda [name]  [id number]  Dec 26, 1933   Location: Patient: at home Provider: in office   I discussed the limitations, risks, security and privacy concerns of performing an evaluation and management service by telephone and the availability of in person appointments. I also discussed with the patient that there may be a patient responsible charge related to this service. The patient expressed understanding and agreed to proceed.  HPI: Patient is a 89 y.o. female with multiple myeloma. Currently on treatment with pomalidomide. Yesterday patient reported diarrhea started on last Thursday 07/26/22. Over the weekend, she had days where she was having 4 loose stools per day despite the use of loperamide. Over the last 24 hours patient has had 2 loose stools while using loperamide.   Of note, she restarted her Pomalyst on 07/27/22, so after her diarrhea started. She recently completed an course of Augmentin which could be a cause of her diarrhea   Reason for Consult: Diarrhea   PAST MEDICAL HISTORY: Past Medical History:  Diagnosis Date   Actinic keratosis    Arthritis    hands   Benign neoplasm of ascending colon    Cataract    GERD (gastroesophageal reflux disease)    Hyperlipemia    Hypertension 01/31/2015   Hypothyroidism    Melanoma ([place]) 04/28/2018   R mid to distal ant lat thigh. MM, SS, tumor thickness 0.69m, antatomic level II. Excised: 05/13/18, margins free   [place] 04[place]   [place] 04/18[place]   Osteoporosis    Shingles    Skin cancer    basal cell   Squamous cell carcinoma of skin 02/04/2014   Left pretibial. KA-like pattern   Squamous cell  carcinoma of skin 09/15/2015   Right lat. superior ankle are. Superficial infiltration. Tx: EDC   Squamous cell carcinoma of skin 10/15/2016   Left medial mid lower leg. Tx: EDC   Squamous cell carcinoma of skin 11/27/2018   Right dorsum proximal forearm.   Squamous cell carcinoma of skin 09/09/2019   Right cheek lat. to mid nasolabial area. Tx: EDC   Squamous cell carcinoma of skin 12/09/2019   right pretibial below knee   Squamous cell carcinoma of skin 06/25/2022   Left ant forearm - EDC    HEMATOLOGY/ONCOLOGY HISTORY:  Oncology History  Multiple myeloma ([facility]  04/18/2016 Initial Diagnosis   Multiple myeloma (HCC)     ALLERGIES:  is allergic to revlimid [lenalidomide].  MEDICATIONS:  Current Outpatient Medications  Medication Sig Dispense Refill   amoxicillin-clavulanate (AUGMENTIN) 875-125 MG tablet Take 1 tablet by mouth 2 (two) times daily. 20 tablet 0   aspirin EC 81 MG tablet Take 81 mg by mouth daily.     chlorpheniramine-HYDROcodone (TUSSIONEX) 10-8 MG/5ML Take 5 mLs by mouth every 12 (twelve) hours as needed for cough. 140 mL 0   Cyanocobalamin (B-12 PO) Take 500 mcg by mouth daily.      EPINEPHrine 0.3 mg/0.3 mL IJ SOAJ injection      fexofenadine (ALLEGRA) 180 MG tablet Take 180 mg by mouth daily.     levothyroxine (SYNTHROID) 50 MCG tablet TAKE 1 TABLET BY MOUTH DAILY BEFORE BREAKFAST 90 tablet 3   Multiple Vitamins-Minerals (CENTRUM SILVER PO) Take  1 tablet by mouth daily.     POMALYST 2 MG capsule TAKE 1 CAPSULE (2 MG TOTAL) BY MOUTH DAILY FOR 21 DAYS, THEN HOLD FOR 7 DAYS. REPEAT EVERY 28 DAYS 21 capsule 0   triamcinolone (NASACORT) 55 MCG/ACT AERO nasal inhaler Place 2 sprays into the nose 2 (two) times daily as needed.     UNABLE TO FIND Inject 1 Dose as directed once a week. Allergy injections once a week     No current facility-administered medications for this visit.   Facility-Administered Medications Ordered in Other Visits  Medication Dose Route  Frequency Provider Last Rate Last Admin   0.9 %  sodium chloride infusion   Intravenous Continuous Lloyd Huger, MD 10 mL/hr at 06/07/22 1358 New Bag at 06/07/22 1358    VITAL SIGNS: There were no vitals taken for this visit. There were no vitals filed for this visit.  Estimated body mass index is 23.78 kg/m as calculated from the following:   Height as of 06/13/22: 5' 2"$  (1.575 m).   Weight as of 06/13/22: 59 kg (130 lb).  LABS: CBC:    Component Value Date/Time   WBC 4.7 05/30/2022 1246   HGB 13.0 05/30/2022 1246   HCT 38.2 05/30/2022 1246   PLT 137 (L) 05/30/2022 1246   MCV 95.3 05/30/2022 1246   NEUTROABS 2.6 05/30/2022 1246   LYMPHSABS 1.1 05/30/2022 1246   MONOABS 0.8 05/30/2022 1246   EOSABS 0.1 05/30/2022 1246   BASOSABS 0.1 05/30/2022 1246   Comprehensive Metabolic Panel:    Component Value Date/Time   NA 138 07/24/2022 1044   K 4.2 07/24/2022 1044   CL 101 07/24/2022 1044   CO2 21 07/24/2022 1044   BUN 19 07/24/2022 1044   CREATININE 0.85 07/24/2022 1044   GLUCOSE 115 (H) 07/24/2022 1044   GLUCOSE 119 (H) 05/30/2022 1246   CALCIUM 9.8 07/24/2022 1044   AST 13 07/24/2022 1044   ALT 7 07/24/2022 1044   ALKPHOS 67 07/24/2022 1044   BILITOT 0.5 07/24/2022 1044   PROT 6.6 07/24/2022 1044   ALBUMIN 4.5 07/24/2022 1044    On phone during today's visit:  patient   Assessment and Plan-  Diarrhea likely from recent antibiotic course. Loose stools per day have decreased over the last few days. Continue use of loperamide as needed Patient scheduled to come into clinic today for labs and IV fluid. She reports drink water at home and having a bit trouble keeping up with hydration.   Patient expressed understanding and was in agreement with this plan. She also understands that She can call clinic at any time with any questions, concerns, or complaints.   Follow-up plan: clinic today for labs/IV fluids  Thank you for allowing me to participate in the care of  this very pleasant patient.   Time Total: 10 mins of non face-to-face telephone visit time during this encounter  Visit consisted of counseling and education on dealing with issues of symptom management in the setting of serious and potentially life-threatening illness.Greater than 50%  of this time was spent counseling and coordinating care related to the above assessment and plan.   Darl Pikes, PharmD, BCPS, BCOP, CPP Hematology/Oncology Clinical Pharmacist Practitioner [place]/DB/AP Oral [facility] [phone]  07/31/2022 1:15 PM

## 2022-08-01 ENCOUNTER — Telehealth: Payer: Self-pay | Admitting: Pharmacist

## 2022-08-01 LAB — IGG, IGA, IGM
IgA: 85 mg/dL (ref 64–422)
IgG (Immunoglobin G), Serum: 649 mg/dL (ref 586–1602)
IgM (Immunoglobulin M), Srm: 21 mg/dL — ABNORMAL LOW (ref 26–217)

## 2022-08-01 LAB — KAPPA/LAMBDA LIGHT CHAINS
Kappa free light chain: 22.2 mg/L — ABNORMAL HIGH (ref 3.3–19.4)
Kappa, lambda light chain ratio: 0.06 — ABNORMAL LOW (ref 0.26–1.65)
Lambda free light chains: 383.1 mg/L — ABNORMAL HIGH (ref 5.7–26.3)

## 2022-08-01 NOTE — Telephone Encounter (Signed)
Oral Chemotherapy Pharmacist Encounter   Called today to check in with patient. She is feeling much better since receiving IV fluids yesterday. He has more energy and her last incidence of diarrhea was in the morning of 07/31/22 priro to coming in for fluids.   Patient knows to call if she has further problems, but for how she will RTC as planned on 08/13/22.   Darl Pikes, PharmD, BCPS, BCOP, CPP Hematology/Oncology Clinical Pharmacist [place]/DB/AP Oral [facility] [phone]  08/01/2022 4:34 PM

## 2022-08-03 DIAGNOSIS — J301 Allergic rhinitis due to pollen: Secondary | ICD-10-CM | POA: Diagnosis not present

## 2022-08-05 ENCOUNTER — Other Ambulatory Visit: Payer: Self-pay | Admitting: Oncology

## 2022-08-05 DIAGNOSIS — C9000 Multiple myeloma not having achieved remission: Secondary | ICD-10-CM

## 2022-08-06 LAB — IMMUNOFIXATION ELECTROPHORESIS
IgA: 79 mg/dL (ref 64–422)
IgG (Immunoglobin G), Serum: 645 mg/dL (ref 586–1602)
IgM (Immunoglobulin M), Srm: 26 mg/dL (ref 26–217)
Total Protein ELP: 6 g/dL (ref 6.0–8.5)

## 2022-08-06 NOTE — Telephone Encounter (Signed)
Is this ok to refill?  

## 2022-08-10 DIAGNOSIS — J301 Allergic rhinitis due to pollen: Secondary | ICD-10-CM | POA: Diagnosis not present

## 2022-08-13 ENCOUNTER — Encounter: Payer: Self-pay | Admitting: Oncology

## 2022-08-13 ENCOUNTER — Inpatient Hospital Stay (HOSPITAL_BASED_OUTPATIENT_CLINIC_OR_DEPARTMENT_OTHER): Payer: Medicare PPO | Admitting: Oncology

## 2022-08-13 ENCOUNTER — Inpatient Hospital Stay: Payer: Medicare PPO | Attending: Oncology

## 2022-08-13 ENCOUNTER — Inpatient Hospital Stay: Payer: Medicare PPO | Admitting: Pharmacist

## 2022-08-13 VITALS — BP 105/75 | HR 70 | Temp 96.0°F | Wt 124.7 lb

## 2022-08-13 DIAGNOSIS — Z87440 Personal history of urinary (tract) infections: Secondary | ICD-10-CM | POA: Diagnosis not present

## 2022-08-13 DIAGNOSIS — Z85828 Personal history of other malignant neoplasm of skin: Secondary | ICD-10-CM | POA: Diagnosis not present

## 2022-08-13 DIAGNOSIS — G629 Polyneuropathy, unspecified: Secondary | ICD-10-CM | POA: Insufficient documentation

## 2022-08-13 DIAGNOSIS — C9002 Multiple myeloma in relapse: Secondary | ICD-10-CM | POA: Diagnosis not present

## 2022-08-13 DIAGNOSIS — R197 Diarrhea, unspecified: Secondary | ICD-10-CM | POA: Insufficient documentation

## 2022-08-13 DIAGNOSIS — C9000 Multiple myeloma not having achieved remission: Secondary | ICD-10-CM

## 2022-08-13 LAB — CBC WITH DIFFERENTIAL/PLATELET
Abs Immature Granulocytes: 0.03 10*3/uL (ref 0.00–0.07)
Basophils Absolute: 0.1 10*3/uL (ref 0.0–0.1)
Basophils Relative: 2 %
Eosinophils Absolute: 0.2 10*3/uL (ref 0.0–0.5)
Eosinophils Relative: 5 %
HCT: 40.1 % (ref 36.0–46.0)
Hemoglobin: 13 g/dL (ref 12.0–15.0)
Immature Granulocytes: 1 %
Lymphocytes Relative: 19 %
Lymphs Abs: 1 10*3/uL (ref 0.7–4.0)
MCH: 31.9 pg (ref 26.0–34.0)
MCHC: 32.4 g/dL (ref 30.0–36.0)
MCV: 98.5 fL (ref 80.0–100.0)
Monocytes Absolute: 1 10*3/uL (ref 0.1–1.0)
Monocytes Relative: 18 %
Neutro Abs: 3 10*3/uL (ref 1.7–7.7)
Neutrophils Relative %: 55 %
Platelets: 177 10*3/uL (ref 150–400)
RBC: 4.07 MIL/uL (ref 3.87–5.11)
RDW: 13.9 % (ref 11.5–15.5)
WBC: 5.3 10*3/uL (ref 4.0–10.5)
nRBC: 0 % (ref 0.0–0.2)

## 2022-08-13 LAB — BASIC METABOLIC PANEL
Anion gap: 6 (ref 5–15)
BUN: 18 mg/dL (ref 8–23)
CO2: 24 mmol/L (ref 22–32)
Calcium: 8.7 mg/dL — ABNORMAL LOW (ref 8.9–10.3)
Chloride: 107 mmol/L (ref 98–111)
Creatinine, Ser: 0.86 mg/dL (ref 0.44–1.00)
GFR, Estimated: >60 mL/min (ref >60)
Glucose, Bld: 111 mg/dL — ABNORMAL HIGH (ref 70–99)
Potassium: 4 mmol/L (ref 3.5–5.1)
Sodium: 137 mmol/L (ref 135–145)

## 2022-08-13 NOTE — Progress Notes (Signed)
[facility]  Telephone:(336) [phone] Fax:(336) [phone]  ID: Rhonda [name] OB: 04/27/1934  MR#: [id number]  [id number]  Patient Care Team: Jonetta Osgood, NP as PCP - General (Nurse Practitioner) Lloyd Huger, MD as Consulting Physician (Oncology)   CHIEF COMPLAINT:  Multiple myeloma in relapse.  INTERVAL HISTORY: Patient returns to clinic as an add-on for repeat laboratory and further evaluation.  She was recently placed on antibiotics for a sinus infection and subsequently had increased diarrhea and dehydration.  She currently feels well and is back to her baseline.  She has a chronic neuropathy, but no other neurologic complaints.  She denies any fevers. She has a good appetite and denies weight loss.  She denies any chest [name], shortness of breath, cough, or hemoptysis.  She denies any nausea, vomiting, constipation, or diarrhea.  She has no melena or hematochezia.  Patient offers no further specific complaints today.  REVIEW OF SYSTEMS:   Review of Systems  Constitutional: Negative.  Negative for fever, malaise/fatigue and weight loss.  HENT:  Negative for congestion and sore throat.   Respiratory: Negative.  Negative for cough and shortness of breath.   Cardiovascular: Negative.  Negative for chest [name], palpitations and leg swelling.  Gastrointestinal: Negative.  Negative for abdominal [name], blood in stool, constipation, diarrhea, melena, nausea and vomiting.  Genitourinary: Negative.  Negative for dysuria.  Musculoskeletal: Negative.  Negative for back [name].  Skin: Negative.  Negative for rash.  Neurological:  Positive for tingling and sensory change. Negative for focal weakness and weakness.  Psychiatric/Behavioral: Negative.  The patient is not nervous/anxious and does not have insomnia.     As per HPI. Otherwise, a complete review of systems is negative.  PAST MEDICAL HISTORY: Past Medical History:  Diagnosis Date   Actinic keratosis     Arthritis    hands   Benign neoplasm of ascending colon    Cataract    GERD (gastroesophageal reflux disease)    Hyperlipemia    Hypertension 01/31/2015   Hypothyroidism    Melanoma ([place]) 04/28/2018   R mid to distal ant lat thigh. MM, SS, tumor thickness 0.33m, antatomic level II. Excised: 05/13/18, margins free   Multiple myeloma ([facility] 04/18/2016   Multiple myeloma in remission ([facility] 04/18/2016   Osteoporosis    Shingles    Skin cancer    basal cell   Squamous cell carcinoma of skin 02/04/2014   Left pretibial. KA-like pattern   Squamous cell carcinoma of skin 09/15/2015   Right lat. superior ankle are. Superficial infiltration. Tx: EDC   Squamous cell carcinoma of skin 10/15/2016   Left medial mid lower leg. Tx: EDC   Squamous cell carcinoma of skin 11/27/2018   Right dorsum proximal forearm.   Squamous cell carcinoma of skin 09/09/2019   Right cheek lat. to mid nasolabial area. Tx: EDC   Squamous cell carcinoma of skin 12/09/2019   right pretibial below knee   Squamous cell carcinoma of skin 06/25/2022   Left ant forearm - EDC    PAST SURGICAL HISTORY: Past Surgical History:  Procedure Laterality Date   BREAST EXCISIONAL BIOPSY Right 15+ yrs ago   EXCISIONAL - NEG   CATARACT EXTRACTION W/ INTRAOCULAR LENS IMPLANT     CHOLECYSTECTOMY N/A 03/14/2017   Procedure: LAPAROSCOPIC CHOLECYSTECTOMY;  Surgeon: CFlorene Glen MD;  Location: [facility];  Service: General;  Laterality: N/A;   COLONOSCOPY WITH PROPOFOL N/A 02/21/2015   Procedure: COLONOSCOPY WITH PROPOFOL;  Surgeon: DLucilla Lame MD;  Location:  [facility];  Service: Endoscopy;  Laterality: N/A;   LAPAROSCOPIC HYSTERECTOMY  1980   POLYPECTOMY  02/21/2015   Procedure: POLYPECTOMY;  Surgeon: Lucilla Lame, MD;  Location: [facility];  Service: Endoscopy;;    FAMILY HISTORY: Family History  Problem Relation Age of Onset   Heart disease Mother    Stroke Father    Heart disease Brother    Kidney  cancer Neg Hx    Prostate cancer Neg Hx    Bladder Cancer Neg Hx    Breast cancer Neg Hx     ADVANCED DIRECTIVES (Y/N):  N  HEALTH MAINTENANCE: Social History   Tobacco Use   Smoking status: Never   Smokeless tobacco: Never  Vaping Use   Vaping Use: Never used  Substance Use Topics   Alcohol use: No    Alcohol/week: 0.0 standard drinks of alcohol   Drug use: No     Colonoscopy:  PAP:  Bone density:  Lipid panel:  Allergies  Allergen Reactions   Revlimid [Lenalidomide]     Current Outpatient Medications  Medication Sig Dispense Refill   aspirin EC 81 MG tablet Take 81 mg by mouth daily.     Cyanocobalamin (B-12 PO) Take 500 mcg by mouth daily.      EPINEPHrine 0.3 mg/0.3 mL IJ SOAJ injection      fexofenadine (ALLEGRA) 180 MG tablet Take 180 mg by mouth daily.     levothyroxine (SYNTHROID) 50 MCG tablet TAKE 1 TABLET BY MOUTH DAILY BEFORE BREAKFAST 90 tablet 3   Multiple Vitamins-Minerals (CENTRUM SILVER PO) Take 1 tablet by mouth daily.     POMALYST 2 MG capsule TAKE 1 CAPSULE BY MOUTH EVERY DAY FOR 21 DAYS ON THEN 7 DAYS OFF. REPEAT EVERY 28 DAYS 21 capsule 0   triamcinolone (NASACORT) 55 MCG/ACT AERO nasal inhaler Place 2 sprays into the nose 2 (two) times daily as needed.     chlorpheniramine-HYDROcodone (TUSSIONEX) 10-8 MG/5ML Take 5 mLs by mouth every 12 (twelve) hours as needed for cough. (Patient not taking: Reported on 08/13/2022) 140 mL 0   potassium chloride SA (KLOR-CON M) 20 MEQ tablet Take 1 tablet (20 mEq total) by mouth daily. (Patient not taking: Reported on 08/13/2022) 6 tablet 0   No current facility-administered medications for this visit.   Facility-Administered Medications Ordered in Other Visits  Medication Dose Route Frequency Provider Last Rate Last Admin   0.9 %  sodium chloride infusion   Intravenous Continuous Lloyd Huger, MD 10 mL/hr at 06/07/22 1358 New Bag at 06/07/22 1358    OBJECTIVE: Vitals:   08/13/22 1052  BP: 105/75   Pulse: 70  Temp: (!) 96 F (35.6 C)  SpO2: 98%     Body mass index is 22.81 kg/m.    ECOG FS:0 - Asymptomatic  General: Well-developed, well-nourished, no acute distress. Eyes: Pink conjunctiva, anicteric sclera. HEENT: Normocephalic, moist mucous membranes. Lungs: No audible wheezing or coughing. Heart: Regular rate and rhythm. Abdomen: Soft, nontender, no obvious distention. Musculoskeletal: No edema, cyanosis, or clubbing. Neuro: Alert, answering all questions appropriately. Cranial nerves grossly intact. Skin: No rashes or petechiae noted. Psych: Normal affect.  LAB RESULTS:  Lab Results  Component Value Date   NA 137 08/13/2022   K 4.0 08/13/2022   CL 107 08/13/2022   CO2 24 08/13/2022   GLUCOSE 111 (H) 08/13/2022   BUN 18 08/13/2022   CREATININE 0.86 08/13/2022   CALCIUM 8.7 (L) 08/13/2022   PROT 6.4 (L) 07/31/2022  ALBUMIN 3.9 07/31/2022   AST 33 07/31/2022   ALT 60 (H) 07/31/2022   ALKPHOS 54 07/31/2022   BILITOT 0.8 07/31/2022   GFRNONAA >60 08/13/2022   GFRAA 40 (L) 03/01/2020    Lab Results  Component Value Date   WBC 5.3 08/13/2022   NEUTROABS 3.0 08/13/2022   HGB 13.0 08/13/2022   HCT 40.1 08/13/2022   MCV 98.5 08/13/2022   PLT 177 08/13/2022   Lab Results  Component Value Date   TOTALPROTELP 6.0 07/31/2022   ALBUMINELP 3.5 05/16/2021   A1GS 0.2 05/16/2021   A2GS 0.6 05/16/2021   BETS 0.8 05/16/2021   GAMS 0.5 05/16/2021   MSPIKE Not Observed 05/16/2021   SPEI Comment 05/16/2021     STUDIES: No results found.  ASSESSMENT:  Multiple myeloma in relapse.  PLAN:    Multiple myeloma in relapse: Bone marrow biopsy on April 09, 2016 revealed 44% plasma cells and normal cytogenetics. Given the results of her metastatic bone survey on March 12, 2016, her elevated lambda free chains, and hypercalcemia, patient fit the criteria for multiple myeloma and was initially treated with single agent Revlimid.  She could not tolerate Revlimid and  was switched to pomalidomide 2 mg daily for 21 days with a 7-day break.  Metastatic bone survey on February 12, 2019 revealed new small lesions in her calvarium.  Patient's most recent M spike is now 0.0 and her immunoglobulins remain decreased.  Lambda chains continue to trend down and are now 383.1. Patient completed 1 year of monthly treatment of Zometa, now only receives treatment every 3 months.  Continue pomalidomide as prescribed.  Return to clinic at the end of March as previously scheduled for further evaluation and continuation of treatment.   Chronic renal insufficiency: Resolved.   Peripheral neuropathy: Chronic and unchanged.  Patient was previously given a referral to acupuncture which she said did not help much.  Hypocalcemia: Mild.  Patient previously had hypercalcemia.  Continue Zometa as scheduled.   Recurrent UTIs: Patient is currently asymptomatic.  Continue monitoring and treatment per primary care. Thrombocytopenia: Resolved.  Patient expressed understanding and was in agreement with this plan. She also understands that She can call clinic at any time with any questions, concerns, or complaints.    Lloyd Huger, MD 08/13/22 11:35 AM

## 2022-08-13 NOTE — Progress Notes (Signed)
[facility]  [phone] Fax:([phone]  Patient Care Team: Jonetta Osgood, NP as PCP - General (Nurse Practitioner) Lloyd Huger, MD as Consulting Physician (Oncology)   Name of the patient: Rhonda [name]  [id number]  Sep 16, 1933   Date of visit: 08/13/22  HPI: Patient is a 89 y.o. female with multiple myeloma. Currently on treatment with pomalidomide.   Reason for Consult: Oral chemotherapy follow-up for pomalidomide therapy.   PAST MEDICAL HISTORY: Past Medical History:  Diagnosis Date   Actinic keratosis    Arthritis    hands   Benign neoplasm of ascending colon    Cataract    GERD (gastroesophageal reflux disease)    Hyperlipemia    Hypertension 01/31/2015   Hypothyroidism    Melanoma ([place]) 04/28/2018   R mid to distal ant lat thigh. MM, SS, tumor thickness 0.53m, antatomic level II. Excised: 05/13/18, margins free   Multiple myeloma ([place] 04/18/2016   [place]oma in remission ([facility] 04/18/2016   Osteoporosis    Shingles    Skin cancer    basal cell   Squamous cell carcinoma of skin 02/04/2014   Left pretibial. KA-like pattern   Squamous cell carcinoma of skin 09/15/2015   Right lat. superior ankle are. Superficial infiltration. Tx: EDC   Squamous cell carcinoma of skin 10/15/2016   Left medial mid lower leg. Tx: EDC   Squamous cell carcinoma of skin 11/27/2018   Right dorsum proximal forearm.   Squamous cell carcinoma of skin 09/09/2019   Right cheek lat. to mid nasolabial area. Tx: EDC   Squamous cell carcinoma of skin 12/09/2019   right pretibial below knee   Squamous cell carcinoma of skin 06/25/2022   Left ant forearm - EDC    HEMATOLOGY/ONCOLOGY HISTORY:  Oncology History  [place]  04/18/2016 Initial Diagnosis   Multiple myeloma (HCC)     ALLERGIES:  is allergic to revlimid [lenalidomide].  MEDICATIONS:  Current Outpatient Medications  Medication Sig  Dispense Refill   aspirin EC 81 MG tablet Take 81 mg by mouth daily.     chlorpheniramine-HYDROcodone (TUSSIONEX) 10-8 MG/5ML Take 5 mLs by mouth every 12 (twelve) hours as needed for cough. (Patient not taking: Reported on 08/13/2022) 140 mL 0   Cyanocobalamin (B-12 PO) Take 500 mcg by mouth daily.      EPINEPHrine 0.3 mg/0.3 mL IJ SOAJ injection      fexofenadine (ALLEGRA) 180 MG tablet Take 180 mg by mouth daily.     levothyroxine (SYNTHROID) 50 MCG tablet TAKE 1 TABLET BY MOUTH DAILY BEFORE BREAKFAST 90 tablet 3   Multiple Vitamins-Minerals (CENTRUM SILVER PO) Take 1 tablet by mouth daily.     POMALYST 2 MG capsule TAKE 1 CAPSULE BY MOUTH EVERY DAY FOR 21 DAYS ON THEN 7 DAYS OFF. REPEAT EVERY 28 DAYS 21 capsule 0   potassium chloride SA (KLOR-CON M) 20 MEQ tablet Take 1 tablet (20 mEq total) by mouth daily. (Patient not taking: Reported on 08/13/2022) 6 tablet 0   triamcinolone (NASACORT) 55 MCG/ACT AERO nasal inhaler Place 2 sprays into the nose 2 (two) times daily as needed.     No current facility-administered medications for this visit.   Facility-Administered Medications Ordered in Other Visits  Medication Dose Route Frequency Provider Last Rate Last Admin   0.9 %  sodium chloride infusion   Intravenous Continuous FLloyd Huger MD 10 mL/hr at 06/07/22 1358 New Bag at 06/07/22 1358    VITAL SIGNS: There  were no vitals taken for this visit. There were no vitals filed for this visit.  Estimated body mass index is 22.81 kg/m as calculated from the following:   Height as of 06/13/22: '5\' 2"'$  (1.575 m).   Weight as of an earlier encounter on 08/13/22: 56.6 kg (124 lb 11.2 oz).  LABS: CBC:    Component Value Date/Time   WBC 5.3 08/13/2022 1006   HGB 13.0 08/13/2022 1006   HCT 40.1 08/13/2022 1006   PLT 177 08/13/2022 1006   MCV 98.5 08/13/2022 1006   NEUTROABS 3.0 08/13/2022 1006   LYMPHSABS 1.0 08/13/2022 1006   MONOABS 1.0 08/13/2022 1006   EOSABS 0.2 08/13/2022 1006    BASOSABS 0.1 08/13/2022 1006   Comprehensive Metabolic Panel:    Component Value Date/Time   NA 137 08/13/2022 1006   NA 138 07/24/2022 1044   K 4.0 08/13/2022 1006   CL 107 08/13/2022 1006   CO2 24 08/13/2022 1006   BUN 18 08/13/2022 1006   BUN 19 07/24/2022 1044   CREATININE 0.86 08/13/2022 1006   GLUCOSE 111 (H) 08/13/2022 1006   CALCIUM 8.7 (L) 08/13/2022 1006   AST 33 07/31/2022 1414   ALT 60 (H) 07/31/2022 1414   ALKPHOS 54 07/31/2022 1414   BILITOT 0.8 07/31/2022 1414   BILITOT 0.5 07/24/2022 1044   PROT 6.4 (L) 07/31/2022 1414   PROT 6.6 07/24/2022 1044   ALBUMIN 3.9 07/31/2022 1414   ALBUMIN 4.5 07/24/2022 1044     Present during today's visit: patient only  Assessment and Plan: Reviewed CBC/CMP, continue pomalidomide '2mg'$  21on/7off She has had no more diarrhea since receiving IV fluids in clinic two weeks ago   Oral Chemotherapy Side Effect/Intolerance:  No reported diarrhea, fatigue and appetite have improved.  Oral Chemotherapy Adherence: no missed doses reported No patient barriers to medication adherence identified.   New medications: none reported  Medication Access Issues: No issues, patient fills at [facility]  Patient expressed understanding and was in agreement with this plan. She also understands that She can call clinic at any time with any questions, concerns, or complaints.   Follow-up plan: RTC at then end of the month  Thank you for allowing me to participate in the care of this very pleasant patient.   Time Total: 15 mins  Visit consisted of counseling and education on dealing with issues of symptom management in the setting of serious and potentially life-threatening illness.Greater than 50%  of this time was spent counseling and coordinating care related to the above assessment and plan.  Signed by: Darl Pikes, PharmD, BCPS, Salley Slaughter, CPP Hematology/Oncology Clinical Pharmacist Practitioner [place]/DB/AP Oral  [facility] [phone]  08/13/2022 11:22 AM

## 2022-08-14 LAB — KAPPA/LAMBDA LIGHT CHAINS
Kappa free light chain: 33 mg/L — ABNORMAL HIGH (ref 3.3–19.4)
Kappa, lambda light chain ratio: 0.09 — ABNORMAL LOW (ref 0.26–1.65)
Lambda free light chains: 385.3 mg/L — ABNORMAL HIGH (ref 5.7–26.3)

## 2022-08-14 LAB — IGG, IGA, IGM
IgA: 91 mg/dL (ref 64–422)
IgG (Immunoglobin G), Serum: 613 mg/dL (ref 586–1602)
IgM (Immunoglobulin M), Srm: 28 mg/dL (ref 26–217)

## 2022-08-17 DIAGNOSIS — J301 Allergic rhinitis due to pollen: Secondary | ICD-10-CM | POA: Diagnosis not present

## 2022-08-20 LAB — IMMUNOFIXATION ELECTROPHORESIS
IgA: 98 mg/dL (ref 64–422)
IgG (Immunoglobin G), Serum: 614 mg/dL (ref 586–1602)
IgM (Immunoglobulin M), Srm: 26 mg/dL (ref 26–217)
Total Protein ELP: 5.9 g/dL — ABNORMAL LOW (ref 6.0–8.5)

## 2022-08-30 ENCOUNTER — Inpatient Hospital Stay: Payer: Medicare PPO

## 2022-08-30 DIAGNOSIS — G629 Polyneuropathy, unspecified: Secondary | ICD-10-CM | POA: Diagnosis not present

## 2022-08-30 DIAGNOSIS — Z87440 Personal history of urinary (tract) infections: Secondary | ICD-10-CM | POA: Diagnosis not present

## 2022-08-30 DIAGNOSIS — C9000 Multiple myeloma not having achieved remission: Secondary | ICD-10-CM

## 2022-08-30 DIAGNOSIS — R197 Diarrhea, unspecified: Secondary | ICD-10-CM | POA: Diagnosis not present

## 2022-08-30 DIAGNOSIS — C9002 Multiple myeloma in relapse: Secondary | ICD-10-CM | POA: Diagnosis not present

## 2022-08-30 DIAGNOSIS — Z85828 Personal history of other malignant neoplasm of skin: Secondary | ICD-10-CM | POA: Diagnosis not present

## 2022-08-30 LAB — CBC WITH DIFFERENTIAL/PLATELET
Abs Immature Granulocytes: 0.03 10*3/uL (ref 0.00–0.07)
Basophils Absolute: 0.1 10*3/uL (ref 0.0–0.1)
Basophils Relative: 2 %
Eosinophils Absolute: 0.1 10*3/uL (ref 0.0–0.5)
Eosinophils Relative: 3 %
HCT: 36.8 % (ref 36.0–46.0)
Hemoglobin: 12.5 g/dL (ref 12.0–15.0)
Immature Granulocytes: 1 %
Lymphocytes Relative: 36 %
Lymphs Abs: 1.2 10*3/uL (ref 0.7–4.0)
MCH: 32.4 pg (ref 26.0–34.0)
MCHC: 34 g/dL (ref 30.0–36.0)
MCV: 95.3 fL (ref 80.0–100.0)
Monocytes Absolute: 0.5 10*3/uL (ref 0.1–1.0)
Monocytes Relative: 17 %
Neutro Abs: 1.4 10*3/uL — ABNORMAL LOW (ref 1.7–7.7)
Neutrophils Relative %: 41 %
Platelets: 210 10*3/uL (ref 150–400)
RBC: 3.86 MIL/uL — ABNORMAL LOW (ref 3.87–5.11)
RDW: 14.2 % (ref 11.5–15.5)
WBC: 3.3 10*3/uL — ABNORMAL LOW (ref 4.0–10.5)
nRBC: 0 % (ref 0.0–0.2)

## 2022-08-30 LAB — COMPREHENSIVE METABOLIC PANEL
ALT: 31 U/L (ref 0–44)
AST: 22 U/L (ref 15–41)
Albumin: 3.7 g/dL (ref 3.5–5.0)
Alkaline Phosphatase: 55 U/L (ref 38–126)
Anion gap: 5 (ref 5–15)
BUN: 12 mg/dL (ref 8–23)
CO2: 25 mmol/L (ref 22–32)
Calcium: 8.4 mg/dL — ABNORMAL LOW (ref 8.9–10.3)
Chloride: 108 mmol/L (ref 98–111)
Creatinine, Ser: 0.78 mg/dL (ref 0.44–1.00)
GFR, Estimated: >60 mL/min (ref >60)
Glucose, Bld: 94 mg/dL (ref 70–99)
Potassium: 3.4 mmol/L — ABNORMAL LOW (ref 3.5–5.1)
Sodium: 138 mmol/L (ref 135–145)
Total Bilirubin: 0.4 mg/dL (ref 0.3–1.2)
Total Protein: 6.1 g/dL — ABNORMAL LOW (ref 6.5–8.1)

## 2022-08-31 DIAGNOSIS — J301 Allergic rhinitis due to pollen: Secondary | ICD-10-CM | POA: Diagnosis not present

## 2022-08-31 LAB — IGG, IGA, IGM
IgA: 106 mg/dL (ref 64–422)
IgG (Immunoglobin G), Serum: 601 mg/dL (ref 586–1602)
IgM (Immunoglobulin M), Srm: 19 mg/dL — ABNORMAL LOW (ref 26–217)

## 2022-08-31 LAB — KAPPA/LAMBDA LIGHT CHAINS
Kappa free light chain: 36.8 mg/L — ABNORMAL HIGH (ref 3.3–19.4)
Kappa, lambda light chain ratio: 0.09 — ABNORMAL LOW (ref 0.26–1.65)
Lambda free light chains: 392.7 mg/L — ABNORMAL HIGH (ref 5.7–26.3)

## 2022-09-02 ENCOUNTER — Other Ambulatory Visit: Payer: Self-pay | Admitting: Oncology

## 2022-09-02 DIAGNOSIS — C9000 Multiple myeloma not having achieved remission: Secondary | ICD-10-CM

## 2022-09-03 ENCOUNTER — Encounter: Payer: Self-pay | Admitting: Oncology

## 2022-09-04 LAB — IMMUNOFIXATION ELECTROPHORESIS
IgA: 111 mg/dL (ref 64–422)
IgG (Immunoglobin G), Serum: 600 mg/dL (ref 586–1602)
IgM (Immunoglobulin M), Srm: 18 mg/dL — ABNORMAL LOW (ref 26–217)
Total Protein ELP: 5.7 g/dL — ABNORMAL LOW (ref 6.0–8.5)

## 2022-09-06 ENCOUNTER — Encounter: Payer: Self-pay | Admitting: Oncology

## 2022-09-06 ENCOUNTER — Inpatient Hospital Stay (HOSPITAL_BASED_OUTPATIENT_CLINIC_OR_DEPARTMENT_OTHER): Payer: Medicare PPO | Admitting: Oncology

## 2022-09-06 ENCOUNTER — Inpatient Hospital Stay: Payer: Medicare PPO

## 2022-09-06 VITALS — BP 124/69 | HR 67 | Temp 98.2°F | Resp 16 | Ht 62.0 in | Wt 125.0 lb

## 2022-09-06 DIAGNOSIS — Z85828 Personal history of other malignant neoplasm of skin: Secondary | ICD-10-CM | POA: Diagnosis not present

## 2022-09-06 DIAGNOSIS — R197 Diarrhea, unspecified: Secondary | ICD-10-CM | POA: Diagnosis not present

## 2022-09-06 DIAGNOSIS — C9002 Multiple myeloma in relapse: Secondary | ICD-10-CM | POA: Diagnosis not present

## 2022-09-06 DIAGNOSIS — G629 Polyneuropathy, unspecified: Secondary | ICD-10-CM | POA: Diagnosis not present

## 2022-09-06 DIAGNOSIS — C9000 Multiple myeloma not having achieved remission: Secondary | ICD-10-CM

## 2022-09-06 DIAGNOSIS — Z87440 Personal history of urinary (tract) infections: Secondary | ICD-10-CM | POA: Diagnosis not present

## 2022-09-06 MED ORDER — SODIUM CHLORIDE 0.9 % IV SOLN
Freq: Once | INTRAVENOUS | Status: AC
  Filled 2022-09-06: qty 250

## 2022-09-06 MED ORDER — ZOLEDRONIC ACID 4 MG/5ML IV CONC
3.0000 mg | Freq: Once | INTRAVENOUS | Status: AC
  Administered 2022-09-06: 3 mg via INTRAVENOUS
  Filled 2022-09-06: qty 3.75

## 2022-09-06 NOTE — Progress Notes (Signed)
[place]  [phone] [phone]  ID: Rhonda [name] OB: 01/24/1934  MR#: [id number]  [id number]  Patient Care Team: Jonetta Osgood, NP as PCP - General (Nurse Practitioner) Lloyd Huger, MD as Consulting Physician (Oncology)   CHIEF COMPLAINT:  Multiple myeloma in relapse.  INTERVAL HISTORY: Patient returns to clinic today for repeat laboratory work, further evaluation, and continuation of treatment.  She continues to have persistent diarrhea, but otherwise feels well.  She has a chronic peripheral neuropathy, but no other neurologic complaints.  She denies any fevers. She has a good appetite and denies weight loss.  She denies any chest [name], shortness of breath, cough, or hemoptysis.  She denies any nausea, vomiting, or constipation. She has no melena or hematochezia.  She has no urinary complaints.  Patient offers no further specific complaints today.   REVIEW OF SYSTEMS:   Review of Systems  Constitutional: Negative.  Negative for fever, malaise/fatigue and weight loss.  HENT:  Negative for congestion and sore throat.   Respiratory: Negative.  Negative for cough and shortness of breath.   Cardiovascular: Negative.  Negative for chest [name], palpitations and leg swelling.  Gastrointestinal:  Positive for diarrhea. Negative for abdominal [name], blood in stool, constipation, melena, nausea and vomiting.  Genitourinary: Negative.  Negative for dysuria.  Musculoskeletal: Negative.  Negative for back [name].  Skin: Negative.  Negative for rash.  Neurological:  Positive for tingling and sensory change. Negative for focal weakness and weakness.  Psychiatric/Behavioral: Negative.  The patient is not nervous/anxious and does not have insomnia.     As per HPI. Otherwise, a complete review of systems is negative.  PAST MEDICAL HISTORY: Past Medical History:  Diagnosis Date   Actinic keratosis    Arthritis    hands   Benign neoplasm  of ascending colon    Cataract    GERD (gastroesophageal reflux disease)    Hyperlipemia    Hypertension 01/31/2015   Hypothyroidism    Melanoma ([facility]) 04/28/2018   R mid to distal ant lat thigh. MM, SS, tumor thickness 0.14mm, antatomic level II. Excised: 05/13/18, margins free   Multiple myeloma ([facility]) 04/18/2016   Multiple myeloma in remission ([facility]) 04/18/2016   Osteoporosis    Shingles    Skin cancer    basal cell   Squamous cell carcinoma of skin 02/04/2014   Left pretibial. KA-like pattern   Squamous cell carcinoma of skin 09/15/2015   Right lat. superior ankle are. Superficial infiltration. Tx: EDC   Squamous cell carcinoma of skin 10/15/2016   Left medial mid lower leg. Tx: EDC   Squamous cell carcinoma of skin 11/27/2018   Right dorsum proximal forearm.   Squamous cell carcinoma of skin 09/09/2019   Right cheek lat. to mid nasolabial area. Tx: EDC   Squamous cell carcinoma of skin 12/09/2019   right pretibial below knee   Squamous cell carcinoma of skin 06/25/2022   Left ant forearm - EDC    PAST SURGICAL HISTORY: Past Surgical History:  Procedure Laterality Date   BREAST EXCISIONAL BIOPSY Right 15+ yrs ago   EXCISIONAL - NEG   CATARACT EXTRACTION W/ INTRAOCULAR LENS IMPLANT     CHOLECYSTECTOMY N/A 03/14/2017   Procedure: LAPAROSCOPIC CHOLECYSTECTOMY;  Surgeon: Florene Glen, MD;  Location: [facility];  Service: General;  Laterality: N/A;   COLONOSCOPY WITH PROPOFOL N/A 02/21/2015   Procedure: COLONOSCOPY WITH PROPOFOL;  Surgeon: Lucilla Lame, MD;  Location: [facility];  Service: Endoscopy;  Laterality: N/A;  LAPAROSCOPIC HYSTERECTOMY  1980   POLYPECTOMY  02/21/2015   Procedure: POLYPECTOMY;  Surgeon: Lucilla Lame, MD;  Location: [facility];  Service: Endoscopy;;    FAMILY HISTORY: Family History  Problem Relation Age of Onset   Heart disease Mother    Stroke Father    Heart disease Brother    Kidney cancer Neg Hx    Prostate cancer Neg Hx     Bladder Cancer Neg Hx    Breast cancer Neg Hx     ADVANCED DIRECTIVES (Y/N):  N  HEALTH MAINTENANCE: Social History   Tobacco Use   Smoking status: Never   Smokeless tobacco: Never  Vaping Use   Vaping Use: Never used  Substance Use Topics   Alcohol use: No    Alcohol/week: 0.0 standard drinks of alcohol   Drug use: No     Colonoscopy:  PAP:  Bone density:  Lipid panel:  Allergies  Allergen Reactions   Revlimid [Lenalidomide]     Current Outpatient Medications  Medication Sig Dispense Refill   aspirin EC 81 MG tablet Take 81 mg by mouth daily.     Cyanocobalamin (B-12 PO) Take 500 mcg by mouth daily.      EPINEPHrine 0.3 mg/0.3 mL IJ SOAJ injection      fexofenadine (ALLEGRA) 180 MG tablet Take 180 mg by mouth daily.     levothyroxine (SYNTHROID) 50 MCG tablet TAKE 1 TABLET BY MOUTH DAILY BEFORE BREAKFAST 90 tablet 3   Multiple Vitamins-Minerals (CENTRUM SILVER PO) Take 1 tablet by mouth daily.     POMALYST 2 MG capsule TAKE 1 CAPSULE BY MOUTH EVERY DAY FOR 21 DAYS ON, THEN 7 DAYS OFF. REPEAT EVERY 28-DAYS. 21 capsule 0   triamcinolone (NASACORT) 55 MCG/ACT AERO nasal inhaler Place 2 sprays into the nose 2 (two) times daily as needed.     chlorpheniramine-HYDROcodone (TUSSIONEX) 10-8 MG/5ML Take 5 mLs by mouth every 12 (twelve) hours as needed for cough. (Patient not taking: Reported on 08/13/2022) 140 mL 0   potassium chloride SA (KLOR-CON M) 20 MEQ tablet Take 1 tablet (20 mEq total) by mouth daily. (Patient not taking: Reported on 08/13/2022) 6 tablet 0   No current facility-administered medications for this visit.   Facility-Administered Medications Ordered in Other Visits  Medication Dose Route Frequency Provider Last Rate Last Admin   0.9 %  sodium chloride infusion   Intravenous Continuous Lloyd Huger, MD 10 mL/hr at 06/07/22 1358 New Bag at 06/07/22 1358    OBJECTIVE: Vitals:   09/06/22 1424  BP: 124/69  Pulse: 67  Resp: 16  Temp: 98.2 F (36.8 C)   SpO2: 100%     Body mass index is 22.86 kg/m.    ECOG FS:0 - Asymptomatic  General: Well-developed, well-nourished, no acute distress. Eyes: Pink conjunctiva, anicteric sclera. HEENT: Normocephalic, moist mucous membranes. Lungs: No audible wheezing or coughing. Heart: Regular rate and rhythm. Abdomen: Soft, nontender, no obvious distention. Musculoskeletal: No edema, cyanosis, or clubbing. Neuro: Alert, answering all questions appropriately. Cranial nerves grossly intact. Skin: No rashes or petechiae noted. Psych: Normal affect.  LAB RESULTS:  Lab Results  Component Value Date   NA 138 08/30/2022   K 3.4 (L) 08/30/2022   CL 108 08/30/2022   CO2 25 08/30/2022   GLUCOSE 94 08/30/2022   BUN 12 08/30/2022   CREATININE 0.78 08/30/2022   CALCIUM 8.4 (L) 08/30/2022   PROT 6.1 (L) 08/30/2022   ALBUMIN 3.7 08/30/2022   AST 22 08/30/2022  ALT 31 08/30/2022   ALKPHOS 55 08/30/2022   BILITOT 0.4 08/30/2022   GFRNONAA >60 08/30/2022   GFRAA 40 (L) 03/01/2020    Lab Results  Component Value Date   WBC 3.3 (L) 08/30/2022   NEUTROABS 1.4 (L) 08/30/2022   HGB 12.5 08/30/2022   HCT 36.8 08/30/2022   MCV 95.3 08/30/2022   PLT 210 08/30/2022   Lab Results  Component Value Date   TOTALPROTELP 5.7 (L) 08/30/2022   ALBUMINELP 3.5 05/16/2021   A1GS 0.2 05/16/2021   A2GS 0.6 05/16/2021   BETS 0.8 05/16/2021   GAMS 0.5 05/16/2021   MSPIKE Not Observed 05/16/2021   SPEI Comment 05/16/2021     STUDIES: No results found.  ASSESSMENT:  Multiple myeloma in relapse.  PLAN:    Multiple myeloma in relapse: Bone marrow biopsy on April 09, 2016 revealed 44% plasma cells and normal cytogenetics. Given the results of her metastatic bone survey on March 12, 2016, her elevated lambda free chains, and hypercalcemia, patient fit the criteria for multiple myeloma and was initially treated with single agent Revlimid.  She could not tolerate Revlimid and was switched to pomalidomide 2 mg  daily for 21 days with a 7-day break.  Metastatic bone survey on February 12, 2019 revealed new small lesions in her calvarium.  Patient's most recent M spike is now 0.0 and her immunoglobulins remain decreased.  Lambda chains are stable at 392.7.  Patient completed 1 year of monthly treatment of Zometa, now only receives treatment every 3 months.  Continue pomalidomide as prescribed.  Return to clinic in 3 months with repeat laboratory work, further evaluation, and continuation of treatment.   Peripheral neuropathy: Chronic and unchanged.  Patient was previously given a referral to acupuncture which she said did not help much.  Hypocalcemia: Chronic unchanged.  Patient previously had hypercalcemia.  Continue Zometa as scheduled.   Recurrent UTIs: Patient is currently asymptomatic.  Continue monitoring and treatment per primary care. Diarrhea: Patient was given a referral to GI.  Continue Imodium as needed.  Patient expressed understanding and was in agreement with this plan. She also understands that She can call clinic at any time with any questions, concerns, or complaints.    Lloyd Huger, MD 09/07/22 4:19 PM

## 2022-09-06 NOTE — Progress Notes (Signed)
Calcium 8.4, okay to proceed with Zometa per Dr. Grayland Ormond.

## 2022-09-06 NOTE — Progress Notes (Signed)
Having bad diarrhea that she would like to talk about.

## 2022-09-07 ENCOUNTER — Encounter: Payer: Self-pay | Admitting: Oncology

## 2022-09-14 DIAGNOSIS — J301 Allergic rhinitis due to pollen: Secondary | ICD-10-CM | POA: Diagnosis not present

## 2022-09-28 DIAGNOSIS — J301 Allergic rhinitis due to pollen: Secondary | ICD-10-CM | POA: Diagnosis not present

## 2022-10-03 DIAGNOSIS — J301 Allergic rhinitis due to pollen: Secondary | ICD-10-CM | POA: Diagnosis not present

## 2022-10-07 ENCOUNTER — Other Ambulatory Visit: Payer: Self-pay | Admitting: Oncology

## 2022-10-07 DIAGNOSIS — C9000 Multiple myeloma not having achieved remission: Secondary | ICD-10-CM

## 2022-10-08 ENCOUNTER — Encounter: Payer: Self-pay | Admitting: Oncology

## 2022-10-12 DIAGNOSIS — J301 Allergic rhinitis due to pollen: Secondary | ICD-10-CM | POA: Diagnosis not present

## 2022-10-12 MED ORDER — POMALIDOMIDE 2 MG PO CAPS: ORAL_CAPSULE | ORAL | 0 refills | Status: DC

## 2022-10-12 NOTE — Telephone Encounter (Signed)
Received call from patient stating that when she called the pharmacy about her Pomalyst they reported missing REMs auth. REMs auth obtained and rx resent. Patient informed.

## 2022-10-12 NOTE — Addendum Note (Signed)
Addended by: Remi Haggard on: 10/12/2022 09:58 AM   Modules accepted: Orders

## 2022-10-15 DIAGNOSIS — J301 Allergic rhinitis due to pollen: Secondary | ICD-10-CM | POA: Diagnosis not present

## 2022-10-19 DIAGNOSIS — J301 Allergic rhinitis due to pollen: Secondary | ICD-10-CM | POA: Diagnosis not present

## 2022-10-26 DIAGNOSIS — J301 Allergic rhinitis due to pollen: Secondary | ICD-10-CM | POA: Diagnosis not present

## 2022-11-01 DIAGNOSIS — H40153 Residual stage of open-angle glaucoma, bilateral: Secondary | ICD-10-CM | POA: Diagnosis not present

## 2022-11-02 DIAGNOSIS — J301 Allergic rhinitis due to pollen: Secondary | ICD-10-CM | POA: Diagnosis not present

## 2022-11-04 ENCOUNTER — Other Ambulatory Visit: Payer: Self-pay | Admitting: Oncology

## 2022-11-04 DIAGNOSIS — C9000 Multiple myeloma not having achieved remission: Secondary | ICD-10-CM

## 2022-11-06 ENCOUNTER — Encounter: Payer: Self-pay | Admitting: Oncology

## 2022-11-16 DIAGNOSIS — J301 Allergic rhinitis due to pollen: Secondary | ICD-10-CM | POA: Diagnosis not present

## 2022-11-23 DIAGNOSIS — J301 Allergic rhinitis due to pollen: Secondary | ICD-10-CM | POA: Diagnosis not present

## 2022-11-27 ENCOUNTER — Other Ambulatory Visit: Payer: Self-pay | Admitting: *Deleted

## 2022-11-27 DIAGNOSIS — C9000 Multiple myeloma not having achieved remission: Secondary | ICD-10-CM

## 2022-11-27 MED ORDER — POMALIDOMIDE 2 MG PO CAPS
ORAL_CAPSULE | ORAL | 0 refills | Status: DC
Start: 2022-11-27 — End: 2022-12-24

## 2022-11-30 ENCOUNTER — Inpatient Hospital Stay: Payer: Medicare PPO | Attending: Oncology

## 2022-11-30 DIAGNOSIS — Z79899 Other long term (current) drug therapy: Secondary | ICD-10-CM | POA: Diagnosis not present

## 2022-11-30 DIAGNOSIS — C9000 Multiple myeloma not having achieved remission: Secondary | ICD-10-CM | POA: Diagnosis not present

## 2022-11-30 DIAGNOSIS — J301 Allergic rhinitis due to pollen: Secondary | ICD-10-CM | POA: Diagnosis not present

## 2022-11-30 DIAGNOSIS — C9002 Multiple myeloma in relapse: Secondary | ICD-10-CM | POA: Diagnosis present

## 2022-11-30 LAB — COMPREHENSIVE METABOLIC PANEL
ALT: 16 U/L (ref 0–44)
AST: 19 U/L (ref 15–41)
Albumin: 3.7 g/dL (ref 3.5–5.0)
Alkaline Phosphatase: 70 U/L (ref 38–126)
Anion gap: 9 (ref 5–15)
BUN: 24 mg/dL — ABNORMAL HIGH (ref 8–23)
CO2: 21 mmol/L — ABNORMAL LOW (ref 22–32)
Calcium: 8.7 mg/dL — ABNORMAL LOW (ref 8.9–10.3)
Chloride: 106 mmol/L (ref 98–111)
Creatinine, Ser: 0.85 mg/dL (ref 0.44–1.00)
GFR, Estimated: >60 mL/min (ref >60)
Glucose, Bld: 114 mg/dL — ABNORMAL HIGH (ref 70–99)
Potassium: 3.8 mmol/L (ref 3.5–5.1)
Sodium: 136 mmol/L (ref 135–145)
Total Bilirubin: 0.5 mg/dL (ref 0.3–1.2)
Total Protein: 6.2 g/dL — ABNORMAL LOW (ref 6.5–8.1)

## 2022-11-30 LAB — CBC WITH DIFFERENTIAL/PLATELET
Abs Immature Granulocytes: 0.03 10*3/uL (ref 0.00–0.07)
Basophils Absolute: 0.1 10*3/uL (ref 0.0–0.1)
Basophils Relative: 2 %
Eosinophils Absolute: 0.1 10*3/uL (ref 0.0–0.5)
Eosinophils Relative: 2 %
HCT: 37.6 % (ref 36.0–46.0)
Hemoglobin: 12.5 g/dL (ref 12.0–15.0)
Immature Granulocytes: 1 %
Lymphocytes Relative: 21 %
Lymphs Abs: 1.1 10*3/uL (ref 0.7–4.0)
MCH: 32.4 pg (ref 26.0–34.0)
MCHC: 33.2 g/dL (ref 30.0–36.0)
MCV: 97.4 fL (ref 80.0–100.0)
Monocytes Absolute: 1.1 10*3/uL — ABNORMAL HIGH (ref 0.1–1.0)
Monocytes Relative: 22 %
Neutro Abs: 2.8 10*3/uL (ref 1.7–7.7)
Neutrophils Relative %: 52 %
Platelets: 161 10*3/uL (ref 150–400)
RBC: 3.86 MIL/uL — ABNORMAL LOW (ref 3.87–5.11)
RDW: 13.4 % (ref 11.5–15.5)
WBC: 5.2 10*3/uL (ref 4.0–10.5)
nRBC: 0 % (ref 0.0–0.2)

## 2022-12-02 LAB — IGG, IGA, IGM
IgA: 99 mg/dL (ref 64–422)
IgG (Immunoglobin G), Serum: 682 mg/dL (ref 586–1602)
IgM (Immunoglobulin M), Srm: 23 mg/dL — ABNORMAL LOW (ref 26–217)

## 2022-12-03 ENCOUNTER — Encounter: Payer: Self-pay | Admitting: Gastroenterology

## 2022-12-03 ENCOUNTER — Ambulatory Visit: Payer: Medicare PPO | Admitting: Gastroenterology

## 2022-12-03 VITALS — BP 124/59 | HR 62 | Temp 97.7°F | Ht 60.0 in | Wt 129.0 lb

## 2022-12-03 DIAGNOSIS — R197 Diarrhea, unspecified: Secondary | ICD-10-CM

## 2022-12-03 DIAGNOSIS — R140 Abdominal distension (gaseous): Secondary | ICD-10-CM

## 2022-12-03 LAB — KAPPA/LAMBDA LIGHT CHAINS
Kappa free light chain: 31 mg/L — ABNORMAL HIGH (ref 3.3–19.4)
Kappa, lambda light chain ratio: 0.08 — ABNORMAL LOW (ref 0.26–1.65)
Lambda free light chains: 373.8 mg/L — ABNORMAL HIGH (ref 5.7–26.3)

## 2022-12-03 NOTE — Progress Notes (Signed)
Gastroenterology Consultation  Referring Provider:     Sallyanne Kuster, NP Primary Care Physician:  Sallyanne Kuster, NP Primary Gastroenterologist:  Dr. Servando Snare     Reason for Consultation:     Diarrhea        HPI:   Rhonda [name] is a 89 y.o. y/o female referred for consultation & management of diarrhea by Dr. Sallyanne Kuster, NP.  This patient comes in today after being seen by me back in 2016 for colonoscopy.  The patient had been followed by Dr. Orlie Dakin from oncology for multiple myeloma.  The patient had noted diarrhea and was recommended to continue the Imodium and follow-up with me for the diarrhea.  The patient had denied any nausea, vomiting or constipation.  She also denied any black stools or bloody stools. The patient reports that she does not have any loose bowel movement during the 21 days that she is on her medication for her multiple myeloma and usually has the diarrhea for 1 day during the 7 days she is off the medication.  She did have a episode of 5 days of significant diarrhea which required her to be in the hospital for fluid resuscitation.  She states that at that time she felt like she may have had an infection.  She is not having that at the present time.  She does report that one of her biggest things that bother her is that she is very gassy.  Past Medical History:  Diagnosis Date   Actinic keratosis    Arthritis    hands   Benign neoplasm of ascending colon    Cataract    GERD (gastroesophageal reflux disease)    Hyperlipemia    Hypertension 01/31/2015   Hypothyroidism    Melanoma ([facility]) 04/28/2018   R mid to distal ant lat thigh. MM, SS, tumor thickness 0.59mm, antatomic level II. Excised: 05/13/18, margins free   Multiple myeloma ([facility]) 04/18/2016   Multiple myeloma in remission ([facility]) 04/18/2016   Osteoporosis    Shingles    Skin cancer    basal cell   Squamous cell carcinoma of skin 02/04/2014   Left pretibial. KA-like pattern   Squamous cell  carcinoma of skin 09/15/2015   Right lat. superior ankle are. Superficial infiltration. Tx: EDC   Squamous cell carcinoma of skin 10/15/2016   Left medial mid lower leg. Tx: EDC   Squamous cell carcinoma of skin 11/27/2018   Right dorsum proximal forearm.   Squamous cell carcinoma of skin 09/09/2019   Right cheek lat. to mid nasolabial area. Tx: EDC   Squamous cell carcinoma of skin 12/09/2019   right pretibial below knee   Squamous cell carcinoma of skin 06/25/2022   Left ant forearm - EDC    Past Surgical History:  Procedure Laterality Date   BREAST EXCISIONAL BIOPSY Right 15+ yrs ago   EXCISIONAL - NEG   CATARACT EXTRACTION W/ INTRAOCULAR LENS IMPLANT     CHOLECYSTECTOMY N/A 03/14/2017   Procedure: LAPAROSCOPIC CHOLECYSTECTOMY;  Surgeon: Lattie Haw, MD;  Location: [facility];  Service: General;  Laterality: N/A;   COLONOSCOPY WITH PROPOFOL N/A 02/21/2015   Procedure: COLONOSCOPY WITH PROPOFOL;  Surgeon: Midge Minium, MD;  Location: [facility];  Service: Endoscopy;  Laterality: N/A;   LAPAROSCOPIC HYSTERECTOMY  1980   POLYPECTOMY  02/21/2015   Procedure: POLYPECTOMY;  Surgeon: Midge Minium, MD;  Location: [facility];  Service: Endoscopy;;    Prior to Admission medications   Medication Sig Start Date  End Date Taking? Authorizing Provider  aspirin EC 81 MG tablet Take 81 mg by mouth daily.    [provider]  chlorpheniramine-HYDROcodone (TUSSIONEX) 10-8 MG/5ML Take 5 mLs by mouth every 12 (twelve) hours as needed for cough. Patient not taking: Reported on 08/13/2022 07/11/22   Sallyanne Kuster, NP  Cyanocobalamin (B-12 PO) Take 500 mcg by mouth daily.     [provider]  EPINEPHrine 0.3 mg/0.3 mL IJ SOAJ injection  08/19/17   [provider]  fexofenadine (ALLEGRA) 180 MG tablet Take 180 mg by mouth daily.    [provider]  levothyroxine (SYNTHROID) 50 MCG tablet TAKE 1 TABLET BY MOUTH DAILY BEFORE BREAKFAST 03/20/22    Sallyanne Kuster, NP  Multiple Vitamins-Minerals (CENTRUM SILVER PO) Take 1 tablet by mouth daily.    [provider]  pomalidomide (POMALYST) 2 MG capsule TAKE 1 CAPSULE BY MOUTH EVERY DAY FOR 21 DAYS ON, THEN 7 DAYS OFF. REPEAT EVERY 28 DAYS 11/27/22   Jeralyn Ruths, MD  potassium chloride SA (KLOR-CON M) 20 MEQ tablet Take 1 tablet (20 mEq total) by mouth daily. Patient not taking: Reported on 08/13/2022 07/31/22   Remi Haggard, RPH-CPP  triamcinolone (NASACORT) 55 MCG/ACT AERO nasal inhaler Place 2 sprays into the nose 2 (two) times daily as needed.    [provider]    Family History  Problem Relation Age of Onset   Heart disease Mother    Stroke Father    Heart disease Brother    Kidney cancer Neg Hx    Prostate cancer Neg Hx    Bladder Cancer Neg Hx    Breast cancer Neg Hx      Social History   Tobacco Use   Smoking status: Never   Smokeless tobacco: Never  Vaping Use   Vaping Use: Never used  Substance Use Topics   Alcohol use: No    Alcohol/week: 0.0 standard drinks of alcohol   Drug use: No    Allergies as of 12/03/2022 - Review Complete 09/06/2022  Allergen Reaction Noted   Revlimid [lenalidomide]  09/09/2019    Review of Systems:    All systems reviewed and negative except where noted in HPI.   Physical Exam:  There were no vitals taken for this visit. No LMP recorded. Patient has had a hysterectomy. General:   Alert,  Well-developed, well-nourished, pleasant and cooperative in NAD Head:  Normocephalic and atraumatic. Eyes:  Sclera clear, no icterus.   Conjunctiva pink. Ears:  Normal auditory acuity. Neurologic:  Alert and oriented x3;  grossly normal neurologically. Skin:  Intact without significant lesions or rashes.  No jaundice. Psych:  Alert and cooperative. Normal mood and affect.  Imaging Studies: No results found.  Assessment and Plan:   Rhonda [name] is a 89 y.o. y/o female who comes in today with a report of  diarrhea proximately 1 day out of the month.  She also reports that her main concern is of a lot of gas.  The patient has been told about dairy products and milk products for which she states she does eat cheese on a regular basis and does not avoid dairy products.  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  If it does not then she may need stool studies sent off and possibly checking for SIBO.  The patient has been explained the plan and agrees with it.    Dalten Ambrosino  Kilpatrick Norris, MD. Marval Regal    Note: This dictation was prepared with Dragon dictation along with smaller phrase technology. Any transcriptional errors that result from this process are unintentional.

## 2022-12-03 NOTE — Patient Instructions (Signed)
Cut the dairy and let us know how you do

## 2022-12-04 NOTE — Progress Notes (Signed)
Oral Chemotherapy Clinic [facility]  [phone] [phone]  Patient Care Team: Sallyanne Kuster, NP as PCP - General (Nurse Practitioner) Jeralyn Ruths, MD as Consulting Physician (Oncology)   Name of the patient: Rhonda [name]  [id number]  [birth year]   Date of visit: 12/04/22  HPI: Patient is a 89 y.o. female with multiple myeloma. Currently on treatment with pomalidomide.   Reason for Consult: Oral chemotherapy follow-up for pomalidomide therapy.   PAST MEDICAL HISTORY: Past Medical History:  Diagnosis Date   Actinic keratosis    Arthritis    hands   Benign neoplasm of ascending colon    Cataract    GERD (gastroesophageal reflux disease)    Hyperlipemia    Hypertension 01/31/2015   Hypothyroidism    Melanoma ([facility]) 04/28/2018   R mid to distal ant lat thigh. MM, SS, tumor thickness 0.70mm, antatomic level II. Excised: 05/13/18, margins free   Multiple myeloma ([facility]) 04/18/2016   Multiple myeloma in remission ([facility]) 04/18/2016   Osteoporosis    Shingles    Skin cancer    basal cell   Squamous cell carcinoma of skin 02/04/2014   Left pretibial. KA-like pattern   Squamous cell carcinoma of skin 09/15/2015   Right lat. superior ankle are. Superficial infiltration. Tx: EDC   Squamous cell carcinoma of skin 10/15/2016   Left medial mid lower leg. Tx: EDC   Squamous cell carcinoma of skin 11/27/2018   Right dorsum proximal forearm.   Squamous cell carcinoma of skin 09/09/2019   Right cheek lat. to mid nasolabial area. Tx: EDC   Squamous cell carcinoma of skin 12/09/2019   right pretibial below knee   Squamous cell carcinoma of skin 06/25/2022   Left ant forearm - EDC    HEMATOLOGY/ONCOLOGY HISTORY:  Oncology History  Multiple myeloma ([facility])  04/18/2016 Initial Diagnosis   Multiple myeloma ([facility])     ALLERGIES:  is allergic to revlimid [lenalidomide].  MEDICATIONS:  Current Outpatient Medications  Medication Sig  Dispense Refill   aspirin EC 81 MG tablet Take 81 mg by mouth daily.     Cyanocobalamin (B-12 PO) Take 500 mcg by mouth daily.      EPINEPHrine 0.3 mg/0.3 mL IJ SOAJ injection      fexofenadine (ALLEGRA) 180 MG tablet Take 180 mg by mouth daily.     levothyroxine (SYNTHROID) 50 MCG tablet TAKE 1 TABLET BY MOUTH DAILY BEFORE BREAKFAST 90 tablet 3   Multiple Vitamins-Minerals (CENTRUM SILVER PO) Take 1 tablet by mouth daily.     pomalidomide (POMALYST) 2 MG capsule TAKE 1 CAPSULE BY MOUTH EVERY DAY FOR 21 DAYS ON, THEN 7 DAYS OFF. REPEAT EVERY 28 DAYS 21 capsule 0   potassium chloride SA (KLOR-CON M) 20 MEQ tablet Take 1 tablet (20 mEq total) by mouth daily. 6 tablet 0   triamcinolone (NASACORT) 55 MCG/ACT AERO nasal inhaler Place 2 sprays into the nose 2 (two) times daily as needed.     No current facility-administered medications for this visit.   Facility-Administered Medications Ordered in Other Visits  Medication Dose Route Frequency Provider Last Rate Last Admin   0.9 %  sodium chloride infusion   Intravenous Continuous Jeralyn Ruths, MD 10 mL/hr at 06/07/22 1358 New Bag at 06/07/22 1358    VITAL SIGNS: There were no vitals taken for this visit. There were no vitals filed for this visit.  Estimated body mass index is 25.19 kg/m as calculated from the following:   Height as of  12/03/22: 5' (1.524 m).   Weight as of 12/03/22: 58.5 kg (129 lb).  LABS: CBC:    Component Value Date/Time   WBC 5.2 11/30/2022 1317   HGB 12.5 11/30/2022 1317   HCT 37.6 11/30/2022 1317   PLT 161 11/30/2022 1317   MCV 97.4 11/30/2022 1317   NEUTROABS 2.8 11/30/2022 1317   LYMPHSABS 1.1 11/30/2022 1317   MONOABS 1.1 (H) 11/30/2022 1317   EOSABS 0.1 11/30/2022 1317   BASOSABS 0.1 11/30/2022 1317   Comprehensive Metabolic Panel:    Component Value Date/Time   NA 136 11/30/2022 1317   NA 138 07/24/2022 1044   K 3.8 11/30/2022 1317   CL 106 11/30/2022 1317   CO2 21 (L) 11/30/2022 1317   BUN  24 (H) 11/30/2022 1317   BUN 19 07/24/2022 1044   CREATININE 0.85 11/30/2022 1317   GLUCOSE 114 (H) 11/30/2022 1317   CALCIUM 8.7 (L) 11/30/2022 1317   AST 19 11/30/2022 1317   ALT 16 11/30/2022 1317   ALKPHOS 70 11/30/2022 1317   BILITOT 0.5 11/30/2022 1317   BILITOT 0.5 07/24/2022 1044   PROT 6.2 (L) 11/30/2022 1317   PROT 6.6 07/24/2022 1044   ALBUMIN 3.7 11/30/2022 1317   ALBUMIN 4.5 07/24/2022 1044     Present during today's visit: patient only  Assessment and Plan: Reviewed CBC/CMP/light chains/Ig with patient, continue pomalidomide 2mg  21on/7off Today she is on day 22 of her cycle Okay to receive Zometa today   Oral Chemotherapy Side Effect/Intolerance:  No reported diarrhea or fatigue  Constipation: patient reports using sennakot as needed, butt she is going to give miralax a try  Oral Chemotherapy Adherence: no missed doses reported No patient barriers to medication adherence identified.   New medications: none reported  Medication Access Issues: No issues, patient fills at [facility]  Patient expressed understanding and was in agreement with this plan. She also understands that She can call clinic at any time with any questions, concerns, or complaints.   Follow-up plan: RTC 3 months: see MD, Zometa , labs 1 week prior  Thank you for allowing me to participate in the care of this very pleasant patient.   Time Total: 15 mins  Visit consisted of counseling and education on dealing with issues of symptom management in the setting of serious and potentially life-threatening illness.Greater than 50%  of this time was spent counseling and coordinating care related to the above assessment and plan.  Signed by: Remi Haggard, PharmD, BCPS, Nolon Bussing, CPP Hematology/Oncology Clinical Pharmacist Practitioner [place]/DB/AP Oral Chemotherapy Navigation Clinic [phone]  12/04/2022 3:56 PM

## 2022-12-05 ENCOUNTER — Encounter: Payer: Self-pay | Admitting: Pharmacist

## 2022-12-05 ENCOUNTER — Inpatient Hospital Stay: Payer: Medicare PPO

## 2022-12-05 ENCOUNTER — Inpatient Hospital Stay: Payer: Medicare PPO | Admitting: Pharmacist

## 2022-12-05 VITALS — BP 114/58 | HR 71 | Temp 99.6°F | Resp 16 | Ht 60.0 in | Wt 129.9 lb

## 2022-12-05 DIAGNOSIS — Z79899 Other long term (current) drug therapy: Secondary | ICD-10-CM | POA: Diagnosis not present

## 2022-12-05 DIAGNOSIS — C9000 Multiple myeloma not having achieved remission: Secondary | ICD-10-CM | POA: Diagnosis not present

## 2022-12-05 MED ORDER — SODIUM CHLORIDE 0.9 % IV SOLN
INTRAVENOUS | Status: DC
  Filled 2022-12-05: qty 250

## 2022-12-05 MED ORDER — ZOLEDRONIC ACID 4 MG/5ML IV CONC
3.0000 mg | Freq: Once | INTRAVENOUS | Status: AC
  Administered 2022-12-05: 3 mg via INTRAVENOUS
  Filled 2022-12-05: qty 3.75

## 2022-12-06 ENCOUNTER — Ambulatory Visit: Payer: Medicare PPO

## 2022-12-06 ENCOUNTER — Ambulatory Visit: Payer: Medicare PPO | Admitting: Oncology

## 2022-12-06 ENCOUNTER — Ambulatory Visit: Payer: Medicare PPO | Admitting: Pharmacist

## 2022-12-07 DIAGNOSIS — J301 Allergic rhinitis due to pollen: Secondary | ICD-10-CM | POA: Diagnosis not present

## 2022-12-07 LAB — IMMUNOFIXATION ELECTROPHORESIS
IgA: 92 mg/dL (ref 64–422)
IgG (Immunoglobin G), Serum: 637 mg/dL (ref 586–1602)
IgM (Immunoglobulin M), Srm: 23 mg/dL — ABNORMAL LOW (ref 26–217)
Total Protein ELP: 5.6 g/dL — ABNORMAL LOW (ref 6.0–8.5)

## 2022-12-23 ENCOUNTER — Other Ambulatory Visit: Payer: Self-pay | Admitting: Oncology

## 2022-12-23 DIAGNOSIS — C9000 Multiple myeloma not having achieved remission: Secondary | ICD-10-CM

## 2022-12-24 ENCOUNTER — Encounter: Payer: Self-pay | Admitting: Oncology

## 2022-12-24 ENCOUNTER — Encounter: Payer: Self-pay | Admitting: Nurse Practitioner

## 2022-12-24 ENCOUNTER — Telehealth: Payer: Medicare PPO | Admitting: Nurse Practitioner

## 2022-12-24 VITALS — Ht 60.0 in | Wt 125.0 lb

## 2022-12-24 DIAGNOSIS — J0100 Acute maxillary sinusitis, unspecified: Secondary | ICD-10-CM

## 2022-12-24 MED ORDER — AMOXICILLIN-POT CLAVULANATE 875-125 MG PO TABS
1.0000 | ORAL_TABLET | Freq: Two times a day (BID) | ORAL | 0 refills | Status: AC
Start: 2022-12-24 — End: 2023-01-03

## 2022-12-24 MED ORDER — PREDNISONE 10 MG PO TABS
ORAL_TABLET | ORAL | 0 refills | Status: DC
Start: 2022-12-24 — End: 2023-03-26

## 2022-12-24 NOTE — Progress Notes (Signed)
[facility] [address] [place], [place] [address]  Internal MEDICINE  Telephone Visit  Patient Name: Rhonda [name]  [id number]  [id number]  Date of Service: 12/24/2022  I connected with the patient at 1050 by telephone and verified the patients identity using two identifiers.   I discussed the limitations, risks, security and privacy concerns of performing an evaluation and management service by telephone and the availability of in person appointments. I also discussed with the patient that there may be a patient responsible charge related to the service.  The patient expressed understanding and agrees to proceed.    Chief Complaint  Patient presents with   Telephone Assessment    Going on since Thursday    Telephone Screen    Covid test is negative    Sinusitis   Cough    HPI Trilby presents for a telehealth virtual visit for symptoms of sinusitis. Onset of symptoms was 4 days ago Covid test was negative Reports symptoms of cough, nasal congestion, headache, sinus pressure, diarrhea, fatigue, fever,     Current Medication: Outpatient Encounter Medications as of 12/24/2022  Medication Sig Note   amoxicillin-clavulanate (AUGMENTIN) 875-125 MG tablet Take 1 tablet by mouth 2 (two) times daily for 10 days. Take with food    aspirin EC 81 MG tablet Take 81 mg by mouth daily.    Cyanocobalamin (B-12 PO) Take 500 mcg by mouth daily.     EPINEPHrine 0.3 mg/0.3 mL IJ SOAJ injection  08/13/2022: Once a week.   fexofenadine (ALLEGRA) 180 MG tablet Take 180 mg by mouth daily.    levothyroxine (SYNTHROID) 50 MCG tablet TAKE 1 TABLET BY MOUTH DAILY BEFORE BREAKFAST    Multiple Vitamins-Minerals (CENTRUM SILVER PO) Take 1 tablet by mouth daily.    pomalidomide (POMALYST) 2 MG capsule TAKE 1 CAPSULE BY MOUTH EVERY DAY FOR 21 DAYS ON, THEN 7 DAYS OFF. REPEAT EVERY 28 DAYS    potassium chloride SA (KLOR-CON M) 20 MEQ tablet Take 1 tablet (20 mEq total) by mouth daily.     predniSONE (DELTASONE) 10 MG tablet Take one tab 3 x day for 3 days, then take one tab 2 x a day for 3 days and then take one tab a day for 3 days for copd    triamcinolone (NASACORT) 55 MCG/ACT AERO nasal inhaler Place 2 sprays into the nose 2 (two) times daily as needed.    Facility-Administered Encounter Medications as of 12/24/2022  Medication   0.9 %  sodium chloride infusion    Surgical History: Past Surgical History:  Procedure Laterality Date   BREAST EXCISIONAL BIOPSY Right 15+ yrs ago   EXCISIONAL - NEG   CATARACT EXTRACTION W/ INTRAOCULAR LENS IMPLANT     CHOLECYSTECTOMY N/A 03/14/2017   Procedure: LAPAROSCOPIC CHOLECYSTECTOMY;  Surgeon: Lattie Haw, MD;  Location: [facility];  Service: General;  Laterality: N/A;   COLONOSCOPY WITH PROPOFOL N/A 02/21/2015   Procedure: COLONOSCOPY WITH PROPOFOL;  Surgeon: Midge Minium, MD;  Location: [facility];  Service: Endoscopy;  Laterality: N/A;   LAPAROSCOPIC HYSTERECTOMY  1980   POLYPECTOMY  02/21/2015   Procedure: POLYPECTOMY;  Surgeon: Midge Minium, MD;  Location: [facility];  Service: Endoscopy;;    Medical History: Past Medical History:  Diagnosis Date   Actinic keratosis    Arthritis    hands   Benign neoplasm of ascending colon    Cataract    GERD (gastroesophageal reflux disease)    Hyperlipemia    Hypertension  01/31/2015   Hypothyroidism    Melanoma (HCC) 04/28/2018   R mid to distal ant lat thigh. MM, SS, tumor thickness 0.80mm, antatomic level II. Excised: 05/13/18, margins free   Multiple myeloma (HCC) 04/18/2016   Multiple myeloma in remission (HCC) 04/18/2016   Osteoporosis    Shingles    Skin cancer    basal cell   Squamous cell carcinoma of skin 02/04/2014   Left pretibial. KA-like pattern   Squamous cell carcinoma of skin 09/15/2015   Right lat. superior ankle are. Superficial infiltration. Tx: ED[facility]   Squamous cell carcinoma of skin 10/15/2016   Left medial mid lower leg. Tx: [facility]    Squamous cell carcinoma of skin 11/27/2018   Right dorsum proximal forearm.   Squamous cell carcinoma of skin 09/09/2019   Right cheek lat. to mid nasolabial area. Tx: [facility]   Squamous cell carcinoma of skin 12/09/2019   right pretibial below knee   Squamous cell carcinoma of skin 06/25/2022   Left ant forearm - [facility]    Family History: Family History  Problem Relation Age of Onset   Heart disease Mother    Stroke Father    Heart disease Brother    Kidney cancer Neg Hx    Prostate cancer Neg Hx    Bladder Cancer Neg Hx    Breast cancer Neg Hx     Social History   Socioeconomic History   Marital status: Widowed    Spouse name: Not on file   Number of children: Not on file   Years of education: Not on file   Highest education level: Not on file  Occupational History   Not on file  Tobacco Use   Smoking status: Never   Smokeless tobacco: Never  Vaping Use   Vaping status: Never Used  Substance and Sexual Activity   Alcohol use: No    Alcohol/week: 0.0 standard drinks of alcohol   Drug use: No   Sexual activity: Not on file  Other Topics Concern   Not on file  Social History Narrative   Not on file   Social Determinants of Health   Financial Resource Strain: Not on file  Food Insecurity: Not on file  Transportation Needs: Not on file  Physical Activity: Not on file  Stress: Not on file  Social Connections: Not on file  Intimate Partner Violence: Not on file      Review of Systems  Constitutional:  Positive for appetite change, fatigue and fever. Negative for chills.  HENT:  Positive for congestion, ear pain, postnasal drip, sinus pressure, sinus pain, sore throat and trouble swallowing. Negative for dental problem.   Respiratory:  Positive for cough. Negative for chest tightness, shortness of breath and wheezing.   Cardiovascular: Negative.  Negative for chest pain and palpitations.  Gastrointestinal:  Positive for diarrhea. Negative for nausea and vomiting.   Musculoskeletal:  Negative for myalgias.  Neurological:  Positive for headaches.    Vital Signs: Ht 5' (1.524 m)   Wt 125 lb (56.7 kg)   BMI 24.41 kg/m    Observation/Objective: She is alert and oriented and engages in conversation appropriately. No acute distress noted.     Assessment/Plan: 1. Acute non-recurrent maxillary sinusitis Augmentin and prednisone taper prescribed. May continue OTC delsym prn for cough - amoxicillin-clavulanate (AUGMENTIN) 875-125 MG tablet; Take 1 tablet by mouth 2 (two) times daily for 10 days. Take with food  Dispense: 20 tablet; Refill: 0 - predniSONE (DELTASONE) 10 MG tablet; Take one tab  3 x day for 3 days, then take one tab 2 x a day for 3 days and then take one tab a day for 3 days for copd  Dispense: 18 tablet; Refill: 0   General Counseling: Minka verbalizes understanding of the findings of today's phone visit and agrees with plan of treatment. I have discussed any further diagnostic evaluation that may be needed or ordered today. We also reviewed her medications today. she has been encouraged to call the office with any questions or concerns that should arise related to todays visit.  Return if symptoms worsen or fail to improve.   No orders of the defined types were placed in this encounter.   Meds ordered this encounter  Medications   amoxicillin-clavulanate (AUGMENTIN) 875-125 MG tablet    Sig: Take 1 tablet by mouth 2 (two) times daily for 10 days. Take with food    Dispense:  20 tablet    Refill:  0   predniSONE (DELTASONE) 10 MG tablet    Sig: Take one tab 3 x day for 3 days, then take one tab 2 x a day for 3 days and then take one tab a day for 3 days for copd    Dispense:  18 tablet    Refill:  0    Time spent:10 Minutes Time spent with patient included reviewing progress notes, labs, imaging studies, and discussing plan for follow up.  [place] Controlled Substance Database was reviewed by me for overdose risk score (ORS) if  appropriate.  This patient was seen by Sallyanne Kuster, FNP-C in collaboration with Dr. Beverely Risen as a part of collaborative care agreement.  Eduar Kumpf R. Tedd Sias, MSN, FNP-C Internal medicine

## 2022-12-27 ENCOUNTER — Ambulatory Visit: Payer: Medicare PPO | Admitting: Dermatology

## 2022-12-28 DIAGNOSIS — J301 Allergic rhinitis due to pollen: Secondary | ICD-10-CM | POA: Diagnosis not present

## 2023-01-14 ENCOUNTER — Telehealth: Payer: Self-pay

## 2023-01-14 DIAGNOSIS — R197 Diarrhea, unspecified: Secondary | ICD-10-CM

## 2023-01-14 DIAGNOSIS — R140 Abdominal distension (gaseous): Secondary | ICD-10-CM

## 2023-01-14 NOTE — Telephone Encounter (Signed)
Pt is aware of stool study being ordered... lab hours given to pt and lab ordered

## 2023-01-14 NOTE — Telephone Encounter (Signed)
Patient left a  voicemail at 10:15am stating that she saw Dr. Servando Snare and he told her if she did improve with the diarrhea he would order some stool test. She states this weekend she has had a lot of diarrhea, gas and bloating and is wondering if she is needing those stool test

## 2023-01-14 NOTE — Addendum Note (Signed)
Addended by: Roena Malady on: 01/14/2023 02:19 PM   Modules accepted: Orders

## 2023-01-15 ENCOUNTER — Other Ambulatory Visit: Payer: Self-pay | Admitting: Oncology

## 2023-01-15 DIAGNOSIS — C9000 Multiple myeloma not having achieved remission: Secondary | ICD-10-CM

## 2023-01-25 DIAGNOSIS — E039 Hypothyroidism, unspecified: Secondary | ICD-10-CM | POA: Diagnosis not present

## 2023-01-25 DIAGNOSIS — D84821 Immunodeficiency due to drugs: Secondary | ICD-10-CM | POA: Diagnosis not present

## 2023-01-25 DIAGNOSIS — Z8249 Family history of ischemic heart disease and other diseases of the circulatory system: Secondary | ICD-10-CM | POA: Diagnosis not present

## 2023-01-25 DIAGNOSIS — G620 Drug-induced polyneuropathy: Secondary | ICD-10-CM | POA: Diagnosis not present

## 2023-01-25 DIAGNOSIS — J301 Allergic rhinitis due to pollen: Secondary | ICD-10-CM | POA: Diagnosis not present

## 2023-01-25 DIAGNOSIS — M1990 Unspecified osteoarthritis, unspecified site: Secondary | ICD-10-CM | POA: Diagnosis not present

## 2023-01-25 DIAGNOSIS — R030 Elevated blood-pressure reading, without diagnosis of hypertension: Secondary | ICD-10-CM | POA: Diagnosis not present

## 2023-01-25 DIAGNOSIS — C9000 Multiple myeloma not having achieved remission: Secondary | ICD-10-CM | POA: Diagnosis not present

## 2023-01-25 DIAGNOSIS — E785 Hyperlipidemia, unspecified: Secondary | ICD-10-CM | POA: Diagnosis not present

## 2023-01-28 ENCOUNTER — Ambulatory Visit: Payer: Medicare PPO | Admitting: Dermatology

## 2023-01-28 DIAGNOSIS — D229 Melanocytic nevi, unspecified: Secondary | ICD-10-CM

## 2023-01-28 DIAGNOSIS — L570 Actinic keratosis: Secondary | ICD-10-CM | POA: Diagnosis not present

## 2023-01-28 DIAGNOSIS — L821 Other seborrheic keratosis: Secondary | ICD-10-CM

## 2023-01-28 DIAGNOSIS — Z85820 Personal history of malignant melanoma of skin: Secondary | ICD-10-CM

## 2023-01-28 DIAGNOSIS — W908XXA Exposure to other nonionizing radiation, initial encounter: Secondary | ICD-10-CM

## 2023-01-28 DIAGNOSIS — L578 Other skin changes due to chronic exposure to nonionizing radiation: Secondary | ICD-10-CM

## 2023-01-28 DIAGNOSIS — Z1283 Encounter for screening for malignant neoplasm of skin: Secondary | ICD-10-CM | POA: Diagnosis not present

## 2023-01-28 DIAGNOSIS — Z8589 Personal history of malignant neoplasm of other organs and systems: Secondary | ICD-10-CM

## 2023-01-28 DIAGNOSIS — D1801 Hemangioma of skin and subcutaneous tissue: Secondary | ICD-10-CM | POA: Diagnosis not present

## 2023-01-28 DIAGNOSIS — L814 Other melanin hyperpigmentation: Secondary | ICD-10-CM

## 2023-01-28 DIAGNOSIS — L820 Inflamed seborrheic keratosis: Secondary | ICD-10-CM | POA: Diagnosis not present

## 2023-01-28 DIAGNOSIS — Z85828 Personal history of other malignant neoplasm of skin: Secondary | ICD-10-CM

## 2023-01-28 NOTE — Progress Notes (Signed)
Follow-Up Visit   Subjective  Rhonda [name] is a 89 y.o. female who presents for the following: Skin Cancer Screening and Full Body Skin Exam  The patient presents for Total-Body Skin Exam (TBSE) for skin cancer screening and mole check. The patient has spots, moles and lesions to be evaluated, some may be new or changing and the patient may have concern these could be cancer.   The following portions of the chart were reviewed this encounter and updated as appropriate: medications, allergies, medical history  Review of Systems:  No other skin or systemic complaints except as noted in HPI or Assessment and Plan.  Objective  Well appearing patient in no apparent distress; mood and affect are within normal limits.  A full examination was performed including scalp, head, eyes, ears, nose, lips, neck, chest, axillae, abdomen, back, buttocks, bilateral upper extremities, bilateral lower extremities, hands, feet, fingers, toes, fingernails, and toenails. All findings within normal limits unless otherwise noted below.   Relevant physical exam findings are noted in the Assessment and Plan.  Arms, legs, face x 17 (17) Erythematous thin papules/macules with gritty scale.   Arms, legs, and face x 5 (5) Erythematous stuck-on, waxy papule or plaque    Assessment & Plan   SKIN CANCER SCREENING PERFORMED TODAY.  ACTINIC DAMAGE - Chronic condition, secondary to cumulative UV/sun exposure - diffuse scaly erythematous macules with underlying dyspigmentation - Recommend daily broad spectrum sunscreen SPF 30+ to sun-exposed areas, reapply every 2 hours as needed.  - Staying in the shade or wearing long sleeves, sun glasses (UVA+UVB protection) and wide brim hats (4-inch brim around the entire circumference of the hat) are also recommended for sun protection.  - Call for new or changing lesions.  LENTIGINES, SEBORRHEIC KERATOSES, HEMANGIOMAS - Benign normal skin lesions - Benign-appearing -  Call for any changes  MELANOCYTIC NEVI - Tan-brown and/or pink-flesh-colored symmetric macules and papules - Benign appearing on exam today - Observation - Call clinic for new or changing moles - Recommend daily use of broad spectrum spf 30+ sunscreen to sun-exposed areas.   HISTORY OF MELANOMA - No evidence of recurrence today - No lymphadenopathy - Recommend regular full body skin exams - Recommend daily broad spectrum sunscreen SPF 30+ to sun-exposed areas, reapply every 2 hours as needed.  - Call if any new or changing lesions are noted between office visits  HISTORY OF SQUAMOUS CELL CARCINOMA OF THE SKIN - No evidence of recurrence today - No lymphadenopathy - Recommend regular full body skin exams - Recommend daily broad spectrum sunscreen SPF 30+ to sun-exposed areas, reapply every 2 hours as needed.  - Call if any new or changing lesions are noted between office visits  AK (actinic keratosis) (17) Arms, legs, face x 17  Destruction of lesion - Arms, legs, face x 17 (17) Complexity: simple   Destruction method: cryotherapy   Informed consent: discussed and consent obtained   Timeout:  patient name, date of birth, surgical site, and procedure verified Lesion destroyed using liquid nitrogen: Yes   Region frozen until ice ball extended beyond lesion: Yes   Outcome: patient tolerated procedure well with no complications   Post-procedure details: wound care instructions given    Inflamed seborrheic keratosis (5) Arms, legs, and face x 5  Symptomatic, irritating, patient would like treated.   Destruction of lesion - Arms, legs, and face x 5 (5) Complexity: simple   Destruction method: cryotherapy   Informed consent: discussed and consent obtained  Timeout:  patient name, date of birth, surgical site, and procedure verified Lesion destroyed using liquid nitrogen: Yes   Region frozen until ice ball extended beyond lesion: Yes   Outcome: patient tolerated procedure  well with no complications   Post-procedure details: wound care instructions given     Return in about 6 months (around 07/31/2023) for TBSE.  Maylene Roes, CMA, am acting as scribe for Armida Sans, MD .   Documentation: I have reviewed the above documentation for accuracy and completeness, and I agree with the above.  Armida Sans, MD

## 2023-01-28 NOTE — Patient Instructions (Signed)

## 2023-02-04 ENCOUNTER — Encounter: Payer: Self-pay | Admitting: Dermatology

## 2023-02-13 ENCOUNTER — Other Ambulatory Visit: Payer: Self-pay | Admitting: Oncology

## 2023-02-13 DIAGNOSIS — C9000 Multiple myeloma not having achieved remission: Secondary | ICD-10-CM

## 2023-02-14 ENCOUNTER — Encounter: Payer: Self-pay | Admitting: Oncology

## 2023-02-28 ENCOUNTER — Other Ambulatory Visit: Payer: Medicare PPO

## 2023-02-28 ENCOUNTER — Inpatient Hospital Stay: Payer: Medicare PPO | Attending: Oncology

## 2023-02-28 DIAGNOSIS — C9000 Multiple myeloma not having achieved remission: Secondary | ICD-10-CM

## 2023-02-28 DIAGNOSIS — C9002 Multiple myeloma in relapse: Secondary | ICD-10-CM | POA: Diagnosis not present

## 2023-02-28 DIAGNOSIS — G629 Polyneuropathy, unspecified: Secondary | ICD-10-CM | POA: Diagnosis not present

## 2023-02-28 LAB — COMPREHENSIVE METABOLIC PANEL
ALT: 19 U/L (ref 0–44)
AST: 21 U/L (ref 15–41)
Albumin: 3.9 g/dL (ref 3.5–5.0)
Alkaline Phosphatase: 57 U/L (ref 38–126)
Anion gap: 8 (ref 5–15)
BUN: 17 mg/dL (ref 8–23)
CO2: 24 mmol/L (ref 22–32)
Calcium: 9.3 mg/dL (ref 8.9–10.3)
Chloride: 107 mmol/L (ref 98–111)
Creatinine, Ser: 0.82 mg/dL (ref 0.44–1.00)
GFR, Estimated: >60 mL/min (ref >60)
Glucose, Bld: 145 mg/dL — ABNORMAL HIGH (ref 70–99)
Potassium: 3.7 mmol/L (ref 3.5–5.1)
Sodium: 139 mmol/L (ref 135–145)
Total Bilirubin: 0.7 mg/dL (ref 0.3–1.2)
Total Protein: 6.6 g/dL (ref 6.5–8.1)

## 2023-02-28 LAB — CBC WITH DIFFERENTIAL/PLATELET
Abs Immature Granulocytes: 0.01 10*3/uL (ref 0.00–0.07)
Basophils Absolute: 0.1 10*3/uL (ref 0.0–0.1)
Basophils Relative: 3 %
Eosinophils Absolute: 0 10*3/uL (ref 0.0–0.5)
Eosinophils Relative: 1 %
HCT: 40.2 % (ref 36.0–46.0)
Hemoglobin: 13.2 g/dL (ref 12.0–15.0)
Immature Granulocytes: 0 %
Lymphocytes Relative: 28 %
Lymphs Abs: 1.2 10*3/uL (ref 0.7–4.0)
MCH: 32.4 pg (ref 26.0–34.0)
MCHC: 32.8 g/dL (ref 30.0–36.0)
MCV: 98.8 fL (ref 80.0–100.0)
Monocytes Absolute: 0.9 10*3/uL (ref 0.1–1.0)
Monocytes Relative: 22 %
Neutro Abs: 2.1 10*3/uL (ref 1.7–7.7)
Neutrophils Relative %: 46 %
Platelets: 205 10*3/uL (ref 150–400)
RBC: 4.07 MIL/uL (ref 3.87–5.11)
RDW: 13.2 % (ref 11.5–15.5)
WBC: 4.4 10*3/uL (ref 4.0–10.5)
nRBC: 0 % (ref 0.0–0.2)

## 2023-03-01 LAB — KAPPA/LAMBDA LIGHT CHAINS
Kappa free light chain: 23.2 mg/L — ABNORMAL HIGH (ref 3.3–19.4)
Kappa, lambda light chain ratio: 0.07 — ABNORMAL LOW (ref 0.26–1.65)
Lambda free light chains: 345.6 mg/L — ABNORMAL HIGH (ref 5.7–26.3)

## 2023-03-02 LAB — IGG, IGA, IGM
IgA: 107 mg/dL (ref 64–422)
IgG (Immunoglobin G), Serum: 695 mg/dL (ref 586–1602)
IgM (Immunoglobulin M), Srm: 29 mg/dL (ref 26–217)

## 2023-03-04 LAB — IMMUNOFIXATION ELECTROPHORESIS
IgA: 96 mg/dL (ref 64–422)
IgG (Immunoglobin G), Serum: 710 mg/dL (ref 586–1602)
IgM (Immunoglobulin M), Srm: 24 mg/dL — ABNORMAL LOW (ref 26–217)
Total Protein ELP: 6 g/dL (ref 6.0–8.5)

## 2023-03-07 ENCOUNTER — Other Ambulatory Visit: Payer: Medicare PPO

## 2023-03-07 ENCOUNTER — Inpatient Hospital Stay: Payer: Medicare PPO | Admitting: Oncology

## 2023-03-07 ENCOUNTER — Inpatient Hospital Stay: Payer: Medicare PPO

## 2023-03-07 ENCOUNTER — Inpatient Hospital Stay: Payer: Medicare PPO | Admitting: Pharmacist

## 2023-03-11 ENCOUNTER — Telehealth: Payer: Self-pay

## 2023-03-11 ENCOUNTER — Encounter: Payer: Self-pay | Admitting: Nurse Practitioner

## 2023-03-11 ENCOUNTER — Telehealth: Payer: Medicare PPO | Admitting: Nurse Practitioner

## 2023-03-11 VITALS — Ht 60.0 in | Wt 125.0 lb

## 2023-03-11 DIAGNOSIS — U071 COVID-19: Secondary | ICD-10-CM | POA: Diagnosis not present

## 2023-03-11 DIAGNOSIS — J069 Acute upper respiratory infection, unspecified: Secondary | ICD-10-CM | POA: Diagnosis not present

## 2023-03-11 MED ORDER — PROMETHAZINE-DM 6.25-15 MG/5ML PO SYRP: 5.0000 mL | ORAL_SOLUTION | Freq: Four times a day (QID) | ORAL | 0 refills | Status: DC | PRN

## 2023-03-11 MED ORDER — PROMETHAZINE-CODEINE 6.25-10 MG/5ML PO SYRP: 10.0000 mL | ORAL_SOLUTION | Freq: Four times a day (QID) | ORAL | 0 refills | Status: DC | PRN

## 2023-03-11 MED ORDER — PROMETHAZINE-PHENYLEPHRINE 6.25-5 MG/5ML PO SYRP: 5.0000 mL | ORAL_SOLUTION | ORAL | 0 refills | Status: DC | PRN

## 2023-03-11 NOTE — Progress Notes (Signed)
[facility] [address] [place], [place] [address]  Internal MEDICINE  Telephone Visit  Patient Name: Rhonda [name]  [id number]  [id number]  Date of Service: 03/11/2023  I connected with the patient at 0900 by telephone and verified the patients identity using two identifiers.   I discussed the limitations, risks, security and privacy concerns of performing an evaluation and management service by telephone and the availability of in person appointments. I also discussed with the patient that there may be a patient responsible charge related to the service.  The patient expressed understanding and agrees to proceed.    Chief Complaint  Patient presents with   Telephone Assessment   Telephone Screen    Covid test  positive symptoms start thursday   Sinusitis   Cough    HPI Jalicia presents for a telehealth virtual visit for covid positive URI.  Covid test was positive and symptoms started about 4 days ago on Thursday last week.  Reports nasal congestion, cough, headache, sinus drainage.    Current Medication: Outpatient Encounter Medications as of 03/11/2023  Medication Sig Note   aspirin EC 81 MG tablet Take 81 mg by mouth daily.    Cyanocobalamin (B-12 PO) Take 500 mcg by mouth daily.     EPINEPHrine 0.3 mg/0.3 mL IJ SOAJ injection  08/13/2022: Once a week.   fexofenadine (ALLEGRA) 180 MG tablet Take 180 mg by mouth daily.    levothyroxine (SYNTHROID) 50 MCG tablet TAKE 1 TABLET BY MOUTH DAILY BEFORE BREAKFAST    Multiple Vitamins-Minerals (CENTRUM SILVER PO) Take 1 tablet by mouth daily.    POMALYST 2 MG capsule TAKE 1 CAPSULE BY MOUTH EVERY DAY FOR 21 DAYS ON THEN 7 DAYS OFF, REPEAT EVERY 28 DAYS    potassium chloride SA (KLOR-CON M) 20 MEQ tablet Take 1 tablet (20 mEq total) by mouth daily.    predniSONE (DELTASONE) 10 MG tablet Take one tab 3 x day for 3 days, then take one tab 2 x a day for 3 days and then take one tab a day for 3 days for copd     promethazine-dextromethorphan (PROMETHAZINE-DM) 6.25-15 MG/5ML syrup Take 5 mLs by mouth 4 (four) times daily as needed for cough.    triamcinolone (NASACORT) 55 MCG/ACT AERO nasal inhaler Place 2 sprays into the nose 2 (two) times daily as needed.    [DISCONTINUED] promethazine-codeine (PHENERGAN WITH CODEINE) 6.25-10 MG/5ML syrup Take 10 mLs by mouth every 6 (six) hours as needed for cough.    [DISCONTINUED] promethazine-phenylephrine 6.25-5 MG/5ML SYRP Take 5-10 mLs by mouth every 4 (four) hours as needed for congestion.    Facility-Administered Encounter Medications as of 03/11/2023  Medication   0.9 %  sodium chloride infusion    Surgical History: Past Surgical History:  Procedure Laterality Date   BREAST EXCISIONAL BIOPSY Right 15+ yrs ago   EXCISIONAL - NEG   CATARACT EXTRACTION W/ INTRAOCULAR LENS IMPLANT     CHOLECYSTECTOMY N/A 03/14/2017   Procedure: LAPAROSCOPIC CHOLECYSTECTOMY;  Surgeon: Lattie Haw, MD;  Location: [facility];  Service: General;  Laterality: N/A;   COLONOSCOPY WITH PROPOFOL N/A 02/21/2015   Procedure: COLONOSCOPY WITH PROPOFOL;  Surgeon: Midge Minium, MD;  Location: [facility];  Service: Endoscopy;  Laterality: N/A;   LAPAROSCOPIC HYSTERECTOMY  1980   POLYPECTOMY  02/21/2015   Procedure: POLYPECTOMY;  Surgeon: Midge Minium, MD;  Location: [facility];  Service: Endoscopy;;    Medical History: Past Medical History:  Diagnosis Date  Actinic keratosis    Arthritis    hands   Benign neoplasm of ascending colon    Cataract    GERD (gastroesophageal reflux disease)    Hyperlipemia    Hypertension 01/31/2015   Hypothyroidism    Melanoma (HCC) 04/28/2018   R mid to distal ant lat thigh. MM, SS, tumor thickness 0.3mm, antatomic level II. Excised: 05/13/18, margins free   Multiple myeloma (HCC) 04/18/2016   Multiple myeloma in remission (HCC) 04/18/2016   Osteoporosis    Shingles    Skin cancer    basal cell   Squamous cell carcinoma of  skin 02/04/2014   Left pretibial. KA-like pattern   Squamous cell carcinoma of skin 09/15/2015   Right lat. superior ankle are. Superficial infiltration. Tx: [facility]   Squamous cell carcinoma of skin 10/15/2016   Left medial mid lower leg. Tx: [facility]   Squamous cell carcinoma of skin 11/27/2018   Right dorsum proximal forearm.   Squamous cell carcinoma of skin 09/09/2019   Right cheek lat. to mid nasolabial area. Tx: [facility]   Squamous cell carcinoma of skin 12/09/2019   right pretibial below knee   Squamous cell carcinoma of skin 06/25/2022   Left ant forearm - [facility]    Family History: Family History  Problem Relation Age of Onset   Heart disease Mother    Stroke Father    Heart disease Brother    Kidney cancer Neg Hx    Prostate cancer Neg Hx    Bladder Cancer Neg Hx    Breast cancer Neg Hx     Social History   Socioeconomic History   Marital status: Widowed    Spouse name: Not on file   Number of children: Not on file   Years of education: Not on file   Highest education level: Not on file  Occupational History   Not on file  Tobacco Use   Smoking status: Never   Smokeless tobacco: Never  Vaping Use   Vaping status: Never Used  Substance and Sexual Activity   Alcohol use: No    Alcohol/week: 0.0 standard drinks of alcohol   Drug use: No   Sexual activity: Not on file  Other Topics Concern   Not on file  Social History Narrative   Not on file   Social Determinants of Health   Financial Resource Strain: Not on file  Food Insecurity: Not on file  Transportation Needs: Not on file  Physical Activity: Not on file  Stress: Not on file  Social Connections: Not on file  Intimate Partner Violence: Not on file      Review of Systems  Constitutional: Negative.  Negative for fever.  HENT:  Positive for congestion, postnasal drip, rhinorrhea and sneezing.   Respiratory:  Positive for cough and shortness of breath. Negative for chest tightness and wheezing.    Cardiovascular: Negative.  Negative for chest pain and palpitations.  Neurological:  Positive for headaches.    Vital Signs: Ht 5' (1.524 m)   Wt 125 lb (56.7 kg)   BMI 24.41 kg/m    Observation/Objective: She is alert and oriented. No acute distress noted.     Assessment/Plan: 1. Upper respiratory tract infection due to COVID-19 virus Discussed prescribing the antiviral but decided to focus on symptom management since it has been 4 days since her symptoms started. Medication prescribed for cough and congestion.  - promethazine-dextromethorphan (PROMETHAZINE-DM) 6.25-15 MG/5ML syrup; Take 5 mLs by mouth 4 (four) times daily as needed for cough.  Dispense: 118 mL; Refill: 0   General Counseling: Omnia verbalizes understanding of the findings of today's phone visit and agrees with plan of treatment. I have discussed any further diagnostic evaluation that may be needed or ordered today. We also reviewed her medications today. she has been encouraged to call the office with any questions or concerns that should arise related to todays visit.  Return if symptoms worsen or fail to improve, for upcoming annual wellness visit. .   No orders of the defined types were placed in this encounter.   Meds ordered this encounter  Medications   DISCONTD: promethazine-phenylephrine 6.25-5 MG/5ML SYRP    Sig: Take 5-10 mLs by mouth every 4 (four) hours as needed for congestion.    Dispense:  120 mL    Refill:  0    Fill now please   DISCONTD: promethazine-codeine (PHENERGAN WITH CODEINE) 6.25-10 MG/5ML syrup    Sig: Take 10 mLs by mouth every 6 (six) hours as needed for cough.    Dispense:  118 mL    Refill:  0   promethazine-dextromethorphan (PROMETHAZINE-DM) 6.25-15 MG/5ML syrup    Sig: Take 5 mLs by mouth 4 (four) times daily as needed for cough.    Dispense:  118 mL    Refill:  0    Discontinue previous orders and fill this script    Time spent:10 Minutes Time spent with patient  included reviewing progress notes, labs, imaging studies, and discussing plan for follow up.  [place] Controlled Substance Database was reviewed by me for overdose risk score (ORS) if appropriate.  This patient was seen by Sallyanne Kuster, FNP-C in collaboration with Dr. Beverely Risen as a part of collaborative care agreement.  Balen Woolum R. Tedd Sias, MSN, FNP-C Internal medicine

## 2023-03-11 NOTE — Telephone Encounter (Signed)
[facility] called they don't have promethazine -phenylephrine as per alyssa cancel pres  and alyssa will sent alternative

## 2023-03-14 ENCOUNTER — Other Ambulatory Visit: Payer: Self-pay | Admitting: Oncology

## 2023-03-14 ENCOUNTER — Encounter: Payer: Self-pay | Admitting: Oncology

## 2023-03-14 ENCOUNTER — Inpatient Hospital Stay: Payer: Medicare PPO

## 2023-03-14 ENCOUNTER — Inpatient Hospital Stay: Payer: Medicare PPO | Admitting: Pharmacist

## 2023-03-14 ENCOUNTER — Inpatient Hospital Stay: Payer: Medicare PPO | Attending: Oncology | Admitting: Oncology

## 2023-03-14 VITALS — BP 129/55 | HR 60 | Temp 98.0°F | Resp 17

## 2023-03-14 VITALS — BP 118/76 | HR 62 | Temp 98.1°F | Resp 16 | Ht 60.0 in | Wt 128.0 lb

## 2023-03-14 DIAGNOSIS — Z85828 Personal history of other malignant neoplasm of skin: Secondary | ICD-10-CM | POA: Insufficient documentation

## 2023-03-14 DIAGNOSIS — Z85820 Personal history of malignant melanoma of skin: Secondary | ICD-10-CM | POA: Insufficient documentation

## 2023-03-14 DIAGNOSIS — Z87440 Personal history of urinary (tract) infections: Secondary | ICD-10-CM | POA: Diagnosis not present

## 2023-03-14 DIAGNOSIS — Z79899 Other long term (current) drug therapy: Secondary | ICD-10-CM | POA: Diagnosis not present

## 2023-03-14 DIAGNOSIS — C9000 Multiple myeloma not having achieved remission: Secondary | ICD-10-CM

## 2023-03-14 DIAGNOSIS — G629 Polyneuropathy, unspecified: Secondary | ICD-10-CM | POA: Insufficient documentation

## 2023-03-14 DIAGNOSIS — C9002 Multiple myeloma in relapse: Secondary | ICD-10-CM | POA: Insufficient documentation

## 2023-03-14 DIAGNOSIS — I10 Essential (primary) hypertension: Secondary | ICD-10-CM | POA: Insufficient documentation

## 2023-03-14 DIAGNOSIS — R197 Diarrhea, unspecified: Secondary | ICD-10-CM | POA: Diagnosis not present

## 2023-03-14 MED ORDER — ZOLEDRONIC ACID 4 MG/5ML IV CONC
3.0000 mg | Freq: Once | INTRAVENOUS | Status: AC
  Administered 2023-03-14: 3 mg via INTRAVENOUS
  Filled 2023-03-14: qty 3.75

## 2023-03-14 MED ORDER — SODIUM CHLORIDE 0.9 % IV SOLN
Freq: Once | INTRAVENOUS | Status: AC
  Filled 2023-03-14: qty 250

## 2023-03-14 NOTE — Progress Notes (Signed)
[facility]ral [facility]  Telephone:([phone] Fax:([phone]  Patient Care Team: Sallyanne Kuster, NP as PCP - General (Nurse Practitioner) Jeralyn Ruths, MD as Consulting Physician (Oncology)   Name of the patient: Rhonda [name]  [id number]  1933-12-04   Date of visit: 03/14/23  HPI: Patient is a 89 y.o. female with multiple myeloma. Currently on treatment with pomalidomide.   Reason for Consult: Oral chemotherapy follow-up for pomalidomide therapy.   PAST MEDICAL HISTORY: Past Medical History:  Diagnosis Date   Actinic keratosis    Arthritis    hands   Benign neoplasm of ascending colon    Cataract    GERD (gastroesophageal reflux disease)    Hyperlipemia    Hypertension 01/31/2015   Hypothyroidism    Melanoma (HCC) 04/28/2018   R mid to distal ant lat thigh. MM, SS, tumor thickness 0.105mm, antatomic level II. Excised: 05/13/18, margins free   Multiple myeloma (HCC) 04/18/2016   Multiple myeloma in remission (HCC) 04/18/2016   Osteoporosis    Shingles    Skin cancer    basal cell   Squamous cell carcinoma of skin 02/04/2014   Left pretibial. KA-like pattern   Squamous cell carcinoma of skin 09/15/2015   Right lat. superior ankle are. Superficial infiltration. Tx: EDC   Squamous cell carcinoma of skin 10/15/2016   Left medial mid lower leg. Tx: EDC   Squamous cell carcinoma of skin 11/27/2018   Right dorsum proximal forearm.   Squamous cell carcinoma of skin 09/09/2019   Right cheek lat. to mid nasolabial area. Tx: EDC   Squamous cell carcinoma of skin 12/09/2019   right pretibial below knee   Squamous cell carcinoma of skin 06/25/2022   Left ant forearm - EDC    HEMATOLOGY/ONCOLOGY HISTORY:  Oncology History  Multiple myeloma (HCC)  04/18/2016 Initial Diagnosis   Multiple myeloma (HCC)     ALLERGIES:  is allergic to revlimid [lenalidomide].  MEDICATIONS:  Current Outpatient Medications  Medication Sig  Dispense Refill   aspirin EC 81 MG tablet Take 81 mg by mouth daily.     Cyanocobalamin (B-12 PO) Take 500 mcg by mouth daily.      EPINEPHrine 0.3 mg/0.3 mL IJ SOAJ injection      fexofenadine (ALLEGRA) 180 MG tablet Take 180 mg by mouth daily.     levothyroxine (SYNTHROID) 50 MCG tablet TAKE 1 TABLET BY MOUTH DAILY BEFORE BREAKFAST 90 tablet 3   Multiple Vitamins-Minerals (CENTRUM SILVER PO) Take 1 tablet by mouth daily.     POMALYST 2 MG capsule TAKE 1 CAPSULE BY MOUTH EVERY DAY FOR 21 DAYS ON THEN 7 DAYS OFF, REPEAT EVERY 28 DAYS 21 capsule 0   potassium chloride SA (KLOR-CON M) 20 MEQ tablet Take 1 tablet (20 mEq total) by mouth daily. 6 tablet 0   predniSONE (DELTASONE) 10 MG tablet Take one tab 3 x day for 3 days, then take one tab 2 x a day for 3 days and then take one tab a day for 3 days for copd (Patient not taking: Reported on 03/14/2023) 18 tablet 0   promethazine-dextromethorphan (PROMETHAZINE-DM) 6.25-15 MG/5ML syrup Take 5 mLs by mouth 4 (four) times daily as needed for cough. 118 mL 0   triamcinolone (NASACORT) 55 MCG/ACT AERO nasal inhaler Place 2 sprays into the nose 2 (two) times daily as needed.     No current facility-administered medications for this visit.   Facility-Administered Medications Ordered in Other Visits  Medication Dose Route Frequency  Provider Last Rate Last Admin   0.9 %  sodium chloride infusion   Intravenous Continuous Jeralyn Ruths, MD 10 mL/hr at 06/07/22 1358 New Bag at 06/07/22 1358    VITAL SIGNS: There were no vitals taken for this visit. There were no vitals filed for this visit.  Estimated body mass index is 25 kg/m as calculated from the following:   Height as of an earlier encounter on 03/14/23: 5' (1.524 m).   Weight as of an earlier encounter on 03/14/23: 58.1 kg (128 lb).  LABS: CBC:    Component Value Date/Time   WBC 4.4 02/28/2023 1335   HGB 13.2 02/28/2023 1335   HCT 40.2 02/28/2023 1335   PLT 205 02/28/2023 1335   MCV  98.8 02/28/2023 1335   NEUTROABS 2.1 02/28/2023 1335   LYMPHSABS 1.2 02/28/2023 1335   MONOABS 0.9 02/28/2023 1335   EOSABS 0.0 02/28/2023 1335   BASOSABS 0.1 02/28/2023 1335   Comprehensive Metabolic Panel:    Component Value Date/Time   NA 139 02/28/2023 1335   NA 138 07/24/2022 1044   K 3.7 02/28/2023 1335   CL 107 02/28/2023 1335   CO2 24 02/28/2023 1335   BUN 17 02/28/2023 1335   BUN 19 07/24/2022 1044   CREATININE 0.82 02/28/2023 1335   GLUCOSE 145 (H) 02/28/2023 1335   CALCIUM 9.3 02/28/2023 1335   AST 21 02/28/2023 1335   ALT 19 02/28/2023 1335   ALKPHOS 57 02/28/2023 1335   BILITOT 0.7 02/28/2023 1335   BILITOT 0.5 07/24/2022 1044   PROT 6.6 02/28/2023 1335   PROT 6.6 07/24/2022 1044   ALBUMIN 3.9 02/28/2023 1335   ALBUMIN 4.5 07/24/2022 1044     Present during today's visit: patient only  Assessment and Plan: Reviewed CBC/CMP/light chains/Ig with patient, continue pomalidomide 2mg  21on/7off She believes that she has about 1 week left of taking pomalidomide before her off week Okay to receive Zometa today   Oral Chemotherapy Side Effect/Intolerance:  Bowel movement changes: patient reports she normally has constipation while taking the pomalidomide and diarrhea during her week off Of note patient reports not hearing back from GI office on the stool sample they tested on 01/21/23. Reviewed lab and there is a results lab from Dr. Servando Snare that states "Please let the patient know that she has a E. coli infection which will resolve on its own and does not need any specific medication." Patient informed of Dr. Annabell Sabal response.   Oral Chemotherapy Adherence: no missed doses reported No patient barriers to medication adherence identified.   New medications: none reported  Medication Access Issues: No issues, patient fills at [facility]  Patient expressed understanding and was in agreement with this plan. She also understands that She can call clinic at any  time with any questions, concerns, or complaints.   Follow-up plan: RTC 3 months: see MD, Zometa , labs 1 week prior  Thank you for allowing me to participate in the care of this very pleasant patient.   Time Total: 15 mins  Visit consisted of counseling and education on dealing with issues of symptom management in the setting of serious and potentially life-threatening illness.Greater than 50%  of this time was spent counseling and coordinating care related to the above assessment and plan.  Signed by: Remi Haggard, PharmD, BCPS, Nolon Bussing, CPP Hematology/Oncology Clinical Pharmacist Practitioner [place]/DB/AP Oral Chemotherapy [facility] [phone]  03/14/2023 4:37 PM

## 2023-03-14 NOTE — Progress Notes (Signed)
[facility]  Telephone:([phone] Fax:(336) [phone]  ID: Lenise Arena OB: Feb 01, 1934  MR#: [id number]  AOZ#:[id number]  Patient Care Team: Sallyanne Kuster, NP as PCP - General (Nurse Practitioner) Jeralyn Ruths, MD as Consulting Physician (Oncology)   CHIEF COMPLAINT:  Multiple myeloma in relapse.  INTERVAL HISTORY: Patient returns to clinic today for repeat laboratory work, further evaluation, and continuation of treatment.  She recently has had problems with diarrhea, but is being evaluated by GI.  She otherwise feels well and is tolerating her treatments without significant side effects. She has a chronic peripheral neuropathy, but no other neurologic complaints.  She denies any fevers. She has a good appetite and denies weight loss.  She denies any chest pain, shortness of breath, cough, or hemoptysis.  She denies any nausea, vomiting, or constipation. She has no melena or hematochezia.  She has no urinary complaints.  Patient offers no further specific complaints today.  REVIEW OF SYSTEMS:   Review of Systems  Constitutional: Negative.  Negative for fever, malaise/fatigue and weight loss.  HENT:  Negative for congestion and sore throat.   Respiratory: Negative.  Negative for cough and shortness of breath.   Cardiovascular: Negative.  Negative for chest pain, palpitations and leg swelling.  Gastrointestinal:  Positive for diarrhea. Negative for abdominal pain, blood in stool, constipation, melena, nausea and vomiting.  Genitourinary: Negative.  Negative for dysuria.  Musculoskeletal: Negative.  Negative for back pain.  Skin: Negative.  Negative for rash.  Neurological:  Positive for tingling and sensory change. Negative for focal weakness and weakness.  Psychiatric/Behavioral: Negative.  The patient is not nervous/anxious and does not have insomnia.     As per HPI. Otherwise, a complete review of systems is negative.  PAST MEDICAL HISTORY: Past  Medical History:  Diagnosis Date   Actinic keratosis    Arthritis    hands   Benign neoplasm of ascending colon    Cataract    GERD (gastroesophageal reflux disease)    Hyperlipemia    Hypertension 01/31/2015   Hypothyroidism    Melanoma (HCC) 04/28/2018   R mid to distal ant lat thigh. MM, SS, tumor thickness 0.87mm, antatomic level II. Excised: 05/13/18, margins free   Multiple myeloma (HCC) 04/18/2016   Multiple myeloma in remission (HCC) 04/18/2016   Osteoporosis    Shingles    Skin cancer    basal cell   Squamous cell carcinoma of skin 02/04/2014   Left pretibial. KA-like pattern   Squamous cell carcinoma of skin 09/15/2015   Right lat. superior ankle are. Superficial infiltration. Tx: EDC   Squamous cell carcinoma of skin 10/15/2016   Left medial mid lower leg. Tx: EDC   Squamous cell carcinoma of skin 11/27/2018   Right dorsum proximal forearm.   Squamous cell carcinoma of skin 09/09/2019   Right cheek lat. to mid nasolabial area. Tx: EDC   Squamous cell carcinoma of skin 12/09/2019   right pretibial below knee   Squamous cell carcinoma of skin 06/25/2022   Left ant forearm - EDC    PAST SURGICAL HISTORY: Past Surgical History:  Procedure Laterality Date   BREAST EXCISIONAL BIOPSY Right 15+ yrs ago   EXCISIONAL - NEG   CATARACT EXTRACTION W/ INTRAOCULAR LENS IMPLANT     CHOLECYSTECTOMY N/A 03/14/2017   Procedure: LAPAROSCOPIC CHOLECYSTECTOMY;  Surgeon: Lattie Haw, MD;  Location: [facility];  Service: General;  Laterality: N/A;   COLONOSCOPY WITH PROPOFOL N/A 02/21/2015   Procedure: COLONOSCOPY WITH PROPOFOL;  Surgeon: Midge Minium, MD;  Location: [facility];  Service: Endoscopy;  Laterality: N/A;   LAPAROSCOPIC HYSTERECTOMY  1980   POLYPECTOMY  02/21/2015   Procedure: POLYPECTOMY;  Surgeon: Midge Minium, MD;  Location: [facility];  Service: Endoscopy;;    FAMILY HISTORY: Family History  Problem Relation Age of Onset   Heart disease Mother     Stroke Father    Heart disease Brother    Kidney cancer Neg Hx    Prostate cancer Neg Hx    Bladder Cancer Neg Hx    Breast cancer Neg Hx     ADVANCED DIRECTIVES (Y/N):  N  HEALTH MAINTENANCE: Social History   Tobacco Use   Smoking status: Never   Smokeless tobacco: Never  Vaping Use   Vaping status: Never Used  Substance Use Topics   Alcohol use: No    Alcohol/week: 0.0 standard drinks of alcohol   Drug use: No     Colonoscopy:  PAP:  Bone density:  Lipid panel:  Allergies  Allergen Reactions   Revlimid [Lenalidomide]     Current Outpatient Medications  Medication Sig Dispense Refill   aspirin EC 81 MG tablet Take 81 mg by mouth daily.     Cyanocobalamin (B-12 PO) Take 500 mcg by mouth daily.      EPINEPHrine 0.3 mg/0.3 mL IJ SOAJ injection      fexofenadine (ALLEGRA) 180 MG tablet Take 180 mg by mouth daily.     levothyroxine (SYNTHROID) 50 MCG tablet TAKE 1 TABLET BY MOUTH DAILY BEFORE BREAKFAST 90 tablet 3   Multiple Vitamins-Minerals (CENTRUM SILVER PO) Take 1 tablet by mouth daily.     POMALYST 2 MG capsule TAKE 1 CAPSULE BY MOUTH EVERY DAY FOR 21 DAYS ON THEN 7 DAYS OFF, REPEAT EVERY 28 DAYS 21 capsule 0   potassium chloride SA (KLOR-CON M) 20 MEQ tablet Take 1 tablet (20 mEq total) by mouth daily. 6 tablet 0   promethazine-dextromethorphan (PROMETHAZINE-DM) 6.25-15 MG/5ML syrup Take 5 mLs by mouth 4 (four) times daily as needed for cough. 118 mL 0   triamcinolone (NASACORT) 55 MCG/ACT AERO nasal inhaler Place 2 sprays into the nose 2 (two) times daily as needed.     predniSONE (DELTASONE) 10 MG tablet Take one tab 3 x day for 3 days, then take one tab 2 x a day for 3 days and then take one tab a day for 3 days for copd (Patient not taking: Reported on 03/14/2023) 18 tablet 0   No current facility-administered medications for this visit.   Facility-Administered Medications Ordered in Other Visits  Medication Dose Route Frequency Provider Last Rate Last  Admin   0.9 %  sodium chloride infusion   Intravenous Continuous Jeralyn Ruths, MD 10 mL/hr at 06/07/22 1358 New Bag at 06/07/22 1358   Zoledronic Acid (ZOMETA) IVPB 4 mg  4 mg Intravenous Once Jeralyn Ruths, MD        OBJECTIVE: Vitals:   03/14/23 1324  BP: 118/76  Pulse: 62  Resp: 16  Temp: 98.1 F (36.7 C)  SpO2: 98%     Body mass index is 25 kg/m.    ECOG FS:0 - Asymptomatic  General: Well-developed, well-nourished, no acute distress. Eyes: Pink conjunctiva, anicteric sclera. HEENT: Normocephalic, moist mucous membranes. Lungs: No audible wheezing or coughing. Heart: Regular rate and rhythm. Abdomen: Soft, nontender, no obvious distention. Musculoskeletal: No edema, cyanosis, or clubbing. Neuro: Alert, answering all questions appropriately. Cranial nerves grossly intact. Skin: No rashes  or petechiae noted. Psych: Normal affect.  LAB RESULTS:  Lab Results  Component Value Date   NA 139 02/28/2023   K 3.7 02/28/2023   CL 107 02/28/2023   CO2 24 02/28/2023   GLUCOSE 145 (H) 02/28/2023   BUN 17 02/28/2023   CREATININE 0.82 02/28/2023   CALCIUM 9.3 02/28/2023   PROT 6.6 02/28/2023   ALBUMIN 3.9 02/28/2023   AST 21 02/28/2023   ALT 19 02/28/2023   ALKPHOS 57 02/28/2023   BILITOT 0.7 02/28/2023   GFRNONAA >60 02/28/2023   GFRAA 40 (L) 03/01/2020    Lab Results  Component Value Date   WBC 4.4 02/28/2023   NEUTROABS 2.1 02/28/2023   HGB 13.2 02/28/2023   HCT 40.2 02/28/2023   MCV 98.8 02/28/2023   PLT 205 02/28/2023   Lab Results  Component Value Date   TOTALPROTELP 6.0 02/28/2023   ALBUMINELP 3.5 05/16/2021   A1GS 0.2 05/16/2021   A2GS 0.6 05/16/2021   BETS 0.8 05/16/2021   GAMS 0.5 05/16/2021   MSPIKE Not Observed 05/16/2021   SPEI Comment 05/16/2021     STUDIES: No results found.  ASSESSMENT:  Multiple myeloma in relapse.  PLAN:    Multiple myeloma in relapse: Bone marrow biopsy on April 09, 2016 revealed 44% plasma cells and  normal cytogenetics. Given the results of her metastatic bone survey on March 12, 2016, her elevated lambda free chains, and hypercalcemia, patient fit the criteria for multiple myeloma and was initially treated with single agent Revlimid.  She could not tolerate Revlimid and was switched to pomalidomide 2 mg daily for 21 days with a 7-day break.  Metastatic bone survey on February 12, 2019 revealed new small lesions in her calvarium.  Patient's most recent M spike is now 0.0 and her immunoglobulins remain decreased.  Lambda free light chains have been trending down since March 2021 when Pomalyst was initiated from 5600 to 345.6.  Patient completed 1 year of monthly treatment of Zometa, now only receives treatment every 3 months.  Continue pomalidomide as prescribed.  Return to clinic in 3 months with repeat laboratory work, further evaluation, and continuation of treatment. Peripheral neuropathy: Chronic and changed.  Patient was previously given a referral to acupuncture which she said did not help much.  Hypocalcemia: Resolved.  Continue Zometa as scheduled.   Recurrent UTIs: Patient is currently asymptomatic.  Continue monitoring and treatment per primary care. Diarrhea: Continue follow-up with GI as indicated.  Patient expressed understanding and was in agreement with this plan. She also understands that She can call clinic at any time with any questions, concerns, or complaints.    Jeralyn Ruths, MD 03/14/23 2:35 PM

## 2023-03-15 ENCOUNTER — Telehealth: Payer: Self-pay

## 2023-03-17 ENCOUNTER — Other Ambulatory Visit: Payer: Self-pay | Admitting: Oncology

## 2023-03-17 DIAGNOSIS — C9000 Multiple myeloma not having achieved remission: Secondary | ICD-10-CM

## 2023-03-18 ENCOUNTER — Encounter: Payer: Self-pay | Admitting: Oncology

## 2023-03-21 DIAGNOSIS — H40153 Residual stage of open-angle glaucoma, bilateral: Secondary | ICD-10-CM | POA: Diagnosis not present

## 2023-03-22 NOTE — Telephone Encounter (Signed)
Spoke to patient she stated she has been trying all that but she has an appt on Tuesday and will talk to you then about it.

## 2023-03-26 ENCOUNTER — Ambulatory Visit (INDEPENDENT_AMBULATORY_CARE_PROVIDER_SITE_OTHER): Payer: Medicare PPO | Admitting: Nurse Practitioner

## 2023-03-26 ENCOUNTER — Encounter: Payer: Self-pay | Admitting: Nurse Practitioner

## 2023-03-26 VITALS — BP 128/78 | HR 77 | Temp 98.7°F | Resp 16 | Ht 60.0 in | Wt 127.0 lb

## 2023-03-26 DIAGNOSIS — E782 Mixed hyperlipidemia: Secondary | ICD-10-CM | POA: Diagnosis not present

## 2023-03-26 DIAGNOSIS — R053 Chronic cough: Secondary | ICD-10-CM | POA: Diagnosis not present

## 2023-03-26 DIAGNOSIS — J0100 Acute maxillary sinusitis, unspecified: Secondary | ICD-10-CM | POA: Diagnosis not present

## 2023-03-26 DIAGNOSIS — Z0000 Encounter for general adult medical examination without abnormal findings: Secondary | ICD-10-CM | POA: Diagnosis not present

## 2023-03-26 DIAGNOSIS — Z0001 Encounter for general adult medical examination with abnormal findings: Secondary | ICD-10-CM

## 2023-03-26 DIAGNOSIS — Z23 Encounter for immunization: Secondary | ICD-10-CM

## 2023-03-26 DIAGNOSIS — E039 Hypothyroidism, unspecified: Secondary | ICD-10-CM

## 2023-03-26 DIAGNOSIS — R0602 Shortness of breath: Secondary | ICD-10-CM

## 2023-03-26 MED ORDER — PNEUMOCOCCAL 20-VAL CONJ VACC 0.5 ML IM SUSY: 0.5000 mL | PREFILLED_SYRINGE | INTRAMUSCULAR | 0 refills | Status: AC

## 2023-03-26 MED ORDER — AMOXICILLIN-POT CLAVULANATE 875-125 MG PO TABS
1.0000 | ORAL_TABLET | Freq: Two times a day (BID) | ORAL | 0 refills | Status: AC
Start: 2023-03-26 — End: 2023-04-05

## 2023-03-26 MED ORDER — PREDNISONE 10 MG PO TABS
ORAL_TABLET | ORAL | 0 refills | Status: DC
Start: 2023-03-26 — End: 2023-06-20

## 2023-03-26 MED ORDER — LEVOTHYROXINE SODIUM 50 MCG PO TABS: ORAL_TABLET | ORAL | 3 refills | Status: AC

## 2023-03-26 NOTE — Progress Notes (Signed)
[facility] [address] [place], [place] [address] [phone]  Patient Name: Rhonda [name] DOB: 1933/07/03 MRN: [id number]  Date of Service: 03/26/23   Medicare Annual Wellness Visit (Subsequent)  PCP: Sallyanne Kuster, NP Gastroenterologist: Dr. Midge Minium Heme/Onc: Dr. Gerarda Fraction Dermatologist: Dr. Armida Sans    Subjective:  CC -- Medicare Annual wellness visit.  Additional concerns? yes Patient reports Cough, runny nose, sinus drainage, sore throat, SOB, slight wheezing, sinus pressure, nasal congestion, low grade fever, fatigue, chills. Had covid about 4 weeks ago     General Healthcare: Medication Compliance: yes  Aspirin: yes  Dx Hypertension: no   Dx Hyperlipidemia: yes, last lipid panel was done in 2020.   Diabetes: no  Dx Obesity: no  Weight Loss: no  Urinary Incontinence: no      Social Determinants of Health: SDOH Screenings   Food Insecurity: No Food Insecurity (03/26/2023)  Housing: Low Risk  (03/26/2023)  Transportation Needs: No Transportation Needs (03/26/2023)  Utilities: Not At Risk (03/26/2023)  Alcohol Screen: Low Risk  (03/26/2023)  Depression (PHQ2-9): Medium Risk (03/26/2023)  Financial Resource Strain: Low Risk  (03/26/2023)  Physical Activity: Insufficiently Active (03/26/2023)  Social Connections: Moderately Isolated (03/26/2023)  Stress: Stress Concern Present (03/26/2023)  Tobacco Use: Low Risk  (03/26/2023)  Health Literacy: Adequate Health Literacy (03/26/2023)     Where does the patient live? Lives independently Does the patient drive? yes Family support: yes  Community support: yes Spiritual beliefs: christian  Advance Directives?  Yes  Employment: retired  [employer] level: business college for 2 years Able to read and write: yes    Cancer:  Colorectal >> Colonoscopy: aged out  Lung >> Tobacco Use: none  Breast >> Mammogram: no  Cervical/Endometrial >>  - Postmenopausal:  yes - Hysterectomy: no  - Vaginal Bleeding: no - Pap Smear: no  Skin >> Suspicious lesions: no, gets TBSE from dermatology    Other: Osteoporosis: yes, done in 2016  Zoster Vaccine: no, declined Flu Vaccine: no, declined   RSV Vaccine: no, discussed, will consider  Pneumonia Vaccine: no, due now, patient agreeable, will order today      03/26/2023   10:04 AM 03/20/2022   10:03 AM 03/14/2021   10:20 AM  MMSE - Mini Mental State Exam  Orientation to time 5 5 5   Orientation to Place 5 5 5   Registration 3 3 3   Attention/ Calculation 5 5 5   Recall 3 3 3   Language- name 2 objects 2 2 2   Language- repeat 1 1 1   Language- follow 3 step command 3 3 3   Language- read & follow direction 1 1 1   Write a sentence 1 1 1   Copy design 1 1 1   Total score 30 30 30     Functional Status Survey: Is the patient deaf or have difficulty hearing?: Yes Does the patient have difficulty seeing, even when wearing glasses/contacts?: No Does the patient have difficulty concentrating, remembering, or making decisions?: No Does the patient have difficulty walking or climbing stairs?: No Does the patient have difficulty dressing or bathing?: No Does the patient have difficulty doing errands alone such as visiting a doctor's office or shopping?: No      03/06/2022    1:47 PM 03/20/2022   10:01 AM 06/07/2022    1:32 PM 06/07/2022    2:00 PM 03/26/2023   10:01 AM  Fall Risk  Falls in the past year?  0   0  Was there an injury with Fall?  0   0  Fall Risk Category Calculator  0   0  Fall Risk Category (Retired)  Low     (RETIRED) Patient Fall Risk Level Low fall risk Low fall risk Low fall risk Low fall risk   Patient at Risk for Falls Due to  No Fall Risks   No Fall Risks  Fall risk Follow up  Falls evaluation completed   Falls evaluation completed        03/26/2023   10:01 AM  Depression screen PHQ 2/9  Decreased Interest 3  Down, Depressed, Hopeless 0  PHQ - 2 Score 3  Altered sleeping 0   Tired, decreased energy 3  Change in appetite 1  Feeling bad or failure about yourself  0  Trouble concentrating 0  Moving slowly or fidgety/restless 0  Suicidal thoughts 0  PHQ-9 Score 7  Difficult doing work/chores Not difficult at all         Review of Systems  Constitutional:  Negative for chills, diaphoresis, fever, malaise/fatigue and weight loss.  Respiratory:  Positive for cough and shortness of breath.   Cardiovascular: Negative.   Gastrointestinal:  Negative for constipation, diarrhea, nausea and vomiting.  Genitourinary: Negative.  Negative for dysuria.  Musculoskeletal:  Positive for joint pain.  Skin: Negative.  Negative for itching and rash.  Neurological: Negative.   Psychiatric/Behavioral: Negative.       Past Medical History Patient Active Problem List   Diagnosis Date Noted   Fever and chills 06/18/2018   Acute upper respiratory infection 06/18/2018   Acute non-recurrent maxillary sinusitis 02/12/2018   Allergic rhinitis due to allergen 02/12/2018   Infectious diarrhea 10/09/2017   Calculus of gallbladder without cholecystitis without obstruction    Multiple myeloma (HCC) 04/18/2016   Hypercalcemia 02/28/2016   Abnormal findings-gastrointestinal tract    Benign neoplasm of ascending colon    GERD (gastroesophageal reflux disease) 01/31/2015   Arthritis 01/31/2015   Essential hypertension 01/31/2015   Hyperlipidemia 01/31/2015   Osteoporosis 01/31/2015   Hypothyroidism 01/31/2015    Medications- reviewed and updated Current Outpatient Medications  Medication Sig Dispense Refill   amoxicillin-clavulanate (AUGMENTIN) 875-125 MG tablet Take 1 tablet by mouth 2 (two) times daily for 10 days. Take with food 20 tablet 0   aspirin EC 81 MG tablet Take 81 mg by mouth daily.     Cyanocobalamin (B-12 PO) Take 500 mcg by mouth daily.      EPINEPHrine 0.3 mg/0.3 mL IJ SOAJ injection      fexofenadine (ALLEGRA) 180 MG tablet Take 180 mg by mouth daily.      Multiple Vitamins-Minerals (CENTRUM SILVER PO) Take 1 tablet by mouth daily.     POMALYST 2 MG capsule TAKE 1 CAPSULE BY MOUTH EVERY DAY FOR 21 DAYS ON THEN 7 DAYS OFF, REPEAT EVERY 28 DAYS 21 capsule 0   potassium chloride SA (KLOR-CON M) 20 MEQ tablet Take 1 tablet (20 mEq total) by mouth daily. 6 tablet 0   promethazine-dextromethorphan (PROMETHAZINE-DM) 6.25-15 MG/5ML syrup Take 5 mLs by mouth 4 (four) times daily as needed for cough. 118 mL 0   triamcinolone (NASACORT) 55 MCG/ACT AERO nasal inhaler Place 2 sprays into the nose 2 (two) times daily as needed.     levothyroxine (SYNTHROID) 50 MCG tablet TAKE 1 TABLET BY MOUTH DAILY BEFORE BREAKFAST 90 tablet 3   pneumococcal 20-valent conjugate vaccine (PREVNAR 20) 0.5 ML injection Inject 0.5 mLs into the muscle tomorrow at 10 am for 1 dose. 0.5 mL 0  predniSONE (DELTASONE) 10 MG tablet Take one tab 3 x day for 3 days, then take one tab 2 x a day for 3 days and then take one tab a day for 3 days for copd 18 tablet 0   No current facility-administered medications for this visit.    Objective: BP 128/78   Pulse 77   Temp 98.7 F (37.1 C)   Resp 16   Ht 5' (1.524 m)   Wt 127 lb (57.6 kg)   SpO2 96%   BMI 24.80 kg/m  Gen: NAD, alert, cooperative with exam CV: RRR, good S1/S2, no murmur Resp: CTABL, no wheezes, non-labored Neuro: Alert and oriented, No gross deficits   Assessment/Plan: 1. Encounter for subsequent annual wellness visit (AWV) in Medicare patient Age-appropriate preventive screenings and vaccinations discussed, annual physical exam completed. Routine labs for health maintenance done routinely with her oncologist but also need cholesterol panel to be checked. PHM updated.   2. Acute non-recurrent maxillary sinusitis Prednisone taper prescribed.  - predniSONE (DELTASONE) 10 MG tablet; Take one tab 3 x day for 3 days, then take one tab 2 x a day for 3 days and then take one tab a day for 3 days for copd  Dispense: 18  tablet; Refill: 0  3. Persistent cough for 3 weeks or longer Chest xray ordered to rule out pneumonia. Antibiotic and prednisone prescribed.  - DG Chest 2 View; Future - amoxicillin-clavulanate (AUGMENTIN) 875-125 MG tablet; Take 1 tablet by mouth 2 (two) times daily for 10 days. Take with food  Dispense: 20 tablet; Refill: 0 - predniSONE (DELTASONE) 10 MG tablet; Take one tab 3 x day for 3 days, then take one tab 2 x a day for 3 days and then take one tab a day for 3 days for copd  Dispense: 18 tablet; Refill: 0  4. SOB (shortness of breath) Chest xray ordered to rule out pneumonia, antibiotic and prednisone taper ordered.  - DG Chest 2 View; Future - amoxicillin-clavulanate (AUGMENTIN) 875-125 MG tablet; Take 1 tablet by mouth 2 (two) times daily for 10 days. Take with food  Dispense: 20 tablet; Refill: 0 - predniSONE (DELTASONE) 10 MG tablet; Take one tab 3 x day for 3 days, then take one tab 2 x a day for 3 days and then take one tab a day for 3 days for copd  Dispense: 18 tablet; Refill: 0  5. Acquired hypothyroidism Continue levothyroxine as prescribed.  - levothyroxine (SYNTHROID) 50 MCG tablet; TAKE 1 TABLET BY MOUTH DAILY BEFORE BREAKFAST  Dispense: 90 tablet; Refill: 3  6. Mixed hyperlipidemia Due for cholesterol panel to be checked  - Lipid Profile  7. Need for vaccination - pneumococcal 20-valent conjugate vaccine (PREVNAR 20) 0.5 ML injection; Inject 0.5 mLs into the muscle tomorrow at 10 am for 1 dose.  Dispense: 0.5 mL; Refill: 0    Orders Placed This Encounter  Procedures   DG Chest 2 View    Standing Status:   Future    Standing Expiration Date:   03/25/2024    Order Specific Question:   Reason for Exam (SYMPTOM  OR DIAGNOSIS REQUIRED)    Answer:   rule out pneumonia -- persistent cough and SOB    Order Specific Question:   Preferred imaging location?    Answer:   [facility]    Meds ordered this encounter  Medications   pneumococcal 20-valent conjugate  vaccine (PREVNAR 20) 0.5 ML injection    Sig: Inject 0.5 mLs into the muscle  tomorrow at 10 am for 1 dose.    Dispense:  0.5 mL    Refill:  0   amoxicillin-clavulanate (AUGMENTIN) 875-125 MG tablet    Sig: Take 1 tablet by mouth 2 (two) times daily for 10 days. Take with food    Dispense:  20 tablet    Refill:  0   predniSONE (DELTASONE) 10 MG tablet    Sig: Take one tab 3 x day for 3 days, then take one tab 2 x a day for 3 days and then take one tab a day for 3 days for copd    Dispense:  18 tablet    Refill:  0   levothyroxine (SYNTHROID) 50 MCG tablet    Sig: TAKE 1 TABLET BY MOUTH DAILY BEFORE BREAKFAST    Dispense:  90 tablet    Refill:  3    Return in about 6 months (around 09/24/2023) for F/U, Rhonda [name] PCP.  Total time spent:30 Minutes Time spent includes review of chart, medications, test results, and follow up plan with the patient.    Controlled Substance Database was reviewed by me.  This patient was seen by Sallyanne Kuster, FNP-C in collaboration with Dr. Beverely Risen as a part of collaborative care agreement.  Rhonda [name] R. Tedd Sias, MSN, FNP-C Internal medicine/Primary Care [facility]

## 2023-03-28 ENCOUNTER — Telehealth: Payer: Self-pay

## 2023-03-28 ENCOUNTER — Ambulatory Visit
Admission: RE | Admit: 2023-03-28 | Discharge: 2023-03-28 | Disposition: A | Discharge status: Home / Self Care | Payer: Medicare PPO | Source: Ambulatory Visit | Attending: Nurse Practitioner | Admitting: Nurse Practitioner

## 2023-03-28 DIAGNOSIS — R0602 Shortness of breath: Secondary | ICD-10-CM | POA: Diagnosis not present

## 2023-03-28 DIAGNOSIS — R079 Chest pain, unspecified: Secondary | ICD-10-CM | POA: Diagnosis not present

## 2023-03-28 DIAGNOSIS — R053 Chronic cough: Secondary | ICD-10-CM

## 2023-03-28 DIAGNOSIS — R059 Cough, unspecified: Secondary | ICD-10-CM | POA: Diagnosis not present

## 2023-03-28 NOTE — Telephone Encounter (Signed)
Patient notified

## 2023-03-28 NOTE — Progress Notes (Signed)
No pneumonia, thank goodness! Let her know please

## 2023-03-28 NOTE — Telephone Encounter (Signed)
-----   Message from Sallyanne Kuster sent at 03/28/2023  2:22 PM EDT ----- No pneumonia, thank goodness! Let her know please

## 2023-04-10 ENCOUNTER — Other Ambulatory Visit: Payer: Self-pay | Admitting: Oncology

## 2023-04-10 DIAGNOSIS — C9000 Multiple myeloma not having achieved remission: Secondary | ICD-10-CM

## 2023-04-16 ENCOUNTER — Ambulatory Visit: Payer: Medicare PPO | Admitting: Nurse Practitioner

## 2023-04-16 ENCOUNTER — Telehealth: Payer: Self-pay | Admitting: *Deleted

## 2023-04-16 ENCOUNTER — Inpatient Hospital Stay: Payer: Medicare PPO

## 2023-04-16 ENCOUNTER — Encounter: Payer: Self-pay | Admitting: Nurse Practitioner

## 2023-04-16 ENCOUNTER — Inpatient Hospital Stay: Payer: Medicare PPO | Admitting: Hospice and Palliative Medicine

## 2023-04-16 VITALS — BP 140/90 | HR 72 | Temp 98.1°F | Resp 16 | Ht 60.0 in | Wt 128.4 lb

## 2023-04-16 DIAGNOSIS — I951 Orthostatic hypotension: Secondary | ICD-10-CM

## 2023-04-16 DIAGNOSIS — R55 Syncope and collapse: Secondary | ICD-10-CM | POA: Diagnosis not present

## 2023-04-16 NOTE — Telephone Encounter (Signed)
I called patient to see if she still needed her smc apt today with Josh. She had a syncope event over the weekend. She saw pcp today and would like to cnl today's apt. She doesn't think we would do anything more or different than what the pcp has already ordered for her. I told her to call our office back should she have any additional concerns or changes her mind wanting to be evaluated in smc. She thanked me for calling.

## 2023-04-16 NOTE — Progress Notes (Signed)
[facility] [address] [place], [place] [address]  Internal MEDICINE  Office Visit Note  Patient Name: Rhonda [name]  [id number]  [id number]  Date of Service: 04/16/2023  Chief Complaint  Patient presents with   Acute Visit   Loss of Consciousness    Most recent episode was Saturday, 04/13/23.     HPI Rhonda [name] presents for an acute sick visit for dizziness and syncope.  --onset was about a week or 2 ago that the symptoms have become worse --having episodes of dizziness with feeling faint and/or fainting. Passed out over the weekend and her great nephew called EMS.  Has had 2 episodes of syncope that were preceded by symptoms of orthostatic intolerance. --lightheaded, diaphoretic, racing heartbeat,  Heart rate at rest while sitting was 73, standing HR is not spiking.     Current Medication:  Outpatient Encounter Medications as of 04/16/2023  Medication Sig Note   aspirin EC 81 MG tablet Take 81 mg by mouth daily.    Cyanocobalamin (B-12 PO) Take 500 mcg by mouth daily.     EPINEPHrine 0.3 mg/0.3 mL IJ SOAJ injection  08/13/2022: Once a week.   fexofenadine (ALLEGRA) 180 MG tablet Take 180 mg by mouth daily.    levothyroxine (SYNTHROID) 50 MCG tablet TAKE 1 TABLET BY MOUTH DAILY BEFORE BREAKFAST    Multiple Vitamins-Minerals (CENTRUM SILVER PO) Take 1 tablet by mouth daily.    POMALYST 2 MG capsule TAKE 1 CAPSULE BY MOUTH EVERY DAY FOR 21 DAYS ON THEN 7 DAYS OFF, REPEAT EVERY 28 DAYS    potassium chloride SA (KLOR-CON M) 20 MEQ tablet Take 1 tablet (20 mEq total) by mouth daily.    predniSONE (DELTASONE) 10 MG tablet Take one tab 3 x day for 3 days, then take one tab 2 x a day for 3 days and then take one tab a day for 3 days for copd    promethazine-dextromethorphan (PROMETHAZINE-DM) 6.25-15 MG/5ML syrup Take 5 mLs by mouth 4 (four) times daily as needed for cough.    triamcinolone (NASACORT) 55 MCG/ACT AERO nasal inhaler Place 2 sprays into the nose 2 (two) times  daily as needed.    No facility-administered encounter medications on file as of 04/16/2023.      Medical History: Past Medical History:  Diagnosis Date   Actinic keratosis    Arthritis    hands   Benign neoplasm of ascending colon    Cataract    GERD (gastroesophageal reflux disease)    Hyperlipemia    Hypertension 01/31/2015   Hypothyroidism    Melanoma (HCC) 04/28/2018   R mid to distal ant lat thigh. MM, SS, tumor thickness 0.5mm, antatomic level II. Excised: 05/13/18, margins free   Multiple myeloma (HCC) 04/18/2016   Multiple myeloma in remission (HCC) 04/18/2016   Osteoporosis    Shingles    Skin cancer    basal cell   Squamous cell carcinoma of skin 02/04/2014   Left pretibial. KA-like pattern   Squamous cell carcinoma of skin 09/15/2015   Right lat. superior ankle are. Superficial infiltration. Tx: EDC   Squamous cell carcinoma of skin 10/15/2016   Left medial mid lower leg. Tx: EDC   Squamous cell carcinoma of skin 11/27/2018   Right dorsum proximal forearm.   Squamous cell carcinoma of skin 09/09/2019   Right cheek lat. to mid nasolabial area. Tx: EDC   Squamous cell carcinoma of skin 12/09/2019   right pretibial below knee   Squamous cell carcinoma of  skin 06/25/2022   Left ant forearm - EDC     Vital Signs: BP (!) 140/90   Pulse 72   Temp 98.1 F (36.7 C)   Resp 16   Ht 5' (1.524 m)   Wt 128 lb 6.4 oz (58.2 kg)   SpO2 96%   BMI 25.08 kg/m    Review of Systems  Constitutional:  Positive for fatigue.  HENT: Negative.    Respiratory:  Positive for shortness of breath. Negative for cough, chest tightness and wheezing.   Cardiovascular:  Positive for palpitations (racing heart beat). Negative for chest pain.  Gastrointestinal: Negative.   Musculoskeletal:  Positive for gait problem.  Neurological:  Positive for dizziness, syncope, weakness and light-headedness.  Psychiatric/Behavioral:  Positive for sleep disturbance. Negative for self-injury  and suicidal ideas.     Physical Exam Vitals reviewed.  Constitutional:      General: She is not in acute distress.    Appearance: Normal appearance. She is normal weight. She is not ill-appearing.  HENT:     Head: Normocephalic and atraumatic.  Eyes:     Pupils: Pupils are equal, round, and reactive to light.  Cardiovascular:     Rate and Rhythm: Normal rate and regular rhythm.     Heart sounds: Normal heart sounds. No murmur heard. Pulmonary:     Effort: Pulmonary effort is normal. No respiratory distress.     Breath sounds: Normal breath sounds. No wheezing.  Neurological:     Mental Status: She is alert and oriented to person, place, and time.     Motor: Weakness present.     Gait: Gait abnormal.  Psychiatric:        Mood and Affect: Mood normal.        Behavior: Behavior normal.       Assessment/Plan: 1. Syncope, unspecified syncope type Additional labs ordered and referred to cardiology for further evaluation  - Thyroid Panel With TSH - Ferritin - Iron - Ambulatory referral to Cardiology  2. Orthostatic intolerance Additional labs ordered and referred to cardiology for further evaluation  - Thyroid Panel With TSH - Ferritin - Iron - Ambulatory referral to Cardiology   General Counseling: Sade verbalizes understanding of the findings of todays visit and agrees with plan of treatment. I have discussed any further diagnostic evaluation that may be needed or ordered today. We also reviewed her medications today. she has been encouraged to call the office with any questions or concerns that should arise related to todays visit.    Counseling:    Orders Placed This Encounter  Procedures   Thyroid Panel With TSH   Ferritin   Iron   Ambulatory referral to Cardiology    No orders of the defined types were placed in this encounter.   Return if symptoms worsen or fail to improve, for referred to cardiology, and keep routine follow up and annual visit.  .  [place] Controlled Substance Database was reviewed by me for overdose risk score (ORS)  Time spent:30 Minutes Time spent with patient included reviewing progress notes, labs, imaging studies, and discussing plan for follow up.   This patient was seen by Sallyanne Kuster, FNP-C in collaboration with Dr. Beverely Risen as a part of collaborative care agreement.  Buffey Zabinski R. Tedd Sias, MSN, FNP-C Internal Medicine

## 2023-05-06 ENCOUNTER — Other Ambulatory Visit: Payer: Self-pay | Admitting: *Deleted

## 2023-05-06 DIAGNOSIS — C9000 Multiple myeloma not having achieved remission: Secondary | ICD-10-CM

## 2023-05-06 MED ORDER — POMALIDOMIDE 2 MG PO CAPS: 2.0000 mg | ORAL_CAPSULE | Freq: Every day | ORAL | 0 refills | Status: DC

## 2023-05-07 ENCOUNTER — Other Ambulatory Visit: Payer: Self-pay | Admitting: Oncology

## 2023-05-07 DIAGNOSIS — C9000 Multiple myeloma not having achieved remission: Secondary | ICD-10-CM

## 2023-05-11 ENCOUNTER — Encounter: Payer: Self-pay | Admitting: Nurse Practitioner

## 2023-05-16 ENCOUNTER — Telehealth: Payer: Self-pay

## 2023-05-16 NOTE — Telephone Encounter (Signed)
-----   Message from Calmar sent at 05/16/2023  8:15 AM EST ----- Please let patient know that I ordered a cholesterol panel for her to have drawn, it has been a while since we have looked at her cholesterol levels, make sure to fast

## 2023-05-16 NOTE — Telephone Encounter (Signed)
Patient notified

## 2023-06-05 ENCOUNTER — Other Ambulatory Visit: Payer: Self-pay | Admitting: Oncology

## 2023-06-05 DIAGNOSIS — C9000 Multiple myeloma not having achieved remission: Secondary | ICD-10-CM

## 2023-06-06 ENCOUNTER — Encounter: Payer: Self-pay | Admitting: Oncology

## 2023-06-10 ENCOUNTER — Telehealth: Payer: Self-pay

## 2023-06-11 MED ORDER — AMOXICILLIN-POT CLAVULANATE 875-125 MG PO TABS: 1.0000 | ORAL_TABLET | Freq: Two times a day (BID) | ORAL | 0 refills | Status: DC

## 2023-06-11 NOTE — Telephone Encounter (Signed)
Patient notified that medicine was sent in for her.

## 2023-06-13 ENCOUNTER — Inpatient Hospital Stay: Payer: Medicare PPO | Attending: Oncology

## 2023-06-13 DIAGNOSIS — C9000 Multiple myeloma not having achieved remission: Secondary | ICD-10-CM

## 2023-06-13 DIAGNOSIS — Z85820 Personal history of malignant melanoma of skin: Secondary | ICD-10-CM | POA: Diagnosis not present

## 2023-06-13 DIAGNOSIS — R197 Diarrhea, unspecified: Secondary | ICD-10-CM | POA: Insufficient documentation

## 2023-06-13 DIAGNOSIS — I10 Essential (primary) hypertension: Secondary | ICD-10-CM | POA: Diagnosis not present

## 2023-06-13 DIAGNOSIS — C9002 Multiple myeloma in relapse: Secondary | ICD-10-CM | POA: Diagnosis not present

## 2023-06-13 DIAGNOSIS — Z85828 Personal history of other malignant neoplasm of skin: Secondary | ICD-10-CM | POA: Diagnosis not present

## 2023-06-13 DIAGNOSIS — Z87440 Personal history of urinary (tract) infections: Secondary | ICD-10-CM | POA: Diagnosis not present

## 2023-06-13 DIAGNOSIS — Z79899 Other long term (current) drug therapy: Secondary | ICD-10-CM | POA: Diagnosis not present

## 2023-06-13 DIAGNOSIS — G629 Polyneuropathy, unspecified: Secondary | ICD-10-CM | POA: Diagnosis not present

## 2023-06-13 LAB — CBC WITH DIFFERENTIAL/PLATELET
Abs Immature Granulocytes: 0.04 10*3/uL (ref 0.00–0.07)
Basophils Absolute: 0.1 10*3/uL (ref 0.0–0.1)
Basophils Relative: 2 %
Eosinophils Absolute: 0 10*3/uL (ref 0.0–0.5)
Eosinophils Relative: 1 %
HCT: 39.2 % (ref 36.0–46.0)
Hemoglobin: 13 g/dL (ref 12.0–15.0)
Immature Granulocytes: 1 %
Lymphocytes Relative: 20 %
Lymphs Abs: 1.3 10*3/uL (ref 0.7–4.0)
MCH: 32.2 pg (ref 26.0–34.0)
MCHC: 33.2 g/dL (ref 30.0–36.0)
MCV: 97 fL (ref 80.0–100.0)
Monocytes Absolute: 1.3 10*3/uL — ABNORMAL HIGH (ref 0.1–1.0)
Monocytes Relative: 20 %
Neutro Abs: 3.7 10*3/uL (ref 1.7–7.7)
Neutrophils Relative %: 56 %
Platelets: 307 10*3/uL (ref 150–400)
RBC: 4.04 MIL/uL (ref 3.87–5.11)
RDW: 13.5 % (ref 11.5–15.5)
WBC: 6.6 10*3/uL (ref 4.0–10.5)
nRBC: 0 % (ref 0.0–0.2)

## 2023-06-13 LAB — COMPREHENSIVE METABOLIC PANEL
ALT: 18 U/L (ref 0–44)
AST: 16 U/L (ref 15–41)
Albumin: 3.6 g/dL (ref 3.5–5.0)
Alkaline Phosphatase: 57 U/L (ref 38–126)
Anion gap: 8 (ref 5–15)
BUN: 22 mg/dL (ref 8–23)
CO2: 25 mmol/L (ref 22–32)
Calcium: 9.3 mg/dL (ref 8.9–10.3)
Chloride: 102 mmol/L (ref 98–111)
Creatinine, Ser: 0.88 mg/dL (ref 0.44–1.00)
GFR, Estimated: >60 mL/min (ref >60)
Glucose, Bld: 104 mg/dL — ABNORMAL HIGH (ref 70–99)
Potassium: 4.1 mmol/L (ref 3.5–5.1)
Sodium: 135 mmol/L (ref 135–145)
Total Bilirubin: 0.2 mg/dL (ref 0.0–1.2)
Total Protein: 6.7 g/dL (ref 6.5–8.1)

## 2023-06-15 LAB — IGG, IGA, IGM
IgA: 172 mg/dL (ref 64–422)
IgG (Immunoglobin G), Serum: 754 mg/dL (ref 586–1602)
IgM (Immunoglobulin M), Srm: 33 mg/dL (ref 26–217)

## 2023-06-17 ENCOUNTER — Ambulatory Visit: Payer: Medicare PPO | Admitting: Dermatology

## 2023-06-17 DIAGNOSIS — L578 Other skin changes due to chronic exposure to nonionizing radiation: Secondary | ICD-10-CM

## 2023-06-17 DIAGNOSIS — C4492 Squamous cell carcinoma of skin, unspecified: Secondary | ICD-10-CM

## 2023-06-17 DIAGNOSIS — D492 Neoplasm of unspecified behavior of bone, soft tissue, and skin: Secondary | ICD-10-CM

## 2023-06-17 DIAGNOSIS — L820 Inflamed seborrheic keratosis: Secondary | ICD-10-CM | POA: Diagnosis not present

## 2023-06-17 DIAGNOSIS — L570 Actinic keratosis: Secondary | ICD-10-CM | POA: Diagnosis not present

## 2023-06-17 DIAGNOSIS — C44622 Squamous cell carcinoma of skin of right upper limb, including shoulder: Secondary | ICD-10-CM | POA: Diagnosis not present

## 2023-06-17 DIAGNOSIS — W908XXA Exposure to other nonionizing radiation, initial encounter: Secondary | ICD-10-CM

## 2023-06-17 HISTORY — DX: Squamous cell carcinoma of skin, unspecified: C44.92

## 2023-06-17 LAB — KAPPA/LAMBDA LIGHT CHAINS
Kappa free light chain: 27.1 mg/L — ABNORMAL HIGH (ref 3.3–19.4)
Kappa, lambda light chain ratio: 0.06 — ABNORMAL LOW (ref 0.26–1.65)
Lambda free light chains: 430.3 mg/L — ABNORMAL HIGH (ref 5.7–26.3)

## 2023-06-17 NOTE — Progress Notes (Signed)
 Follow-Up Visit   Subjective  APPOLONIA ACKERT is a 89 y.o. female who presents for the following: Patient c/o a growth on her right arm for several weeks growing and changing in size  and will not go away,  The patient has spots, moles and lesions to be evaluated, some may be new or changing and the patient may have concern these could be cancer.   The following portions of the chart were reviewed this encounter and updated as appropriate: medications, allergies, medical history  Review of Systems:  No other skin or systemic complaints except as noted in HPI or Assessment and Plan.  Objective  Well appearing patient in no apparent distress; mood and affect are within normal limits.  A focused examination was performed of the following areas: right arm, left arm, face, left hand, right hand    Relevant exam findings are noted in the Assessment and Plan.  right volar  forearm 2.2 cm hyperkeratotic papule   face,hands x 17 (17) Erythematous thin papules/macules with gritty scale.  left hand, right arm (2) Stuck-on, waxy, tan-brown papule or plaque --Discussed benign etiology and prognosis.   Assessment & Plan    NEOPLASM OF SKIN right volar  forearm Skin excision  Total excision diameter (cm):  2.2 Informed consent: discussed and consent obtained   Timeout: patient name, date of birth, surgical site, and procedure verified   Procedure prep:  Patient was prepped and draped in usual sterile fashion Prep type:  Isopropyl alcohol and povidone-iodine Anesthesia: the lesion was anesthetized in a standard fashion   Anesthetic:  1% lidocaine  w/ epinephrine  1-100,000 buffered w/ 8.4% NaHCO3 Instrument used: #15 blade   Hemostasis achieved with: pressure   Hemostasis achieved with comment:  Electrocautery Outcome: patient tolerated procedure well with no complications   Post-procedure details: sterile dressing applied and wound care instructions given   Dressing type: bandage  and pressure dressing (Mupirocin )    Destruction of lesion  Destruction method: electrodesiccation and curettage   Informed consent: discussed and consent obtained   Timeout:  patient name, date of birth, surgical site, and procedure verified Anesthesia: the lesion was anesthetized in a standard fashion   Anesthetic:  1% lidocaine  w/ epinephrine  1-100,000 buffered w/ 8.4% NaHCO3 Curettage performed in three different directions: Yes   Electrodesiccation performed over the curetted area: Yes   Curettage cycles:  3 Final wound size (cm):  2.2 Hemostasis achieved with:  electrodesiccation Outcome: patient tolerated procedure well with no complications   Post-procedure details: sterile dressing applied and wound care instructions given   Dressing type: petrolatum   AK (ACTINIC KERATOSIS) (17) face,hands x 17 (17) Destruction of lesion - face,hands x 17 (17) Complexity: simple   Destruction method: cryotherapy   Informed consent: discussed and consent obtained   Timeout:  patient name, date of birth, surgical site, and procedure verified Lesion destroyed using liquid nitrogen: Yes   Region frozen until ice ball extended beyond lesion: Yes   Outcome: patient tolerated procedure well with no complications   Post-procedure details: wound care instructions given   INFLAMED SEBORRHEIC KERATOSIS (2) left hand, right arm (2) Destruction of lesion - left hand, right arm (2) Complexity: simple   Destruction method: cryotherapy   Informed consent: discussed and consent obtained   Timeout:  patient name, date of birth, surgical site, and procedure verified Lesion destroyed using liquid nitrogen: Yes   Region frozen until ice ball extended beyond lesion: Yes   Outcome: patient tolerated procedure well with  no complications   Post-procedure details: wound care instructions given    ACTINIC DAMAGE - chronic, secondary to cumulative UV radiation exposure/sun exposure over time - diffuse scaly  erythematous macules with underlying dyspigmentation - Recommend daily broad spectrum sunscreen SPF 30+ to sun-exposed areas, reapply every 2 hours as needed.  - Recommend staying in the shade or wearing long sleeves, sun glasses (UVA+UVB protection) and wide brim hats (4-inch brim around the entire circumference of the hat). - Call for new or changing lesions.    Return in about 7 months (around 01/15/2024), or if symptoms worsen or fail to improve, for TBSE, hx of Melanoma .   Documentation: I have reviewed the above documentation for accuracy and completeness, and I agree with the above.  Alm Rhyme, MD

## 2023-06-17 NOTE — Patient Instructions (Addendum)
 Cryotherapy Aftercare  Wash gently with soap and water everyday.   Apply Vaseline and Band-Aid daily until healed.   Wound Care Instructions  Cleanse wound gently with soap and water once a day then pat dry with clean gauze. Apply a thin coat of Petrolatum (petroleum jelly, Vaseline) over the wound (unless you have an allergy to this). We recommend that you use a new, sterile tube of Vaseline. Do not pick or remove scabs. Do not remove the yellow or white healing tissue from the base of the wound.  Cover the wound with fresh, clean, nonstick gauze and secure with paper tape. You may use Band-Aids in place of gauze and tape if the wound is small enough, but would recommend trimming much of the tape off as there is often too much. Sometimes Band-Aids can irritate the skin.  You should call the office for your biopsy report after 1 week if you have not already been contacted.  If you experience any problems, such as abnormal amounts of bleeding, swelling, significant bruising, significant pain, or evidence of infection, please call the office immediately.  FOR ADULT SURGERY PATIENTS: If you need something for pain relief you may take 1 extra strength Tylenol  (acetaminophen ) AND 2 Ibuprofen (200mg  each) together every 4 hours as needed for pain. (do not take these if you are allergic to them or if you have a reason you should not take them.) Typically, you may only need pain medication for 1 to 3 days.     Due to recent changes in healthcare laws, you may see results of your pathology and/or laboratory studies on MyChart before the doctors have had a chance to review them. We understand that in some cases there may be results that are confusing or concerning to you. Please understand that not all results are received at the same time and often the doctors may need to interpret multiple results in order to provide you with the best plan of care or course of treatment. Therefore, we ask that you  please give us  2 business days to thoroughly review all your results before contacting the office for clarification. Should we see a critical lab result, you will be contacted sooner.   If You Need Anything After Your Visit  If you have any questions or concerns for your doctor, please call our main line at [phone] and press option 4 to reach your doctor's medical assistant. If no one answers, please leave a voicemail as directed and we will return your call as soon as possible. Messages left after 4 pm will be answered the following business day.   You may also send us  a message via MyChart. We typically respond to MyChart messages within 1-2 business days.  For prescription refills, please ask your pharmacy to contact our office. Our fax number is [phone].  If you have an urgent issue when the clinic is closed that cannot wait until the next business day, you can page your doctor at the number below.    Please note that while we do our best to be available for urgent issues outside of office hours, we are not available 24/7.   If you have an urgent issue and are unable to reach us , you may choose to seek medical care at your doctor's office, retail clinic, urgent care center, or emergency room.  If you have a medical emergency, please immediately call 911 or go to the emergency department.  Pager Numbers  - Dr. Hester: [phone]  -  Dr. Jackquline: [phone]  - Dr. Claudene: [phone]   In the event of inclement weather, please call our main line at [phone] for an update on the status of any delays or closures.  Dermatology Medication Tips: Please keep the boxes that topical medications come in in order to help keep track of the instructions about where and how to use these. Pharmacies typically print the medication instructions only on the boxes and not directly on the medication tubes.   If your medication is too expensive, please contact our office at  [phone] option 4 or send us  a message through MyChart.   We are unable to tell what your co-pay for medications will be in advance as this is different depending on your insurance coverage. However, we may be able to find a substitute medication at lower cost or fill out paperwork to get insurance to cover a needed medication.   If a prior authorization is required to get your medication covered by your insurance company, please allow us  1-2 business days to complete this process.  Drug prices often vary depending on where the prescription is filled and some pharmacies may offer cheaper prices.  The website www.goodrx.com contains coupons for medications through different pharmacies. The prices here do not account for what the cost may be with help from insurance (it may be cheaper with your insurance), but the website can give you the price if you did not use any insurance.  - You can print the associated coupon and take it with your prescription to the pharmacy.  - You may also stop by our office during regular business hours and pick up a GoodRx coupon card.  - If you need your prescription sent electronically to a different pharmacy, notify our office through [facility] or by phone at [phone] option 4.     Si Usted Necesita Algo Despus de Su Visita  Tambin puede enviarnos un mensaje a travs de Clinical cytogeneticist. Por lo general respondemos a los mensajes de MyChart en el transcurso de 1 a 2 das hbiles.  Para renovar recetas, por favor pida a su farmacia que se ponga en contacto con nuestra oficina. Randi lakes de fax es [place] [phone].  Si tiene un asunto urgente cuando la clnica est cerrada y que no puede esperar hasta el siguiente da hbil, puede llamar/localizar a su doctor(a) al nmero que aparece a continuacin.   Por favor, tenga en cuenta que aunque hacemos todo lo posible para estar disponibles para asuntos urgentes fuera del horario de [place], no estamos  disponibles las 24 horas del da, los 7 [address]  [address] de la [place].   Si tiene un problema urgente y no puede comunicarse con nosotros, puede optar por buscar atencin mdica  en el consultorio de su doctor(a), en una clnica privada, en un centro de atencin urgente o en una sala de emergencias.  Si tiene Engineer, drilling, por favor llame inmediatamente al 911 o vaya a la sala de emergencias.  Nmeros de bper  - Dr. Hester: [phone]  - Dra. Jackquline: [phone]  - Dr. Claudene: [phone]   En caso de inclemencias del tiempo, por favor llame a landry capes principal al [phone] para una actualizacin sobre el [place] de cualquier retraso o cierre.  Consejos para la medicacin en dermatologa: Por favor, guarde las cajas en las que vienen los medicamentos de uso tpico para ayudarle a seguir las instrucciones sobre dnde y cmo usarlos. Las farmacias generalmente imprimen las instrucciones del medicamento slo en las  cajas y no directamente en los tubos del medicamento.   Si su medicamento es muy caro, por favor, pngase en contacto con landry rieger llamando al [phone] y presione la opcin 4 o envenos un mensaje a travs de Clinical cytogeneticist.   No podemos decirle cul ser su copago por los medicamentos por adelantado ya que esto es diferente dependiendo de la cobertura de su seguro. Sin embargo, es posible que podamos encontrar un medicamento sustituto a Audiological scientist un formulario para que el seguro cubra el medicamento que se considera necesario.   Si se requiere una autorizacin previa para que su compaa de seguros malta su medicamento, por favor permtanos de 1 a 2 das hbiles para completar este proceso.  Los precios de los medicamentos varan con frecuencia dependiendo del Environmental consultant de dnde se surte la receta y alguna farmacias pueden ofrecer precios ms baratos.  El sitio web www.goodrx.com tiene cupones para medicamentos de Health and safety inspector. Los precios aqu no  tienen en cuenta lo que podra costar con la ayuda del seguro (puede ser ms barato con su seguro), pero el sitio web puede darle el precio si no utiliz Tourist information centre manager.  - Puede imprimir el cupn correspondiente y llevarlo con su receta a la farmacia.  - Tambin puede pasar por nuestra oficina durante el horario de atencin regular y Education officer, museum una tarjeta de cupones de GoodRx.  - Si necesita que su receta se enve electrnicamente a una farmacia diferente, informe a nuestra oficina a travs de MyChart de [place] o por telfono llamando al [phone] y presione la opcin 4.

## 2023-06-18 LAB — SURGICAL PATHOLOGY

## 2023-06-19 ENCOUNTER — Encounter: Payer: Self-pay | Admitting: Dermatology

## 2023-06-19 ENCOUNTER — Telehealth: Payer: Self-pay

## 2023-06-19 NOTE — Telephone Encounter (Signed)
 Returned patient's call and discussed pathology results.

## 2023-06-19 NOTE — Telephone Encounter (Signed)
 Left pt msg to call for bx results/sh

## 2023-06-19 NOTE — Telephone Encounter (Signed)
-----   Message from Armida Sans sent at 06/19/2023  6:46 AM EST ----- FINAL DIAGNOSIS        1. Skin (M), right volar forearm :       WELL DIFFERENTIATED SQUAMOUS CELL CARCINOMA, BASE INVOLVED   Cancer - SCC Already treated Recheck next visit

## 2023-06-20 ENCOUNTER — Encounter: Payer: Self-pay | Admitting: Oncology

## 2023-06-20 ENCOUNTER — Inpatient Hospital Stay: Payer: Medicare PPO

## 2023-06-20 ENCOUNTER — Inpatient Hospital Stay: Payer: Medicare PPO | Admitting: Pharmacist

## 2023-06-20 ENCOUNTER — Inpatient Hospital Stay (HOSPITAL_BASED_OUTPATIENT_CLINIC_OR_DEPARTMENT_OTHER): Payer: Medicare PPO | Admitting: Oncology

## 2023-06-20 ENCOUNTER — Other Ambulatory Visit: Payer: Medicare PPO

## 2023-06-20 VITALS — BP 130/62 | HR 82 | Temp 98.3°F | Resp 16 | Ht 60.0 in | Wt 126.0 lb

## 2023-06-20 DIAGNOSIS — Z85820 Personal history of malignant melanoma of skin: Secondary | ICD-10-CM | POA: Diagnosis not present

## 2023-06-20 DIAGNOSIS — I10 Essential (primary) hypertension: Secondary | ICD-10-CM | POA: Diagnosis not present

## 2023-06-20 DIAGNOSIS — C9000 Multiple myeloma not having achieved remission: Secondary | ICD-10-CM

## 2023-06-20 DIAGNOSIS — G629 Polyneuropathy, unspecified: Secondary | ICD-10-CM | POA: Diagnosis not present

## 2023-06-20 DIAGNOSIS — Z79899 Other long term (current) drug therapy: Secondary | ICD-10-CM | POA: Diagnosis not present

## 2023-06-20 DIAGNOSIS — Z85828 Personal history of other malignant neoplasm of skin: Secondary | ICD-10-CM | POA: Diagnosis not present

## 2023-06-20 DIAGNOSIS — Z87440 Personal history of urinary (tract) infections: Secondary | ICD-10-CM | POA: Diagnosis not present

## 2023-06-20 DIAGNOSIS — C9002 Multiple myeloma in relapse: Secondary | ICD-10-CM | POA: Diagnosis not present

## 2023-06-20 DIAGNOSIS — R197 Diarrhea, unspecified: Secondary | ICD-10-CM | POA: Diagnosis not present

## 2023-06-20 MED ORDER — SODIUM CHLORIDE 0.9 % IV SOLN
INTRAVENOUS | Status: DC
  Filled 2023-06-20: qty 250

## 2023-06-20 MED ORDER — ZOLEDRONIC ACID 4 MG/5ML IV CONC
3.3000 mg | Freq: Once | INTRAVENOUS | Status: AC
  Administered 2023-06-20: 3.3 mg via INTRAVENOUS
  Filled 2023-06-20: qty 4.13

## 2023-06-20 NOTE — Progress Notes (Signed)
 Oral Chemotherapy Clinic [facility]  [phone] [phone]  Patient Care Team: Liana Fish, NP as PCP - General (Nurse Practitioner) Jacobo Evalene PARAS, MD as Consulting Physician (Oncology)   Name of the patient: Rhonda [name]  [id number]  1933-12-07   Date of visit: 06/20/23  HPI: Patient is a 89 y.o. female with multiple myeloma. Currently on treatment with pomalidomide .   Reason for Consult: Oral chemotherapy follow-up for pomalidomide  therapy.   PAST MEDICAL HISTORY: Past Medical History:  Diagnosis Date   Actinic keratosis    Arthritis    hands   Benign neoplasm of ascending colon    Cataract    GERD (gastroesophageal reflux disease)    Hyperlipemia    Hypertension 01/31/2015   Hypothyroidism    Melanoma (HCC) 04/28/2018   R mid to distal ant lat thigh. MM, SS, tumor thickness 0.27mm, antatomic level II. Excised: 05/13/18, margins free   Multiple myeloma (HCC) 04/18/2016   Multiple myeloma in remission (HCC) 04/18/2016   Osteoporosis    SCC (squamous cell carcinoma) 06/17/2023   R volar forearm, EDC   Shingles    Skin cancer    basal cell   Squamous cell carcinoma of skin 02/04/2014   Left pretibial. KA-like pattern   Squamous cell carcinoma of skin 09/15/2015   Right lat. superior ankle are. Superficial infiltration. Tx: EDC   Squamous cell carcinoma of skin 10/15/2016   Left medial mid lower leg. Tx: EDC   Squamous cell carcinoma of skin 11/27/2018   Right dorsum proximal forearm.   Squamous cell carcinoma of skin 09/09/2019   Right cheek lat. to mid nasolabial area. Tx: EDC   Squamous cell carcinoma of skin 12/09/2019   right pretibial below knee   Squamous cell carcinoma of skin 06/25/2022   Left ant forearm - EDC    HEMATOLOGY/ONCOLOGY HISTORY:  Oncology History  Multiple myeloma (HCC)  04/18/2016 Initial Diagnosis   Multiple myeloma (HCC)     ALLERGIES:  is allergic to revlimid   [lenalidomide ].  MEDICATIONS:  Current Outpatient Medications  Medication Sig Dispense Refill   aspirin EC 81 MG tablet Take 81 mg by mouth daily.     Cyanocobalamin (B-12 PO) Take 500 mcg by mouth daily.      EPINEPHrine  0.3 mg/0.3 mL IJ SOAJ injection      fexofenadine (ALLEGRA) 180 MG tablet Take 180 mg by mouth daily.     levothyroxine  (SYNTHROID ) 50 MCG tablet TAKE 1 TABLET BY MOUTH DAILY BEFORE BREAKFAST 90 tablet 3   Multiple Vitamins-Minerals (CENTRUM SILVER PO) Take 1 tablet by mouth daily.     POMALYST  2 MG capsule TAKE 1 CAPSULE BY MOUTH EVERY DAY 21 capsule 0   potassium chloride  SA (KLOR-CON  M) 20 MEQ tablet Take 1 tablet (20 mEq total) by mouth daily. 6 tablet 0   triamcinolone  (NASACORT ) 55 MCG/ACT AERO nasal inhaler Place 2 sprays into the nose 2 (two) times daily as needed.     No current facility-administered medications for this visit.   Facility-Administered Medications Ordered in Other Visits  Medication Dose Route Frequency Provider Last Rate Last Admin   0.9 %  sodium chloride  infusion   Intravenous Continuous Finnegan, Timothy J, MD       zoledronic  acid (ZOMETA ) 3.3 mg in sodium chloride  0.9 % 100 mL IVPB  3.3 mg Intravenous Once Finnegan, Timothy J, MD        VITAL SIGNS: There were no vitals taken for this visit. There were no vitals  filed for this visit.  Estimated body mass index is 24.61 kg/m as calculated from the following:   Height as of an earlier encounter on 06/20/23: 5' (1.524 m).   Weight as of an earlier encounter on 06/20/23: 57.2 kg (126 lb).  LABS: CBC:    Component Value Date/Time   WBC 6.6 06/13/2023 1345   HGB 13.0 06/13/2023 1345   HCT 39.2 06/13/2023 1345   PLT 307 06/13/2023 1345   MCV 97.0 06/13/2023 1345   NEUTROABS 3.7 06/13/2023 1345   LYMPHSABS 1.3 06/13/2023 1345   MONOABS 1.3 (H) 06/13/2023 1345   EOSABS 0.0 06/13/2023 1345   BASOSABS 0.1 06/13/2023 1345   Comprehensive Metabolic Panel:    Component Value Date/Time    NA 135 06/13/2023 1345   NA 138 07/24/2022 1044   K 4.1 06/13/2023 1345   CL 102 06/13/2023 1345   CO2 25 06/13/2023 1345   BUN 22 06/13/2023 1345   BUN 19 07/24/2022 1044   CREATININE 0.88 06/13/2023 1345   GLUCOSE 104 (H) 06/13/2023 1345   CALCIUM 9.3 06/13/2023 1345   AST 16 06/13/2023 1345   ALT 18 06/13/2023 1345   ALKPHOS 57 06/13/2023 1345   BILITOT 0.2 06/13/2023 1345   BILITOT 0.5 07/24/2022 1044   PROT 6.7 06/13/2023 1345   PROT 6.6 07/24/2022 1044   ALBUMIN 3.6 06/13/2023 1345   ALBUMIN 4.5 07/24/2022 1044     Present during today's visit: patient only  Assessment and Plan: Reviewed CBC/CMP/light chains/Ig with patient, continue pomalidomide  2mg  21on/7off Slight increase in patient's lambda light chains, but not significantly outside of the patients normal range. Will continue to monitor.  Okay to receive Zometa  today   Oral Chemotherapy Side Effect/Intolerance:  Bowel movement changes: patient deals with both diarrhea and constipation, patient most recently needing to manage diarrhea with loperamide   Other: Of note, patient has an upcoming visit with cardiology to address syncope episodes that happened last year. She does not report any recent syncope episodes  Oral Chemotherapy Adherence: no missed doses reported No patient barriers to medication adherence identified.   New medications: none reported  Medication Access Issues: No issues, patient fills at [facility]  Patient expressed understanding and was in agreement with this plan. She also understands that She can call clinic at any time with any questions, concerns, or complaints.   Follow-up plan: RTC 3 months: see MD, Zometa  , labs 1 week prior  Thank you for allowing me to participate in the care of this very pleasant patient.   Time Total: 10 mins  Visit consisted of counseling and education on dealing with issues of symptom management in the setting of serious and potentially  life-threatening illness.Greater than 50%  of this time was spent counseling and coordinating care related to the above assessment and plan.  Signed by: Tyniesha Howald N. Egon Dittus, PharmD, BCPS, NEILA, CPP Hematology/Oncology Clinical Pharmacist Practitioner [place]/DB/AP Oral Chemotherapy Navigation Clinic [phone]  06/20/2023 3:03 PM

## 2023-06-20 NOTE — Progress Notes (Signed)
 [facility]  Telephone:([phone] [phone]  ID: Rhonda [name] Rex OB: 10-05-1933  MR#: [id number]  RDW#:[id number]  Patient Care Team: Liana Fish, NP as PCP - General (Nurse Practitioner) Jacobo Evalene PARAS, MD as Consulting Physician (Oncology)   CHIEF COMPLAINT:  Multiple myeloma in relapse.  INTERVAL HISTORY: Patient returns to clinic today for repeat laboratory work, further evaluation, and continuation of Pomalyst  and Zometa .  She continues to have intermittent diarrhea and states GI has elected conservative management.  She otherwise feels well.  She has a chronic peripheral neuropathy, but no other neurologic complaints.  She denies any fevers. She has a good appetite and denies weight loss.  She denies any chest pain, shortness of breath, cough, or hemoptysis.  She denies any nausea, vomiting, or constipation. She has no melena or hematochezia.  She has no urinary complaints.  Patient offers no further specific complaints today.  REVIEW OF SYSTEMS:   Review of Systems  Constitutional: Negative.  Negative for fever, malaise/fatigue and weight loss.  HENT:  Negative for congestion and sore throat.   Respiratory: Negative.  Negative for cough and shortness of breath.   Cardiovascular: Negative.  Negative for chest pain, palpitations and leg swelling.  Gastrointestinal:  Positive for diarrhea. Negative for abdominal pain, blood in stool, constipation, melena, nausea and vomiting.  Genitourinary: Negative.  Negative for dysuria.  Musculoskeletal: Negative.  Negative for back pain.  Skin: Negative.  Negative for rash.  Neurological:  Positive for tingling and sensory change. Negative for focal weakness and weakness.  Psychiatric/Behavioral: Negative.  The patient is not nervous/anxious and does not have insomnia.     As per HPI. Otherwise, a complete review of systems is negative.  PAST MEDICAL HISTORY: Past Medical History:  Diagnosis Date    Actinic keratosis    Arthritis    hands   Benign neoplasm of ascending colon    Cataract    GERD (gastroesophageal reflux disease)    Hyperlipemia    Hypertension 01/31/2015   Hypothyroidism    Melanoma (HCC) 04/28/2018   R mid to distal ant lat thigh. MM, SS, tumor thickness 0.52mm, antatomic level II. Excised: 05/13/18, margins free   Multiple myeloma (HCC) 04/18/2016   Multiple myeloma in remission (HCC) 04/18/2016   Osteoporosis    SCC (squamous cell carcinoma) 06/17/2023   R volar forearm, EDC   Shingles    Skin cancer    basal cell   Squamous cell carcinoma of skin 02/04/2014   Left pretibial. KA-like pattern   Squamous cell carcinoma of skin 09/15/2015   Right lat. superior ankle are. Superficial infiltration. Tx: EDC   Squamous cell carcinoma of skin 10/15/2016   Left medial mid lower leg. Tx: EDC   Squamous cell carcinoma of skin 11/27/2018   Right dorsum proximal forearm.   Squamous cell carcinoma of skin 09/09/2019   Right cheek lat. to mid nasolabial area. Tx: EDC   Squamous cell carcinoma of skin 12/09/2019   right pretibial below knee   Squamous cell carcinoma of skin 06/25/2022   Left ant forearm - EDC    PAST SURGICAL HISTORY: Past Surgical History:  Procedure Laterality Date   BREAST EXCISIONAL BIOPSY Right 15+ yrs ago   EXCISIONAL - NEG   CATARACT EXTRACTION W/ INTRAOCULAR LENS IMPLANT     CHOLECYSTECTOMY N/A 03/14/2017   Procedure: LAPAROSCOPIC CHOLECYSTECTOMY;  Surgeon: Wonda Charlie BRAVO, MD;  Location: [facility] OR[facility];  Service: General;  Laterality: N/A;   COLONOSCOPY WITH PROPOFOL  N/A 02/21/2015  Procedure: COLONOSCOPY WITH PROPOFOL ;  Surgeon: Rogelia Copping, MD;  Location: [facility];  Service: Endoscopy;  Laterality: N/A;   LAPAROSCOPIC HYSTERECTOMY  1980   POLYPECTOMY  02/21/2015   Procedure: POLYPECTOMY;  Surgeon: Rogelia Copping, MD;  Location: [facility];  Service: Endoscopy;;    FAMILY HISTORY: Family History  Problem Relation Age of  Onset   Heart disease Mother    Stroke Father    Heart disease Brother    Kidney cancer Neg Hx    Prostate cancer Neg Hx    Bladder Cancer Neg Hx    Breast cancer Neg Hx     ADVANCED DIRECTIVES (Y/N):  N  HEALTH MAINTENANCE: Social History   Tobacco Use   Smoking status: Never   Smokeless tobacco: Never  Vaping Use   Vaping status: Never Used  Substance Use Topics   Alcohol use: No    Alcohol/week: 0.0 standard drinks of alcohol   Drug use: No     Colonoscopy:  PAP:  Bone density:  Lipid panel:  Allergies  Allergen Reactions   Revlimid  [Lenalidomide ]     Current Outpatient Medications  Medication Sig Dispense Refill   aspirin EC 81 MG tablet Take 81 mg by mouth daily.     Cyanocobalamin (B-12 PO) Take 500 mcg by mouth daily.      EPINEPHrine  0.3 mg/0.3 mL IJ SOAJ injection      fexofenadine (ALLEGRA) 180 MG tablet Take 180 mg by mouth daily.     levothyroxine  (SYNTHROID ) 50 MCG tablet TAKE 1 TABLET BY MOUTH DAILY BEFORE BREAKFAST 90 tablet 3   Multiple Vitamins-Minerals (CENTRUM SILVER PO) Take 1 tablet by mouth daily.     POMALYST  2 MG capsule TAKE 1 CAPSULE BY MOUTH EVERY DAY 21 capsule 0   potassium chloride  SA (KLOR-CON  M) 20 MEQ tablet Take 1 tablet (20 mEq total) by mouth daily. 6 tablet 0   triamcinolone  (NASACORT ) 55 MCG/ACT AERO nasal inhaler Place 2 sprays into the nose 2 (two) times daily as needed.     No current facility-administered medications for this visit.    OBJECTIVE: Vitals:   06/20/23 1356  BP: 130/62  Pulse: 82  Resp: 16  Temp: 98.3 F (36.8 C)  SpO2: 98%     Body mass index is 24.61 kg/m.    ECOG FS:0 - Asymptomatic  General: Well-developed, well-nourished, no acute distress. Eyes: Pink conjunctiva, anicteric sclera. HEENT: Normocephalic, moist mucous membranes. Lungs: No audible wheezing or coughing. Heart: Regular rate and rhythm. Abdomen: Soft, nontender, no obvious distention. Musculoskeletal: No edema, cyanosis, or  clubbing. Neuro: Alert, answering all questions appropriately. Cranial nerves grossly intact. Skin: No rashes or petechiae noted. Psych: Normal affect.  LAB RESULTS:  Lab Results  Component Value Date   NA 135 06/13/2023   K 4.1 06/13/2023   CL 102 06/13/2023   CO2 25 06/13/2023   GLUCOSE 104 (H) 06/13/2023   BUN 22 06/13/2023   CREATININE 0.88 06/13/2023   CALCIUM 9.3 06/13/2023   PROT 6.7 06/13/2023   ALBUMIN 3.6 06/13/2023   AST 16 06/13/2023   ALT 18 06/13/2023   ALKPHOS 57 06/13/2023   BILITOT 0.2 06/13/2023   GFRNONAA >60 06/13/2023   GFRAA 40 (L) 03/01/2020    Lab Results  Component Value Date   WBC 6.6 06/13/2023   NEUTROABS 3.7 06/13/2023   HGB 13.0 06/13/2023   HCT 39.2 06/13/2023   MCV 97.0 06/13/2023   PLT 307 06/13/2023   Lab Results  Component  Value Date   TOTALPROTELP 6.0 02/28/2023   ALBUMINELP 3.5 05/16/2021   A1GS 0.2 05/16/2021   A2GS 0.6 05/16/2021   BETS 0.8 05/16/2021   GAMS 0.5 05/16/2021   MSPIKE Not Observed 05/16/2021   SPEI Comment 05/16/2021     STUDIES: No results found.  ASSESSMENT:  Multiple myeloma in relapse.  PLAN:    Multiple myeloma in relapse: Bone marrow biopsy on April 09, 2016 revealed 44% plasma cells and normal cytogenetics. Given the results of her metastatic bone survey on March 12, 2016, her elevated lambda free chains, and hypercalcemia, patient fit the criteria for multiple myeloma and was initially treated with single agent Revlimid .  She could not tolerate Revlimid  and was switched to pomalidomide  2 mg daily for 21 days with a 7-day break.  Metastatic bone survey on February 12, 2019 revealed new small lesions in her calvarium.  Patient's most recent M spike is now 0.0 and her immunoglobulins remain decreased.  Lambda free light chains have been trending down since March 2021 when Pomalyst  was initiated from 5600 to 345.6.  More recently, her lambda chains have trended up to 430.3 but this may be due to to  decreased absorption of Pomalyst  secondary to her persistent diarrhea.  Continue with treatment as prescribed.  Proceed with Zometa  today.  Return to clinic in 3 months with repeat laboratory work, further evaluation, and continuation of treatment.  Appreciate clinical pharmacy input.   Peripheral neuropathy: Chronic and changed.  Patient was previously given a referral to acupuncture which she said did not help much.  Hypocalcemia: Resolved.  Continue Zometa  as scheduled.   Recurrent UTIs: Patient is currently asymptomatic.  Continue monitoring and treatment per primary care. Diarrhea: Patient has been instructed to reschedule an appointment with GI for further evaluation.  Patient expressed understanding and was in agreement with this plan. She also understands that She can call clinic at any time with any questions, concerns, or complaints.    Evalene JINNY Reusing, MD 06/20/23 2:34 PM

## 2023-06-21 LAB — IMMUNOFIXATION ELECTROPHORESIS
IgA: 174 mg/dL (ref 64–422)
IgG (Immunoglobin G), Serum: 778 mg/dL (ref 586–1602)
IgM (Immunoglobulin M), Srm: 33 mg/dL (ref 26–217)
Total Protein ELP: 5.9 g/dL — ABNORMAL LOW (ref 6.0–8.5)

## 2023-06-28 ENCOUNTER — Other Ambulatory Visit: Payer: Self-pay | Admitting: *Deleted

## 2023-06-28 DIAGNOSIS — C9000 Multiple myeloma not having achieved remission: Secondary | ICD-10-CM

## 2023-06-28 MED ORDER — POMALIDOMIDE 2 MG PO CAPS: 2.0000 mg | ORAL_CAPSULE | Freq: Every day | ORAL | 0 refills | Status: DC

## 2023-06-30 ENCOUNTER — Other Ambulatory Visit: Payer: Self-pay | Admitting: Oncology

## 2023-06-30 DIAGNOSIS — C9000 Multiple myeloma not having achieved remission: Secondary | ICD-10-CM

## 2023-07-01 ENCOUNTER — Encounter: Payer: Self-pay | Admitting: Oncology

## 2023-07-01 ENCOUNTER — Ambulatory Visit: Payer: Medicare PPO | Attending: Cardiology | Admitting: Cardiology

## 2023-07-01 ENCOUNTER — Encounter: Payer: Self-pay | Admitting: Cardiology

## 2023-07-01 ENCOUNTER — Ambulatory Visit: Payer: Medicare PPO

## 2023-07-01 VITALS — BP 124/78 | HR 65 | Ht 60.0 in | Wt 128.4 lb

## 2023-07-01 DIAGNOSIS — R55 Syncope and collapse: Secondary | ICD-10-CM

## 2023-07-01 DIAGNOSIS — E7800 Pure hypercholesterolemia, unspecified: Secondary | ICD-10-CM | POA: Diagnosis not present

## 2023-07-01 NOTE — Progress Notes (Signed)
Cardiology Office Note:    Date:  07/01/2023   ID:  [name], DOB 25-Jan-1934, MRN [id number]  PCP:  Sallyanne Kuster, NP   [facility] Providers Cardiologist:  Debbe Odea, MD     Referring MD: Sallyanne Kuster, NP   Chief Complaint  Patient presents with   New Patient (Initial Visit)    Referred for cardiac evaluation of Syncope, unspecified syncope type.  Patient reports syncope episode 3 months ago.  Still having intermittent dizziness with near syncope episodes.  Orthostatic bp obtained today.  No personal cardiac history.        History of Present Illness:    Rhonda [name] is a 89 y.o. female with a hx of hypothyroidism, multiple myeloma presenting with syncope.  Patient was in her ER about 3 months ago when she suddenly passed out.  She states it was a hot day when she was working in her yard outside.  She lives next to her daughter and some friends who called EMS.  Before EMS presentation, family had patient sitting.  She did not lose consciousness.  She states feeling some dizziness prior to passing out.  Has not had any episodes since.  Endorse having a COVID infection a few weeks prior which might have prompted her symptoms.  Has occasional dizziness when she stands up too quickly from a seated position.  Also endorses ringing in her ear, worse with changing seasons.  Denies chest pain, denies palpitations, denies shortness of breath.  Outside echocardiogram 08/2021 normal systolic function EF 70%.  Mild LVH, impaired relaxation.  Aortic valve sclerosis with no stenosis, mild MR.  Past Medical History:  Diagnosis Date   Actinic keratosis    Arthritis    hands   Benign neoplasm of ascending colon    Cataract    GERD (gastroesophageal reflux disease)    Hyperlipemia    Hypertension 01/31/2015   Hypothyroidism    Melanoma (HCC) 04/28/2018   R mid to distal ant lat thigh. MM, SS, tumor thickness 0.72mm, antatomic level II. Excised: 05/13/18,  margins free   Multiple myeloma (HCC) 04/18/2016   Multiple myeloma in remission (HCC) 04/18/2016   Osteoporosis    SCC (squamous cell carcinoma) 06/17/2023   R volar forearm, [facility]   Shingles    Skin cancer    basal cell   Squamous cell carcinoma of skin 02/04/2014   Left pretibial. KA-like pattern   Squamous cell carcinoma of skin 09/15/2015   Right lat. superior ankle are. Superficial infiltration. Tx: [facility]   Squamous cell carcinoma of skin 10/15/2016   Left medial mid lower leg. Tx: [facility]   Squamous cell carcinoma of skin 11/27/2018   Right dorsum proximal forearm.   Squamous cell carcinoma of skin 09/09/2019   Right cheek lat. to mid nasolabial area. Tx: [facility]   Squamous cell carcinoma of skin 12/09/2019   right pretibial below knee   Squamous cell carcinoma of skin 06/25/2022   Left ant forearm - [facility]    Past Surgical History:  Procedure Laterality Date   BREAST EXCISIONAL BIOPSY Right 15+ yrs ago   EXCISIONAL - NEG   CATARACT EXTRACTION W/ INTRAOCULAR LENS IMPLANT     CHOLECYSTECTOMY N/A 03/14/2017   Procedure: LAPAROSCOPIC CHOLECYSTECTOMY;  Surgeon: Lattie Haw, MD;  Location: [facility];  Service: General;  Laterality: N/A;   COLONOSCOPY WITH PROPOFOL N/A 02/21/2015   Procedure: COLONOSCOPY WITH PROPOFOL;  Surgeon: Midge Minium, MD;  Location: [facility];  Service: Endoscopy;  Laterality: N/A;   LAPAROSCOPIC HYSTERECTOMY  1980   POLYPECTOMY  02/21/2015   Procedure: POLYPECTOMY;  Surgeon: Midge Minium, MD;  Location: [facility];  Service: Endoscopy;;    Current Medications: Current Meds  Medication Sig   aspirin EC 81 MG tablet Take 81 mg by mouth daily.   Cyanocobalamin (B-12 PO) Take 500 mcg by mouth daily.    EPINEPHrine 0.3 mg/0.3 mL IJ SOAJ injection    fexofenadine (ALLEGRA) 180 MG tablet Take 180 mg by mouth daily.   levothyroxine (SYNTHROID) 50 MCG tablet TAKE 1 TABLET BY MOUTH DAILY BEFORE BREAKFAST   Multiple Vitamins-Minerals (CENTRUM SILVER  PO) Take 1 tablet by mouth daily.   pomalidomide (POMALYST) 2 MG capsule Take 1 capsule (2 mg total) by mouth daily.   potassium chloride SA (KLOR-CON M) 20 MEQ tablet Take 1 tablet (20 mEq total) by mouth daily.   triamcinolone (NASACORT) 55 MCG/ACT AERO nasal inhaler Place 2 sprays into the nose 2 (two) times daily as needed.     Allergies:   Revlimid [lenalidomide]   Social History   Socioeconomic History   Marital status: Widowed    Spouse name: Not on file   Number of children: Not on file   Years of education: Not on file   Highest education level: Not on file  Occupational History   Not on file  Tobacco Use   Smoking status: Never   Smokeless tobacco: Never  Vaping Use   Vaping status: Never Used  Substance and Sexual Activity   Alcohol use: No    Alcohol/week: 0.0 standard drinks of alcohol   Drug use: No   Sexual activity: Not on file  Other Topics Concern   Not on file  Social History Narrative   Not on file   Social Drivers of Health   Financial Resource Strain: Low Risk  (03/26/2023)   Overall Financial Resource Strain (CARDIA)    Difficulty of Paying Living Expenses: Not hard at all  Food Insecurity: No Food Insecurity (03/26/2023)   Hunger Vital Sign    Worried About Running Out of Food in the Last Year: Never true    Ran Out of Food in the Last Year: Never true  Transportation Needs: No Transportation Needs (03/26/2023)   PRAPARE - Administrator, Civil Service (Medical): No    Lack of Transportation (Non-Medical): No  Physical Activity: Insufficiently Active (03/26/2023)   Exercise Vital Sign    Days of Exercise per Week: 2 days    Minutes of Exercise per Session: 60 min  Stress: Stress Concern Present (03/26/2023)   [employer] of Occupational Health - Occupational Stress Questionnaire    Feeling of Stress : To some extent  Social Connections: Moderately Isolated (03/26/2023)   Social Connection and Isolation Panel [NHANES]     Frequency of Communication with Friends and Family: More than three times a week    Frequency of Social Gatherings with Friends and Family: Once a week    Attends Religious Services: More than 4 times per year    Active Member of [employer] or Organizations: No    Attends Banker Meetings: Never    Marital Status: Widowed     Family History: The patient's family history includes Heart disease in her brother and mother; Stroke in her father. There is no history of Kidney cancer, Prostate cancer, Bladder Cancer, or Breast cancer.  ROS:   Please see the history of present illness.     All  other systems reviewed and are negative.  EKGs/Labs/Other Studies Reviewed:    The following studies were reviewed today:  EKG Interpretation Date/Time:  Monday July 01 2023 09:40:53 EST Ventricular Rate:  65 PR Interval:  206 QRS Duration:  80 QT Interval:  386 QTC Calculation: 401 R Axis:   30  Text Interpretation: Normal sinus rhythm Normal ECG Confirmed by Debbe Odea ([id number]) on 07/01/2023 10:10:26 AM    Recent Labs: 07/31/2022: Magnesium 2.1 06/13/2023: ALT 18; BUN 22; Creatinine, Ser 0.88; Hemoglobin 13.0; Platelets 307; Potassium 4.1; Sodium 135  Recent Lipid Panel    Component Value Date/Time   CHOL 213 (H) 03/17/2019 0903   TRIG 123 03/17/2019 0903   HDL 48 03/17/2019 0903   LDLCALC 143 (H) 03/17/2019 0903     Risk Assessment/Calculations:           Physical Exam:    VS:  BP 124/78 (BP Location: Left Arm, Patient Position: Sitting, Cuff Size: Normal)   Pulse 65   Ht 5' (1.524 m)   Wt 128 lb 6.4 oz (58.2 kg)   SpO2 98%   BMI 25.08 kg/m     Wt Readings from Last 3 Encounters:  07/01/23 128 lb 6.4 oz (58.2 kg)  06/20/23 126 lb (57.2 kg)  04/16/23 128 lb 6.4 oz (58.2 kg)     GEN:  Well nourished, well developed in no acute distress HEENT: Normal NECK: No JVD; No carotid bruits CARDIAC: RRR, no murmurs, rubs, gallops RESPIRATORY:  Clear to  auscultation without rales, wheezing or rhonchi  ABDOMEN: Soft, non-tender, non-distended MUSCULOSKELETAL:  No edema; No deformity  SKIN: Warm and dry NEUROLOGIC:  Alert and oriented x 3 PSYCHIATRIC:  Normal affect   ASSESSMENT:    1. Syncope, unspecified syncope type   2. Pure hypercholesterolemia    PLAN:    In order of problems listed above:  Syncope, etiology appears vasovagal versus orthostatic as it occurred during her heart Sunday daily.  Patient has not had any episodes since.  Orthostatic vitals today showed no evidence for orthostasis.  Has some ringing in your ears which may suggest positional vertigo.  Outside echocardiogram showed normal function with no significant structural abnormalities.  Place cardiac monitor to rule out any significant arrhythmias.  Patient advised on adequate hydration, advised to take time when standing up from seated position. Hyperlipidemia, will not recommend statin due to patient's age.    Follow up after cardiac monitor.      Medication Adjustments/Labs and Tests Ordered: Current medicines are reviewed at length with the patient today.  Concerns regarding medicines are outlined above.  Orders Placed This Encounter  Procedures   LONG TERM MONITOR (3-14 DAYS)   EKG 12-Lead   No orders of the defined types were placed in this encounter.   Patient Instructions  Medication Instructions:   Your physician recommends that you continue on your current medications as directed. Please refer to the Current Medication list given to you today.  *If you need a refill on your cardiac medications before your next appointment, please call your pharmacy*   Lab Work:  None Ordered  If you have labs (blood work) drawn today and your tests are completely normal, you will receive your results only by: MyChart Message (if you have MyChart) OR A paper copy in the mail If you have any lab test that is abnormal or we need to change your treatment, we  will call you to review the results.   Testing/Procedures:  Your physician  has recommended that you wear a Zio monitor.   This monitor is a medical device that records the heart's electrical activity. Doctors most often use these monitors to diagnose arrhythmias. Arrhythmias are problems with the speed or rhythm of the heartbeat. The monitor is a small device applied to your chest. You can wear one while you do your normal daily activities. While wearing this monitor if you have any symptoms to push the button and record what you felt. Once you have worn this monitor for the period of time provider prescribed (Usually 14 days), you will return the monitor device in the postage paid box. Once it is returned they will download the data collected and provide [place] with a report which the provider will then review and we will call you with those results. Important tips:  Avoid showering during the first 24 hours of wearing the monitor. Avoid excessive sweating to help maximize wear time. Do not submerge the device, no hot tubs, and no swimming pools. Keep any lotions or oils away from the patch. After 24 hours you may shower with the patch on. Take brief showers with your back facing the shower head.  Do not remove patch once it has been placed because that will interrupt data and decrease adhesive wear time. Push the button when you have any symptoms and write down what you were feeling. Once you have completed wearing your monitor, remove and place into box which has postage paid and place in your outgoing mailbox.  If for some reason you have misplaced your box then call our office and we can provide another box and/or mail it off for you.    Follow-Up: At [facility], you and your health needs are our priority.  As part of our continuing mission to provide you with exceptional heart care, we have created designated Provider Care Teams.  These Care Teams include your primary Cardiologist  (physician) and Advanced Practice Providers (APPs -  Physician Assistants and Nurse Practitioners) who all work together to provide you with the care you need, when you need it.  We recommend signing up for the patient portal called "MyChart".  Sign up information is provided on this After Visit Summary.  MyChart is used to connect with patients for Virtual Visits (Telemedicine).  Patients are able to view lab/test results, encounter notes, upcoming appointments, etc.  Non-urgent messages can be sent to your provider as well.   To learn more about what you can do with MyChart, go to [email or web address].    Your next appointment:   6 - 8  week(s)  Provider:   You may see Debbe Odea, MD or one of the following Advanced Practice Providers on your designated Care Team:   Nicolasa Ducking, NP Eula Listen, PA-C Cadence Fransico Michael, PA-C Charlsie Quest, NP Carlos Levering, NP     Signed, Debbe Odea, MD  07/01/2023 10:43 AM    [facility]

## 2023-07-01 NOTE — Patient Instructions (Signed)
 Medication Instructions:   Your physician recommends that you continue on your current medications as directed. Please refer to the Current Medication list given to you today.  *If you need a refill on your cardiac medications before your next appointment, please call your pharmacy*   Lab Work:  None Ordered  If you have labs (blood work) drawn today and your tests are completely normal, you will receive your results only by: MyChart Message (if you have MyChart) OR A paper copy in the mail If you have any lab test that is abnormal or we need to change your treatment, we will call you to review the results.   Testing/Procedures:  Your physician has recommended that you wear a Zio monitor.   This monitor is a medical device that records the heart's electrical activity. Doctors most often use these monitors to diagnose arrhythmias. Arrhythmias are problems with the speed or rhythm of the heartbeat. The monitor is a small device applied to your chest. You can wear one while you do your normal daily activities. While wearing this monitor if you have any symptoms to push the button and record what you felt. Once you have worn this monitor for the period of time provider prescribed (Usually 14 days), you will return the monitor device in the postage paid box. Once it is returned they will download the data collected and provide [place] with a report which the provider will then review and we will call you with those results. Important tips:  Avoid showering during the first 24 hours of wearing the monitor. Avoid excessive sweating to help maximize wear time. Do not submerge the device, no hot tubs, and no swimming pools. Keep any lotions or oils away from the patch. After 24 hours you may shower with the patch on. Take brief showers with your back facing the shower head.  Do not remove patch once it has been placed because that will interrupt data and decrease adhesive wear time. Push the button  when you have any symptoms and write down what you were feeling. Once you have completed wearing your monitor, remove and place into box which has postage paid and place in your outgoing mailbox.  If for some reason you have misplaced your box then call our office and we can provide another box and/or mail it off for you.     Follow-Up: At [facility], you and your health needs are our priority.  As part of our continuing mission to provide you with exceptional heart care, we have created designated Provider Care Teams.  These Care Teams include your primary Cardiologist (physician) and Advanced Practice Providers (APPs -  Physician Assistants and Nurse Practitioners) who all work together to provide you with the care you need, when you need it.  We recommend signing up for the patient portal called "MyChart".  Sign up information is provided on this After Visit Summary.  MyChart is used to connect with patients for Virtual Visits (Telemedicine).  Patients are able to view lab/test results, encounter notes, upcoming appointments, etc.  Non-urgent messages can be sent to your provider as well.   To learn more about what you can do with MyChart, go to [email or web address].    Your next appointment:   6 - 8 week(s)  Provider:   You may see Debbe Odea, MD or one of the following Advanced Practice Providers on your designated Care Team:   Nicolasa Ducking, NP Eula Listen, PA-C Cadence Fransico Michael, PA-C Charlsie Quest, NP Gavin Pound  Daine Floras, NP

## 2023-07-22 ENCOUNTER — Other Ambulatory Visit: Payer: Self-pay | Admitting: *Deleted

## 2023-07-22 DIAGNOSIS — C9000 Multiple myeloma not having achieved remission: Secondary | ICD-10-CM

## 2023-07-22 MED ORDER — POMALIDOMIDE 2 MG PO CAPS
2.0000 mg | ORAL_CAPSULE | Freq: Every day | ORAL | 0 refills | Status: DC
Start: 2023-07-22 — End: 2023-08-21

## 2023-07-23 ENCOUNTER — Other Ambulatory Visit: Payer: Self-pay | Admitting: Oncology

## 2023-07-23 DIAGNOSIS — C9000 Multiple myeloma not having achieved remission: Secondary | ICD-10-CM

## 2023-07-24 ENCOUNTER — Encounter: Payer: Self-pay | Admitting: Nurse Practitioner

## 2023-07-24 ENCOUNTER — Telehealth: Payer: Medicare PPO | Admitting: Nurse Practitioner

## 2023-07-24 VITALS — Ht 60.0 in | Wt 123.0 lb

## 2023-07-24 DIAGNOSIS — J0100 Acute maxillary sinusitis, unspecified: Secondary | ICD-10-CM | POA: Diagnosis not present

## 2023-07-24 DIAGNOSIS — R55 Syncope and collapse: Secondary | ICD-10-CM | POA: Diagnosis not present

## 2023-07-24 MED ORDER — LEVOFLOXACIN 500 MG PO TABS: 500.0000 mg | ORAL_TABLET | Freq: Every day | ORAL | 0 refills | Status: AC

## 2023-07-24 MED ORDER — HYDROCOD POLI-CHLORPHE POLI ER 10-8 MG/5ML PO SUER: 5.0000 mL | Freq: Two times a day (BID) | ORAL | 0 refills | Status: DC | PRN

## 2023-07-24 NOTE — Progress Notes (Unsigned)
[facility] [address] [place], [place] [address]  Internal MEDICINE  Telephone Visit  Patient Name: Rhonda [name]  [id number]  [id number]  Date of Service: 07/24/2023  I connected with the patient at 1245 by telephone and verified the patients identity using two identifiers.   I discussed the limitations, risks, security and privacy concerns of performing an evaluation and management service by telephone and the availability of in person appointments. I also discussed with the patient that there may be a patient responsible charge related to the service.  The patient expressed understanding and agrees to proceed.    Chief Complaint  Patient presents with   Telephone Assessment    [id number]   Telephone Screen   Virtual Visit    Covid and Flu test is negative    Sinusitis   Cough    Sinusitis Associated symptoms include congestion, coughing, headaches and sinus pressure. Pertinent negatives include no chills, ear pain, shortness of breath or sore throat.  Cough Associated symptoms include headaches, myalgias (body aches), postnasal drip and rhinorrhea. Pertinent negatives include no chest pain, chills, ear pain, fever, sore throat, shortness of breath or wheezing.   Rhonda [name] presents for a telehealth virtual visit for symptoms of sinusitis Onset of symptoms was about 2 days ago Negative for covid and flu Started taking mucinex  --reports cough, fatigue, sinus pressure, nasal congestion,    Current Medication: Outpatient Encounter Medications as of 07/24/2023  Medication Sig Note   aspirin EC 81 MG tablet Take 81 mg by mouth daily.    chlorpheniramine-HYDROcodone (TUSSIONEX) 10-8 MG/5ML Take 5 mLs by mouth every 12 (twelve) hours as needed for cough.    Cyanocobalamin (B-12 PO) Take 500 mcg by mouth daily.     EPINEPHrine 0.3 mg/0.3 mL IJ SOAJ injection Inject 0.3 mg into the muscle as needed for anaphylaxis. 08/13/2022: Once a week.   fexofenadine (ALLEGRA) 180  MG tablet Take 180 mg by mouth daily.    levofloxacin (LEVAQUIN) 500 MG tablet Take 1 tablet (500 mg total) by mouth daily for 7 days. Take with food    levothyroxine (SYNTHROID) 50 MCG tablet TAKE 1 TABLET BY MOUTH DAILY BEFORE BREAKFAST    Multiple Vitamins-Minerals (CENTRUM SILVER PO) Take 1 tablet by mouth daily.    pomalidomide (POMALYST) 2 MG capsule Take 1 capsule (2 mg total) by mouth daily. 21 days on, 7 days off    potassium chloride SA (KLOR-CON M) 20 MEQ tablet Take 1 tablet (20 mEq total) by mouth daily.    triamcinolone (NASACORT) 55 MCG/ACT AERO nasal inhaler Place 2 sprays into the nose 2 (two) times daily as needed.    No facility-administered encounter medications on file as of 07/24/2023.    Surgical History: Past Surgical History:  Procedure Laterality Date   BREAST EXCISIONAL BIOPSY Right 15+ yrs ago   EXCISIONAL - NEG   CATARACT EXTRACTION W/ INTRAOCULAR LENS IMPLANT     CHOLECYSTECTOMY N/A 03/14/2017   Procedure: LAPAROSCOPIC CHOLECYSTECTOMY;  Surgeon: Lattie Haw, MD;  Location: [facility];  Service: General;  Laterality: N/A;   COLONOSCOPY WITH PROPOFOL N/A 02/21/2015   Procedure: COLONOSCOPY WITH PROPOFOL;  Surgeon: Midge Minium, MD;  Location: [facility];  Service: Endoscopy;  Laterality: N/A;   LAPAROSCOPIC HYSTERECTOMY  1980   POLYPECTOMY  02/21/2015   Procedure: POLYPECTOMY;  Surgeon: Midge Minium, MD;  Location: [facility];  Service: Endoscopy;;    Medical History: Past Medical History:  Diagnosis Date   Actinic keratosis  Arthritis    hands   Benign neoplasm of ascending colon    Cataract    GERD (gastroesophageal reflux disease)    Hyperlipemia    Hypertension 01/31/2015   Hypothyroidism    Melanoma (HCC) 04/28/2018   R mid to distal ant lat thigh. MM, SS, tumor thickness 0.74mm, antatomic level II. Excised: 05/13/18, margins free   Multiple myeloma (HCC) 04/18/2016   Multiple myeloma in remission (HCC) 04/18/2016    Osteoporosis    SCC (squamous cell carcinoma) 06/17/2023   R volar forearm, [facility]   Shingles    Skin cancer    basal cell   Squamous cell carcinoma of skin 02/04/2014   Left pretibial. KA-like pattern   Squamous cell carcinoma of skin 09/15/2015   Right lat. superior ankle are. Superficial infiltration. Tx: [facility]   Squamous cell carcinoma of skin 10/15/2016   Left medial mid lower leg. Tx: [facility]   Squamous cell carcinoma of skin 11/27/2018   Right dorsum proximal forearm.   Squamous cell carcinoma of skin 09/09/2019   Right cheek lat. to mid nasolabial area. Tx: [facility]   Squamous cell carcinoma of skin 12/09/2019   right pretibial below knee   Squamous cell carcinoma of skin 06/25/2022   Left ant forearm - [facility]    Family History: Family History  Problem Relation Age of Onset   Heart disease Mother    Stroke Father    Heart disease Brother    Kidney cancer Neg Hx    Prostate cancer Neg Hx    Bladder Cancer Neg Hx    Breast cancer Neg Hx     Social History   Socioeconomic History   Marital status: Widowed    Spouse name: Not on file   Number of children: Not on file   Years of education: Not on file   Highest education level: Not on file  Occupational History   Not on file  Tobacco Use   Smoking status: Never   Smokeless tobacco: Never  Vaping Use   Vaping status: Never Used  Substance and Sexual Activity   Alcohol use: No    Alcohol/week: 0.0 standard drinks of alcohol   Drug use: No   Sexual activity: Not on file  Other Topics Concern   Not on file  Social History Narrative   Not on file   Social Drivers of Health   Financial Resource Strain: Low Risk  (03/26/2023)   Overall Financial Resource Strain (CARDIA)    Difficulty of Paying Living Expenses: Not hard at all  Food Insecurity: No Food Insecurity (03/26/2023)   Hunger Vital Sign    Worried About Running Out of Food in the Last Year: Never true    Ran Out of Food in the Last Year: Never true   Transportation Needs: No Transportation Needs (03/26/2023)   PRAPARE - Administrator, Civil Service (Medical): No    Lack of Transportation (Non-Medical): No  Physical Activity: Insufficiently Active (03/26/2023)   Exercise Vital Sign    Days of Exercise per Week: 2 days    Minutes of Exercise per Session: 60 min  Stress: Stress Concern Present (03/26/2023)   [employer] of Occupational Health - Occupational Stress Questionnaire    Feeling of Stress : To some extent  Social Connections: Moderately Isolated (03/26/2023)   Social Connection and Isolation Panel [NHANES]    Frequency of Communication with Friends and Family: More than three times a week    Frequency of Social Gatherings  with Friends and Family: Once a week    Attends Religious Services: More than 4 times per year    Active Member of Clubs or Organizations: No    Attends Banker Meetings: Never    Marital Status: Widowed  Intimate Partner Violence: Not At Risk (03/26/2023)   Humiliation, Afraid, Rape, and Kick questionnaire    Fear of Current or Ex-Partner: No    Emotionally Abused: No    Physically Abused: No    Sexually Abused: No      Review of Systems  Constitutional:  Positive for appetite change and fatigue. Negative for chills and fever.  HENT:  Positive for congestion, postnasal drip, rhinorrhea, sinus pressure, sinus pain and voice change. Negative for ear pain, sore throat and trouble swallowing.   Eyes:  Positive for discharge.  Respiratory:  Positive for cough. Negative for chest tightness, shortness of breath and wheezing.   Cardiovascular: Negative.  Negative for chest pain and palpitations.  Gastrointestinal:  Positive for diarrhea. Negative for nausea and vomiting.  Musculoskeletal:  Positive for myalgias (body aches).  Neurological:  Positive for headaches.    Vital Signs: Ht 5' (1.524 m)   Wt 123 lb (55.8 kg)   BMI 24.02 kg/m    Observation/Objective: Rhonda [name]  is alert and oriented. No acute distress noted.     Assessment/Plan: 1. Acute non-recurrent maxillary sinusitis (Primary) Levofloxacin prescribed, take until gone. Tussionex prescribed for relief of cough.  - levofloxacin (LEVAQUIN) 500 MG tablet; Take 1 tablet (500 mg total) by mouth daily for 7 days. Take with food  Dispense: 7 tablet; Refill: 0 - chlorpheniramine-HYDROcodone (TUSSIONEX) 10-8 MG/5ML; Take 5 mLs by mouth every 12 (twelve) hours as needed for cough.  Dispense: 140 mL; Refill: 0   General Counseling: Rhonda [name] verbalizes understanding of the findings of today's phone visit and agrees with plan of treatment. I have discussed any further diagnostic evaluation that may be needed or ordered today. We also reviewed her medications today. Rhonda [name] has been encouraged to call the office with any questions or concerns that should arise related to todays visit.  No follow-ups on file.   No orders of the defined types were placed in this encounter.   Meds ordered this encounter  Medications   levofloxacin (LEVAQUIN) 500 MG tablet    Sig: Take 1 tablet (500 mg total) by mouth daily for 7 days. Take with food    Dispense:  7 tablet    Refill:  0   chlorpheniramine-HYDROcodone (TUSSIONEX) 10-8 MG/5ML    Sig: Take 5 mLs by mouth every 12 (twelve) hours as needed for cough.    Dispense:  140 mL    Refill:  0    Time spent:10 Minutes Time spent with patient included reviewing progress notes, labs, imaging studies, and discussing plan for follow up.  [place] Controlled Substance Database was reviewed by me for overdose risk score (ORS) if appropriate.  This patient was seen by Sallyanne Kuster, FNP-C in collaboration with Dr. Beverely Risen as a part of collaborative care agreement.  Mathayus Stanbery R. Tedd Sias, MSN, FNP-C Internal medicine

## 2023-07-25 ENCOUNTER — Encounter: Payer: Self-pay | Admitting: Nurse Practitioner

## 2023-07-29 DIAGNOSIS — R55 Syncope and collapse: Secondary | ICD-10-CM

## 2023-08-08 ENCOUNTER — Ambulatory Visit: Payer: Medicare PPO | Admitting: Dermatology

## 2023-08-10 NOTE — Progress Notes (Unsigned)
 Cardiology Clinic Note   Date: 08/12/2023 ID: Rhonda [name], DOB 01-Nov-1933, MRN [id number]  Primary Cardiologist:  Debbe Odea, MD  Chief Complaint   Rhonda [name] is a 89 y.o. female who presents to the clinic today for follow up after Zio monitor.   Patient Profile   Rhonda [name] is followed by Dr. Azucena Cecil for the history outlined below.      Past medical history significant for: Syncope.  14-day ZIO 07/29/2023: HR 46 to 185 bpm, average 69 bpm.  Predominantly sinus rhythm.  1 run of VT lasting 16 beats with max rate 164 bpm.  93 runs of SVT fastest lasting 14 beats max rate 185 bpm, longest 21.8 seconds average rate 114 bpm.  Occasional PACs.  Frequent PVCs (6.3% burden).  No A-fib/flutter.  No findings suggesting etiology of syncope. Orthostatic hypotension. Hypertension. Hyperlipidemia. GERD. Hypothyroidism. Multiple myeloma.  In summary, patient was first evaluated by Dr. Azucena Cecil on 07/01/2023 for syncope at the request of Sallyanne Kuster, NP.  Patient had an ED visit 3 months prior when she suddenly passed out.  She stated it was a hot day and she was working outside in her yard.  She reports she did not lose consciousness stating she felt dizzy prior to passing out.  She endorsed a COVID infection a few weeks prior to episode.  She reported positional dizziness with particularly when standing up too quickly and ringing in her ears worse with changing seasons.  Echo performed at an outside facility in March 2023 showed EF 70%, mild LVH, impaired relaxation, mild MR, aortic valve sclerosis without stenosis.  She wore a 14-day ZIO with no findings suggesting etiology of syncope as detailed above.     History of Present Illness    Today, patient is here alone. She has had no other syncopal episodes. She is more careful with position changes to avoid lightheadedness or dizziness. She continues with generalized weakness since having Covid in October 2024.  Prior to that she was participating in [employer] twice a week but has not returned. She plans to start back tomorrow. She denies chest pain, tightness of pressure. She has noticed more lower extremity edema. Lower extremities are typically normal in the morning and edema progresses throughout the day. She also feels more dyspneic with less exertion over the past couple of months and finds she needs more rest breaks as she is completing household activities. She reports having a long history of palpitations that she is aware of particularly at night or when she is sitting quietly. They are not particularly bothersome to her.     ROS: All other systems reviewed and are otherwise negative except as noted in History of Present Illness.  EKGs/Labs Reviewed    EKG Interpretation Date/Time:  Monday August 12 2023 10:50:10 EST Ventricular Rate:  71 PR Interval:  210 QRS Duration:  74 QT Interval:  380 QTC Calculation: 412 R Axis:   27  Text Interpretation: Sinus rhythm with 1st degree A-V block with occasional Premature ventricular complexes and Premature atrial complexes When compared with ECG of 01-Jul-2023 09:40, Premature ventricular complexes are now Present Premature atrial complexes are now Present Confirmed by Carlos Levering [phone]) on 08/12/2023 10:53:03 AM   06/13/2023: ALT 18; AST 16; BUN 22; Creatinine, Ser 0.88; Potassium 4.1; Sodium 135   06/13/2023: Hemoglobin 13.0; WBC 6.6    Physical Exam    VS:  BP 118/68 (BP Location: Left Arm)   Pulse 71   Ht  5' (1.524 m)   Wt 125 lb 9.6 oz (57 kg)   SpO2 98%   BMI 24.53 kg/m  , BMI Body mass index is 24.53 kg/m.  GEN: Well nourished, well developed, in no acute distress. Neck: No JVD or carotid bruits. Cardiac:  RRR. Occasional extrasystole. No murmurs. No rubs or gallops.   Respiratory:  Respirations regular and unlabored. Clear to auscultation without rales, wheezing or rhonchi. GI: Soft, nontender, nondistended. Extremities:  Radials/DP/PT 2+ and equal bilaterally. No clubbing or cyanosis. No edema.  Skin: Warm and dry, no rash. Neuro: Strength intact.  Assessment & Plan   Syncope Patient had an episode of syncope in the Fall 2024.  14-day ZIO demonstrated runs of SVT, frequent PVCs (6.3% burden).  No other findings suggesting etiology of syncope.  Patient denies further episodes of syncope since October.  -Schedule echo.   SVT/PVCs 14-day ZIO February 2025 showed 93 runs of SVT, 6.3% burden of PVCs.  Patient reports a long history of palpitations particularly at night or when she is sitting quietly. They are not particularly bothersome to her. EKG today demonstrates sinus rhythm with PACs and PVCs.  -Patient is not interested in BB or CCB to suppress PVCs at this time.  DOE Patient reports increased dyspnea with less exertion over the past couple of months. She had Covid in October 2024 and has experienced generalized weakness and fatigue since. She has not been exercising like she was prior to Covid. She finds she needs more rest breaks when performing activities around her home. Euvolemic and well compensated on exam.  -Schedule echo.    Disposition: Echo. Return in 6 months or sooner as needed.          Signed, Etta Grandchild. Umaiza Matusik, DNP, NP-C

## 2023-08-12 ENCOUNTER — Ambulatory Visit: Payer: Medicare PPO | Attending: Student | Admitting: Student

## 2023-08-12 ENCOUNTER — Encounter: Payer: Self-pay | Admitting: Student

## 2023-08-12 VITALS — BP 118/68 | HR 71 | Ht 60.0 in | Wt 125.6 lb

## 2023-08-12 DIAGNOSIS — R0609 Other forms of dyspnea: Secondary | ICD-10-CM | POA: Diagnosis not present

## 2023-08-12 DIAGNOSIS — I493 Ventricular premature depolarization: Secondary | ICD-10-CM

## 2023-08-12 DIAGNOSIS — I471 Supraventricular tachycardia, unspecified: Secondary | ICD-10-CM

## 2023-08-12 DIAGNOSIS — R55 Syncope and collapse: Secondary | ICD-10-CM | POA: Diagnosis not present

## 2023-08-12 NOTE — Patient Instructions (Signed)
 Medication Instructions:  Your Physician recommend you continue on your current medication as directed.    *If you need a refill on your cardiac medications before your next appointment, please call your pharmacy*   Lab Work: None ordered at this time   Testing/Procedures: Your physician has requested that you have an echocardiogram. Echocardiography is a painless test that uses sound waves to create images of your heart. It provides your doctor with information about the size and shape of your heart and how well your heart's chambers and valves are working.   You may receive an ultrasound enhancing agent through an IV if needed to better visualize your heart during the echo. This procedure takes approximately one hour.  There are no restrictions for this procedure.  This will take place at [address] [address], [place] [address]   Follow-Up: At [facility], you and your health needs are our priority.  As part of our continuing mission to provide you with exceptional heart care, we have created designated Provider Care Teams.  These Care Teams include your primary Cardiologist (physician) and Advanced Practice Providers (APPs -  Physician Assistants and Nurse Practitioners) who all work together to provide you with the care you need, when you need it.   Your next appointment:   6 month(s)  Provider:   You may see Debbe Odea, MD or one of the following Advanced Practice Providers on your designated Care Team:   Nicolasa Ducking, NP Eula Listen, PA-C Cadence Fransico Michael, PA-C Charlsie Quest, NP Carlos Levering, NP

## 2023-08-19 DIAGNOSIS — H40153 Residual stage of open-angle glaucoma, bilateral: Secondary | ICD-10-CM | POA: Diagnosis not present

## 2023-08-21 ENCOUNTER — Other Ambulatory Visit: Payer: Self-pay

## 2023-08-21 ENCOUNTER — Other Ambulatory Visit: Payer: Self-pay | Admitting: Oncology

## 2023-08-21 DIAGNOSIS — C9000 Multiple myeloma not having achieved remission: Secondary | ICD-10-CM

## 2023-09-09 ENCOUNTER — Ambulatory Visit: Attending: Student

## 2023-09-09 DIAGNOSIS — I493 Ventricular premature depolarization: Secondary | ICD-10-CM

## 2023-09-09 DIAGNOSIS — R0609 Other forms of dyspnea: Secondary | ICD-10-CM | POA: Diagnosis not present

## 2023-09-09 LAB — ECHOCARDIOGRAM COMPLETE
AR max vel: 1.87 cm2
AV Area VTI: 1.92 cm2
AV Area mean vel: 1.88 cm2
AV Mean grad: 5 mmHg
AV Peak grad: 9.7 mmHg
Ao pk vel: 1.56 m/s
Area-P 1/2: 3.53 cm2
S' Lateral: 2.7 cm

## 2023-09-16 ENCOUNTER — Other Ambulatory Visit: Payer: Self-pay | Admitting: Nurse Practitioner

## 2023-09-16 DIAGNOSIS — E782 Mixed hyperlipidemia: Secondary | ICD-10-CM | POA: Diagnosis not present

## 2023-09-17 LAB — LIPID PANEL
Chol/HDL Ratio: 3.6 ratio (ref 0.0–4.4)
Cholesterol, Total: 161 mg/dL (ref 100–199)
HDL: 45 mg/dL (ref >39)
LDL Chol Calc (NIH): 99 mg/dL (ref 0–99)
Triglycerides: 88 mg/dL (ref 0–149)
VLDL Cholesterol Cal: 17 mg/dL (ref 5–40)

## 2023-09-18 ENCOUNTER — Other Ambulatory Visit: Payer: Self-pay | Admitting: *Deleted

## 2023-09-18 ENCOUNTER — Other Ambulatory Visit: Payer: Self-pay | Admitting: Oncology

## 2023-09-18 DIAGNOSIS — C9000 Multiple myeloma not having achieved remission: Secondary | ICD-10-CM

## 2023-09-19 ENCOUNTER — Inpatient Hospital Stay: Payer: Medicare PPO | Attending: Oncology

## 2023-09-19 ENCOUNTER — Encounter: Payer: Self-pay | Admitting: Oncology

## 2023-09-19 DIAGNOSIS — Z79899 Other long term (current) drug therapy: Secondary | ICD-10-CM | POA: Diagnosis not present

## 2023-09-19 DIAGNOSIS — R197 Diarrhea, unspecified: Secondary | ICD-10-CM | POA: Diagnosis not present

## 2023-09-19 DIAGNOSIS — Z85820 Personal history of malignant melanoma of skin: Secondary | ICD-10-CM | POA: Insufficient documentation

## 2023-09-19 DIAGNOSIS — G629 Polyneuropathy, unspecified: Secondary | ICD-10-CM | POA: Insufficient documentation

## 2023-09-19 DIAGNOSIS — C9000 Multiple myeloma not having achieved remission: Secondary | ICD-10-CM

## 2023-09-19 DIAGNOSIS — Z85828 Personal history of other malignant neoplasm of skin: Secondary | ICD-10-CM | POA: Diagnosis not present

## 2023-09-19 DIAGNOSIS — Z87440 Personal history of urinary (tract) infections: Secondary | ICD-10-CM | POA: Insufficient documentation

## 2023-09-19 DIAGNOSIS — I10 Essential (primary) hypertension: Secondary | ICD-10-CM | POA: Diagnosis not present

## 2023-09-19 DIAGNOSIS — C9002 Multiple myeloma in relapse: Secondary | ICD-10-CM | POA: Diagnosis not present

## 2023-09-19 LAB — CMP (CANCER CENTER ONLY)
ALT: 17 U/L (ref 0–44)
AST: 18 U/L (ref 15–41)
Albumin: 3.8 g/dL (ref 3.5–5.0)
Alkaline Phosphatase: 50 U/L (ref 38–126)
Anion gap: 7 (ref 5–15)
BUN: 21 mg/dL (ref 8–23)
CO2: 22 mmol/L (ref 22–32)
Calcium: 8.8 mg/dL — ABNORMAL LOW (ref 8.9–10.3)
Chloride: 109 mmol/L (ref 98–111)
Creatinine: 0.78 mg/dL (ref 0.44–1.00)
GFR, Estimated: >60 mL/min (ref >60)
Glucose, Bld: 104 mg/dL — ABNORMAL HIGH (ref 70–99)
Potassium: 4 mmol/L (ref 3.5–5.1)
Sodium: 138 mmol/L (ref 135–145)
Total Bilirubin: 0.6 mg/dL (ref 0.0–1.2)
Total Protein: 6.3 g/dL — ABNORMAL LOW (ref 6.5–8.1)

## 2023-09-19 LAB — CBC WITH DIFFERENTIAL/PLATELET
Abs Immature Granulocytes: 0.03 10*3/uL (ref 0.00–0.07)
Basophils Absolute: 0.1 10*3/uL (ref 0.0–0.1)
Basophils Relative: 2 %
Eosinophils Absolute: 0 10*3/uL (ref 0.0–0.5)
Eosinophils Relative: 1 %
HCT: 38.3 % (ref 36.0–46.0)
Hemoglobin: 12.7 g/dL (ref 12.0–15.0)
Immature Granulocytes: 1 %
Lymphocytes Relative: 20 %
Lymphs Abs: 1.2 10*3/uL (ref 0.7–4.0)
MCH: 32.9 pg (ref 26.0–34.0)
MCHC: 33.2 g/dL (ref 30.0–36.0)
MCV: 99.2 fL (ref 80.0–100.0)
Monocytes Absolute: 0.7 10*3/uL (ref 0.1–1.0)
Monocytes Relative: 11 %
Neutro Abs: 4.2 10*3/uL (ref 1.7–7.7)
Neutrophils Relative %: 65 %
Platelets: 202 10*3/uL (ref 150–400)
RBC: 3.86 MIL/uL — ABNORMAL LOW (ref 3.87–5.11)
RDW: 13.2 % (ref 11.5–15.5)
WBC: 6.3 10*3/uL (ref 4.0–10.5)
nRBC: 0 % (ref 0.0–0.2)

## 2023-09-20 LAB — KAPPA/LAMBDA LIGHT CHAINS
Kappa free light chain: 27.7 mg/L — ABNORMAL HIGH (ref 3.3–19.4)
Kappa, lambda light chain ratio: 0.07 — ABNORMAL LOW (ref 0.26–1.65)
Lambda free light chains: 407.9 mg/L — ABNORMAL HIGH (ref 5.7–26.3)

## 2023-09-20 LAB — IGG, IGA, IGM
IgA: 120 mg/dL (ref 64–422)
IgG (Immunoglobin G), Serum: 692 mg/dL (ref 586–1602)
IgM (Immunoglobulin M), Srm: 37 mg/dL (ref 26–217)

## 2023-09-22 LAB — IMMUNOFIXATION ELECTROPHORESIS
IgA: 118 mg/dL (ref 64–422)
IgG (Immunoglobin G), Serum: 722 mg/dL (ref 586–1602)
IgM (Immunoglobulin M), Srm: 39 mg/dL (ref 26–217)
Total Protein ELP: 5.8 g/dL — ABNORMAL LOW (ref 6.0–8.5)

## 2023-09-24 ENCOUNTER — Encounter: Payer: Self-pay | Admitting: Nurse Practitioner

## 2023-09-24 ENCOUNTER — Ambulatory Visit: Payer: Medicare PPO | Admitting: Nurse Practitioner

## 2023-09-24 VITALS — BP 122/74 | HR 71 | Temp 98.4°F | Resp 16 | Ht 60.0 in | Wt 127.6 lb

## 2023-09-24 DIAGNOSIS — R053 Chronic cough: Secondary | ICD-10-CM | POA: Diagnosis not present

## 2023-09-24 DIAGNOSIS — N289 Disorder of kidney and ureter, unspecified: Secondary | ICD-10-CM

## 2023-09-24 DIAGNOSIS — E039 Hypothyroidism, unspecified: Secondary | ICD-10-CM

## 2023-09-24 DIAGNOSIS — J301 Allergic rhinitis due to pollen: Secondary | ICD-10-CM

## 2023-09-24 MED ORDER — LEVOCETIRIZINE DIHYDROCHLORIDE 5 MG PO TABS: 5.0000 mg | ORAL_TABLET | Freq: Every evening | ORAL | 1 refills | Status: DC

## 2023-09-24 NOTE — Progress Notes (Signed)
 [facility] [address] [place], [place] [address]  Internal MEDICINE  Office Visit Note  Patient Name: Rhonda [name]  [id number]  [id number]  Date of Service: 09/24/2023  Chief Complaint  Patient presents with   Hyperlipidemia   Hypertension   Gastroesophageal Reflux   Follow-up    HPI [name] presents for a follow-up visit for allergic rhinitis, hypothyroidism, cough, and review labs.  Allergic rhinitis -- allegra has not been helping. Xyzal has helped in the past. Has been taking mucinex but this makes her feel jittery.  Hypothyroidism -- takes levothyroxine 50 mcg daily.  Persistent cough -- has cough medication at home if she needs it.  Abnormal kidney function -- is still on pomalyst so her oncologist is regularly checking her kidney function labs. Last labs were last week and kidney function was normal.  Cholesterol panel is normal   Current Medication: Outpatient Encounter Medications as of 09/24/2023  Medication Sig   aspirin EC 81 MG tablet Take 81 mg by mouth daily.   Cyanocobalamin (B-12 PO) Take 500 mcg by mouth daily.    EPINEPHrine 0.3 mg/0.3 mL IJ SOAJ injection Inject 0.3 mg into the muscle as needed for anaphylaxis.   fexofenadine (ALLEGRA) 180 MG tablet Take 180 mg by mouth daily.   levocetirizine (XYZAL) 5 MG tablet Take 1 tablet (5 mg total) by mouth every evening.   levothyroxine (SYNTHROID) 50 MCG tablet TAKE 1 TABLET BY MOUTH DAILY BEFORE BREAKFAST   Multiple Vitamins-Minerals (CENTRUM SILVER PO) Take 1 tablet by mouth daily.   POMALYST 2 MG capsule TAKE 1 CAPSULE BY MOUTH EVERY DAY FOR 21 DAYS ON, 7 DAYS OFF   triamcinolone (NASACORT) 55 MCG/ACT AERO nasal inhaler Place 2 sprays into the nose 2 (two) times daily as needed.   No facility-administered encounter medications on file as of 09/24/2023.    Surgical History: Past Surgical History:  Procedure Laterality Date   BREAST EXCISIONAL BIOPSY Right 15+ yrs ago   EXCISIONAL - NEG    CATARACT EXTRACTION W/ INTRAOCULAR LENS IMPLANT     CHOLECYSTECTOMY N/A 03/14/2017   Procedure: LAPAROSCOPIC CHOLECYSTECTOMY;  Surgeon: Claudia Cuff, MD;  Location: [facility];  Service: General;  Laterality: N/A;   COLONOSCOPY WITH PROPOFOL N/A 02/21/2015   Procedure: COLONOSCOPY WITH PROPOFOL;  Surgeon: Marnee Sink, MD;  Location: [facility];  Service: Endoscopy;  Laterality: N/A;   LAPAROSCOPIC HYSTERECTOMY  1980   POLYPECTOMY  02/21/2015   Procedure: POLYPECTOMY;  Surgeon: Marnee Sink, MD;  Location: [facility];  Service: Endoscopy;;    Medical History: Past Medical History:  Diagnosis Date   Actinic keratosis    Arthritis    hands   Benign neoplasm of ascending colon    Cataract    GERD (gastroesophageal reflux disease)    Hyperlipemia    Hypertension 01/31/2015   Hypothyroidism    Melanoma (HCC) 04/28/2018   R mid to distal ant lat thigh. MM, SS, tumor thickness 0.57mm, antatomic level II. Excised: 05/13/18, margins free   Multiple myeloma (HCC) 04/18/2016   Multiple myeloma in remission (HCC) 04/18/2016   Osteoporosis    SCC (squamous cell carcinoma) 06/17/2023   R volar forearm, EDC   Shingles    Skin cancer    basal cell   Squamous cell carcinoma of skin 02/04/2014   Left pretibial. KA-like pattern   Squamous cell carcinoma of skin 09/15/2015   Right lat. superior ankle are. Superficial infiltration. Tx: EDC   Squamous cell carcinoma of  skin 10/15/2016   Left medial mid lower leg. Tx: EDC   Squamous cell carcinoma of skin 11/27/2018   Right dorsum proximal forearm.   Squamous cell carcinoma of skin 09/09/2019   Right cheek lat. to mid nasolabial area. Tx: EDC   Squamous cell carcinoma of skin 12/09/2019   right pretibial below knee   Squamous cell carcinoma of skin 06/25/2022   Left ant forearm - EDC    Family History: Family History  Problem Relation Age of Onset   Heart disease Mother    Stroke Father    Heart disease Brother    Kidney  cancer Neg Hx    Prostate cancer Neg Hx    Bladder Cancer Neg Hx    Breast cancer Neg Hx     Social History   Socioeconomic History   Marital status: Widowed    Spouse name: Not on file   Number of children: Not on file   Years of education: Not on file   Highest education level: Not on file  Occupational History   Not on file  Tobacco Use   Smoking status: Never   Smokeless tobacco: Never  Vaping Use   Vaping status: Never Used  Substance and Sexual Activity   Alcohol use: No    Alcohol/week: 0.0 standard drinks of alcohol   Drug use: No   Sexual activity: Not on file  Other Topics Concern   Not on file  Social History Narrative   Not on file   Social Drivers of Health   Financial Resource Strain: Low Risk  (03/26/2023)   Overall Financial Resource Strain (CARDIA)    Difficulty of Paying Living Expenses: Not hard at all  Food Insecurity: No Food Insecurity (03/26/2023)   Hunger Vital Sign    Worried About Running Out of Food in the Last Year: Never true    Ran Out of Food in the Last Year: Never true  Transportation Needs: No Transportation Needs (03/26/2023)   PRAPARE - Administrator, Civil Service (Medical): No    Lack of Transportation (Non-Medical): No  Physical Activity: Insufficiently Active (03/26/2023)   Exercise Vital Sign    Days of Exercise per Week: 2 days    Minutes of Exercise per Session: 60 min  Stress: Stress Concern Present (03/26/2023)   [employer] of Occupational Health - Occupational Stress Questionnaire    Feeling of Stress : To some extent  Social Connections: Moderately Isolated (03/26/2023)   Social Connection and Isolation Panel [NHANES]    Frequency of Communication with Friends and Family: More than three times a week    Frequency of Social Gatherings with Friends and Family: Once a week    Attends Religious Services: More than 4 times per year    Active Member of [employer] or Organizations: No    Attends Tax inspector Meetings: Never    Marital Status: Widowed  Intimate Partner Violence: Not At Risk (03/26/2023)   Humiliation, Afraid, Rape, and Kick questionnaire    Fear of Current or Ex-Partner: No    Emotionally Abused: No    Physically Abused: No    Sexually Abused: No      Review of Systems  Constitutional:  Negative for chills, fatigue and fever.  Respiratory:  Positive for shortness of breath (mild and intermittent). Negative for cough, chest tightness and wheezing.   Cardiovascular: Negative.  Negative for chest pain and palpitations.  Genitourinary: Negative.   Musculoskeletal:  Positive for arthralgias.  Neurological:  Positive for syncope (prior problem, not resolved).    Vital Signs: BP 122/74   Pulse 71   Temp 98.4 F (36.9 C)   Resp 16   Ht 5' (1.524 m)   Wt 127 lb 9.6 oz (57.9 kg)   SpO2 95%   BMI 24.92 kg/m    Physical Exam Vitals reviewed.  Constitutional:      Appearance: Normal appearance.  HENT:     Head: Normocephalic and atraumatic.  Eyes:     Pupils: Pupils are equal, round, and reactive to light.  Cardiovascular:     Rate and Rhythm: Normal rate.     Heart sounds: Normal heart sounds. No murmur heard. Pulmonary:     Effort: Pulmonary effort is normal. No respiratory distress.     Breath sounds: Normal breath sounds. No wheezing.  Neurological:     Mental Status: She is alert and oriented to person, place, and time.  Psychiatric:        Mood and Affect: Mood normal.        Behavior: Behavior normal.        Assessment/Plan: 1. Seasonal allergic rhinitis due to pollen (Primary) Try xyzal as prescribed. Follow up as needed. Stop allegra - levocetirizine (XYZAL) 5 MG tablet; Take 1 tablet (5 mg total) by mouth every evening.  Dispense: 90 tablet; Refill: 1  2. Acquired hypothyroidism Continue levothyroxine as prescribed.   3. Persistent cough for 3 weeks or longer May take mucinex as needed OTC  4. Abnormal kidney function Labs  are normal at this time, oncology will continue to follow labs due to patient taking pomalyst which can cause damage to the kidneys.    General Counseling: Nazifa verbalizes understanding of the findings of todays visit and agrees with plan of treatment. I have discussed any further diagnostic evaluation that may be needed or ordered today. We also reviewed her medications today. she has been encouraged to call the office with any questions or concerns that should arise related to todays visit.    No orders of the defined types were placed in this encounter.   Meds ordered this encounter  Medications   levocetirizine (XYZAL) 5 MG tablet    Sig: Take 1 tablet (5 mg total) by mouth every evening.    Dispense:  90 tablet    Refill:  1    Fill new script today    Return for previously scheduled, AWV, Syanne Looney PCP in 6 months .   Total time spent:30 Minutes Time spent includes review of chart, medications, test results, and follow up plan with the patient.   [place] Controlled Substance Database was reviewed by me.  This patient was seen by Laurence Pons, FNP-C in collaboration with Dr. Verneta Gone as a part of collaborative care agreement.   Menelik Mcfarren R. Bobbi Burow, MSN, FNP-C Internal medicine

## 2023-09-26 ENCOUNTER — Encounter: Payer: Self-pay | Admitting: Nurse Practitioner

## 2023-09-26 ENCOUNTER — Inpatient Hospital Stay: Payer: Medicare PPO | Admitting: Pharmacist

## 2023-09-26 ENCOUNTER — Inpatient Hospital Stay: Payer: Medicare PPO

## 2023-09-26 ENCOUNTER — Encounter: Payer: Self-pay | Admitting: Oncology

## 2023-09-26 ENCOUNTER — Inpatient Hospital Stay (HOSPITAL_BASED_OUTPATIENT_CLINIC_OR_DEPARTMENT_OTHER): Payer: Medicare PPO | Admitting: Oncology

## 2023-09-26 VITALS — BP 109/70 | HR 50 | Temp 98.0°F | Resp 16 | Ht 60.0 in | Wt 125.7 lb

## 2023-09-26 VITALS — BP 122/72 | HR 57 | Resp 16

## 2023-09-26 DIAGNOSIS — I10 Essential (primary) hypertension: Secondary | ICD-10-CM | POA: Diagnosis not present

## 2023-09-26 DIAGNOSIS — R197 Diarrhea, unspecified: Secondary | ICD-10-CM | POA: Diagnosis not present

## 2023-09-26 DIAGNOSIS — Z85828 Personal history of other malignant neoplasm of skin: Secondary | ICD-10-CM | POA: Diagnosis not present

## 2023-09-26 DIAGNOSIS — C9002 Multiple myeloma in relapse: Secondary | ICD-10-CM | POA: Diagnosis not present

## 2023-09-26 DIAGNOSIS — C9000 Multiple myeloma not having achieved remission: Secondary | ICD-10-CM

## 2023-09-26 DIAGNOSIS — Z85820 Personal history of malignant melanoma of skin: Secondary | ICD-10-CM | POA: Diagnosis not present

## 2023-09-26 DIAGNOSIS — G629 Polyneuropathy, unspecified: Secondary | ICD-10-CM | POA: Diagnosis not present

## 2023-09-26 DIAGNOSIS — Z87440 Personal history of urinary (tract) infections: Secondary | ICD-10-CM | POA: Diagnosis not present

## 2023-09-26 DIAGNOSIS — Z79899 Other long term (current) drug therapy: Secondary | ICD-10-CM | POA: Diagnosis not present

## 2023-09-26 MED ORDER — ZOLEDRONIC ACID 4 MG/5ML IV CONC
3.3000 mg | Freq: Once | INTRAVENOUS | Status: AC
  Administered 2023-09-26: 3.3 mg via INTRAVENOUS
  Filled 2023-09-26: qty 4.13

## 2023-09-26 MED ORDER — SODIUM CHLORIDE 0.9 % IV SOLN
INTRAVENOUS | Status: DC
  Filled 2023-09-26: qty 250

## 2023-09-26 NOTE — Progress Notes (Signed)
 Pt receiving zometa today, denies bone pain or jaw pain and has no upcoming dental procedures.

## 2023-09-26 NOTE — Patient Instructions (Signed)

## 2023-09-26 NOTE — Progress Notes (Signed)
 [facility]  [phone] [phone]  ID: Rhonda [name] [name] OB: 09-06-1933  MR#: [id number]  AOZ#:[id number]  Rhonda [name] Care Team: Sallyanne Kuster, NP as PCP - General (Nurse Practitioner) Debbe Odea, MD as PCP - Cardiology (Cardiology) Jeralyn Ruths, MD as Consulting Physician (Oncology)   CHIEF COMPLAINT:  Multiple myeloma in relapse.  INTERVAL HISTORY: Rhonda [name] returns to clinic today for repeat laboratory, further evaluation, and continuation of Pomalyst and Zometa.  She currently feels well and is asymptomatic.  Her diarrhea has significantly improved. She otherwise feels well.  She has a chronic peripheral neuropathy, but no other neurologic complaints.  She denies any fevers. She has a good appetite and denies weight loss.  She denies any chest pain, shortness of breath, cough, or hemoptysis.  She denies any nausea, vomiting, or constipation. She has no melena or hematochezia.  She has no urinary complaints.  Rhonda [name] offers no further specific complaints today.  REVIEW OF SYSTEMS:   Review of Systems  Constitutional: Negative.  Negative for fever, malaise/fatigue and weight loss.  HENT:  Negative for congestion and sore throat.   Respiratory: Negative.  Negative for cough and shortness of breath.   Cardiovascular: Negative.  Negative for chest pain, palpitations and leg swelling.  Gastrointestinal: Negative.  Negative for abdominal pain, blood in stool, constipation, diarrhea, melena, nausea and vomiting.  Genitourinary: Negative.  Negative for dysuria.  Musculoskeletal: Negative.  Negative for back pain.  Skin: Negative.  Negative for rash.  Neurological:  Positive for tingling and sensory change. Negative for focal weakness and weakness.  Psychiatric/Behavioral: Negative.  [name] [name] is not nervous/anxious and does not have insomnia.     As per HPI. Otherwise, a complete review of systems is negative.  PAST MEDICAL  HISTORY: Past Medical History:  Diagnosis Date   Actinic keratosis    Arthritis    hands   Benign neoplasm of ascending colon    Cataract    GERD (gastroesophageal reflux disease)    Hyperlipemia    Hypertension 01/31/2015   Hypothyroidism    Melanoma (HCC) 04/28/2018   R mid to distal ant lat thigh. MM, SS, tumor thickness 0.4mm, antatomic level II. Excised: 05/13/18, margins free   Multiple myeloma (HCC) 04/18/2016   Multiple myeloma in remission (HCC) 04/18/2016   Osteoporosis    SCC (squamous cell carcinoma) 06/17/2023   R volar forearm, ED[facility]   Shingles    Skin cancer    basal cell   Squamous cell carcinoma of skin 02/04/2014   Left pretibial. KA-like pattern   Squamous cell carcinoma of skin 09/15/2015   Right lat. superior ankle are. Superficial infiltration. Tx: EDC   Squamous cell carcinoma of skin 10/15/2016   Left medial mid lower leg. Tx: EDC   Squamous cell carcinoma of skin 11/27/2018   Right dorsum proximal forearm.   Squamous cell carcinoma of skin 09/09/2019   Right cheek lat. to mid nasolabial area. Tx: EDC   Squamous cell carcinoma of skin 12/09/2019   right pretibial below knee   Squamous cell carcinoma of skin 06/25/2022   Left ant forearm - ED[facility]    PAST SURGICAL HISTORY: Past Surgical History:  Procedure Laterality Date   BREAST EXCISIONAL BIOPSY Right 15+ yrs ago   EXCISIONAL - NEG   CATARACT EXTRACTION W/ INTRAOCULAR LENS IMPLANT     CHOLECYSTECTOMY N/A 03/14/2017   Procedure: LAPAROSCOPIC CHOLECYSTECTOMY;  Surgeon: Lattie Haw, MD;  Location: [facility];  Service: General;  Laterality: N/A;  COLONOSCOPY WITH PROPOFOL N/A 02/21/2015   Procedure: COLONOSCOPY WITH PROPOFOL;  Surgeon: Midge Minium, MD;  Location: [facility];  Service: Endoscopy;  Laterality: N/A;   LAPAROSCOPIC HYSTERECTOMY  1980   POLYPECTOMY  02/21/2015   Procedure: POLYPECTOMY;  Surgeon: Midge Minium, MD;  Location: [facility];  Service: Endoscopy;;    FAMILY  HISTORY: Family History  Problem Relation Age of Onset   Heart disease Mother    Stroke Father    Heart disease Brother    Kidney cancer Neg Hx    Prostate cancer Neg Hx    Bladder Cancer Neg Hx    Breast cancer Neg Hx     ADVANCED DIRECTIVES (Y/N):  N  HEALTH MAINTENANCE: Social History   Tobacco Use   Smoking status: Never   Smokeless tobacco: Never  Vaping Use   Vaping status: Never Used  Substance Use Topics   Alcohol use: No    Alcohol/week: 0.0 standard drinks of alcohol   Drug use: No     Colonoscopy:  PAP:  Bone density:  Lipid panel:  Allergies  Allergen Reactions   Revlimid [Lenalidomide]     Current Outpatient Medications  Medication Sig Dispense Refill   aspirin EC 81 MG tablet Take 81 mg by mouth daily.     Cyanocobalamin (B-12 PO) Take 500 mcg by mouth daily.      EPINEPHrine 0.3 mg/0.3 mL IJ SOAJ injection Inject 0.3 mg into the muscle as needed for anaphylaxis.     fexofenadine (ALLEGRA) 180 MG tablet Take 180 mg by mouth daily.     levocetirizine (XYZAL) 5 MG tablet Take 1 tablet (5 mg total) by mouth every evening. 90 tablet 1   levothyroxine (SYNTHROID) 50 MCG tablet TAKE 1 TABLET BY MOUTH DAILY BEFORE BREAKFAST 90 tablet 3   Multiple Vitamins-Minerals (CENTRUM SILVER PO) Take 1 tablet by mouth daily.     POMALYST 2 MG capsule TAKE 1 CAPSULE BY MOUTH EVERY DAY FOR 21 DAYS ON, 7 DAYS OFF 21 capsule 0   triamcinolone (NASACORT) 55 MCG/ACT AERO nasal inhaler Place 2 sprays into the nose 2 (two) times daily as needed.     No current facility-administered medications for this visit.    OBJECTIVE: Vitals:   09/26/23 0950  BP: 109/70  Pulse: (!) 50  Resp: 16  Temp: 98 F (36.7 C)  SpO2: 99%     Body mass index is 24.55 kg/m.    ECOG FS:0 - Asymptomatic  General: Well-developed, well-nourished, no acute distress. Eyes: Pink conjunctiva, anicteric sclera. HEENT: Normocephalic, moist mucous membranes. Lungs: No audible wheezing or  coughing. Heart: Regular rate and rhythm. Abdomen: Soft, nontender, no obvious distention. Musculoskeletal: No edema, cyanosis, or clubbing. Neuro: Alert, answering all questions appropriately. Cranial nerves grossly intact. Skin: No rashes or petechiae noted. Psych: Normal affect.  LAB RESULTS:  Lab Results  Component Value Date   NA 138 09/19/2023   K 4.0 09/19/2023   CL 109 09/19/2023   CO2 22 09/19/2023   GLUCOSE 104 (H) 09/19/2023   BUN 21 09/19/2023   CREATININE 0.78 09/19/2023   CALCIUM 8.8 (L) 09/19/2023   PROT 6.3 (L) 09/19/2023   ALBUMIN 3.8 09/19/2023   AST 18 09/19/2023   ALT 17 09/19/2023   ALKPHOS 50 09/19/2023   BILITOT 0.6 09/19/2023   GFRNONAA >60 09/19/2023   GFRAA 40 (L) 03/01/2020    Lab Results  Component Value Date   WBC 6.3 09/19/2023   NEUTROABS 4.2 09/19/2023  HGB 12.7 09/19/2023   HCT 38.3 09/19/2023   MCV 99.2 09/19/2023   PLT 202 09/19/2023   Lab Results  Component Value Date   TOTALPROTELP 5.8 (L) 09/19/2023   ALBUMINELP 3.5 05/16/2021   A1GS 0.2 05/16/2021   A2GS 0.6 05/16/2021   BETS 0.8 05/16/2021   GAMS 0.5 05/16/2021   MSPIKE Not Observed 05/16/2021   SPEI Comment 05/16/2021     STUDIES: ECHOCARDIOGRAM COMPLETE Result Date: 09/09/2023    ECHOCARDIOGRAM REPORT   Rhonda [name] Name:   NALEA [name] Date of Exam: 09/09/2023 Medical Rec #:  [id number]        Height:       60.0 in Accession #:    [id number]       Weight:       125.6 lb Date of Birth:  08/09/1933        BSA:          1.532 m Rhonda [name] Age:    89 years         BP:           118/68 mmHg Rhonda [name] Gender: F                HR:           75 bpm. Exam Location:  [place] Procedure: 2D Echo, 3D Echo, Cardiac Doppler, Color Doppler and Strain Analysis            (Both Spectral and Color Flow Doppler were utilized during            procedure). Indications:    R06.9 DOE; I49.3 PVC  History:        Rhonda [name] has no prior history of Echocardiogram examinations.                 Abnormal  ECG, Arrythmias:PVC,                 Signs/Symptoms:Dizziness/Lightheadedness, Dyspnea, Shortness of                 Breath and Syncope; Risk Factors:Hypertension.  Sonographer:    Venson Ginger MHA, BS, RDCS Referring Phys: [id number] [facility] [place] IMPRESSIONS  1. Left ventricular ejection fraction, by estimation, is 60 to 65%. Left ventricular ejection fraction by 3D volume is 57 %. The left ventricle has normal function. The left ventricle has no regional wall motion abnormalities. There is mild left ventricular hypertrophy. Left ventricular diastolic parameters are consistent with Grade II diastolic dysfunction (pseudonormalization).  2. Right ventricular systolic function is normal. The right ventricular size is normal.  3. Left atrial size was mild to moderately dilated.  4. The mitral valve is normal in structure. Mild to moderate mitral valve regurgitation.  5. The aortic valve is tricuspid. Aortic valve regurgitation is not visualized. Aortic valve sclerosis/calcification is present, without any evidence of aortic stenosis.  6. The inferior vena cava is normal in size with greater than 50% respiratory variability, suggesting right atrial pressure of 3 mmHg. FINDINGS  Left Ventricle: Left ventricular ejection fraction, by estimation, is 60 to 65%. Left ventricular ejection fraction by 3D volume is 57 %. The left ventricle has normal function. The left ventricle has no regional wall motion abnormalities. The global longitudinal strain is normal despite suboptimal segment tracking. The left ventricular internal cavity size was normal in size. There is mild left ventricular hypertrophy. Left ventricular diastolic parameters are consistent with Grade II diastolic dysfunction (pseudonormalization). Right Ventricle: The right ventricular size is normal. No increase in  right ventricular wall thickness. Right ventricular systolic function is normal. Left Atrium: Left atrial size was mild to moderately dilated.  Right Atrium: Right atrial size was normal in size. Pericardium: There is no evidence of pericardial effusion. Mitral Valve: The mitral valve is normal in structure. Mild to moderate mitral valve regurgitation. Tricuspid Valve: The tricuspid valve is normal in structure. Tricuspid valve regurgitation is mild. Aortic Valve: The aortic valve is tricuspid. Aortic valve regurgitation is not visualized. Aortic valve sclerosis/calcification is present, without any evidence of aortic stenosis. Aortic valve mean gradient measures 5.0 mmHg. Aortic valve peak gradient measures 9.7 mmHg. Aortic valve area, by VTI measures 1.92 cm. Pulmonic Valve: The pulmonic valve was normal in structure. Pulmonic valve regurgitation is mild. Aorta: The aortic root and ascending aorta are structurally normal, with no evidence of dilitation. Venous: The inferior vena cava is normal in size with greater than 50% respiratory variability, suggesting right atrial pressure of 3 mmHg. IAS/Shunts: No atrial level shunt detected by color flow Doppler. Additional Comments: 3D was performed not requiring image post processing on an independent workstation and was normal.  LEFT VENTRICLE PLAX 2D LVIDd:         4.40 cm         Diastology LVIDs:         2.70 cm         LV e' medial:    6.09 cm/s LV PW:         1.10 cm         LV E/e' medial:  16.9 LV IVS:        1.10 cm         LV e' lateral:   8.27 cm/s LVOT diam:     1.90 cm         LV E/e' lateral: 12.5 LV SV:         57 LV SV Index:   37              2D Longitudinal LVOT Area:     2.84 cm        Strain                                2D Strain GLS   -18.6 %                                Avg:                                 3D Volume EF                                LV 3D EF:    Left                                             ventricul                                             ar  ejection                                             fraction                                              by 3D                                             volume is                                             57 %.                                 3D Volume EF:                                3D EF:        57 %                                LV EDV:       103 ml                                LV ESV:       44 ml                                LV SV:        59 ml RIGHT VENTRICLE RV Basal diam:  3.50 cm RV Mid diam:    3.25 cm RV S prime:     15.80 cm/s TAPSE (M-mode): 2.5 cm LEFT ATRIUM             Index        RIGHT ATRIUM           Index LA diam:        3.90 cm 2.55 cm/m   RA Area:     14.30 cm LA Vol (A2C):   68.0 ml 44.39 ml/m  RA Volume:   33.90 ml  22.13 ml/m LA Vol (A4C):   65.4 ml 42.70 ml/m LA Biplane Vol: 66.5 ml 43.41 ml/m  AORTIC VALVE AV Area (Vmax):    1.87 cm AV Area (Vmean):   1.88 cm AV Area (VTI):     1.92 cm AV Vmax:           156.00 cm/s AV Vmean:          105.000 cm/s AV VTI:            0.297 m AV Peak Grad:      9.7 mmHg AV Mean Grad:      5.0 mmHg LVOT Vmax:  103.00 cm/s LVOT Vmean:        69.700 cm/s LVOT VTI:          0.201 m LVOT/AV VTI ratio: 0.68  AORTA Ao Root diam: 2.50 cm Ao Asc diam:  3.10 cm MITRAL VALVE MV Area (PHT): 3.53 cm     SHUNTS MV Decel Time: 215 msec     Systemic VTI:  0.20 m MV E velocity: 103.00 cm/s  Systemic Diam: 1.90 cm MV A velocity: 96.90 cm/s MV E/A ratio:  1.06 Constancia Delton MD Electronically signed by Constancia Delton MD Signature Date/Time: 09/09/2023/3:18:48 PM    Final     ASSESSMENT:  Multiple myeloma in relapse.  PLAN:    Multiple myeloma in relapse: Bone marrow biopsy on April 09, 2016 revealed 44% plasma cells and normal cytogenetics. Given the results of her metastatic bone survey on March 12, 2016, her elevated lambda free chains, and hypercalcemia, Rhonda [name] fit the criteria for multiple myeloma and was initially treated with single agent Revlimid.  She could not tolerate Revlimid and was switched to  pomalidomide 2 mg daily for 21 days with a 7-day break.  Metastatic bone survey on February 12, 2019 revealed new small lesions in her calvarium.  Rhonda [name]'s most recent M spike is now 0.0 and her immunoglobulins remain decreased.  Lambda free light chains have been trending down since March 2021 when Pomalyst was initiated from 5600 to 345.6.  Suspect her recent trend up and lambda free light chains may have been secondary to poor absorption from ongoing diarrhea.  They are trending back down and are 407.9.  Continue with treatment as prescribed.  Proceed with Zometa today.  Return to clinic in 3 months with repeat laboratory, further evaluation, and continuation of treatment.  Appreciate clinical pharmacy input.   Peripheral neuropathy: Chronic and changed.  Rhonda [name] was previously given a referral to acupuncture which she said did not help much.  Hypocalcemia: Mild.  Proceed with Zometa as scheduled. Diarrhea: Improved.  Continue follow-up with GI as indicated.  I spent a total of 30 minutes reviewing chart data, face-to-face evaluation with [name] [name], counseling and coordination of care as detailed above.   Rhonda [name] expressed understanding and was in agreement with this plan. She also understands that She can call clinic at any time with any questions, concerns, or complaints.    Shellie Dials, MD 09/26/23 1:09 PM

## 2023-09-26 NOTE — Progress Notes (Signed)
 Oral Chemotherapy Clinic [facility]  [phone] [phone]  Patient Care Team: Sallyanne Kuster, NP as PCP - General (Nurse Practitioner) Debbe Odea, MD as PCP - Cardiology (Cardiology) Jeralyn Ruths, MD as Consulting Physician (Oncology)   Name of the patient: Rhonda [name]  [id number]  1934/01/23   Date of visit: 09/26/23  HPI: Patient is a 89 y.o. female with multiple myeloma. Currently on treatment with pomalidomide.   Reason for Consult: Oral chemotherapy follow-up for pomalidomide therapy.   PAST MEDICAL HISTORY: Past Medical History:  Diagnosis Date   Actinic keratosis    Arthritis    hands   Benign neoplasm of ascending colon    Cataract    GERD (gastroesophageal reflux disease)    Hyperlipemia    Hypertension 01/31/2015   Hypothyroidism    Melanoma (HCC) 04/28/2018   R mid to distal ant lat thigh. MM, SS, tumor thickness 0.79mm, antatomic level II. Excised: 05/13/18, margins free   Multiple myeloma (HCC) 04/18/2016   Multiple myeloma in remission (HCC) 04/18/2016   Osteoporosis    SCC (squamous cell carcinoma) 06/17/2023   R volar forearm, EDC   Shingles    Skin cancer    basal cell   Squamous cell carcinoma of skin 02/04/2014   Left pretibial. KA-like pattern   Squamous cell carcinoma of skin 09/15/2015   Right lat. superior ankle are. Superficial infiltration. Tx: EDC   Squamous cell carcinoma of skin 10/15/2016   Left medial mid lower leg. Tx: EDC   Squamous cell carcinoma of skin 11/27/2018   Right dorsum proximal forearm.   Squamous cell carcinoma of skin 09/09/2019   Right cheek lat. to mid nasolabial area. Tx: EDC   Squamous cell carcinoma of skin 12/09/2019   right pretibial below knee   Squamous cell carcinoma of skin 06/25/2022   Left ant forearm - EDC    HEMATOLOGY/ONCOLOGY HISTORY:  Oncology History  Multiple myeloma (HCC)  04/18/2016 Initial Diagnosis   Multiple myeloma (HCC)      ALLERGIES:  is allergic to revlimid [lenalidomide].  MEDICATIONS:  Current Outpatient Medications  Medication Sig Dispense Refill   aspirin EC 81 MG tablet Take 81 mg by mouth daily.     Cyanocobalamin (B-12 PO) Take 500 mcg by mouth daily.      EPINEPHrine 0.3 mg/0.3 mL IJ SOAJ injection Inject 0.3 mg into the muscle as needed for anaphylaxis.     fexofenadine (ALLEGRA) 180 MG tablet Take 180 mg by mouth daily.     levocetirizine (XYZAL) 5 MG tablet Take 1 tablet (5 mg total) by mouth every evening. 90 tablet 1   levothyroxine (SYNTHROID) 50 MCG tablet TAKE 1 TABLET BY MOUTH DAILY BEFORE BREAKFAST 90 tablet 3   Multiple Vitamins-Minerals (CENTRUM SILVER PO) Take 1 tablet by mouth daily.     POMALYST 2 MG capsule TAKE 1 CAPSULE BY MOUTH EVERY DAY FOR 21 DAYS ON, 7 DAYS OFF 21 capsule 0   triamcinolone (NASACORT) 55 MCG/ACT AERO nasal inhaler Place 2 sprays into the nose 2 (two) times daily as needed.     No current facility-administered medications for this visit.    VITAL SIGNS: There were no vitals taken for this visit. There were no vitals filed for this visit.  Estimated body mass index is 24.55 kg/m as calculated from the following:   Height as of an earlier encounter on 09/26/23: 5' (1.524 m).   Weight as of an earlier encounter on 09/26/23: 57 kg (125 lb  11.2 oz).  LABS: CBC:    Component Value Date/Time   WBC 6.3 09/19/2023 1332   HGB 12.7 09/19/2023 1332   HCT 38.3 09/19/2023 1332   PLT 202 09/19/2023 1332   MCV 99.2 09/19/2023 1332   NEUTROABS 4.2 09/19/2023 1332   LYMPHSABS 1.2 09/19/2023 1332   MONOABS 0.7 09/19/2023 1332   EOSABS 0.0 09/19/2023 1332   BASOSABS 0.1 09/19/2023 1332   Comprehensive Metabolic Panel:    Component Value Date/Time   NA 138 09/19/2023 1332   NA 138 07/24/2022 1044   K 4.0 09/19/2023 1332   CL 109 09/19/2023 1332   CO2 22 09/19/2023 1332   BUN 21 09/19/2023 1332   BUN 19 07/24/2022 1044   CREATININE 0.78 09/19/2023 1332    GLUCOSE 104 (H) 09/19/2023 1332   CALCIUM 8.8 (L) 09/19/2023 1332   AST 18 09/19/2023 1332   ALT 17 09/19/2023 1332   ALKPHOS 50 09/19/2023 1332   BILITOT 0.6 09/19/2023 1332   PROT 6.3 (L) 09/19/2023 1332   PROT 6.6 07/24/2022 1044   ALBUMIN 3.8 09/19/2023 1332   ALBUMIN 4.5 07/24/2022 1044     Present during today's visit: patient only  Assessment and Plan: Reviewed CBC/CMP/light chains/Ig with patient, continue pomalidomide 2mg  21on/7off Patient is starting her off week today 09/26/23 Okay to receive Zometa today   Oral Chemotherapy Side Effect/Intolerance:  No reported side effects  Other: Patient started back at [employer] last week and is enjoying it Patient reports occasional anxiety, suggested breathing exercises to help manage the anxiety   Oral Chemotherapy Adherence: no missed doses reported No patient barriers to medication adherence identified.   New medications: none reported  Medication Access Issues: No issues, patient fills at [facility]  Patient expressed understanding and was in agreement with this plan. She also understands that She can call clinic at any time with any questions, concerns, or complaints.   Follow-up plan: RTC 3 months: see MD/pharm/Zometa, labs 1 week prior  Thank you for allowing me to participate in the care of this very pleasant patient.   Time Total: 15 mins  Visit consisted of counseling and education on dealing with issues of symptom management in the setting of serious and potentially life-threatening illness.Greater than 50%  of this time was spent counseling and coordinating care related to the above assessment and plan.  Signed by: Jylian Pappalardo N. Mammie Meras, PharmD, BCPS, Lorraine Roses, CPP Hematology/Oncology Clinical Pharmacist Practitioner [place]/DB/AP Oral Chemotherapy [facility] [phone]  09/26/2023 9:58 AM

## 2023-10-14 ENCOUNTER — Other Ambulatory Visit: Payer: Self-pay | Admitting: *Deleted

## 2023-10-14 DIAGNOSIS — C9000 Multiple myeloma not having achieved remission: Secondary | ICD-10-CM

## 2023-10-14 MED ORDER — POMALIDOMIDE 2 MG PO CAPS: 2.0000 mg | ORAL_CAPSULE | Freq: Every day | ORAL | 0 refills | Status: DC

## 2023-10-16 ENCOUNTER — Other Ambulatory Visit: Payer: Self-pay | Admitting: Oncology

## 2023-10-16 DIAGNOSIS — C9000 Multiple myeloma not having achieved remission: Secondary | ICD-10-CM

## 2023-11-12 ENCOUNTER — Ambulatory Visit: Admitting: Nurse Practitioner

## 2023-11-12 ENCOUNTER — Other Ambulatory Visit: Payer: Self-pay | Admitting: Oncology

## 2023-11-12 ENCOUNTER — Encounter: Payer: Self-pay | Admitting: Nurse Practitioner

## 2023-11-12 VITALS — BP 135/70 | HR 72 | Temp 98.4°F | Resp 16 | Ht 60.0 in | Wt 124.6 lb

## 2023-11-12 DIAGNOSIS — J0100 Acute maxillary sinusitis, unspecified: Secondary | ICD-10-CM | POA: Diagnosis not present

## 2023-11-12 DIAGNOSIS — C9000 Multiple myeloma not having achieved remission: Secondary | ICD-10-CM

## 2023-11-12 MED ORDER — AMOXICILLIN-POT CLAVULANATE 875-125 MG PO TABS: 1.0000 | ORAL_TABLET | Freq: Two times a day (BID) | ORAL | 0 refills | Status: DC

## 2023-11-12 MED ORDER — HYDROCOD POLI-CHLORPHE POLI ER 10-8 MG/5ML PO SUER: 5.0000 mL | Freq: Two times a day (BID) | ORAL | 0 refills | Status: DC | PRN

## 2023-11-12 NOTE — Progress Notes (Signed)
 [facility] [address] [place], [place] [address]  Internal MEDICINE  Office Visit Note  Patient Name: Rhonda [name]  [id number]  [id number]  Date of Service: 12/08/2023  Chief Complaint  Patient presents with   Acute Visit     HPI Rhonda [name] presents for an acute sick visit for symptoms of sinusitis.  --onset of symptoms about about a week ago  --negative for covid and flu --reports nasal congestion, cough, sinus pressure/sinus pain, runny nose, post nasal drip, fatigue, chills, fever, yellow drainage.  --denies any SOB, wheezing, headache or body aches.       Current Medication:  Outpatient Encounter Medications as of 11/12/2023  Medication Sig   amoxicillin -clavulanate (AUGMENTIN ) 875-125 MG tablet Take 1 tablet by mouth 2 (two) times daily. Take with food   aspirin EC 81 MG tablet Take 81 mg by mouth daily.   chlorpheniramine-HYDROcodone  (TUSSIONEX) 10-8 MG/5ML Take 5 mLs by mouth every 12 (twelve) hours as needed for cough.   Cyanocobalamin (B-12 PO) Take 500 mcg by mouth daily.    EPINEPHrine  0.3 mg/0.3 mL IJ SOAJ injection Inject 0.3 mg into the muscle as needed for anaphylaxis.   fexofenadine (ALLEGRA) 180 MG tablet Take 180 mg by mouth daily.   levocetirizine (XYZAL ) 5 MG tablet Take 1 tablet (5 mg total) by mouth every evening.   levothyroxine  (SYNTHROID ) 50 MCG tablet TAKE 1 TABLET BY MOUTH DAILY BEFORE BREAKFAST   Multiple Vitamins-Minerals (CENTRUM SILVER PO) Take 1 tablet by mouth daily.   triamcinolone  (NASACORT ) 55 MCG/ACT AERO nasal inhaler Place 2 sprays into the nose 2 (two) times daily as needed.   [DISCONTINUED] POMALYST  2 MG capsule TAKE 1 CAPSULE BY MOUTH EVERY DAY FOR 21 DAYS ON, 7 DAYS OFF   No facility-administered encounter medications on file as of 11/12/2023.      Medical History: Past Medical History:  Diagnosis Date   Actinic keratosis    Arthritis    hands   Benign neoplasm of ascending colon    Cataract    GERD  (gastroesophageal reflux disease)    Hyperlipemia    Hypertension 01/31/2015   Hypothyroidism    Melanoma (HCC) 04/28/2018   R mid to distal ant lat thigh. MM, SS, tumor thickness 0.31mm, antatomic level II. Excised: 05/13/18, margins free   Multiple myeloma (HCC) 04/18/2016   Multiple myeloma in remission (HCC) 04/18/2016   Osteoporosis    SCC (squamous cell carcinoma) 06/17/2023   R volar forearm, EDC   Shingles    Skin cancer    basal cell   Squamous cell carcinoma of skin 02/04/2014   Left pretibial. KA-like pattern   Squamous cell carcinoma of skin 09/15/2015   Right lat. superior ankle are. Superficial infiltration. Tx: EDC   Squamous cell carcinoma of skin 10/15/2016   Left medial mid lower leg. Tx: EDC   Squamous cell carcinoma of skin 11/27/2018   Right dorsum proximal forearm.   Squamous cell carcinoma of skin 09/09/2019   Right cheek lat. to mid nasolabial area. Tx: EDC   Squamous cell carcinoma of skin 12/09/2019   right pretibial below knee   Squamous cell carcinoma of skin 06/25/2022   Left ant forearm - EDC     Vital Signs: BP 135/70   Pulse 72   Temp 98.4 F (36.9 C)   Resp 16   Ht 5' (1.524 m)   Wt 124 lb 9.6 oz (56.5 kg)   SpO2 96%   BMI 24.33 kg/m  Review of Systems  Constitutional:  Positive for appetite change and fatigue. Negative for chills and fever.  HENT:  Positive for congestion, postnasal drip, rhinorrhea, sinus pressure, sinus pain and voice change. Negative for ear pain, sore throat and trouble swallowing.   Eyes:  Positive for discharge.  Respiratory:  Positive for cough. Negative for chest tightness, shortness of breath and wheezing.   Cardiovascular: Negative.  Negative for chest pain and palpitations.  Gastrointestinal:  Negative for diarrhea, nausea and vomiting.  Musculoskeletal:  Positive for myalgias (body aches).  Neurological:  Positive for headaches.    Physical Exam Vitals reviewed.  Constitutional:      Appearance:  Normal appearance. She is normal weight.  HENT:     Head: Normocephalic and atraumatic.     Comments: Sinus pressure pain with palpation in frontal sinus and bilateral cheeks    Right Ear: Tympanic membrane, ear canal and external ear normal.     Left Ear: Tympanic membrane, ear canal and external ear normal.     Ears:     Comments: Partially impacted cerumen    Nose: Congestion and rhinorrhea present.     Mouth/Throat:     Pharynx: Posterior oropharyngeal erythema present.   Eyes:     Extraocular Movements: Extraocular movements intact.     Pupils: Pupils are equal, round, and reactive to light.     Comments: Conjunctiva erythematous    Cardiovascular:     Rate and Rhythm: Normal rate and regular rhythm.     Pulses: Normal pulses.     Heart sounds: Normal heart sounds.  Pulmonary:     Effort: Pulmonary effort is normal. No respiratory distress.     Breath sounds: Normal breath sounds.   Musculoskeletal:     Cervical back: Normal range of motion and neck supple.  Lymphadenopathy:     Cervical: Cervical adenopathy present.   Skin:    General: Skin is warm and dry.   Neurological:     Mental Status: She is alert and oriented to person, place, and time.   Psychiatric:        Mood and Affect: Mood normal.        Behavior: Behavior normal.       Assessment/Plan: 1. Acute non-recurrent maxillary sinusitis (Primary) Antibiotic and cough medication prescribed  - amoxicillin -clavulanate (AUGMENTIN ) 875-125 MG tablet; Take 1 tablet by mouth 2 (two) times daily. Take with food  Dispense: 20 tablet; Refill: 0 - chlorpheniramine-HYDROcodone  (TUSSIONEX) 10-8 MG/5ML; Take 5 mLs by mouth every 12 (twelve) hours as needed for cough.  Dispense: 140 mL; Refill: 0   General Counseling: Rhonda [name] verbalizes understanding of the findings of todays visit and agrees with plan of treatment. I have discussed any further diagnostic evaluation that may be needed or ordered today. We also  reviewed her medications today. she has been encouraged to call the office with any questions or concerns that should arise related to todays visit.    Counseling:    No orders of the defined types were placed in this encounter.   Meds ordered this encounter  Medications   amoxicillin -clavulanate (AUGMENTIN ) 875-125 MG tablet    Sig: Take 1 tablet by mouth 2 (two) times daily. Take with food    Dispense:  20 tablet    Refill:  0    Fill new script today   chlorpheniramine-HYDROcodone  (TUSSIONEX) 10-8 MG/5ML    Sig: Take 5 mLs by mouth every 12 (twelve) hours as needed for cough.    Dispense:  140 mL    Refill:  0    Fill new script today    Return if symptoms worsen or fail to improve.  [place] Controlled Substance Database was reviewed by me for overdose risk score (ORS)  Time spent:20 Minutes Time spent with patient included reviewing progress notes, labs, imaging studies, and discussing plan for follow up.   This patient was seen by Mardy Maxin, FNP-C in collaboration with Dr. Sigrid Bathe as a part of collaborative care agreement.  Ryne Mctigue R. Maxin, MSN, FNP-C Internal Medicine

## 2023-12-03 ENCOUNTER — Other Ambulatory Visit: Payer: Self-pay | Admitting: Oncology

## 2023-12-03 ENCOUNTER — Other Ambulatory Visit: Payer: Self-pay | Admitting: *Deleted

## 2023-12-03 DIAGNOSIS — C9000 Multiple myeloma not having achieved remission: Secondary | ICD-10-CM

## 2023-12-03 MED ORDER — POMALIDOMIDE 2 MG PO CAPS
2.0000 mg | ORAL_CAPSULE | Freq: Every day | ORAL | 0 refills | Status: DC
Start: 2023-12-03 — End: 2024-01-03

## 2023-12-05 ENCOUNTER — Encounter: Payer: Self-pay | Admitting: Oncology

## 2023-12-08 ENCOUNTER — Encounter: Payer: Self-pay | Admitting: Nurse Practitioner

## 2023-12-19 DIAGNOSIS — H40153 Residual stage of open-angle glaucoma, bilateral: Secondary | ICD-10-CM | POA: Diagnosis not present

## 2023-12-26 ENCOUNTER — Inpatient Hospital Stay: Attending: Oncology

## 2023-12-26 DIAGNOSIS — C9002 Multiple myeloma in relapse: Secondary | ICD-10-CM | POA: Diagnosis not present

## 2023-12-26 DIAGNOSIS — Z87440 Personal history of urinary (tract) infections: Secondary | ICD-10-CM | POA: Insufficient documentation

## 2023-12-26 DIAGNOSIS — Z85820 Personal history of malignant melanoma of skin: Secondary | ICD-10-CM | POA: Insufficient documentation

## 2023-12-26 DIAGNOSIS — C9000 Multiple myeloma not having achieved remission: Secondary | ICD-10-CM

## 2023-12-26 DIAGNOSIS — G629 Polyneuropathy, unspecified: Secondary | ICD-10-CM | POA: Diagnosis not present

## 2023-12-26 DIAGNOSIS — R197 Diarrhea, unspecified: Secondary | ICD-10-CM | POA: Diagnosis not present

## 2023-12-26 DIAGNOSIS — Z85828 Personal history of other malignant neoplasm of skin: Secondary | ICD-10-CM | POA: Insufficient documentation

## 2023-12-26 DIAGNOSIS — I10 Essential (primary) hypertension: Secondary | ICD-10-CM | POA: Insufficient documentation

## 2023-12-26 DIAGNOSIS — Z79899 Other long term (current) drug therapy: Secondary | ICD-10-CM | POA: Insufficient documentation

## 2023-12-26 LAB — CMP (CANCER CENTER ONLY)
ALT: 18 U/L (ref 0–44)
AST: 18 U/L (ref 15–41)
Albumin: 3.9 g/dL (ref 3.5–5.0)
Alkaline Phosphatase: 55 U/L (ref 38–126)
Anion gap: 8 (ref 5–15)
BUN: 16 mg/dL (ref 8–23)
CO2: 24 mmol/L (ref 22–32)
Calcium: 9.1 mg/dL (ref 8.9–10.3)
Chloride: 104 mmol/L (ref 98–111)
Creatinine: 0.97 mg/dL (ref 0.44–1.00)
GFR, Estimated: 56 mL/min — ABNORMAL LOW (ref >60)
Glucose, Bld: 87 mg/dL (ref 70–99)
Potassium: 4.2 mmol/L (ref 3.5–5.1)
Sodium: 136 mmol/L (ref 135–145)
Total Bilirubin: 0.7 mg/dL (ref 0.0–1.2)
Total Protein: 6.3 g/dL — ABNORMAL LOW (ref 6.5–8.1)

## 2023-12-26 LAB — CBC WITH DIFFERENTIAL/PLATELET
Abs Immature Granulocytes: 0.01 K/uL (ref 0.00–0.07)
Basophils Absolute: 0.1 K/uL (ref 0.0–0.1)
Basophils Relative: 3 %
Eosinophils Absolute: 0 K/uL (ref 0.0–0.5)
Eosinophils Relative: 1 %
HCT: 36.9 % (ref 36.0–46.0)
Hemoglobin: 12.1 g/dL (ref 12.0–15.0)
Immature Granulocytes: 0 %
Lymphocytes Relative: 31 %
Lymphs Abs: 1.1 K/uL (ref 0.7–4.0)
MCH: 32.4 pg (ref 26.0–34.0)
MCHC: 32.8 g/dL (ref 30.0–36.0)
MCV: 98.7 fL (ref 80.0–100.0)
Monocytes Absolute: 1.1 K/uL — ABNORMAL HIGH (ref 0.1–1.0)
Monocytes Relative: 31 %
Neutro Abs: 1.1 K/uL — ABNORMAL LOW (ref 1.7–7.7)
Neutrophils Relative %: 34 %
Platelets: 209 K/uL (ref 150–400)
RBC: 3.74 MIL/uL — ABNORMAL LOW (ref 3.87–5.11)
RDW: 14 % (ref 11.5–15.5)
WBC: 3.4 K/uL — ABNORMAL LOW (ref 4.0–10.5)
nRBC: 0 % (ref 0.0–0.2)

## 2023-12-27 LAB — KAPPA/LAMBDA LIGHT CHAINS
Kappa free light chain: 24.7 mg/L — ABNORMAL HIGH (ref 3.3–19.4)
Kappa, lambda light chain ratio: 0.04 — ABNORMAL LOW (ref 0.26–1.65)
Lambda free light chains: 665.9 mg/L — ABNORMAL HIGH (ref 5.7–26.3)

## 2023-12-27 LAB — IGG, IGA, IGM
IgA: 107 mg/dL (ref 64–422)
IgG (Immunoglobin G), Serum: 685 mg/dL (ref 586–1602)
IgM (Immunoglobulin M), Srm: 18 mg/dL — ABNORMAL LOW (ref 26–217)

## 2023-12-30 DIAGNOSIS — I10 Essential (primary) hypertension: Secondary | ICD-10-CM | POA: Diagnosis not present

## 2023-12-30 DIAGNOSIS — Z7689 Persons encountering health services in other specified circumstances: Secondary | ICD-10-CM | POA: Diagnosis not present

## 2023-12-30 DIAGNOSIS — I700 Atherosclerosis of aorta: Secondary | ICD-10-CM | POA: Diagnosis not present

## 2023-12-30 DIAGNOSIS — I110 Hypertensive heart disease with heart failure: Secondary | ICD-10-CM | POA: Diagnosis not present

## 2023-12-30 DIAGNOSIS — K219 Gastro-esophageal reflux disease without esophagitis: Secondary | ICD-10-CM | POA: Diagnosis not present

## 2023-12-30 DIAGNOSIS — C9001 Multiple myeloma in remission: Secondary | ICD-10-CM | POA: Diagnosis not present

## 2023-12-30 DIAGNOSIS — E782 Mixed hyperlipidemia: Secondary | ICD-10-CM | POA: Diagnosis not present

## 2023-12-30 DIAGNOSIS — I5032 Chronic diastolic (congestive) heart failure: Secondary | ICD-10-CM | POA: Diagnosis not present

## 2023-12-30 DIAGNOSIS — E039 Hypothyroidism, unspecified: Secondary | ICD-10-CM | POA: Diagnosis not present

## 2023-12-30 LAB — IMMUNOFIXATION ELECTROPHORESIS
IgA: 109 mg/dL (ref 64–422)
IgG (Immunoglobin G), Serum: 702 mg/dL (ref 586–1602)
IgM (Immunoglobulin M), Srm: 19 mg/dL — ABNORMAL LOW (ref 26–217)
Total Protein ELP: 5.8 g/dL — ABNORMAL LOW (ref 6.0–8.5)

## 2024-01-02 ENCOUNTER — Ambulatory Visit: Admitting: Pharmacist

## 2024-01-02 ENCOUNTER — Ambulatory Visit: Admitting: Oncology

## 2024-01-02 ENCOUNTER — Ambulatory Visit

## 2024-01-03 ENCOUNTER — Other Ambulatory Visit: Payer: Self-pay | Admitting: *Deleted

## 2024-01-03 DIAGNOSIS — C9000 Multiple myeloma not having achieved remission: Secondary | ICD-10-CM

## 2024-01-03 MED ORDER — POMALIDOMIDE 2 MG PO CAPS: 2.0000 mg | ORAL_CAPSULE | Freq: Every day | ORAL | 0 refills | Status: DC

## 2024-01-05 ENCOUNTER — Other Ambulatory Visit: Payer: Self-pay | Admitting: Oncology

## 2024-01-05 DIAGNOSIS — C9000 Multiple myeloma not having achieved remission: Secondary | ICD-10-CM

## 2024-01-07 ENCOUNTER — Encounter: Payer: Self-pay | Admitting: Oncology

## 2024-01-07 ENCOUNTER — Inpatient Hospital Stay

## 2024-01-07 ENCOUNTER — Inpatient Hospital Stay: Admitting: Pharmacist

## 2024-01-07 ENCOUNTER — Inpatient Hospital Stay (HOSPITAL_BASED_OUTPATIENT_CLINIC_OR_DEPARTMENT_OTHER): Admitting: Oncology

## 2024-01-07 VITALS — BP 137/82 | HR 66 | Temp 97.8°F | Resp 18 | Ht 60.0 in | Wt 128.0 lb

## 2024-01-07 VITALS — BP 132/65 | HR 53 | Temp 97.2°F | Resp 19

## 2024-01-07 DIAGNOSIS — C9002 Multiple myeloma in relapse: Secondary | ICD-10-CM | POA: Diagnosis not present

## 2024-01-07 DIAGNOSIS — Z85820 Personal history of malignant melanoma of skin: Secondary | ICD-10-CM | POA: Diagnosis not present

## 2024-01-07 DIAGNOSIS — Z87440 Personal history of urinary (tract) infections: Secondary | ICD-10-CM | POA: Diagnosis not present

## 2024-01-07 DIAGNOSIS — I10 Essential (primary) hypertension: Secondary | ICD-10-CM | POA: Diagnosis not present

## 2024-01-07 DIAGNOSIS — G629 Polyneuropathy, unspecified: Secondary | ICD-10-CM | POA: Diagnosis not present

## 2024-01-07 DIAGNOSIS — C9000 Multiple myeloma not having achieved remission: Secondary | ICD-10-CM

## 2024-01-07 DIAGNOSIS — R197 Diarrhea, unspecified: Secondary | ICD-10-CM | POA: Diagnosis not present

## 2024-01-07 DIAGNOSIS — Z79899 Other long term (current) drug therapy: Secondary | ICD-10-CM | POA: Diagnosis not present

## 2024-01-07 DIAGNOSIS — Z85828 Personal history of other malignant neoplasm of skin: Secondary | ICD-10-CM | POA: Diagnosis not present

## 2024-01-07 MED ORDER — ZOLEDRONIC ACID 4 MG/5ML IV CONC
3.0000 mg | Freq: Once | INTRAVENOUS | Status: AC
  Administered 2024-01-07: 3 mg via INTRAVENOUS
  Filled 2024-01-07: qty 3.75

## 2024-01-07 MED ORDER — SODIUM CHLORIDE 0.9 % IV SOLN
Freq: Once | INTRAVENOUS | Status: AC
  Filled 2024-01-07: qty 250

## 2024-01-07 NOTE — Progress Notes (Unsigned)
 [facility]  [phone] [phone]  ID: Gustav MALVA Rex OB: 1933/08/16  MR#: [id number]  RDW#:[id number]  Patient Care Team: Liana Fish, NP as PCP - General (Nurse Practitioner) Darliss Rogue, MD as PCP - Cardiology (Cardiology) Jacobo Evalene PARAS, MD as Consulting Physician (Oncology)   CHIEF COMPLAINT:  Multiple myeloma in relapse.  INTERVAL HISTORY: Patient returns to clinic today for repeat laboratory, further evaluation, and continuation of Pomalyst  and Zometa .  She currently feels well and is asymptomatic.  Her diarrhea has significantly improved. She otherwise feels well.  She has a chronic peripheral neuropathy, but no other neurologic complaints.  She denies any fevers. She has a good appetite and denies weight loss.  She denies any chest pain, shortness of breath, cough, or hemoptysis.  She denies any nausea, vomiting, or constipation. She has no melena or hematochezia.  She has no urinary complaints.  Patient offers no further specific complaints today.  REVIEW OF SYSTEMS:   Review of Systems  Constitutional: Negative.  Negative for fever, malaise/fatigue and weight loss.  HENT:  Negative for congestion and sore throat.   Respiratory: Negative.  Negative for cough and shortness of breath.   Cardiovascular: Negative.  Negative for chest pain, palpitations and leg swelling.  Gastrointestinal: Negative.  Negative for abdominal pain, blood in stool, constipation, diarrhea, melena, nausea and vomiting.  Genitourinary: Negative.  Negative for dysuria.  Musculoskeletal: Negative.  Negative for back pain.  Skin: Negative.  Negative for rash.  Neurological:  Positive for tingling and sensory change. Negative for focal weakness and weakness.  Psychiatric/Behavioral: Negative.  The patient is not nervous/anxious and does not have insomnia.     As per HPI. Otherwise, a complete review of systems is negative.  PAST MEDICAL  HISTORY: Past Medical History:  Diagnosis Date   Actinic keratosis    Arthritis    hands   Benign neoplasm of ascending colon    Cataract    GERD (gastroesophageal reflux disease)    Hyperlipemia    Hypertension 01/31/2015   Hypothyroidism    Melanoma (HCC) 04/28/2018   R mid to distal ant lat thigh. MM, SS, tumor thickness 0.66mm, antatomic level II. Excised: 05/13/18, margins free   Multiple myeloma (HCC) 04/18/2016   Multiple myeloma in remission (HCC) 04/18/2016   Osteoporosis    SCC (squamous cell carcinoma) 06/17/2023   R volar forearm, EDC   Shingles    Skin cancer    basal cell   Squamous cell carcinoma of skin 02/04/2014   Left pretibial. KA-like pattern   Squamous cell carcinoma of skin 09/15/2015   Right lat. superior ankle are. Superficial infiltration. Tx: EDC   Squamous cell carcinoma of skin 10/15/2016   Left medial mid lower leg. Tx: EDC   Squamous cell carcinoma of skin 11/27/2018   Right dorsum proximal forearm.   Squamous cell carcinoma of skin 09/09/2019   Right cheek lat. to mid nasolabial area. Tx: EDC   Squamous cell carcinoma of skin 12/09/2019   right pretibial below knee   Squamous cell carcinoma of skin 06/25/2022   Left ant forearm - EDC    PAST SURGICAL HISTORY: Past Surgical History:  Procedure Laterality Date   BREAST EXCISIONAL BIOPSY Right 15+ yrs ago   EXCISIONAL - NEG   CATARACT EXTRACTION W/ INTRAOCULAR LENS IMPLANT     CHOLECYSTECTOMY N/A 03/14/2017   Procedure: LAPAROSCOPIC CHOLECYSTECTOMY;  Surgeon: Wonda Charlie BRAVO, MD;  Location: [facility];  Service: General;  Laterality: N/A;  COLONOSCOPY WITH PROPOFOL  N/A 02/21/2015   Procedure: COLONOSCOPY WITH PROPOFOL ;  Surgeon: Rogelia Copping, MD;  Location: [facility];  Service: Endoscopy;  Laterality: N/A;   LAPAROSCOPIC HYSTERECTOMY  1980   POLYPECTOMY  02/21/2015   Procedure: POLYPECTOMY;  Surgeon: Rogelia Copping, MD;  Location: [facility];  Service: Endoscopy;;    FAMILY  HISTORY: Family History  Problem Relation Age of Onset   Heart disease Mother    Stroke Father    Heart disease Brother    Kidney cancer Neg Hx    Prostate cancer Neg Hx    Bladder Cancer Neg Hx    Breast cancer Neg Hx     ADVANCED DIRECTIVES (Y/N):  N  HEALTH MAINTENANCE: Social History   Tobacco Use   Smoking status: Never   Smokeless tobacco: Never  Vaping Use   Vaping status: Never Used  Substance Use Topics   Alcohol use: No    Alcohol/week: 0.0 standard drinks of alcohol   Drug use: No     Colonoscopy:  PAP:  Bone density:  Lipid panel:  Allergies  Allergen Reactions   Revlimid  [Lenalidomide ]     Current Outpatient Medications  Medication Sig Dispense Refill   aspirin EC 81 MG tablet Take 81 mg by mouth daily.     fexofenadine (ALLEGRA) 180 MG tablet Take 180 mg by mouth daily.     levothyroxine  (SYNTHROID ) 50 MCG tablet TAKE 1 TABLET BY MOUTH DAILY BEFORE BREAKFAST 90 tablet 3   Multiple Vitamins-Minerals (CENTRUM SILVER PO) Take 1 tablet by mouth daily.     pomalidomide  (POMALYST ) 2 MG capsule Take 1 capsule (2 mg total) by mouth daily. [employer] Auth # [id number]     Date Obtained 12/03/23 TAKE 1 CAPSULE BY MOUTH EVERY DAY FOR 21 DAYS ON, 7 DAYS OFF 21 capsule 0   triamcinolone  (NASACORT ) 55 MCG/ACT AERO nasal inhaler Place 2 sprays into the nose 2 (two) times daily as needed.     chlorpheniramine-HYDROcodone  (TUSSIONEX) 10-8 MG/5ML Take 5 mLs by mouth every 12 (twelve) hours as needed for cough. 140 mL 0   Cyanocobalamin (B-12 PO) Take 500 mcg by mouth daily.      EPINEPHrine  0.3 mg/0.3 mL IJ SOAJ injection Inject 0.3 mg into the muscle as needed for anaphylaxis.     levocetirizine (XYZAL ) 5 MG tablet Take 1 tablet (5 mg total) by mouth every evening. 90 tablet 1   No current facility-administered medications for this visit.    OBJECTIVE: Vitals:   01/07/24 1354  BP: 137/82  Pulse: 66  Resp: 18  Temp: 97.8 F (36.6 C)  SpO2: 98%     Body mass index  is 25 kg/m.    ECOG FS:0 - Asymptomatic  General: Well-developed, well-nourished, no acute distress. Eyes: Pink conjunctiva, anicteric sclera. HEENT: Normocephalic, moist mucous membranes. Lungs: No audible wheezing or coughing. Heart: Regular rate and rhythm. Abdomen: Soft, nontender, no obvious distention. Musculoskeletal: No edema, cyanosis, or clubbing. Neuro: Alert, answering all questions appropriately. Cranial nerves grossly intact. Skin: No rashes or petechiae noted. Psych: Normal affect.  LAB RESULTS:  Lab Results  Component Value Date   NA 136 12/26/2023   K 4.2 12/26/2023   CL 104 12/26/2023   CO2 24 12/26/2023   GLUCOSE 87 12/26/2023   BUN 16 12/26/2023   CREATININE 0.97 12/26/2023   CALCIUM 9.1 12/26/2023   PROT 6.3 (L) 12/26/2023   ALBUMIN 3.9 12/26/2023   AST 18 12/26/2023   ALT 18 12/26/2023  ALKPHOS 55 12/26/2023   BILITOT 0.7 12/26/2023   GFRNONAA 56 (L) 12/26/2023   GFRAA 40 (L) 03/01/2020    Lab Results  Component Value Date   WBC 3.4 (L) 12/26/2023   NEUTROABS 1.1 (L) 12/26/2023   HGB 12.1 12/26/2023   HCT 36.9 12/26/2023   MCV 98.7 12/26/2023   PLT 209 12/26/2023   Lab Results  Component Value Date   TOTALPROTELP 5.8 (L) 12/26/2023   ALBUMINELP 3.5 05/16/2021   A1GS 0.2 05/16/2021   A2GS 0.6 05/16/2021   BETS 0.8 05/16/2021   GAMS 0.5 05/16/2021   MSPIKE Not Observed 05/16/2021   SPEI Comment 05/16/2021     STUDIES: No results found.   ASSESSMENT:  Multiple myeloma in relapse.  PLAN:    Multiple myeloma in relapse: Bone marrow biopsy on April 09, 2016 revealed 44% plasma cells and normal cytogenetics. Given the results of her metastatic bone survey on March 12, 2016, her elevated lambda free chains, and hypercalcemia, patient fit the criteria for multiple myeloma and was initially treated with single agent Revlimid .  She could not tolerate Revlimid  and was switched to pomalidomide  2 mg daily for 21 days with a 7-day break.   Metastatic bone survey on February 12, 2019 revealed new small lesions in her calvarium.  Patient's most recent M spike is now 0.0 and her immunoglobulins remain decreased.  Lambda free light chains have been trending down since March 2021 when Pomalyst  was initiated from 5600 to 345.6.  Suspect her recent trend up and lambda free light chains may have been secondary to poor absorption from ongoing diarrhea.  They are trending back down and are 407.9.  Continue with treatment as prescribed.  Proceed with Zometa  today.  Return to clinic in 3 months with repeat laboratory, further evaluation, and continuation of treatment.  Appreciate clinical pharmacy input.   Peripheral neuropathy: Chronic and changed.  Patient was previously given a referral to acupuncture which she said did not help much.  Hypocalcemia: Mild.  Proceed with Zometa  as scheduled. Diarrhea: Improved.  Continue follow-up with GI as indicated.  I spent a total of 30 minutes reviewing chart data, face-to-face evaluation with the patient, counseling and coordination of care as detailed above.   Patient expressed understanding and was in agreement with this plan. She also understands that She can call clinic at any time with any questions, concerns, or complaints.    Evalene JINNY Reusing, MD 01/07/24 2:03 PM

## 2024-01-07 NOTE — Progress Notes (Unsigned)
 Patient is doing good, no new questions for the doctor today

## 2024-01-07 NOTE — Progress Notes (Signed)
 Oral Chemotherapy Clinic [facility]  [phone] [phone]  Patient Care Team: Liana Fish, NP as PCP - General (Nurse Practitioner) Darliss Rogue, MD as PCP - Cardiology (Cardiology) Jacobo Evalene PARAS, MD as Consulting Physician (Oncology)   Name of the patient: Rhonda [name]  [id number]  01/19/1934   Date of visit: 01/07/24  HPI: Patient is a 89 y.o. female with multiple myeloma. Currently on treatment with pomalidomide . Pomalidomide  dose was increased from 2mg  to 4 mg due to increased lambda light chains on 01/17/24.   Reason for Consult: Oral chemotherapy follow-up for pomalidomide  therapy.   PAST MEDICAL HISTORY: Past Medical History:  Diagnosis Date   Actinic keratosis    Arthritis    hands   Benign neoplasm of ascending colon    Cataract    GERD (gastroesophageal reflux disease)    Hyperlipemia    Hypertension 01/31/2015   Hypothyroidism    Melanoma (HCC) 04/28/2018   R mid to distal ant lat thigh. MM, SS, tumor thickness 0.69mm, antatomic level II. Excised: 05/13/18, margins free   Multiple myeloma (HCC) 04/18/2016   Multiple myeloma in remission (HCC) 04/18/2016   Osteoporosis    SCC (squamous cell carcinoma) 06/17/2023   R volar forearm, EDC   Shingles    Skin cancer    basal cell   Squamous cell carcinoma of skin 02/04/2014   Left pretibial. KA-like pattern   Squamous cell carcinoma of skin 09/15/2015   Right lat. superior ankle are. Superficial infiltration. Tx: EDC   Squamous cell carcinoma of skin 10/15/2016   Left medial mid lower leg. Tx: EDC   Squamous cell carcinoma of skin 11/27/2018   Right dorsum proximal forearm.   Squamous cell carcinoma of skin 09/09/2019   Right cheek lat. to mid nasolabial area. Tx: EDC   Squamous cell carcinoma of skin 12/09/2019   right pretibial below knee   Squamous cell carcinoma of skin 06/25/2022   Left ant forearm - EDC    HEMATOLOGY/ONCOLOGY HISTORY:   Oncology History  Multiple myeloma (HCC)  04/18/2016 Initial Diagnosis   Multiple myeloma (HCC)     ALLERGIES:  is allergic to revlimid  [lenalidomide ].  MEDICATIONS:  Current Outpatient Medications  Medication Sig Dispense Refill   aspirin EC 81 MG tablet Take 81 mg by mouth daily.     chlorpheniramine-HYDROcodone  (TUSSIONEX) 10-8 MG/5ML Take 5 mLs by mouth every 12 (twelve) hours as needed for cough. 140 mL 0   Cyanocobalamin (B-12 PO) Take 500 mcg by mouth daily.      EPINEPHrine  0.3 mg/0.3 mL IJ SOAJ injection Inject 0.3 mg into the muscle as needed for anaphylaxis.     fexofenadine (ALLEGRA) 180 MG tablet Take 180 mg by mouth daily.     levocetirizine (XYZAL ) 5 MG tablet Take 1 tablet (5 mg total) by mouth every evening. 90 tablet 1   levothyroxine  (SYNTHROID ) 50 MCG tablet TAKE 1 TABLET BY MOUTH DAILY BEFORE BREAKFAST 90 tablet 3   Multiple Vitamins-Minerals (CENTRUM SILVER PO) Take 1 tablet by mouth daily.     pomalidomide  (POMALYST ) 2 MG capsule Take 1 capsule (2 mg total) by mouth daily. [employer] Auth # [id number]     Date Obtained 12/03/23 TAKE 1 CAPSULE BY MOUTH EVERY DAY FOR 21 DAYS ON, 7 DAYS OFF 21 capsule 0   triamcinolone  (NASACORT ) 55 MCG/ACT AERO nasal inhaler Place 2 sprays into the nose 2 (two) times daily as needed.     No current facility-administered medications for this visit.  VITAL SIGNS: There were no vitals taken for this visit. There were no vitals filed for this visit.  Estimated body mass index is 25 kg/m as calculated from the following:   Height as of an earlier encounter on 01/07/24: 5' (1.524 m).   Weight as of an earlier encounter on 01/07/24: 58.1 kg (128 lb).  LABS: CBC:    Component Value Date/Time   WBC 3.4 (L) 12/26/2023 1035   HGB 12.1 12/26/2023 1035   HCT 36.9 12/26/2023 1035   PLT 209 12/26/2023 1035   MCV 98.7 12/26/2023 1035   NEUTROABS 1.1 (L) 12/26/2023 1035   LYMPHSABS 1.1 12/26/2023 1035   MONOABS 1.1 (H) 12/26/2023 1035    EOSABS 0.0 12/26/2023 1035   BASOSABS 0.1 12/26/2023 1035   Comprehensive Metabolic Panel:    Component Value Date/Time   NA 136 12/26/2023 1035   NA 138 07/24/2022 1044   K 4.2 12/26/2023 1035   CL 104 12/26/2023 1035   CO2 24 12/26/2023 1035   BUN 16 12/26/2023 1035   BUN 19 07/24/2022 1044   CREATININE 0.97 12/26/2023 1035   GLUCOSE 87 12/26/2023 1035   CALCIUM 9.1 12/26/2023 1035   AST 18 12/26/2023 1035   ALT 18 12/26/2023 1035   ALKPHOS 55 12/26/2023 1035   BILITOT 0.7 12/26/2023 1035   PROT 6.3 (L) 12/26/2023 1035   PROT 6.6 07/24/2022 1044   ALBUMIN 3.9 12/26/2023 1035   ALBUMIN 4.5 07/24/2022 1044     Present during today's visit: patient only  Assessment and Plan: Reviewed CBC/CMP/light chains/Ig with patient, lambda chains continue to increase, plan to dose increase patient to pomalidomide  4mg  21on/7off with next cycle Next cycle is schedule to start on 01/17/24, she will finish her current cycle as planned. Rx for 4mg  next cycle sent to pharmacy She knows to contact the office is she is having issues tolerating the new higher dose Diarrhea: patient reports her diarrhea is still present but has improved Okay to receive Zometa  today   Oral Chemotherapy Side Effect/Intolerance:  No reported side effects  Oral Chemotherapy Adherence: no missed doses reported No patient barriers to medication adherence identified.   New medications: none reported  Medication Access Issues: No issues, patient fills at [facility]  Patient expressed understanding and was in agreement with this plan. She also understands that She can call clinic at any time with any questions, concerns, or complaints.   Follow-up plan: RTC after completing next cycle ~02/14/23  Thank you for allowing me to participate in the care of this very pleasant patient.   Time Total: 15 mins  Visit consisted of counseling and education on dealing with issues of symptom management in the  setting of serious and potentially life-threatening illness.Greater than 50%  of this time was spent counseling and coordinating care related to the above assessment and plan.  Signed by: Rochester Serpe N. Abbegayle Denault, PharmD, BCPS, NEILA, CPP Hematology/Oncology Clinical Pharmacist Practitioner [place]/DB/AP Oral Chemotherapy Navigation Clinic [phone]  01/07/2024 4:24 PM

## 2024-01-08 ENCOUNTER — Encounter: Payer: Self-pay | Admitting: Oncology

## 2024-01-08 MED ORDER — POMALIDOMIDE 4 MG PO CAPS: 4.0000 mg | ORAL_CAPSULE | Freq: Every day | ORAL | 0 refills | Status: DC

## 2024-01-30 ENCOUNTER — Ambulatory Visit: Payer: Medicare PPO | Admitting: Dermatology

## 2024-02-02 ENCOUNTER — Other Ambulatory Visit: Payer: Self-pay | Admitting: Oncology

## 2024-02-02 DIAGNOSIS — C9000 Multiple myeloma not having achieved remission: Secondary | ICD-10-CM

## 2024-02-03 ENCOUNTER — Encounter: Payer: Self-pay | Admitting: Oncology

## 2024-02-03 DIAGNOSIS — Z5320 Procedure and treatment not carried out because of patient's decision for unspecified reasons: Secondary | ICD-10-CM | POA: Diagnosis not present

## 2024-02-03 DIAGNOSIS — Z1339 Encounter for screening examination for other mental health and behavioral disorders: Secondary | ICD-10-CM | POA: Diagnosis not present

## 2024-02-03 DIAGNOSIS — Z1389 Encounter for screening for other disorder: Secondary | ICD-10-CM | POA: Diagnosis not present

## 2024-02-03 DIAGNOSIS — D049 Carcinoma in situ of skin, unspecified: Secondary | ICD-10-CM | POA: Diagnosis not present

## 2024-02-03 DIAGNOSIS — Z0000 Encounter for general adult medical examination without abnormal findings: Secondary | ICD-10-CM | POA: Diagnosis not present

## 2024-02-03 DIAGNOSIS — Z1383 Encounter for screening for respiratory disorder NEC: Secondary | ICD-10-CM | POA: Diagnosis not present

## 2024-02-03 DIAGNOSIS — S32010A Wedge compression fracture of first lumbar vertebra, initial encounter for closed fracture: Secondary | ICD-10-CM | POA: Diagnosis not present

## 2024-02-03 DIAGNOSIS — Z1331 Encounter for screening for depression: Secondary | ICD-10-CM | POA: Diagnosis not present

## 2024-02-06 ENCOUNTER — Encounter: Payer: Self-pay | Admitting: Dermatology

## 2024-02-06 ENCOUNTER — Ambulatory Visit: Admitting: Dermatology

## 2024-02-06 DIAGNOSIS — L570 Actinic keratosis: Secondary | ICD-10-CM

## 2024-02-06 DIAGNOSIS — W908XXA Exposure to other nonionizing radiation, initial encounter: Secondary | ICD-10-CM

## 2024-02-06 DIAGNOSIS — D485 Neoplasm of uncertain behavior of skin: Secondary | ICD-10-CM

## 2024-02-06 DIAGNOSIS — Z85820 Personal history of malignant melanoma of skin: Secondary | ICD-10-CM

## 2024-02-06 DIAGNOSIS — Z8589 Personal history of malignant neoplasm of other organs and systems: Secondary | ICD-10-CM

## 2024-02-06 DIAGNOSIS — L820 Inflamed seborrheic keratosis: Secondary | ICD-10-CM

## 2024-02-06 DIAGNOSIS — L814 Other melanin hyperpigmentation: Secondary | ICD-10-CM | POA: Diagnosis not present

## 2024-02-06 DIAGNOSIS — D229 Melanocytic nevi, unspecified: Secondary | ICD-10-CM

## 2024-02-06 DIAGNOSIS — L578 Other skin changes due to chronic exposure to nonionizing radiation: Secondary | ICD-10-CM | POA: Diagnosis not present

## 2024-02-06 DIAGNOSIS — Z1283 Encounter for screening for malignant neoplasm of skin: Secondary | ICD-10-CM | POA: Diagnosis not present

## 2024-02-06 DIAGNOSIS — L821 Other seborrheic keratosis: Secondary | ICD-10-CM

## 2024-02-06 DIAGNOSIS — D099 Carcinoma in situ, unspecified: Secondary | ICD-10-CM

## 2024-02-06 DIAGNOSIS — D1801 Hemangioma of skin and subcutaneous tissue: Secondary | ICD-10-CM | POA: Diagnosis not present

## 2024-02-06 DIAGNOSIS — Z85828 Personal history of other malignant neoplasm of skin: Secondary | ICD-10-CM

## 2024-02-06 DIAGNOSIS — D0471 Carcinoma in situ of skin of right lower limb, including hip: Secondary | ICD-10-CM | POA: Diagnosis not present

## 2024-02-06 HISTORY — DX: Carcinoma in situ, unspecified: D09.9

## 2024-02-06 NOTE — Progress Notes (Unsigned)
 Follow-Up Visit   Subjective  Rhonda [name] is a 89 y.o. female who presents for the following: Skin Cancer Screening and Full Body Skin Exam  The patient presents for Total-Body Skin Exam (TBSE) for skin cancer screening and mole check. The patient has spots, moles and lesions to be evaluated, some may be new or changing and the patient may have concern these could be cancer.  Hx MM, SCC. Rough spot at right face, won't go away, a spot at each wrist. Not sore, no bleeding.   The following portions of the chart were reviewed this encounter and updated as appropriate: medications, allergies, medical history  Review of Systems:  No other skin or systemic complaints except as noted in HPI or Assessment and Plan.  Objective  Well appearing patient in no apparent distress; mood and affect are within normal limits.  A full examination was performed including scalp, head, eyes, ears, nose, lips, neck, chest, axillae, abdomen, back, buttocks, bilateral upper extremities, bilateral lower extremities, hands, feet, fingers, toes, fingernails, and toenails. All findings within normal limits unless otherwise noted below.   Relevant physical exam findings are noted in the Assessment and Plan.  R cheek x 1, nose x 3, R chest x 1 (5) Erythematous thin papules/macules with gritty scale.  R cheek x 2, R wrist x 1, L wrist x 1 (4) Erythematous stuck-on, waxy papule or plaque right medial knee Pink tender patch 1.1 cm   Assessment & Plan   SKIN CANCER SCREENING PERFORMED TODAY.  ACTINIC DAMAGE - Chronic condition, secondary to cumulative UV/sun exposure - diffuse scaly erythematous macules with underlying dyspigmentation - Recommend daily broad spectrum sunscreen SPF 30+ to sun-exposed areas, reapply every 2 hours as needed.  - Staying in the shade or wearing long sleeves, sun glasses (UVA+UVB protection) and wide brim hats (4-inch brim around the entire circumference of the hat) are also  recommended for sun protection.  - Call for new or changing lesions.  LENTIGINES, SEBORRHEIC KERATOSES, HEMANGIOMAS - Benign normal skin lesions - Benign-appearing - Call for any changes  MELANOCYTIC NEVI - Tan-brown and/or pink-flesh-colored symmetric macules and papules - Benign appearing on exam today - Observation - Call clinic for new or changing moles - Recommend daily use of broad spectrum spf 30+ sunscreen to sun-exposed areas.   HISTORY OF MELANOMA - R mid to distal ant lat thigh, tumor thickness 0.33mm, antatomic level II. Excised: 05/13/18, margins free  - No evidence of recurrence today - No lymphadenopathy - Recommend regular full body skin exams - Recommend daily broad spectrum sunscreen SPF 30+ to sun-exposed areas, reapply every 2 hours as needed.  - Call if any new or changing lesions are noted between office visits   HISTORY OF SQUAMOUS CELL CARCINOMA OF THE SKIN - No evidence of recurrence today - No lymphadenopathy - Recommend regular full body skin exams - Recommend daily broad spectrum sunscreen SPF 30+ to sun-exposed areas, reapply every 2 hours as needed.  - Call if any new or changing lesions are noted between office visits  AK (ACTINIC KERATOSIS) (5) R cheek x 1, nose x 3, R chest x 1 (5) Actinic keratoses are precancerous spots that appear secondary to cumulative UV radiation exposure/sun exposure over time. They are chronic with expected duration over 1 year. A portion of actinic keratoses will progress to squamous cell carcinoma of the skin. It is not possible to reliably predict which spots will progress to skin cancer and so treatment is recommended to  prevent development of skin cancer.  Recommend daily broad spectrum sunscreen SPF 30+ to sun-exposed areas, reapply every 2 hours as needed.  Recommend staying in the shade or wearing long sleeves, sun glasses (UVA+UVB protection) and wide brim hats (4-inch brim around the entire circumference of the  hat). Call for new or changing lesions. Destruction of lesion - R cheek x 1, nose x 3, R chest x 1 (5) Complexity: simple   Destruction method: cryotherapy   Informed consent: discussed and consent obtained   Timeout:  patient name, date of birth, surgical site, and procedure verified Lesion destroyed using liquid nitrogen: Yes   Region frozen until ice ball extended beyond lesion: Yes   Outcome: patient tolerated procedure well with no complications   Post-procedure details: wound care instructions given    INFLAMED SEBORRHEIC KERATOSIS (4) R cheek x 2, R wrist x 1, L wrist x 1 (4) Symptomatic, irritating, patient would like treated.  Benign-appearing.  Call clinic for new or changing lesions.   Destruction of lesion - R cheek x 2, R wrist x 1, L wrist x 1 (4) Complexity: simple   Destruction method: cryotherapy   Informed consent: discussed and consent obtained   Timeout:  patient name, date of birth, surgical site, and procedure verified Lesion destroyed using liquid nitrogen: Yes   Region frozen until ice ball extended beyond lesion: Yes   Outcome: patient tolerated procedure well with no complications   Post-procedure details: wound care instructions given    NEOPLASM OF UNCERTAIN BEHAVIOR OF SKIN right medial knee Epidermal / dermal shaving  Lesion diameter (cm):  1.1 Informed consent: discussed and consent obtained   Timeout: patient name, date of birth, surgical site, and procedure verified   Procedure prep:  Patient was prepped and draped in usual sterile fashion Prep type:  Isopropyl alcohol Anesthesia: the lesion was anesthetized in a standard fashion   Anesthetic:  1% lidocaine  w/ epinephrine  1-100,000 buffered w/ 8.4% NaHCO3 Instrument used: flexible razor blade   Hemostasis achieved with: pressure, aluminum chloride and electrodesiccation   Outcome: patient tolerated procedure well   Post-procedure details: sterile dressing applied and wound care instructions  given   Dressing type: bandage and petrolatum    Destruction of lesion Complexity: extensive   Destruction method: electrodesiccation and curettage   Informed consent: discussed and consent obtained   Timeout:  patient name, date of birth, surgical site, and procedure verified Procedure prep:  Patient was prepped and draped in usual sterile fashion Prep type:  Isopropyl alcohol Anesthesia: the lesion was anesthetized in a standard fashion   Anesthetic:  1% lidocaine  w/ epinephrine  1-100,000 buffered w/ 8.4% NaHCO3 Curettage performed in three different directions: Yes   Electrodesiccation performed over the curetted area: Yes   Final wound size (cm):  1.1 Hemostasis achieved with:  pressure, aluminum chloride and electrodesiccation Outcome: patient tolerated procedure well with no complications   Post-procedure details: sterile dressing applied and wound care instructions given   Dressing type: bandage and petrolatum    Specimen 1 - Surgical pathology Differential Diagnosis: BCC vs SCC  Check Margins: No EDC at time of bx Return in about 6 months (around 08/08/2024) for with Dr. MARLA, TBSE, HxMM, HxSCC.  LILLETTE Lonell Drones, RMA, am acting as scribe for Alm Rhyme, MD .   Documentation: I have reviewed the above documentation for accuracy and completeness, and I agree with the above.  Alm Rhyme, MD

## 2024-02-06 NOTE — Patient Instructions (Addendum)

## 2024-02-11 LAB — SURGICAL PATHOLOGY

## 2024-02-12 ENCOUNTER — Ambulatory Visit: Payer: Self-pay | Admitting: Dermatology

## 2024-02-12 ENCOUNTER — Encounter: Payer: Self-pay | Admitting: Dermatology

## 2024-02-12 NOTE — Telephone Encounter (Signed)
 Advised pt of bx resultl./sh

## 2024-02-12 NOTE — Telephone Encounter (Signed)
-----   Message from Alm Rhyme sent at 02/12/2024  1:51 PM EDT ----- FINAL DIAGNOSIS        1. Skin, right medial knee :       SQUAMOUS CELL CARCINOMA IN SITU   Cancer = SCCis Superficial Already treated Recheck next visit ----- Message ----- From: Interface, Lab In Three Zero One Sent: 02/11/2024   4:48 PM EDT To: Alm JAYSON Rhyme, MD

## 2024-02-13 ENCOUNTER — Other Ambulatory Visit

## 2024-02-13 ENCOUNTER — Inpatient Hospital Stay: Admitting: Pharmacist

## 2024-02-13 ENCOUNTER — Encounter: Payer: Self-pay | Admitting: Oncology

## 2024-02-13 ENCOUNTER — Ambulatory Visit: Admitting: Oncology

## 2024-02-13 ENCOUNTER — Inpatient Hospital Stay: Attending: Oncology

## 2024-02-13 ENCOUNTER — Inpatient Hospital Stay (HOSPITAL_BASED_OUTPATIENT_CLINIC_OR_DEPARTMENT_OTHER): Admitting: Oncology

## 2024-02-13 VITALS — BP 126/71 | HR 67 | Temp 97.6°F | Resp 18 | Ht 60.0 in | Wt 123.0 lb

## 2024-02-13 DIAGNOSIS — C9002 Multiple myeloma in relapse: Secondary | ICD-10-CM | POA: Insufficient documentation

## 2024-02-13 DIAGNOSIS — Z85828 Personal history of other malignant neoplasm of skin: Secondary | ICD-10-CM | POA: Diagnosis not present

## 2024-02-13 DIAGNOSIS — C9000 Multiple myeloma not having achieved remission: Secondary | ICD-10-CM

## 2024-02-13 DIAGNOSIS — G629 Polyneuropathy, unspecified: Secondary | ICD-10-CM | POA: Insufficient documentation

## 2024-02-13 DIAGNOSIS — I10 Essential (primary) hypertension: Secondary | ICD-10-CM | POA: Insufficient documentation

## 2024-02-13 DIAGNOSIS — D72819 Decreased white blood cell count, unspecified: Secondary | ICD-10-CM | POA: Insufficient documentation

## 2024-02-13 DIAGNOSIS — M810 Age-related osteoporosis without current pathological fracture: Secondary | ICD-10-CM | POA: Insufficient documentation

## 2024-02-13 LAB — CMP (CANCER CENTER ONLY)
ALT: 18 U/L (ref 0–44)
AST: 17 U/L (ref 15–41)
Albumin: 4 g/dL (ref 3.5–5.0)
Alkaline Phosphatase: 61 U/L (ref 38–126)
Anion gap: 8 (ref 5–15)
BUN: 17 mg/dL (ref 8–23)
CO2: 21 mmol/L — ABNORMAL LOW (ref 22–32)
Calcium: 8.9 mg/dL (ref 8.9–10.3)
Chloride: 105 mmol/L (ref 98–111)
Creatinine: 0.9 mg/dL (ref 0.44–1.00)
GFR, Estimated: >60 mL/min (ref >60)
Glucose, Bld: 102 mg/dL — ABNORMAL HIGH (ref 70–99)
Potassium: 3.9 mmol/L (ref 3.5–5.1)
Sodium: 134 mmol/L — ABNORMAL LOW (ref 135–145)
Total Bilirubin: 0.6 mg/dL (ref 0.0–1.2)
Total Protein: 6.6 g/dL (ref 6.5–8.1)

## 2024-02-13 LAB — CBC WITH DIFFERENTIAL (CANCER CENTER ONLY)
Abs Immature Granulocytes: 0.01 K/uL (ref 0.00–0.07)
Basophils Absolute: 0.1 K/uL (ref 0.0–0.1)
Basophils Relative: 3 %
Eosinophils Absolute: 0 K/uL (ref 0.0–0.5)
Eosinophils Relative: 1 %
HCT: 37.7 % (ref 36.0–46.0)
Hemoglobin: 12.7 g/dL (ref 12.0–15.0)
Immature Granulocytes: 0 %
Lymphocytes Relative: 36 %
Lymphs Abs: 1.1 K/uL (ref 0.7–4.0)
MCH: 32.6 pg (ref 26.0–34.0)
MCHC: 33.7 g/dL (ref 30.0–36.0)
MCV: 96.7 fL (ref 80.0–100.0)
Monocytes Absolute: 0.8 K/uL (ref 0.1–1.0)
Monocytes Relative: 25 %
Neutro Abs: 1.1 K/uL — ABNORMAL LOW (ref 1.7–7.7)
Neutrophils Relative %: 35 %
Platelet Count: 203 K/uL (ref 150–400)
RBC: 3.9 MIL/uL (ref 3.87–5.11)
RDW: 13.4 % (ref 11.5–15.5)
WBC Count: 3 K/uL — ABNORMAL LOW (ref 4.0–10.5)
nRBC: 0 % (ref 0.0–0.2)

## 2024-02-13 NOTE — Progress Notes (Signed)
 Oral Chemotherapy Clinic [facility]  [phone] Fax:([phone]  Patient Care Team: Liana Fish, NP as PCP - General (Nurse Practitioner) Darliss Rogue, MD as PCP - Cardiology (Cardiology) Jacobo Evalene PARAS, MD as Consulting Physician (Oncology)   Name of the patient: Rhonda [name]  [id number]  10-30-1933   Date of visit: 02/13/24  HPI: Patient is a 89 y.o. female with multiple myeloma. Currently on treatment with pomalidomide . Pomalidomide  dose was increased from 2mg  to 4 mg due to increased lambda light chains on 01/17/24.   Reason for Consult: Oral chemotherapy follow-up for pomalidomide  therapy.   PAST MEDICAL HISTORY: Past Medical History:  Diagnosis Date   Actinic keratosis    Arthritis    hands   Benign neoplasm of ascending colon    Cataract    GERD (gastroesophageal reflux disease)    Hyperlipemia    Hypertension 01/31/2015   Hypothyroidism    Melanoma (HCC) 04/28/2018   R mid to distal ant lat thigh. MM, SS, tumor thickness 0.21mm, antatomic level II. Excised: 05/13/18, margins free   Multiple myeloma (HCC) 04/18/2016   Multiple myeloma in remission (HCC) 04/18/2016   Osteoporosis    SCC (squamous cell carcinoma) 06/17/2023   R volar forearm, EDC   Shingles    Skin cancer    basal cell   Squamous cell carcinoma in situ (SCCIS) 02/06/2024   right medial knee, EDC   Squamous cell carcinoma of skin 02/04/2014   Left pretibial. KA-like pattern   Squamous cell carcinoma of skin 09/15/2015   Right lat. superior ankle are. Superficial infiltration. Tx: EDC   Squamous cell carcinoma of skin 10/15/2016   Left medial mid lower leg. Tx: EDC   Squamous cell carcinoma of skin 11/27/2018   Right dorsum proximal forearm.   Squamous cell carcinoma of skin 09/09/2019   Right cheek lat. to mid nasolabial area. Tx: EDC   Squamous cell carcinoma of skin 12/09/2019   right pretibial below knee   Squamous cell carcinoma of skin  06/25/2022   Left ant forearm - EDC    HEMATOLOGY/ONCOLOGY HISTORY:  Oncology History  Multiple myeloma (HCC)  04/18/2016 Initial Diagnosis   Multiple myeloma (HCC)     ALLERGIES:  is allergic to revlimid  [lenalidomide ].  MEDICATIONS:  Current Outpatient Medications  Medication Sig Dispense Refill   aspirin EC 81 MG tablet Take 81 mg by mouth daily.     Cyanocobalamin (B-12 PO) Take 500 mcg by mouth daily.      EPINEPHrine  0.3 mg/0.3 mL IJ SOAJ injection Inject 0.3 mg into the muscle as needed for anaphylaxis.     fexofenadine (ALLEGRA) 180 MG tablet Take 180 mg by mouth daily.     levothyroxine  (SYNTHROID ) 50 MCG tablet TAKE 1 TABLET BY MOUTH DAILY BEFORE BREAKFAST 90 tablet 3   Multiple Vitamins-Minerals (CENTRUM SILVER PO) Take 1 tablet by mouth daily.     POMALYST  4 MG capsule TAKE 1 CAPSULE BY MOUTH EVERY DAY FOR 21 DAYS THEN HOLD FOR 7 DAYS, REPEAT EVERY 28 DAYS 21 capsule 0   triamcinolone  (NASACORT ) 55 MCG/ACT AERO nasal inhaler Place 2 sprays into the nose 2 (two) times daily as needed.     No current facility-administered medications for this visit.    VITAL SIGNS: There were no vitals taken for this visit. There were no vitals filed for this visit.  Estimated body mass index is 24.02 kg/m as calculated from the following:   Height as of an earlier encounter on 02/13/24:  5' (1.524 m).   Weight as of an earlier encounter on 02/13/24: 55.8 kg (123 lb).  LABS: CBC:    Component Value Date/Time   WBC 3.0 (L) 02/13/2024 1419   WBC 3.4 (L) 12/26/2023 1035   HGB 12.7 02/13/2024 1419   HCT 37.7 02/13/2024 1419   PLT 203 02/13/2024 1419   MCV 96.7 02/13/2024 1419   NEUTROABS 1.1 (L) 02/13/2024 1419   LYMPHSABS 1.1 02/13/2024 1419   MONOABS 0.8 02/13/2024 1419   EOSABS 0.0 02/13/2024 1419   BASOSABS 0.1 02/13/2024 1419   Comprehensive Metabolic Panel:    Component Value Date/Time   NA 134 (L) 02/13/2024 1419   NA 138 07/24/2022 1044   K 3.9 02/13/2024 1419   CL  105 02/13/2024 1419   CO2 21 (L) 02/13/2024 1419   BUN 17 02/13/2024 1419   BUN 19 07/24/2022 1044   CREATININE 0.90 02/13/2024 1419   GLUCOSE 102 (H) 02/13/2024 1419   CALCIUM 8.9 02/13/2024 1419   AST 17 02/13/2024 1419   ALT 18 02/13/2024 1419   ALKPHOS 61 02/13/2024 1419   BILITOT 0.6 02/13/2024 1419   PROT 6.6 02/13/2024 1419   PROT 6.6 07/24/2022 1044   ALBUMIN 4.0 02/13/2024 1419   ALBUMIN 4.5 07/24/2022 1044     Present during today's visit: patient only  Assessment and Plan: Reviewed CBC/CMP Patient recently increased pomalidomide  dose from 2mg  to 4mg  due to lambda light chains increasing, light chains from today are pending Patient is tolerating dose increase well, no reported fatigue, nausea or changes in bowel movements Continue to increase, plan to dose increase patient to pomalidomide  4mg  21on/7off with next cycle is schedule to start on 02/15/24, Okay to receive Zometa  today   Oral Chemotherapy Side Effect/Intolerance:  No reported side effects  Oral Chemotherapy Adherence: no missed doses reported No patient barriers to medication adherence identified.   New medications: none reported  Medication Access Issues: No issues, patient fills at [facility]  Patient expressed understanding and was in agreement with this plan. She also understands that She can call clinic at any time with any questions, concerns, or complaints.   Follow-up plan: RTC as scheduled  Thank you for allowing me to participate in the care of this very pleasant patient.   Time Total: 15 mins  Visit consisted of counseling and education on dealing with issues of symptom management in the setting of serious and potentially life-threatening illness.Greater than 50%  of this time was spent counseling and coordinating care related to the above assessment and plan.  Signed by: Malee Grays N. Danyel Tobey, PharmD, BCPS, NEILA, CPP Hematology/Oncology Clinical Pharmacist  Practitioner [place]/DB/AP Oral Chemotherapy Navigation Clinic [phone]  02/13/2024 4:03 PM

## 2024-02-13 NOTE — Progress Notes (Unsigned)
 [facility]  [phone] [phone]  ID: Rhonda [name] OB: Mar 02, 1934  MR#: [id number]  RDW#:[id number]  Patient Care Team: Liana Fish, NP as PCP - General (Nurse Practitioner) Darliss Rogue, MD as PCP - Cardiology (Cardiology) Jacobo Evalene PARAS, MD as Consulting Physician (Oncology)   CHIEF COMPLAINT:  Multiple myeloma in relapse.  INTERVAL HISTORY: Patient returns to clinic today for repeat laboratory work, further evaluation, and to assess her toleration of increased dose of Pomalyst .  She is tolerating treatment well without significant side effects.  She currently feels well and is asymptomatic. She has a chronic peripheral neuropathy, but no other neurologic complaints.  She denies any fevers. She has a good appetite and denies weight loss.  She denies any chest pain, shortness of breath, cough, or hemoptysis.  She denies any nausea, vomiting, constipation, or diarrhea.  She has no melena or hematochezia.  She has no urinary complaints.  Patient offers no further specific complaints today.  REVIEW OF SYSTEMS:   Review of Systems  Constitutional: Negative.  Negative for fever, malaise/fatigue and weight loss.  HENT:  Negative for congestion and sore throat.   Respiratory: Negative.  Negative for cough and shortness of breath.   Cardiovascular: Negative.  Negative for chest pain, palpitations and leg swelling.  Gastrointestinal: Negative.  Negative for abdominal pain, blood in stool, constipation, diarrhea, melena, nausea and vomiting.  Genitourinary: Negative.  Negative for dysuria.  Musculoskeletal: Negative.  Negative for back pain.  Skin: Negative.  Negative for rash.  Neurological:  Positive for tingling and sensory change. Negative for focal weakness and weakness.  Psychiatric/Behavioral: Negative.  The patient is not nervous/anxious and does not have insomnia.     As per HPI. Otherwise, a complete review of systems is  negative.  PAST MEDICAL HISTORY: Past Medical History:  Diagnosis Date   Actinic keratosis    Arthritis    hands   Benign neoplasm of ascending colon    Cataract    GERD (gastroesophageal reflux disease)    Hyperlipemia    Hypertension 01/31/2015   Hypothyroidism    Melanoma (HCC) 04/28/2018   R mid to distal ant lat thigh. MM, SS, tumor thickness 0.55mm, antatomic level II. Excised: 05/13/18, margins free   Multiple myeloma (HCC) 04/18/2016   Multiple myeloma in remission (HCC) 04/18/2016   Osteoporosis    SCC (squamous cell carcinoma) 06/17/2023   R volar forearm, ED[facility]   Shingles    Skin cancer    basal cell   Squamous cell carcinoma in situ (SCCIS) 02/06/2024   right medial knee, [facility]   Squamous cell carcinoma of skin 02/04/2014   Left pretibial. KA-like pattern   Squamous cell carcinoma of skin 09/15/2015   Right lat. superior ankle are. Superficial infiltration. Tx: [facility]   Squamous cell carcinoma of skin 10/15/2016   Left medial mid lower leg. Tx: [facility]   Squamous cell carcinoma of skin 11/27/2018   Right dorsum proximal forearm.   Squamous cell carcinoma of skin 09/09/2019   Right cheek lat. to mid nasolabial area. Tx: ED[facility]   Squamous cell carcinoma of skin 12/09/2019   right pretibial below knee   Squamous cell carcinoma of skin 06/25/2022   Left ant forearm - [facility]    PAST SURGICAL HISTORY: Past Surgical History:  Procedure Laterality Date   BREAST EXCISIONAL BIOPSY Right 15+ yrs ago   EXCISIONAL - NEG   CATARACT EXTRACTION W/ INTRAOCULAR LENS IMPLANT     CHOLECYSTECTOMY N/A 03/14/2017  Procedure: LAPAROSCOPIC CHOLECYSTECTOMY;  Surgeon: Wonda Charlie BRAVO, MD;  Location: [facility];  Service: General;  Laterality: N/A;   COLONOSCOPY WITH PROPOFOL  N/A 02/21/2015   Procedure: COLONOSCOPY WITH PROPOFOL ;  Surgeon: Rogelia Copping, MD;  Location: [facility];  Service: Endoscopy;  Laterality: N/A;   LAPAROSCOPIC HYSTERECTOMY  1980   POLYPECTOMY  02/21/2015   Procedure:  POLYPECTOMY;  Surgeon: Rogelia Copping, MD;  Location: [facility];  Service: Endoscopy;;    FAMILY HISTORY: Family History  Problem Relation Age of Onset   Heart disease Mother    Stroke Father    Heart disease Brother    Kidney cancer Neg Hx    Prostate cancer Neg Hx    Bladder Cancer Neg Hx    Breast cancer Neg Hx     ADVANCED DIRECTIVES (Y/N):  N  HEALTH MAINTENANCE: Social History   Tobacco Use   Smoking status: Never   Smokeless tobacco: Never  Vaping Use   Vaping status: Never Used  Substance Use Topics   Alcohol use: No    Alcohol/week: 0.0 standard drinks of alcohol   Drug use: No     Colonoscopy:  PAP:  Bone density:  Lipid panel:  Allergies  Allergen Reactions   Revlimid  [Lenalidomide ]     Current Outpatient Medications  Medication Sig Dispense Refill   aspirin EC 81 MG tablet Take 81 mg by mouth daily.     Cyanocobalamin (B-12 PO) Take 500 mcg by mouth daily.      EPINEPHrine  0.3 mg/0.3 mL IJ SOAJ injection Inject 0.3 mg into the muscle as needed for anaphylaxis.     fexofenadine (ALLEGRA) 180 MG tablet Take 180 mg by mouth daily.     levothyroxine  (SYNTHROID ) 50 MCG tablet TAKE 1 TABLET BY MOUTH DAILY BEFORE BREAKFAST 90 tablet 3   Multiple Vitamins-Minerals (CENTRUM SILVER PO) Take 1 tablet by mouth daily.     POMALYST  4 MG capsule TAKE 1 CAPSULE BY MOUTH EVERY DAY FOR 21 DAYS THEN HOLD FOR 7 DAYS, REPEAT EVERY 28 DAYS 21 capsule 0   triamcinolone  (NASACORT ) 55 MCG/ACT AERO nasal inhaler Place 2 sprays into the nose 2 (two) times daily as needed.     No current facility-administered medications for this visit.    OBJECTIVE: Vitals:   02/13/24 1446  BP: 126/71  Pulse: 67  Resp: 18  Temp: 97.6 F (36.4 C)  SpO2: 100%     Body mass index is 24.02 kg/m.    ECOG FS:0 - Asymptomatic  General: Well-developed, well-nourished, no acute distress. Eyes: Pink conjunctiva, anicteric sclera. HEENT: Normocephalic, moist mucous membranes. Lungs:  No audible wheezing or coughing. Heart: Regular rate and rhythm. Abdomen: Soft, nontender, no obvious distention. Musculoskeletal: No edema, cyanosis, or clubbing. Neuro: Alert, answering all questions appropriately. Cranial nerves grossly intact. Skin: No rashes or petechiae noted. Psych: Normal affect.  LAB RESULTS:  Lab Results  Component Value Date   NA 134 (L) 02/13/2024   K 3.9 02/13/2024   CL 105 02/13/2024   CO2 21 (L) 02/13/2024   GLUCOSE 102 (H) 02/13/2024   BUN 17 02/13/2024   CREATININE 0.90 02/13/2024   CALCIUM 8.9 02/13/2024   PROT 6.6 02/13/2024   ALBUMIN 4.0 02/13/2024   AST 17 02/13/2024   ALT 18 02/13/2024   ALKPHOS 61 02/13/2024   BILITOT 0.6 02/13/2024   GFRNONAA >60 02/13/2024   GFRAA 40 (L) 03/01/2020    Lab Results  Component Value Date   WBC 3.0 (L) 02/13/2024  NEUTROABS 1.1 (L) 02/13/2024   HGB 12.7 02/13/2024   HCT 37.7 02/13/2024   MCV 96.7 02/13/2024   PLT 203 02/13/2024   Lab Results  Component Value Date   TOTALPROTELP 5.8 (L) 12/26/2023   ALBUMINELP 3.5 05/16/2021   A1GS 0.2 05/16/2021   A2GS 0.6 05/16/2021   BETS 0.8 05/16/2021   GAMS 0.5 05/16/2021   MSPIKE Not Observed 05/16/2021   SPEI Comment 05/16/2021     STUDIES: No results found.   ASSESSMENT:  Multiple myeloma in relapse.  PLAN:    Multiple myeloma in relapse: Bone marrow biopsy on April 09, 2016 revealed 44% plasma cells and normal cytogenetics. Given the results of her metastatic bone survey on March 12, 2016, her elevated lambda free chains, and hypercalcemia, patient fit the criteria for multiple myeloma and was initially treated with single agent Revlimid .  She could not tolerate Revlimid  and was switched to pomalidomide  2 mg daily for 21 days with a 7-day break.  Metastatic bone survey on February 12, 2019 revealed new small lesions in her calvarium.  Patient's most recent M spike is now 0.0 and her immunoglobulins remain decreased.  Lambda free light  chains have been trending down since March 2021 when Pomalyst  was initiated from 5600 to 345.6.  More recently, her lambda light chains have trended up and are now 665.9.  After discussion with the patient, she agreed to increased her Pomalyst  dose to 4 mg every 21 days with 7 days off.  Repeat lambda free light chains from today are pending at time of dictation.  No further intervention is needed.  Return to clinic in 2 months for repeat laboratory work, further evaluation, and continuation of Zometa .  Appreciate clinical pharmacy input. Peripheral neuropathy: Chronic and unchanged.  Patient was previously given a referral to acupuncture which she said did not help much.  Hypocalcemia: Resolved.   Leukopenia: Mild, monitor.  Continue Pomalyst  as prescribed. Diarrhea: Resolved.  Continue follow-up with GI as indicated.   Patient expressed understanding and was in agreement with this plan. She also understands that She can call clinic at any time with any questions, concerns, or complaints.    Evalene JINNY Reusing, MD 02/14/24 12:25 PM

## 2024-02-13 NOTE — Progress Notes (Unsigned)
 Patient is doing well, no new questions for the doctor today.

## 2024-02-14 ENCOUNTER — Encounter: Payer: Self-pay | Admitting: Oncology

## 2024-02-14 LAB — IGG, IGA, IGM
IgA: 128 mg/dL (ref 64–422)
IgG (Immunoglobin G), Serum: 719 mg/dL (ref 586–1602)
IgM (Immunoglobulin M), Srm: 32 mg/dL (ref 26–217)

## 2024-02-14 LAB — KAPPA/LAMBDA LIGHT CHAINS
Kappa free light chain: 24.2 mg/L — ABNORMAL HIGH (ref 3.3–19.4)
Kappa, lambda light chain ratio: 0.03 — ABNORMAL LOW (ref 0.26–1.65)
Lambda free light chains: 851.8 mg/L — ABNORMAL HIGH (ref 5.7–26.3)

## 2024-02-17 LAB — IMMUNOFIXATION ELECTROPHORESIS
IgA: 126 mg/dL (ref 64–422)
IgG (Immunoglobin G), Serum: 718 mg/dL (ref 586–1602)
IgM (Immunoglobulin M), Srm: 26 mg/dL (ref 26–217)
Total Protein ELP: 6.1 g/dL (ref 6.0–8.5)

## 2024-02-26 ENCOUNTER — Other Ambulatory Visit: Payer: Self-pay | Admitting: Oncology

## 2024-02-26 DIAGNOSIS — C9000 Multiple myeloma not having achieved remission: Secondary | ICD-10-CM

## 2024-03-13 ENCOUNTER — Other Ambulatory Visit: Payer: Self-pay | Admitting: *Deleted

## 2024-03-13 DIAGNOSIS — C9000 Multiple myeloma not having achieved remission: Secondary | ICD-10-CM

## 2024-03-16 ENCOUNTER — Inpatient Hospital Stay: Attending: Oncology

## 2024-03-16 DIAGNOSIS — M810 Age-related osteoporosis without current pathological fracture: Secondary | ICD-10-CM | POA: Insufficient documentation

## 2024-03-16 DIAGNOSIS — I10 Essential (primary) hypertension: Secondary | ICD-10-CM | POA: Insufficient documentation

## 2024-03-16 DIAGNOSIS — G629 Polyneuropathy, unspecified: Secondary | ICD-10-CM | POA: Insufficient documentation

## 2024-03-16 DIAGNOSIS — C9000 Multiple myeloma not having achieved remission: Secondary | ICD-10-CM

## 2024-03-16 DIAGNOSIS — Z85828 Personal history of other malignant neoplasm of skin: Secondary | ICD-10-CM | POA: Diagnosis not present

## 2024-03-16 DIAGNOSIS — D72819 Decreased white blood cell count, unspecified: Secondary | ICD-10-CM | POA: Insufficient documentation

## 2024-03-16 DIAGNOSIS — C9002 Multiple myeloma in relapse: Secondary | ICD-10-CM | POA: Insufficient documentation

## 2024-03-16 LAB — CBC WITH DIFFERENTIAL/PLATELET
Abs Immature Granulocytes: 0.01 K/uL (ref 0.00–0.07)
Basophils Absolute: 0.1 K/uL (ref 0.0–0.1)
Basophils Relative: 4 %
Eosinophils Absolute: 0 K/uL (ref 0.0–0.5)
Eosinophils Relative: 1 %
HCT: 36.2 % (ref 36.0–46.0)
Hemoglobin: 12.5 g/dL (ref 12.0–15.0)
Immature Granulocytes: 0 %
Lymphocytes Relative: 28 %
Lymphs Abs: 0.9 K/uL (ref 0.7–4.0)
MCH: 33.1 pg (ref 26.0–34.0)
MCHC: 34.5 g/dL (ref 30.0–36.0)
MCV: 95.8 fL (ref 80.0–100.0)
Monocytes Absolute: 1.1 K/uL — ABNORMAL HIGH (ref 0.1–1.0)
Monocytes Relative: 34 %
Neutro Abs: 1.2 K/uL — ABNORMAL LOW (ref 1.7–7.7)
Neutrophils Relative %: 33 %
Platelets: 205 K/uL (ref 150–400)
RBC: 3.78 MIL/uL — ABNORMAL LOW (ref 3.87–5.11)
RDW: 13.5 % (ref 11.5–15.5)
WBC: 3.4 K/uL — ABNORMAL LOW (ref 4.0–10.5)
nRBC: 0 % (ref 0.0–0.2)

## 2024-03-16 LAB — CMP (CANCER CENTER ONLY)
ALT: 14 U/L (ref 0–44)
AST: 16 U/L (ref 15–41)
Albumin: 3.9 g/dL (ref 3.5–5.0)
Alkaline Phosphatase: 55 U/L (ref 38–126)
Anion gap: 6 (ref 5–15)
BUN: 23 mg/dL (ref 8–23)
CO2: 24 mmol/L (ref 22–32)
Calcium: 9.1 mg/dL (ref 8.9–10.3)
Chloride: 108 mmol/L (ref 98–111)
Creatinine: 0.98 mg/dL (ref 0.44–1.00)
GFR, Estimated: 56 mL/min — ABNORMAL LOW (ref >60)
Glucose, Bld: 91 mg/dL (ref 70–99)
Potassium: 3.8 mmol/L (ref 3.5–5.1)
Sodium: 138 mmol/L (ref 135–145)
Total Bilirubin: 0.7 mg/dL (ref 0.0–1.2)
Total Protein: 6.4 g/dL — ABNORMAL LOW (ref 6.5–8.1)

## 2024-03-17 LAB — KAPPA/LAMBDA LIGHT CHAINS
Kappa free light chain: 20.3 mg/L — ABNORMAL HIGH (ref 3.3–19.4)
Kappa, lambda light chain ratio: 0.03 — ABNORMAL LOW (ref 0.26–1.65)
Lambda free light chains: 759 mg/L — ABNORMAL HIGH (ref 5.7–26.3)

## 2024-03-18 LAB — IGG, IGA, IGM
IgA: 117 mg/dL (ref 64–422)
IgG (Immunoglobin G), Serum: 702 mg/dL (ref 586–1602)
IgM (Immunoglobulin M), Srm: 18 mg/dL — ABNORMAL LOW (ref 26–217)

## 2024-03-18 LAB — IMMUNOFIXATION ELECTROPHORESIS
IgA: 118 mg/dL (ref 64–422)
IgG (Immunoglobin G), Serum: 690 mg/dL (ref 586–1602)
IgM (Immunoglobulin M), Srm: 19 mg/dL — ABNORMAL LOW (ref 26–217)
Total Protein ELP: 5.9 g/dL — ABNORMAL LOW (ref 6.0–8.5)

## 2024-03-25 ENCOUNTER — Telehealth: Payer: Self-pay | Admitting: Nurse Practitioner

## 2024-03-25 NOTE — Telephone Encounter (Signed)
 Called patient to reschedule her cancelled wellness visit. She stated she is dealing with her cancer right. Will call back to r/s-Toni

## 2024-03-29 ENCOUNTER — Other Ambulatory Visit: Payer: Self-pay | Admitting: Oncology

## 2024-03-29 DIAGNOSIS — C9000 Multiple myeloma not having achieved remission: Secondary | ICD-10-CM

## 2024-03-30 ENCOUNTER — Encounter: Payer: Self-pay | Admitting: Oncology

## 2024-03-31 ENCOUNTER — Ambulatory Visit: Payer: Medicare PPO | Admitting: Nurse Practitioner

## 2024-03-31 DIAGNOSIS — J301 Allergic rhinitis due to pollen: Secondary | ICD-10-CM | POA: Diagnosis not present

## 2024-04-10 ENCOUNTER — Other Ambulatory Visit: Payer: Self-pay | Admitting: *Deleted

## 2024-04-10 DIAGNOSIS — C9000 Multiple myeloma not having achieved remission: Secondary | ICD-10-CM

## 2024-04-13 ENCOUNTER — Inpatient Hospital Stay: Attending: Oncology

## 2024-04-13 DIAGNOSIS — M810 Age-related osteoporosis without current pathological fracture: Secondary | ICD-10-CM | POA: Insufficient documentation

## 2024-04-13 DIAGNOSIS — R197 Diarrhea, unspecified: Secondary | ICD-10-CM | POA: Insufficient documentation

## 2024-04-13 DIAGNOSIS — I10 Essential (primary) hypertension: Secondary | ICD-10-CM | POA: Insufficient documentation

## 2024-04-13 DIAGNOSIS — G629 Polyneuropathy, unspecified: Secondary | ICD-10-CM | POA: Diagnosis not present

## 2024-04-13 DIAGNOSIS — C9000 Multiple myeloma not having achieved remission: Secondary | ICD-10-CM

## 2024-04-13 DIAGNOSIS — C9002 Multiple myeloma in relapse: Secondary | ICD-10-CM | POA: Insufficient documentation

## 2024-04-13 DIAGNOSIS — Z85828 Personal history of other malignant neoplasm of skin: Secondary | ICD-10-CM | POA: Diagnosis not present

## 2024-04-13 DIAGNOSIS — E871 Hypo-osmolality and hyponatremia: Secondary | ICD-10-CM | POA: Diagnosis not present

## 2024-04-13 LAB — CMP (CANCER CENTER ONLY)
ALT: 16 U/L (ref 0–44)
AST: 19 U/L (ref 15–41)
Albumin: 4 g/dL (ref 3.5–5.0)
Alkaline Phosphatase: 61 U/L (ref 38–126)
Anion gap: 10 (ref 5–15)
BUN: 22 mg/dL (ref 8–23)
CO2: 21 mmol/L — ABNORMAL LOW (ref 22–32)
Calcium: 9 mg/dL (ref 8.9–10.3)
Chloride: 100 mmol/L (ref 98–111)
Creatinine: 0.92 mg/dL (ref 0.44–1.00)
GFR, Estimated: 60 mL/min — ABNORMAL LOW (ref >60)
Glucose, Bld: 105 mg/dL — ABNORMAL HIGH (ref 70–99)
Potassium: 4 mmol/L (ref 3.5–5.1)
Sodium: 131 mmol/L — ABNORMAL LOW (ref 135–145)
Total Bilirubin: 1 mg/dL (ref 0.0–1.2)
Total Protein: 7 g/dL (ref 6.5–8.1)

## 2024-04-13 LAB — CBC WITH DIFFERENTIAL/PLATELET
Abs Immature Granulocytes: 0.01 K/uL (ref 0.00–0.07)
Basophils Absolute: 0.1 K/uL (ref 0.0–0.1)
Basophils Relative: 3 %
Eosinophils Absolute: 0 K/uL (ref 0.0–0.5)
Eosinophils Relative: 1 %
HCT: 38.6 % (ref 36.0–46.0)
Hemoglobin: 13.1 g/dL (ref 12.0–15.0)
Immature Granulocytes: 0 %
Lymphocytes Relative: 30 %
Lymphs Abs: 1.2 K/uL (ref 0.7–4.0)
MCH: 32.6 pg (ref 26.0–34.0)
MCHC: 33.9 g/dL (ref 30.0–36.0)
MCV: 96 fL (ref 80.0–100.0)
Monocytes Absolute: 1.5 K/uL — ABNORMAL HIGH (ref 0.1–1.0)
Monocytes Relative: 37 %
Neutro Abs: 1.2 K/uL — ABNORMAL LOW (ref 1.7–7.7)
Neutrophils Relative %: 29 %
Platelets: 278 K/uL (ref 150–400)
RBC: 4.02 MIL/uL (ref 3.87–5.11)
RDW: 13.8 % (ref 11.5–15.5)
WBC: 4.1 K/uL (ref 4.0–10.5)
nRBC: 0 % (ref 0.0–0.2)

## 2024-04-14 LAB — KAPPA/LAMBDA LIGHT CHAINS
Kappa free light chain: 24 mg/L — ABNORMAL HIGH (ref 3.3–19.4)
Kappa, lambda light chain ratio: 0.03 — ABNORMAL LOW (ref 0.26–1.65)
Lambda free light chains: 958.7 mg/L — ABNORMAL HIGH (ref 5.7–26.3)

## 2024-04-14 LAB — IGG, IGA, IGM
IgA: 133 mg/dL (ref 64–422)
IgG (Immunoglobin G), Serum: 724 mg/dL (ref 586–1602)
IgM (Immunoglobulin M), Srm: 37 mg/dL (ref 26–217)

## 2024-04-16 ENCOUNTER — Inpatient Hospital Stay

## 2024-04-16 ENCOUNTER — Inpatient Hospital Stay: Admitting: Pharmacist

## 2024-04-16 ENCOUNTER — Encounter: Payer: Self-pay | Admitting: Oncology

## 2024-04-16 ENCOUNTER — Inpatient Hospital Stay: Admitting: Oncology

## 2024-04-16 VITALS — BP 123/59 | HR 69 | Temp 97.8°F

## 2024-04-16 VITALS — BP 124/57 | HR 67 | Temp 98.0°F | Resp 18 | Wt 123.7 lb

## 2024-04-16 DIAGNOSIS — M810 Age-related osteoporosis without current pathological fracture: Secondary | ICD-10-CM | POA: Diagnosis not present

## 2024-04-16 DIAGNOSIS — E871 Hypo-osmolality and hyponatremia: Secondary | ICD-10-CM | POA: Diagnosis not present

## 2024-04-16 DIAGNOSIS — C9000 Multiple myeloma not having achieved remission: Secondary | ICD-10-CM

## 2024-04-16 DIAGNOSIS — Z85828 Personal history of other malignant neoplasm of skin: Secondary | ICD-10-CM | POA: Diagnosis not present

## 2024-04-16 DIAGNOSIS — R197 Diarrhea, unspecified: Secondary | ICD-10-CM | POA: Diagnosis not present

## 2024-04-16 DIAGNOSIS — I10 Essential (primary) hypertension: Secondary | ICD-10-CM | POA: Diagnosis not present

## 2024-04-16 DIAGNOSIS — C9002 Multiple myeloma in relapse: Secondary | ICD-10-CM | POA: Diagnosis not present

## 2024-04-16 LAB — IMMUNOFIXATION ELECTROPHORESIS
IgA: 139 mg/dL (ref 64–422)
IgG (Immunoglobin G), Serum: 736 mg/dL (ref 586–1602)
IgM (Immunoglobulin M), Srm: 35 mg/dL (ref 26–217)
Total Protein ELP: 6.5 g/dL (ref 6.0–8.5)

## 2024-04-16 MED ORDER — ZOLEDRONIC ACID 4 MG/5ML IV CONC
3.0000 mg | Freq: Once | INTRAVENOUS | Status: AC
  Administered 2024-04-16: 3 mg via INTRAVENOUS
  Filled 2024-04-16: qty 3.75

## 2024-04-16 MED ORDER — SODIUM CHLORIDE 0.9 % IV SOLN
INTRAVENOUS | Status: DC
  Filled 2024-04-16: qty 250

## 2024-04-16 NOTE — Patient Instructions (Signed)

## 2024-04-16 NOTE — Progress Notes (Signed)
Patient not seen by CPP today

## 2024-04-16 NOTE — Progress Notes (Unsigned)
 [facility]  Telephone:([phone] [phone]  ID: Rhonda [name] Rex OB: 08-26-1933  MR#: [id number]  RDW#:[id number]  Patient Care Team: Liana Fish, NP as PCP - General (Nurse Practitioner) Darliss Rogue, MD as PCP - Cardiology (Cardiology) Jacobo Evalene PARAS, MD as Consulting Physician (Oncology)   CHIEF COMPLAINT:  Multiple myeloma in relapse.  INTERVAL HISTORY: Patient returns to clinic today for repeat laboratory work, further evaluation, and to assess her toleration of increased dose of Pomalyst .  She is tolerating treatment well without significant side effects.  She currently feels well and is asymptomatic. She has a chronic peripheral neuropathy, but no other neurologic complaints.  She denies any fevers. She has a good appetite and denies weight loss.  She denies any chest pain, shortness of breath, cough, or hemoptysis.  She denies any nausea, vomiting, constipation, or diarrhea.  She has no melena or hematochezia.  She has no urinary complaints.  Patient offers no further specific complaints today.  REVIEW OF SYSTEMS:   Review of Systems  Constitutional: Negative.  Negative for fever, malaise/fatigue and weight loss.  HENT:  Negative for congestion and sore throat.   Respiratory: Negative.  Negative for cough and shortness of breath.   Cardiovascular: Negative.  Negative for chest pain, palpitations and leg swelling.  Gastrointestinal: Negative.  Negative for abdominal pain, blood in stool, constipation, diarrhea, melena, nausea and vomiting.  Genitourinary: Negative.  Negative for dysuria.  Musculoskeletal: Negative.  Negative for back pain.  Skin: Negative.  Negative for rash.  Neurological:  Positive for tingling and sensory change. Negative for focal weakness and weakness.  Psychiatric/Behavioral: Negative.  The patient is not nervous/anxious and does not have insomnia.     As per HPI. Otherwise, a complete review of systems is  negative.  PAST MEDICAL HISTORY: Past Medical History:  Diagnosis Date  . Actinic keratosis   . Arthritis    hands  . Benign neoplasm of ascending colon   . Cataract   . GERD (gastroesophageal reflux disease)   . Hyperlipemia   . Hypertension 01/31/2015  . Hypothyroidism   . Melanoma (HCC) 04/28/2018   R mid to distal ant lat thigh. MM, SS, tumor thickness 0.66mm, antatomic level II. Excised: 05/13/18, margins free  . Multiple myeloma (HCC) 04/18/2016  . Multiple myeloma in remission (HCC) 04/18/2016  . Osteoporosis   . SCC (squamous cell carcinoma) 06/17/2023   R volar forearm, [facility]  . Shingles   . Skin cancer    basal cell  . Squamous cell carcinoma in situ (SCCIS) 02/06/2024   right medial knee, [facility]  . Squamous cell carcinoma of skin 02/04/2014   Left pretibial. KA-like pattern  . Squamous cell carcinoma of skin 09/15/2015   Right lat. superior ankle are. Superficial infiltration. Tx: [facility]  . Squamous cell carcinoma of skin 10/15/2016   Left medial mid lower leg. Tx: ED[facility]  . Squamous cell carcinoma of skin 11/27/2018   Right dorsum proximal forearm.  . Squamous cell carcinoma of skin 09/09/2019   Right cheek lat. to mid nasolabial area. Tx: ED[facility]  . Squamous cell carcinoma of skin 12/09/2019   right pretibial below knee  . Squamous cell carcinoma of skin 06/25/2022   Left ant forearm - [facility]    PAST SURGICAL HISTORY: Past Surgical History:  Procedure Laterality Date  . BREAST EXCISIONAL BIOPSY Right 15+ yrs ago   EXCISIONAL - NEG  . CATARACT EXTRACTION W/ INTRAOCULAR LENS IMPLANT    . CHOLECYSTECTOMY N/A 03/14/2017  Procedure: LAPAROSCOPIC CHOLECYSTECTOMY;  Surgeon: Wonda Charlie BRAVO, MD;  Location: [facility] ORS;  Service: General;  Laterality: N/A;  . COLONOSCOPY WITH PROPOFOL  N/A 02/21/2015   Procedure: COLONOSCOPY WITH PROPOFOL ;  Surgeon: Rogelia Copping, MD;  Location: [facility];  Service: Endoscopy;  Laterality: N/A;  . LAPAROSCOPIC HYSTERECTOMY  1980  .  POLYPECTOMY  02/21/2015   Procedure: POLYPECTOMY;  Surgeon: Rogelia Copping, MD;  Location: [facility];  Service: Endoscopy;;    FAMILY HISTORY: Family History  Problem Relation Age of Onset  . Heart disease Mother   . Stroke Father   . Heart disease Brother   . Kidney cancer Neg Hx   . Prostate cancer Neg Hx   . Bladder Cancer Neg Hx   . Breast cancer Neg Hx     ADVANCED DIRECTIVES (Y/N):  N  HEALTH MAINTENANCE: Social History   Tobacco Use  . Smoking status: Never  . Smokeless tobacco: Never  Vaping Use  . Vaping status: Never Used  Substance Use Topics  . Alcohol use: No    Alcohol/week: 0.0 standard drinks of alcohol  . Drug use: No     Colonoscopy:  PAP:  Bone density:  Lipid panel:  Allergies  Allergen Reactions  . Revlimid  [Lenalidomide ]     Current Outpatient Medications  Medication Sig Dispense Refill  . aspirin EC 81 MG tablet Take 81 mg by mouth daily.    . Cyanocobalamin (B-12 PO) Take 500 mcg by mouth daily.     . fexofenadine (ALLEGRA) 180 MG tablet Take 180 mg by mouth daily.    . levothyroxine  (SYNTHROID ) 50 MCG tablet TAKE 1 TABLET BY MOUTH DAILY BEFORE BREAKFAST 90 tablet 3  . Magnesium 400 MG TABS as directed Orally    . Multiple Vitamins-Minerals (CENTRUM SILVER PO) Take 1 tablet by mouth daily.    . POMALYST  4 MG capsule TAKE 1 CAPSULE BY MOUTH EVERY DAY FOR 21 DAYS THEN HOLD FOR 7 DAYS, REPEAT EVERY 28 DAYS 21 capsule 0  . triamcinolone  (NASACORT ) 55 MCG/ACT AERO nasal inhaler Place 2 sprays into the nose 2 (two) times daily as needed.    . EPINEPHrine  0.3 mg/0.3 mL IJ SOAJ injection Inject 0.3 mg into the muscle as needed for anaphylaxis.     No current facility-administered medications for this visit.    OBJECTIVE: Vitals:   04/16/24 1353  BP: (!) 124/57  Pulse: 67  Resp: 18  Temp: 98 F (36.7 C)  SpO2: 98%     Body mass index is 24.16 kg/m.    ECOG FS:0 - Asymptomatic  General: Well-developed, well-nourished, no acute  distress. Eyes: Pink conjunctiva, anicteric sclera. HEENT: Normocephalic, moist mucous membranes. Lungs: No audible wheezing or coughing. Heart: Regular rate and rhythm. Abdomen: Soft, nontender, no obvious distention. Musculoskeletal: No edema, cyanosis, or clubbing. Neuro: Alert, answering all questions appropriately. Cranial nerves grossly intact. Skin: No rashes or petechiae noted. Psych: Normal affect.  LAB RESULTS:  Lab Results  Component Value Date   NA 131 (L) 04/13/2024   K 4.0 04/13/2024   CL 100 04/13/2024   CO2 21 (L) 04/13/2024   GLUCOSE 105 (H) 04/13/2024   BUN 22 04/13/2024   CREATININE 0.92 04/13/2024   CALCIUM 9.0 04/13/2024   PROT 7.0 04/13/2024   ALBUMIN 4.0 04/13/2024   AST 19 04/13/2024   ALT 16 04/13/2024   ALKPHOS 61 04/13/2024   BILITOT 1.0 04/13/2024   GFRNONAA 60 (L) 04/13/2024   GFRAA 40 (L) 03/01/2020  Lab Results  Component Value Date   WBC 4.1 04/13/2024   NEUTROABS 1.2 (L) 04/13/2024   HGB 13.1 04/13/2024   HCT 38.6 04/13/2024   MCV 96.0 04/13/2024   PLT 278 04/13/2024   Lab Results  Component Value Date   TOTALPROTELP 5.9 (L) 03/16/2024   ALBUMINELP 3.5 05/16/2021   A1GS 0.2 05/16/2021   A2GS 0.6 05/16/2021   BETS 0.8 05/16/2021   GAMS 0.5 05/16/2021   MSPIKE Not Observed 05/16/2021   SPEI Comment 05/16/2021     STUDIES: No results found.   ASSESSMENT:  Multiple myeloma in relapse.  PLAN:    Multiple myeloma in relapse: Bone marrow biopsy on April 09, 2016 revealed 44% plasma cells and normal cytogenetics. Given the results of her metastatic bone survey on March 12, 2016, her elevated lambda free chains, and hypercalcemia, patient fit the criteria for multiple myeloma and was initially treated with single agent Revlimid .  She could not tolerate Revlimid  and was switched to pomalidomide  2 mg daily for 21 days with a 7-day break.  Metastatic bone survey on February 12, 2019 revealed new small lesions in her calvarium.   Patient's most recent M spike is now 0.0 and her immunoglobulins remain decreased.  Lambda free light chains have been trending down since March 2021 when Pomalyst  was initiated from 5600 to 345.6.  More recently, her lambda light chains have trended up and are now 665.9.  After discussion with the patient, she agreed to increased her Pomalyst  dose to 4 mg every 21 days with 7 days off.  Repeat lambda free light chains from today are pending at time of dictation.  No further intervention is needed.  Return to clinic in 2 months for repeat laboratory work, further evaluation, and continuation of Zometa .  Appreciate clinical pharmacy input. Peripheral neuropathy: Chronic and unchanged.  Patient was previously given a referral to acupuncture which she said did not help much.  Hypocalcemia: Resolved.   Leukopenia: Mild, monitor.  Continue Pomalyst  as prescribed. Diarrhea: Resolved.  Continue follow-up with GI as indicated.   Patient expressed understanding and was in agreement with this plan. She also understands that She can call clinic at any time with any questions, concerns, or complaints.    Evalene JINNY Reusing, MD 04/16/24 2:20 PM

## 2024-04-17 ENCOUNTER — Encounter: Payer: Self-pay | Admitting: Oncology

## 2024-04-20 ENCOUNTER — Other Ambulatory Visit (HOSPITAL_COMMUNITY): Payer: Self-pay | Admitting: Student

## 2024-04-20 DIAGNOSIS — C9000 Multiple myeloma not having achieved remission: Secondary | ICD-10-CM

## 2024-04-21 ENCOUNTER — Other Ambulatory Visit: Payer: Self-pay | Admitting: Radiology

## 2024-04-21 NOTE — Progress Notes (Signed)
 Patient for IR Bone Marrow Biopsy on Wed 04/22/24, I called and spoke with the patient on the phone and gave pre-procedure instructions. Pt was made aware to be here at 7:30a, NPO after MN prior to procedure as well as driver post procedure/recovery/discharge. Pt stated understanding.  Called 04/21/24

## 2024-04-21 NOTE — H&P (Signed)
 Chief Complaint: Patient was seen in consultation today for multiple myeloma in relapse   Procedure: Bone Marrow Biopsy with Aspiration  Referring Physician(s): Finnegan,Timothy J  Supervising Physician: Jenna Hacker  Patient Status: [facility] - Out-pt  History of Present Illness: Rhonda [name] is a 89 y.o. female with a history of skin cancer and multiple myeloma originally diagnosed in 2017, previously in remission. She has been following with Dr. Jacobo for treatment and maintenance therapy for the past several years. In July, she was noted to have a slight increase in her lamda light chains and they increased her Pomalyst  dose from 2mg  to 4mg  every 21 days with 7 days off. She has been continuing to have increasing levels of lambda light chains despite the increased dose of Pomalyst . At her last follow up on 11/6, her levels were 958.7 and the decision was made to hold her Pomalyst  and repeat bone marrow biopsy with tentative plan to reinitiate treatment with a single agent following biopsy results.  She presents today for her bone marrow biopsy with her daughter. States that she has been feeling slightly more fatigued since stopping her Pomalyst . Also admits to intermittent night sweats, but is otherwise doing well. She denies any fevers/chills, weakness, dizziness, nausea/vomiting, abdominal pain, chest pain, or shortness of breath. NPO since midnight. All questions and concerns answered at the bedside.   Code Status: Full Code  Past Medical History:  Diagnosis Date   Actinic keratosis    Arthritis    hands   Benign neoplasm of ascending colon    Cataract    GERD (gastroesophageal reflux disease)    Hyperlipemia    Hypertension 01/31/2015   Hypothyroidism    Melanoma (HCC) 04/28/2018   R mid to distal ant lat thigh. MM, SS, tumor thickness 0.6mm, antatomic level II. Excised: 05/13/18, margins free   Multiple myeloma (HCC) 04/18/2016   Multiple myeloma in remission  (HCC) 04/18/2016   Osteoporosis    SCC (squamous cell carcinoma) 06/17/2023   R volar forearm, [facility]   Shingles    Skin cancer    basal cell   Squamous cell carcinoma in situ (SCCIS) 02/06/2024   right medial knee, [facility]   Squamous cell carcinoma of skin 02/04/2014   Left pretibial. KA-like pattern   Squamous cell carcinoma of skin 09/15/2015   Right lat. superior ankle are. Superficial infiltration. Tx: [facility]   Squamous cell carcinoma of skin 10/15/2016   Left medial mid lower leg. Tx: [facility]   Squamous cell carcinoma of skin 11/27/2018   Right dorsum proximal forearm.   Squamous cell carcinoma of skin 09/09/2019   Right cheek lat. to mid nasolabial area. Tx: [facility]   Squamous cell carcinoma of skin 12/09/2019   right pretibial below knee   Squamous cell carcinoma of skin 06/25/2022   Left ant forearm - [facility]    Past Surgical History:  Procedure Laterality Date   BREAST EXCISIONAL BIOPSY Right 15+ yrs ago   EXCISIONAL - NEG   CATARACT EXTRACTION W/ INTRAOCULAR LENS IMPLANT     CHOLECYSTECTOMY N/A 03/14/2017   Procedure: LAPAROSCOPIC CHOLECYSTECTOMY;  Surgeon: Wonda Charlie BRAVO, MD;  Location: [facility];  Service: General;  Laterality: N/A;   COLONOSCOPY WITH PROPOFOL  N/A 02/21/2015   Procedure: COLONOSCOPY WITH PROPOFOL ;  Surgeon: Rogelia Copping, MD;  Location: [facility];  Service: Endoscopy;  Laterality: N/A;   LAPAROSCOPIC HYSTERECTOMY  1980   POLYPECTOMY  02/21/2015   Procedure: POLYPECTOMY;  Surgeon: Rogelia Copping, MD;  Location: [facility];  Service: Endoscopy;;    Allergies: Revlimid  [lenalidomide ]  Medications: Prior to Admission medications   Medication Sig Start Date End Date Taking? Authorizing Provider  aspirin EC 81 MG tablet Take 81 mg by mouth daily.    [provider]  Cyanocobalamin (B-12 PO) Take 500 mcg by mouth daily.     [provider]  EPINEPHrine  0.3 mg/0.3 mL IJ SOAJ injection Inject 0.3 mg into the muscle as needed for  anaphylaxis. 08/19/17   [provider]  fexofenadine (ALLEGRA) 180 MG tablet Take 180 mg by mouth daily.    [provider]  levothyroxine  (SYNTHROID ) 50 MCG tablet TAKE 1 TABLET BY MOUTH DAILY BEFORE BREAKFAST 03/26/23   Abernathy, Alyssa, NP  Magnesium 400 MG TABS as directed Orally    [provider]  Multiple Vitamins-Minerals (CENTRUM SILVER PO) Take 1 tablet by mouth daily.    [provider]  POMALYST  4 MG capsule TAKE 1 CAPSULE BY MOUTH EVERY DAY FOR 21 DAYS THEN HOLD FOR 7 DAYS, REPEAT EVERY 28 DAYS 03/30/24   Jacobo Evalene PARAS, MD  triamcinolone  (NASACORT ) 55 MCG/ACT AERO nasal inhaler Place 2 sprays into the nose 2 (two) times daily as needed.    [provider]     Family History  Problem Relation Age of Onset   Heart disease Mother    Stroke Father    Heart disease Brother    Kidney cancer Neg Hx    Prostate cancer Neg Hx    Bladder Cancer Neg Hx    Breast cancer Neg Hx     Social History   Socioeconomic History   Marital status: Widowed    Spouse name: Not on file   Number of children: Not on file   Years of education: Not on file   Highest education level: Not on file  Occupational History   Not on file  Tobacco Use   Smoking status: Never   Smokeless tobacco: Never  Vaping Use   Vaping status: Never Used  Substance and Sexual Activity   Alcohol use: No    Alcohol/week: 0.0 standard drinks of alcohol   Drug use: No   Sexual activity: Not on file  Other Topics Concern   Not on file  Social History Narrative   Not on file   Social Drivers of Health   Financial Resource Strain: Low Risk  (03/26/2023)   Overall Financial Resource Strain (CARDIA)    Difficulty of Paying Living Expenses: Not hard at all  Food Insecurity: No Food Insecurity (03/26/2023)   Hunger Vital Sign    Worried About Running Out of Food in the Last Year: Never true    Ran Out of Food in the Last Year: Never true  Transportation Needs:  No Transportation Needs (03/26/2023)   PRAPARE - Administrator, Civil Service (Medical): No    Lack of Transportation (Non-Medical): No  Physical Activity: Insufficiently Active (03/26/2023)   Exercise Vital Sign    Days of Exercise per Week: 2 days    Minutes of Exercise per Session: 60 min  Stress: Stress Concern Present (03/26/2023)   [employer] of Occupational Health - Occupational Stress Questionnaire    Feeling of Stress : To some extent  Social Connections: Moderately Isolated (03/26/2023)   Social Connection and Isolation Panel    Frequency of Communication with Friends and Family: More than three times a week    Frequency of Social Gatherings with Friends and Family: Once a  week    Attends Religious Services: More than 4 times per year    Active Member of Clubs or Organizations: No    Attends Banker Meetings: Never    Marital Status: Widowed    Review of Systems  Constitutional:  Positive for fatigue.  Denies any N/V, chest pain, shortness of breath, fevers/chills. All other ROS negative.  Vital Signs: BP (!) 146/65   Pulse 73   Temp 98.3 F (36.8 C) (Temporal)   Resp 18   Wt 119 lb 12.8 oz (54.3 kg)   SpO2 97%   BMI 23.40 kg/m    Physical Exam Vitals reviewed.  Constitutional:      Appearance: Normal appearance.  HENT:     Mouth/Throat:     Mouth: Mucous membranes are moist.     Pharynx: Oropharynx is clear.  Cardiovascular:     Rate and Rhythm: Normal rate and regular rhythm.     Heart sounds: Normal heart sounds.  Pulmonary:     Effort: Pulmonary effort is normal.     Breath sounds: Normal breath sounds.  Abdominal:     General: Abdomen is flat.     Palpations: Abdomen is soft.     Tenderness: There is no abdominal tenderness.  Skin:    General: Skin is warm and dry.  Neurological:     Mental Status: She is alert and oriented to person, place, and time.  Psychiatric:        Behavior: Behavior normal.      Imaging: No results found.  Labs:  CBC: Recent Labs    12/26/23 1035 02/13/24 1419 03/16/24 1310 04/13/24 1249  WBC 3.4* 3.0* 3.4* 4.1  HGB 12.1 12.7 12.5 13.1  HCT 36.9 37.7 36.2 38.6  PLT 209 203 205 278    COAGS: No results for input(s): INR, APTT in the last 8760 hours.  BMP: Recent Labs    12/26/23 1035 02/13/24 1419 03/16/24 1310 04/13/24 1249  NA 136 134* 138 131*  K 4.2 3.9 3.8 4.0  CL 104 105 108 100  CO2 24 21* 24 21*  GLUCOSE 87 102* 91 105*  BUN 16 17 23 22   CALCIUM 9.1 8.9 9.1 9.0  CREATININE 0.97 0.90 0.98 0.92  GFRNONAA 56* >60 56* 60*    LIVER FUNCTION TESTS: Recent Labs    12/26/23 1035 02/13/24 1419 03/16/24 1310 04/13/24 1249  BILITOT 0.7 0.6 0.7 1.0  AST 18 17 16 19   ALT 18 18 14 16   ALKPHOS 55 61 55 61  PROT 6.3* 6.6 6.4* 7.0  ALBUMIN 3.9 4.0 3.9 4.0    TUMOR MARKERS: No results for input(s): AFPTM, CEA, CA199, CHROMGRNA in the last 8760 hours.  Assessment and Plan:  Multiple Myeloma in Remission: Rhonda [name] is a 89 y.o. female with a history of skin cancer and multiple myeloma, previously in remission. Recently, patient noted to have increasing levels of lambda light chains, despite medication adjustments. She was subsequently referred to IR for bone marrow biopsy with aspiration to assess for return of myeloma. Procedure to be performed under moderate sedation.   Risks and benefits of bone marrow biopsy was discussed with the patient and/or patient's family including, but not limited to bleeding, infection, damage to adjacent structures or low yield requiring additional tests.  All of the questions were answered and there is agreement to proceed.  Consent signed and in chart.'  Thank you for this interesting consult. I greatly enjoyed meeting Gustav MALVA Rex and  look forward to participating in their care. A copy of this report was sent to the requesting provider on this date.  Electronically  Signed: Glennon CHRISTELLA Bal, PA-C 04/22/2024, 8:17 AM   I spent a total of 30 Minutes in face to face clinical consultation, greater than 50% of which was counseling/coordinating care for bone marrow biopsy with aspiration.

## 2024-04-22 ENCOUNTER — Other Ambulatory Visit: Payer: Self-pay | Admitting: Oncology

## 2024-04-22 ENCOUNTER — Encounter: Payer: Self-pay | Admitting: Radiology

## 2024-04-22 ENCOUNTER — Other Ambulatory Visit: Payer: Self-pay

## 2024-04-22 ENCOUNTER — Ambulatory Visit
Admission: RE | Admit: 2024-04-22 | Discharge: 2024-04-22 | Disposition: A | Discharge status: Home / Self Care | Source: Ambulatory Visit | Attending: Oncology | Admitting: Oncology

## 2024-04-22 DIAGNOSIS — Z1379 Encounter for other screening for genetic and chromosomal anomalies: Secondary | ICD-10-CM | POA: Insufficient documentation

## 2024-04-22 DIAGNOSIS — Z85828 Personal history of other malignant neoplasm of skin: Secondary | ICD-10-CM | POA: Diagnosis not present

## 2024-04-22 DIAGNOSIS — C9000 Multiple myeloma not having achieved remission: Secondary | ICD-10-CM

## 2024-04-22 DIAGNOSIS — C9002 Multiple myeloma in relapse: Secondary | ICD-10-CM | POA: Insufficient documentation

## 2024-04-22 DIAGNOSIS — D4989 Neoplasm of unspecified behavior of other specified sites: Secondary | ICD-10-CM | POA: Diagnosis not present

## 2024-04-22 DIAGNOSIS — D72821 Monocytosis (symptomatic): Secondary | ICD-10-CM | POA: Diagnosis not present

## 2024-04-22 HISTORY — PX: IR BONE MARROW BIOPSY & ASPIRATION: IMG5727

## 2024-04-22 LAB — CBC WITH DIFFERENTIAL/PLATELET
Abs Immature Granulocytes: 0.09 K/uL — ABNORMAL HIGH (ref 0.00–0.07)
Basophils Absolute: 0.2 K/uL — ABNORMAL HIGH (ref 0.0–0.1)
Basophils Relative: 2 %
Eosinophils Absolute: 0.1 K/uL (ref 0.0–0.5)
Eosinophils Relative: 1 %
HCT: 42.3 % (ref 36.0–46.0)
Hemoglobin: 13.9 g/dL (ref 12.0–15.0)
Immature Granulocytes: 1 %
Lymphocytes Relative: 16 %
Lymphs Abs: 1.6 K/uL (ref 0.7–4.0)
MCH: 32.8 pg (ref 26.0–34.0)
MCHC: 32.9 g/dL (ref 30.0–36.0)
MCV: 99.8 fL (ref 80.0–100.0)
Monocytes Absolute: 1.2 K/uL — ABNORMAL HIGH (ref 0.1–1.0)
Monocytes Relative: 12 %
Neutro Abs: 7 K/uL (ref 1.7–7.7)
Neutrophils Relative %: 68 %
Platelets: 264 K/uL (ref 150–400)
RBC: 4.24 MIL/uL (ref 3.87–5.11)
RDW: 14.4 % (ref 11.5–15.5)
WBC: 10.2 K/uL (ref 4.0–10.5)
nRBC: 0 % (ref 0.0–0.2)

## 2024-04-22 MED ORDER — HEPARIN SOD (PORK) LOCK FLUSH 100 UNIT/ML IV SOLN
INTRAVENOUS | Status: AC
  Filled 2024-04-22: qty 5

## 2024-04-22 MED ORDER — FENTANYL CITRATE (PF) 100 MCG/2ML IJ SOLN
INTRAMUSCULAR | Status: AC
  Filled 2024-04-22: qty 2

## 2024-04-22 MED ORDER — FENTANYL CITRATE (PF) 100 MCG/2ML IJ SOLN
INTRAMUSCULAR | Status: AC | PRN
  Administered 2024-04-22: 25 ug via INTRAVENOUS
  Administered 2024-04-22: 50 ug via INTRAVENOUS

## 2024-04-22 MED ORDER — LIDOCAINE 1 % OPTIME INJ - NO CHARGE
10.0000 mL | Freq: Once | INTRAMUSCULAR | Status: AC
  Administered 2024-04-22: 10 mL via INTRADERMAL
  Filled 2024-04-22: qty 10

## 2024-04-22 MED ORDER — SODIUM CHLORIDE 0.9 % IV SOLN: INTRAVENOUS | Status: DC

## 2024-04-22 MED ORDER — MIDAZOLAM HCL 2 MG/2ML IJ SOLN
INTRAMUSCULAR | Status: AC
  Filled 2024-04-22: qty 2

## 2024-04-22 MED ORDER — MIDAZOLAM HCL (PF) 2 MG/2ML IJ SOLN
INTRAMUSCULAR | Status: AC | PRN
  Administered 2024-04-22: .5 mg via INTRAVENOUS
  Administered 2024-04-22: 1 mg via INTRAVENOUS

## 2024-04-22 NOTE — Procedures (Signed)
 Interventional Radiology Procedure Note  Procedure: Fluoro Guided Biopsy of bone marrow  Complications: None  Estimated Blood Loss: < 10 mL  Findings: 13 G core biopsy of left iliac bone performed under fluoro guidance.  Aspirate core samples obtained and sent to Pathology.  Rhonda [name] Banner, MD

## 2024-04-24 LAB — SURGICAL PATHOLOGY

## 2024-04-29 ENCOUNTER — Telehealth: Payer: Self-pay | Admitting: Oncology

## 2024-04-29 ENCOUNTER — Inpatient Hospital Stay: Admitting: Oncology

## 2024-04-29 ENCOUNTER — Encounter: Payer: Self-pay | Admitting: Oncology

## 2024-04-29 VITALS — BP 118/70 | HR 79 | Temp 98.5°F | Resp 18 | Ht 61.0 in | Wt 123.0 lb

## 2024-04-29 DIAGNOSIS — C9002 Multiple myeloma in relapse: Secondary | ICD-10-CM | POA: Diagnosis not present

## 2024-04-29 DIAGNOSIS — C9000 Multiple myeloma not having achieved remission: Secondary | ICD-10-CM | POA: Diagnosis not present

## 2024-04-29 DIAGNOSIS — E871 Hypo-osmolality and hyponatremia: Secondary | ICD-10-CM | POA: Diagnosis not present

## 2024-04-29 DIAGNOSIS — G629 Polyneuropathy, unspecified: Secondary | ICD-10-CM | POA: Diagnosis not present

## 2024-04-29 DIAGNOSIS — Z85828 Personal history of other malignant neoplasm of skin: Secondary | ICD-10-CM | POA: Diagnosis not present

## 2024-04-29 DIAGNOSIS — R197 Diarrhea, unspecified: Secondary | ICD-10-CM | POA: Diagnosis not present

## 2024-04-29 DIAGNOSIS — I10 Essential (primary) hypertension: Secondary | ICD-10-CM | POA: Diagnosis not present

## 2024-04-29 DIAGNOSIS — M810 Age-related osteoporosis without current pathological fracture: Secondary | ICD-10-CM | POA: Diagnosis not present

## 2024-04-29 NOTE — Telephone Encounter (Signed)
 I called the pt to get her scheduled for her chemo class by the end of the year, no answer so I left a vm for the pt to return my call.

## 2024-04-29 NOTE — Progress Notes (Signed)
 Starting on Sunday you started taking imodium for her diarrhea.

## 2024-04-29 NOTE — Progress Notes (Signed)
 [facility]  [phone] [phone]  ID: Rhonda [name] OB: 10-21-1933  MR#: [id number]  RDW#:[id number]  Patient Care Team: Liana Fish, NP as PCP - General (Nurse Practitioner) Darliss Rogue, MD as PCP - Cardiology (Cardiology) Jacobo Evalene PARAS, MD as Consulting Physician (Oncology)   CHIEF COMPLAINT:  Multiple myeloma in relapse.  INTERVAL HISTORY: Patient returns to clinic today for further evaluation, discussion of her bone marrow biopsy results, and treatment planning.  She continues to feel well and remains asymptomatic.  She has a chronic peripheral neuropathy, but no other neurologic complaints.  She denies any fevers. She has a good appetite and denies weight loss.  She denies any chest pain, shortness of breath, cough, or hemoptysis.  She denies any nausea, vomiting, constipation, or diarrhea.  She has no melena or hematochezia.  She has no urinary complaints.  Patient offers no further specific complaints today.  REVIEW OF SYSTEMS:   Review of Systems  Constitutional: Negative.  Negative for fever, malaise/fatigue and weight loss.  HENT:  Negative for congestion and sore throat.   Respiratory: Negative.  Negative for cough and shortness of breath.   Cardiovascular: Negative.  Negative for chest pain, palpitations and leg swelling.  Gastrointestinal: Negative.  Negative for abdominal pain, blood in stool, constipation, diarrhea, melena, nausea and vomiting.  Genitourinary: Negative.  Negative for dysuria.  Musculoskeletal: Negative.  Negative for back pain.  Skin: Negative.  Negative for rash.  Neurological:  Positive for tingling and sensory change. Negative for focal weakness and weakness.  Psychiatric/Behavioral: Negative.  The patient is not nervous/anxious and does not have insomnia.     As per HPI. Otherwise, a complete review of systems is negative.  PAST MEDICAL HISTORY: Past Medical History:  Diagnosis Date    Actinic keratosis    Arthritis    hands   Benign neoplasm of ascending colon    Cataract    GERD (gastroesophageal reflux disease)    Hyperlipemia    Hypertension 01/31/2015   Hypothyroidism    Melanoma (HCC) 04/28/2018   R mid to distal ant lat thigh. MM, SS, tumor thickness 0.33mm, antatomic level II. Excised: 05/13/18, margins free   Multiple myeloma (HCC) 04/18/2016   Multiple myeloma in remission (HCC) 04/18/2016   Osteoporosis    SCC (squamous cell carcinoma) 06/17/2023   R volar forearm, [facility]   Shingles    Skin cancer    basal cell   Squamous cell carcinoma in situ (SCCIS) 02/06/2024   right medial knee, [facility]   Squamous cell carcinoma of skin 02/04/2014   Left pretibial. KA-like pattern   Squamous cell carcinoma of skin 09/15/2015   Right lat. superior ankle are. Superficial infiltration. Tx: [facility]   Squamous cell carcinoma of skin 10/15/2016   Left medial mid lower leg. Tx: [facility]   Squamous cell carcinoma of skin 11/27/2018   Right dorsum proximal forearm.   Squamous cell carcinoma of skin 09/09/2019   Right cheek lat. to mid nasolabial area. Tx: ED[facility]   Squamous cell carcinoma of skin 12/09/2019   right pretibial below knee   Squamous cell carcinoma of skin 06/25/2022   Left ant forearm - [facility]    PAST SURGICAL HISTORY: Past Surgical History:  Procedure Laterality Date   BREAST EXCISIONAL BIOPSY Right 15+ yrs ago   EXCISIONAL - NEG   CATARACT EXTRACTION W/ INTRAOCULAR LENS IMPLANT     CHOLECYSTECTOMY N/A 03/14/2017   Procedure: LAPAROSCOPIC CHOLECYSTECTOMY;  Surgeon: Wonda Charlie BRAVO, MD;  Location:  [facility];  Service: General;  Laterality: N/A;   COLONOSCOPY WITH PROPOFOL  N/A 02/21/2015   Procedure: COLONOSCOPY WITH PROPOFOL ;  Surgeon: Rogelia Copping, MD;  Location: [facility];  Service: Endoscopy;  Laterality: N/A;   IR BONE MARROW BIOPSY & ASPIRATION  04/22/2024   LAPAROSCOPIC HYSTERECTOMY  1980   POLYPECTOMY  02/21/2015   Procedure: POLYPECTOMY;  Surgeon:  Rogelia Copping, MD;  Location: [facility];  Service: Endoscopy;;    FAMILY HISTORY: Family History  Problem Relation Age of Onset   Heart disease Mother    Stroke Father    Heart disease Brother    Kidney cancer Neg Hx    Prostate cancer Neg Hx    Bladder Cancer Neg Hx    Breast cancer Neg Hx     ADVANCED DIRECTIVES (Y/N):  N  HEALTH MAINTENANCE: Social History   Tobacco Use   Smoking status: Never   Smokeless tobacco: Never  Vaping Use   Vaping status: Never Used  Substance Use Topics   Alcohol use: No    Alcohol/week: 0.0 standard drinks of alcohol   Drug use: No     Colonoscopy:  PAP:  Bone density:  Lipid panel:  Allergies  Allergen Reactions   Revlimid  [Lenalidomide ]     Current Outpatient Medications  Medication Sig Dispense Refill   aspirin EC 81 MG tablet Take 81 mg by mouth daily.     Cyanocobalamin (B-12 PO) Take 500 mcg by mouth daily.      fexofenadine (ALLEGRA) 180 MG tablet Take 180 mg by mouth daily.     levothyroxine  (SYNTHROID ) 50 MCG tablet TAKE 1 TABLET BY MOUTH DAILY BEFORE BREAKFAST 90 tablet 3   Magnesium 400 MG TABS as directed Orally     Multiple Vitamins-Minerals (CENTRUM SILVER PO) Take 1 tablet by mouth daily.     triamcinolone  (NASACORT ) 55 MCG/ACT AERO nasal inhaler Place 2 sprays into the nose 2 (two) times daily as needed.     acyclovir  (ZOVIRAX ) 400 MG tablet Take 1 tablet (400 mg total) by mouth 2 (two) times daily. 60 tablet 11   POMALYST  4 MG capsule TAKE 1 CAPSULE BY MOUTH EVERY DAY FOR 21 DAYS THEN HOLD FOR 7 DAYS, REPEAT EVERY 28 DAYS (Patient not taking: Reported on 04/29/2024) 21 capsule 0   No current facility-administered medications for this visit.    OBJECTIVE: Vitals:   04/29/24 1435  BP: 118/70  Pulse: 79  Resp: 18  Temp: 98.5 F (36.9 C)  SpO2: 97%     Body mass index is 23.24 kg/m.    ECOG FS:0 - Asymptomatic  General: Well-developed, well-nourished, no acute distress. Eyes: Pink conjunctiva,  anicteric sclera. HEENT: Normocephalic, moist mucous membranes. Lungs: No audible wheezing or coughing. Heart: Regular rate and rhythm. Abdomen: Soft, nontender, no obvious distention. Musculoskeletal: No edema, cyanosis, or clubbing. Neuro: Alert, answering all questions appropriately. Cranial nerves grossly intact. Skin: No rashes or petechiae noted. Psych: Normal affect.  LAB RESULTS:  Lab Results  Component Value Date   NA 131 (L) 04/13/2024   K 4.0 04/13/2024   CL 100 04/13/2024   CO2 21 (L) 04/13/2024   GLUCOSE 105 (H) 04/13/2024   BUN 22 04/13/2024   CREATININE 0.92 04/13/2024   CALCIUM 9.0 04/13/2024   PROT 7.0 04/13/2024   ALBUMIN 4.0 04/13/2024   AST 19 04/13/2024   ALT 16 04/13/2024   ALKPHOS 61 04/13/2024   BILITOT 1.0 04/13/2024   GFRNONAA 60 (L) 04/13/2024   GFRAA 40 (  L) 03/01/2020    Lab Results  Component Value Date   WBC 10.2 04/22/2024   NEUTROABS 7.0 04/22/2024   HGB 13.9 04/22/2024   HCT 42.3 04/22/2024   MCV 99.8 04/22/2024   PLT 264 04/22/2024   Lab Results  Component Value Date   TOTALPROTELP 6.5 04/13/2024   ALBUMINELP 3.5 05/16/2021   A1GS 0.2 05/16/2021   A2GS 0.6 05/16/2021   BETS 0.8 05/16/2021   GAMS 0.5 05/16/2021   MSPIKE Not Observed 05/16/2021   SPEI Comment 05/16/2021     STUDIES: IR BONE MARROW BIOPSY & ASPIRATION Result Date: 04/22/2024 CLINICAL DATA:  Multiple myeloma in relapse. EXAM: Fluoro guided bone marrow biopsy TECHNIQUE: Fluoro pelvis CONTRAST:  None RADIOPHARMACEUTICALS:  None FLUOROSCOPY: 5 mGy COMPARISON:  None FINDINGS: The patient was placed in prone position on the IR table. Radiopaque markers were placed on the patient's skin and initial imaging of the pelvis was performed. The patient's skin was then prepped and draped in the usual sterile fashion. Moderate sedation was provided for by the nursing staff under my supervision utilizing intravenous Versed  and fentanyl . The nurse had no other duties other than  monitoring the patient and providing sedation during the procedure. I was present for the entire procedure. 1.5 mg intravenous Versed  and 75 mcg intravenous fentanyl  were administered for a total sedation time of 10 minutes 1% lidocaine  was used to infiltrate the skin at the access site prior to a stab incision. Local anesthesia was then used to infiltrate the region of soft tissue from the skin to the left iliac bone. The bone marrow needle was then advanced and imaging demonstrated the needle tip to be in the cortex of the left iliac bone. The bone was then penetrated and a sample was obtained. After the sample was evaluated, approximately 5 mL of heparinized bone marrow sample was obtained by aspiration. A core sample was then obtained. Multiple attempts at sampling was performed in order to get 2 1 cm segments. All needles were then removed from the patient. Sterile dressing was applied. IMPRESSION: Satisfactory core needle biopsy and aspiration of the left iliac bone marrow under fluoro guidance. Electronically Signed   By: Cordella Banner   On: 04/22/2024 10:40     ASSESSMENT:  Multiple myeloma in relapse.  PLAN:    Multiple myeloma in relapse: Bone marrow biopsy on April 09, 2016 revealed 44% plasma cells and normal cytogenetics. Given the results of her metastatic bone survey on March 12, 2016, her elevated lambda free chains, and hypercalcemia, patient fit the criteria for multiple myeloma and was initially treated with single agent Revlimid .  She could not tolerate Revlimid  and was switched to pomalidomide  2 mg daily for 21 days with a 7-day break.  Repeat bone marrow biopsy on April 22, 2024 revealed persistent disease with 5% plasma cells.  M spike and immunoglobulins continue to remain within normal limits.  Her lambda free light chains continued to trend up and her most recent result is 958.7 likely indicating progressive disease.  Patient has been instructed to complete her current  Pomalyst  dose and then discontinue.  She would then return to clinic in January 2026 to initiate subcutaneous daratumumab.  Patient also received Zometa  at that time.  Appreciate clinical pharmacy input. Peripheral neuropathy: Chronic and unchanged.  Patient was previously given a referral to acupuncture which she said did not help much.  Hyponatremia: Mild.  Patient sodium levels 131. Diarrhea: Chronic and unchanged.  Patient reports GI has elected not  to repeat colonoscopy.  I spent a total of 30 minutes reviewing chart data, face-to-face evaluation with the patient, counseling and coordination of care as detailed above.   Patient expressed understanding and was in agreement with this plan. She also understands that She can call clinic at any time with any questions, concerns, or complaints.    Evalene JINNY Reusing, MD 05/01/24 7:20 AM

## 2024-04-30 ENCOUNTER — Other Ambulatory Visit: Payer: Self-pay

## 2024-05-01 ENCOUNTER — Encounter: Payer: Self-pay | Admitting: Oncology

## 2024-05-01 ENCOUNTER — Other Ambulatory Visit: Payer: Self-pay | Admitting: Oncology

## 2024-05-01 DIAGNOSIS — C9000 Multiple myeloma not having achieved remission: Secondary | ICD-10-CM

## 2024-05-01 MED ORDER — ACYCLOVIR 400 MG PO TABS: 400.0000 mg | ORAL_TABLET | Freq: Two times a day (BID) | ORAL | 11 refills | Status: DC

## 2024-05-04 ENCOUNTER — Encounter (HOSPITAL_COMMUNITY): Payer: Self-pay

## 2024-05-14 DIAGNOSIS — Z23 Encounter for immunization: Secondary | ICD-10-CM | POA: Diagnosis not present

## 2024-05-14 DIAGNOSIS — C9000 Multiple myeloma not having achieved remission: Secondary | ICD-10-CM | POA: Diagnosis not present

## 2024-05-14 DIAGNOSIS — E039 Hypothyroidism, unspecified: Secondary | ICD-10-CM | POA: Diagnosis not present

## 2024-05-15 ENCOUNTER — Telehealth: Payer: Self-pay

## 2024-05-15 NOTE — Telephone Encounter (Signed)
 Called and spoke with [facility] in regards to their call about wether or not this patient was still holding the pomalist medication. I told the lady on the phone that Dr. Jacobo has discontinued the medication all together.

## 2024-05-18 ENCOUNTER — Inpatient Hospital Stay: Attending: Oncology

## 2024-05-29 ENCOUNTER — Other Ambulatory Visit: Payer: Self-pay

## 2024-06-02 NOTE — Progress Notes (Signed)
 Pharmacist Chemotherapy Monitoring - Initial Assessment    Anticipated start date: 06/16/24   The following has been reviewed per standard work regarding the patient's treatment regimen: The patient's diagnosis, treatment plan and drug doses, and organ/hematologic function Lab orders and baseline tests specific to treatment regimen  The treatment plan start date, drug sequencing, and pre-medications Prior authorization status  Patient's documented medication list, including drug-drug interaction screen and prescriptions for anti-emetics and supportive care specific to the treatment regimen The drug concentrations, fluid compatibility, administration routes, and timing of the medications to be used The patient's access for treatment and lifetime cumulative dose history, if applicable  The patient's medication allergies and previous infusion related reactions, if applicable   Changes made to treatment plan:  N/A  Follow up needed:  Pre-treatment  type and screen/ RBC phenotype   Rhonda [name] Andreas, PharmD, BCPS Clinical Pharmacist   06/02/2024  3:28 PM

## 2024-06-16 ENCOUNTER — Inpatient Hospital Stay

## 2024-06-16 ENCOUNTER — Inpatient Hospital Stay: Attending: Oncology

## 2024-06-16 ENCOUNTER — Inpatient Hospital Stay (HOSPITAL_BASED_OUTPATIENT_CLINIC_OR_DEPARTMENT_OTHER): Admitting: Oncology

## 2024-06-16 ENCOUNTER — Encounter: Payer: Self-pay | Admitting: Oncology

## 2024-06-16 VITALS — BP 129/73 | HR 72 | Temp 99.0°F | Resp 18

## 2024-06-16 VITALS — BP 122/76 | HR 69 | Temp 98.7°F | Resp 18 | Ht 61.0 in | Wt 121.8 lb

## 2024-06-16 DIAGNOSIS — D696 Thrombocytopenia, unspecified: Secondary | ICD-10-CM | POA: Diagnosis not present

## 2024-06-16 DIAGNOSIS — I10 Essential (primary) hypertension: Secondary | ICD-10-CM | POA: Diagnosis not present

## 2024-06-16 DIAGNOSIS — D72819 Decreased white blood cell count, unspecified: Secondary | ICD-10-CM | POA: Insufficient documentation

## 2024-06-16 DIAGNOSIS — C9002 Multiple myeloma in relapse: Secondary | ICD-10-CM | POA: Diagnosis present

## 2024-06-16 DIAGNOSIS — R5383 Other fatigue: Secondary | ICD-10-CM | POA: Insufficient documentation

## 2024-06-16 DIAGNOSIS — G629 Polyneuropathy, unspecified: Secondary | ICD-10-CM | POA: Insufficient documentation

## 2024-06-16 DIAGNOSIS — M810 Age-related osteoporosis without current pathological fracture: Secondary | ICD-10-CM | POA: Diagnosis not present

## 2024-06-16 DIAGNOSIS — Z5112 Encounter for antineoplastic immunotherapy: Secondary | ICD-10-CM | POA: Insufficient documentation

## 2024-06-16 DIAGNOSIS — R300 Dysuria: Secondary | ICD-10-CM | POA: Insufficient documentation

## 2024-06-16 DIAGNOSIS — C9000 Multiple myeloma not having achieved remission: Secondary | ICD-10-CM

## 2024-06-16 DIAGNOSIS — R197 Diarrhea, unspecified: Secondary | ICD-10-CM | POA: Insufficient documentation

## 2024-06-16 DIAGNOSIS — Z85820 Personal history of malignant melanoma of skin: Secondary | ICD-10-CM | POA: Diagnosis not present

## 2024-06-16 DIAGNOSIS — G4700 Insomnia, unspecified: Secondary | ICD-10-CM | POA: Diagnosis not present

## 2024-06-16 LAB — CBC WITH DIFFERENTIAL (CANCER CENTER ONLY)
Abs Immature Granulocytes: 0.01 K/uL (ref 0.00–0.07)
Basophils Absolute: 0 K/uL (ref 0.0–0.1)
Basophils Relative: 1 %
Eosinophils Absolute: 0 K/uL (ref 0.0–0.5)
Eosinophils Relative: 0 %
HCT: 39.4 % (ref 36.0–46.0)
Hemoglobin: 13.1 g/dL (ref 12.0–15.0)
Immature Granulocytes: 0 %
Lymphocytes Relative: 24 %
Lymphs Abs: 1.1 K/uL (ref 0.7–4.0)
MCH: 32.8 pg (ref 26.0–34.0)
MCHC: 33.2 g/dL (ref 30.0–36.0)
MCV: 98.5 fL (ref 80.0–100.0)
Monocytes Absolute: 0.5 K/uL (ref 0.1–1.0)
Monocytes Relative: 12 %
Neutro Abs: 2.9 K/uL (ref 1.7–7.7)
Neutrophils Relative %: 63 %
Platelet Count: 164 K/uL (ref 150–400)
RBC: 4 MIL/uL (ref 3.87–5.11)
RDW: 12.7 % (ref 11.5–15.5)
WBC Count: 4.6 K/uL (ref 4.0–10.5)
nRBC: 0 % (ref 0.0–0.2)

## 2024-06-16 LAB — CMP (CANCER CENTER ONLY)
ALT: 20 U/L (ref 0–44)
AST: 22 U/L (ref 15–41)
Albumin: 4.1 g/dL (ref 3.5–5.0)
Alkaline Phosphatase: 73 U/L (ref 38–126)
Anion gap: 11 (ref 5–15)
BUN: 13 mg/dL (ref 8–23)
CO2: 24 mmol/L (ref 22–32)
Calcium: 8.9 mg/dL (ref 8.9–10.3)
Chloride: 104 mmol/L (ref 98–111)
Creatinine: 0.73 mg/dL (ref 0.44–1.00)
GFR, Estimated: >60 mL/min
Glucose, Bld: 107 mg/dL — ABNORMAL HIGH (ref 70–99)
Potassium: 3.7 mmol/L (ref 3.5–5.1)
Sodium: 139 mmol/L (ref 135–145)
Total Bilirubin: 0.4 mg/dL (ref 0.0–1.2)
Total Protein: 6.2 g/dL — ABNORMAL LOW (ref 6.5–8.1)

## 2024-06-16 LAB — TYPE AND SCREEN
ABO/RH(D): O NEG
Antibody Screen: NEGATIVE

## 2024-06-16 LAB — PRETREATMENT RBC PHENOTYPE

## 2024-06-16 MED ORDER — DEXAMETHASONE 4 MG PO TABS
20.0000 mg | ORAL_TABLET | Freq: Once | ORAL | Status: AC
  Administered 2024-06-16: 20 mg via ORAL
  Filled 2024-06-16: qty 5

## 2024-06-16 MED ORDER — ACETAMINOPHEN 325 MG PO TABS
650.0000 mg | ORAL_TABLET | Freq: Once | ORAL | Status: AC
  Administered 2024-06-16: 650 mg via ORAL
  Filled 2024-06-16: qty 2

## 2024-06-16 MED ORDER — MONTELUKAST SODIUM 10 MG PO TABS
10.0000 mg | ORAL_TABLET | Freq: Once | ORAL | Status: AC
  Administered 2024-06-16: 10 mg via ORAL
  Filled 2024-06-16: qty 1

## 2024-06-16 MED ORDER — DIPHENHYDRAMINE HCL 25 MG PO TABS
25.0000 mg | ORAL_TABLET | Freq: Once | ORAL | Status: AC
  Administered 2024-06-16: 25 mg via ORAL
  Filled 2024-06-16: qty 1

## 2024-06-16 MED ORDER — DARATUMUMAB-HYALURONIDASE-FIHJ 1800-30000 MG-UT/15ML ~~LOC~~ SOLN
1800.0000 mg | Freq: Once | SUBCUTANEOUS | Status: AC
  Administered 2024-06-16: 1800 mg via SUBCUTANEOUS
  Filled 2024-06-16: qty 15

## 2024-06-16 NOTE — Progress Notes (Signed)
 " [facility]  [phone] [phone]  ID: Rhonda [name] Rex OB: Nov 11, 1933  MR#: [id number]  RDW#:[id number]  Patient Care Team: Liana Fish, NP as PCP - General (Nurse Practitioner) Darliss Rogue, MD as PCP - Cardiology (Cardiology) Jacobo Evalene PARAS, MD as Consulting Physician (Oncology)   CHIEF COMPLAINT:  Multiple myeloma in relapse.  INTERVAL HISTORY: Patient returns to clinic today for further evaluation and initiation of cycle 1, day 1 of single agent daratumumab .  She continues to feel well and remains asymptomatic. She has a chronic peripheral neuropathy, but no other neurologic complaints.  She denies any recent fevers or illnesses.  She has a good appetite and denies weight loss.  She denies any chest pain, shortness of breath, cough, or hemoptysis.  She denies any nausea, vomiting, constipation, or diarrhea.  She has no melena or hematochezia.  She has no urinary complaints.  Patient offers no specific complaints today.  REVIEW OF SYSTEMS:   Review of Systems  Constitutional: Negative.  Negative for fever, malaise/fatigue and weight loss.  HENT:  Negative for congestion and sore throat.   Respiratory: Negative.  Negative for cough and shortness of breath.   Cardiovascular: Negative.  Negative for chest pain, palpitations and leg swelling.  Gastrointestinal: Negative.  Negative for abdominal pain, blood in stool, constipation, diarrhea, melena, nausea and vomiting.  Genitourinary: Negative.  Negative for dysuria.  Musculoskeletal: Negative.  Negative for back pain.  Skin: Negative.  Negative for rash.  Neurological:  Positive for tingling and sensory change. Negative for focal weakness and weakness.  Psychiatric/Behavioral: Negative.  The patient is not nervous/anxious and does not have insomnia.     As per HPI. Otherwise, a complete review of systems is negative.  PAST MEDICAL HISTORY: Past Medical History:  Diagnosis  Date   Actinic keratosis    Arthritis    hands   Benign neoplasm of ascending colon    Cataract    GERD (gastroesophageal reflux disease)    Hyperlipemia    Hypertension 01/31/2015   Hypothyroidism    Melanoma (HCC) 04/28/2018   R mid to distal ant lat thigh. MM, SS, tumor thickness 0.63mm, antatomic level II. Excised: 05/13/18, margins free   Multiple myeloma (HCC) 04/18/2016   Multiple myeloma in remission (HCC) 04/18/2016   Osteoporosis    SCC (squamous cell carcinoma) 06/17/2023   R volar forearm, [facility]   Shingles    Skin cancer    basal cell   Squamous cell carcinoma in situ (SCCIS) 02/06/2024   right medial knee, [facility]   Squamous cell carcinoma of skin 02/04/2014   Left pretibial. KA-like pattern   Squamous cell carcinoma of skin 09/15/2015   Right lat. superior ankle are. Superficial infiltration. Tx: [facility]   Squamous cell carcinoma of skin 10/15/2016   Left medial mid lower leg. Tx: [facility]   Squamous cell carcinoma of skin 11/27/2018   Right dorsum proximal forearm.   Squamous cell carcinoma of skin 09/09/2019   Right cheek lat. to mid nasolabial area. Tx: [facility]   Squamous cell carcinoma of skin 12/09/2019   right pretibial below knee   Squamous cell carcinoma of skin 06/25/2022   Left ant forearm - [facility]    PAST SURGICAL HISTORY: Past Surgical History:  Procedure Laterality Date   BREAST EXCISIONAL BIOPSY Right 15+ yrs ago   EXCISIONAL - NEG   CATARACT EXTRACTION W/ INTRAOCULAR LENS IMPLANT     CHOLECYSTECTOMY N/A 03/14/2017   Procedure: LAPAROSCOPIC CHOLECYSTECTOMY;  Surgeon: Wonda Ade  E, MD;  Location: [facility];  Service: General;  Laterality: N/A;   COLONOSCOPY WITH PROPOFOL  N/A 02/21/2015   Procedure: COLONOSCOPY WITH PROPOFOL ;  Surgeon: Rogelia Copping, MD;  Location: [facility];  Service: Endoscopy;  Laterality: N/A;   IR BONE MARROW BIOPSY & ASPIRATION  04/22/2024   LAPAROSCOPIC HYSTERECTOMY  1980   POLYPECTOMY  02/21/2015   Procedure: POLYPECTOMY;   Surgeon: Rogelia Copping, MD;  Location: [facility];  Service: Endoscopy;;    FAMILY HISTORY: Family History  Problem Relation Age of Onset   Heart disease Mother    Stroke Father    Heart disease Brother    Kidney cancer Neg Hx    Prostate cancer Neg Hx    Bladder Cancer Neg Hx    Breast cancer Neg Hx     ADVANCED DIRECTIVES (Y/N):  N  HEALTH MAINTENANCE: Social History   Tobacco Use   Smoking status: Never   Smokeless tobacco: Never  Vaping Use   Vaping status: Never Used  Substance Use Topics   Alcohol use: No    Alcohol/week: 0.0 standard drinks of alcohol   Drug use: No     Colonoscopy:  PAP:  Bone density:  Lipid panel:  Allergies  Allergen Reactions   Revlimid  [Lenalidomide ]     Current Outpatient Medications  Medication Sig Dispense Refill   acyclovir  (ZOVIRAX ) 400 MG tablet Take 1 tablet (400 mg total) by mouth 2 (two) times daily. 60 tablet 11   aspirin EC 81 MG tablet Take 81 mg by mouth daily.     Cyanocobalamin (B-12 PO) Take 500 mcg by mouth daily.      Magnesium 400 MG TABS as directed Orally     Multiple Vitamins-Minerals (CENTRUM SILVER PO) Take 1 tablet by mouth daily.     triamcinolone  (NASACORT ) 55 MCG/ACT AERO nasal inhaler Place 2 sprays into the nose 2 (two) times daily as needed.     fexofenadine (ALLEGRA) 180 MG tablet Take 180 mg by mouth daily.     levothyroxine  (SYNTHROID ) 50 MCG tablet TAKE 1 TABLET BY MOUTH DAILY BEFORE BREAKFAST (Patient not taking: Reported on 06/16/2024) 90 tablet 3   POMALYST  4 MG capsule TAKE 1 CAPSULE BY MOUTH EVERY DAY FOR 21 DAYS THEN HOLD FOR 7 DAYS, REPEAT EVERY 28 DAYS (Patient not taking: Reported on 04/29/2024) 21 capsule 0   No current facility-administered medications for this visit.   Facility-Administered Medications Ordered in Other Visits  Medication Dose Route Frequency Provider Last Rate Last Admin   acetaminophen  (TYLENOL ) tablet 650 mg  650 mg Oral Once Kijana Estock J, MD        daratumumab -hyaluronidase -fihj (DARZALEX  FASPRO) 1800-30000 MG-UT/15ML chemo SQ injection 1,800 mg  1,800 mg Subcutaneous Once Saprina Chuong J, MD       dexamethasone  (DECADRON ) tablet 20 mg  20 mg Oral Once Keddrick Wyne J, MD       diphenhydrAMINE  (BENADRYL ) capsule 25 mg  25 mg Oral Once Helayne Metsker J, MD       montelukast  (SINGULAIR ) tablet 10 mg  10 mg Oral Once Stevi Hollinshead J, MD        OBJECTIVE: Vitals:   06/16/24 0830  BP: 122/76  Pulse: 69  Resp: 18  Temp: 98.7 F (37.1 C)  SpO2: 96%     Body mass index is 23.01 kg/m.    ECOG FS:0 - Asymptomatic  General: Well-developed, well-nourished, no acute distress. Eyes: Pink conjunctiva, anicteric sclera. HEENT: Normocephalic, moist mucous membranes. Lungs: No  audible wheezing or coughing. Heart: Regular rate and rhythm. Abdomen: Soft, nontender, no obvious distention. Musculoskeletal: No edema, cyanosis, or clubbing. Neuro: Alert, answering all questions appropriately. Cranial nerves grossly intact. Skin: No rashes or petechiae noted. Psych: Normal affect.  LAB RESULTS:  Lab Results  Component Value Date   NA 139 06/16/2024   K 3.7 06/16/2024   CL 104 06/16/2024   CO2 24 06/16/2024   GLUCOSE 107 (H) 06/16/2024   BUN 13 06/16/2024   CREATININE 0.73 06/16/2024   CALCIUM 8.9 06/16/2024   PROT 6.2 (L) 06/16/2024   ALBUMIN 4.1 06/16/2024   AST 22 06/16/2024   ALT 20 06/16/2024   ALKPHOS 73 06/16/2024   BILITOT 0.4 06/16/2024   GFRNONAA >60 06/16/2024   GFRAA 40 (L) 03/01/2020    Lab Results  Component Value Date   WBC 4.6 06/16/2024   NEUTROABS 2.9 06/16/2024   HGB 13.1 06/16/2024   HCT 39.4 06/16/2024   MCV 98.5 06/16/2024   PLT 164 06/16/2024   Lab Results  Component Value Date   TOTALPROTELP 6.5 04/13/2024   ALBUMINELP 3.5 05/16/2021   A1GS 0.2 05/16/2021   A2GS 0.6 05/16/2021   BETS 0.8 05/16/2021   GAMS 0.5 05/16/2021   MSPIKE Not Observed 05/16/2021   SPEI Comment 05/16/2021      STUDIES: No results found.    ASSESSMENT:  Multiple myeloma in relapse.  PLAN:    Multiple myeloma in relapse: Bone marrow biopsy on April 09, 2016 revealed 44% plasma cells and normal cytogenetics. Given the results of her metastatic bone survey on March 12, 2016, her elevated lambda free chains, and hypercalcemia, patient fit the criteria for multiple myeloma and was initially treated with single agent Revlimid .  She could not tolerate Revlimid  and was switched to pomalidomide  2 mg daily for 21 days with a 7-day break.  Repeat bone marrow biopsy on April 22, 2024 revealed persistent disease with 5% plasma cells.  M spike and immunoglobulins continue to remain within normal limits.  Her lambda free light chains continued to trend up and her most recent result is 958.7 indicating progressive disease.  Today's result is pending.  Patient has discontinued Pomalyst .  Proceed with cycle 1, day 1 of daratumumab .  Patient will also receive Zometa  today.  Return to clinic in 1 week for further evaluation and consideration of cycle 1, day 8.   Peripheral neuropathy: Chronic and unchanged.  Patient was previously given a referral to acupuncture which she said did not help much.  Hyponatremia: Resolved.   Diarrhea: Chronic and unchanged.  Patient reports GI has elected not to repeat colonoscopy.  I spent a total of 30 minutes reviewing chart data, face-to-face evaluation with the patient, counseling and coordination of care as detailed above.   Patient expressed understanding and was in agreement with this plan. She also understands that She can call clinic at any time with any questions, concerns, or complaints.    Evalene JINNY Reusing, MD 06/16/2024 9:05 AM         "

## 2024-06-17 ENCOUNTER — Telehealth: Payer: Self-pay

## 2024-06-17 NOTE — Telephone Encounter (Signed)
 Telephone call to patient for follow up after receiving first injection.    No answer and no answering machine or voice mail option available.

## 2024-06-23 ENCOUNTER — Encounter: Payer: Self-pay | Admitting: Oncology

## 2024-06-23 ENCOUNTER — Inpatient Hospital Stay: Admitting: Oncology

## 2024-06-23 ENCOUNTER — Inpatient Hospital Stay

## 2024-06-23 VITALS — BP 146/70 | HR 68

## 2024-06-23 VITALS — BP 122/64 | HR 73 | Temp 98.1°F | Resp 16 | Ht 61.0 in | Wt 120.5 lb

## 2024-06-23 DIAGNOSIS — C9000 Multiple myeloma not having achieved remission: Secondary | ICD-10-CM | POA: Diagnosis not present

## 2024-06-23 DIAGNOSIS — Z5112 Encounter for antineoplastic immunotherapy: Secondary | ICD-10-CM | POA: Diagnosis not present

## 2024-06-23 LAB — CBC WITH DIFFERENTIAL (CANCER CENTER ONLY)
Abs Immature Granulocytes: 0.01 K/uL (ref 0.00–0.07)
Basophils Absolute: 0 K/uL (ref 0.0–0.1)
Basophils Relative: 0 %
Eosinophils Absolute: 0 K/uL (ref 0.0–0.5)
Eosinophils Relative: 1 %
HCT: 40.4 % (ref 36.0–46.0)
Hemoglobin: 13.6 g/dL (ref 12.0–15.0)
Immature Granulocytes: 0 %
Lymphocytes Relative: 23 %
Lymphs Abs: 1.1 K/uL (ref 0.7–4.0)
MCH: 33.2 pg (ref 26.0–34.0)
MCHC: 33.7 g/dL (ref 30.0–36.0)
MCV: 98.5 fL (ref 80.0–100.0)
Monocytes Absolute: 0.7 K/uL (ref 0.1–1.0)
Monocytes Relative: 16 %
Neutro Abs: 2.8 K/uL (ref 1.7–7.7)
Neutrophils Relative %: 60 %
Platelet Count: 152 K/uL (ref 150–400)
RBC: 4.1 MIL/uL (ref 3.87–5.11)
RDW: 12.5 % (ref 11.5–15.5)
WBC Count: 4.7 K/uL (ref 4.0–10.5)
nRBC: 0 % (ref 0.0–0.2)

## 2024-06-23 LAB — CMP (CANCER CENTER ONLY)
ALT: 14 U/L (ref 0–44)
AST: 18 U/L (ref 15–41)
Albumin: 4.3 g/dL (ref 3.5–5.0)
Alkaline Phosphatase: 78 U/L (ref 38–126)
Anion gap: 12 (ref 5–15)
BUN: 21 mg/dL (ref 8–23)
CO2: 22 mmol/L (ref 22–32)
Calcium: 9 mg/dL (ref 8.9–10.3)
Chloride: 106 mmol/L (ref 98–111)
Creatinine: 0.76 mg/dL (ref 0.44–1.00)
GFR, Estimated: >60 mL/min
Glucose, Bld: 92 mg/dL (ref 70–99)
Potassium: 3.7 mmol/L (ref 3.5–5.1)
Sodium: 139 mmol/L (ref 135–145)
Total Bilirubin: 0.4 mg/dL (ref 0.0–1.2)
Total Protein: 6.4 g/dL — ABNORMAL LOW (ref 6.5–8.1)

## 2024-06-23 MED ORDER — MONTELUKAST SODIUM 10 MG PO TABS
10.0000 mg | ORAL_TABLET | Freq: Once | ORAL | Status: AC
  Administered 2024-06-23: 10 mg via ORAL
  Filled 2024-06-23: qty 1

## 2024-06-23 MED ORDER — DARATUMUMAB-HYALURONIDASE-FIHJ 1800-30000 MG-UT/15ML ~~LOC~~ SOLN
1800.0000 mg | Freq: Once | SUBCUTANEOUS | Status: AC
  Administered 2024-06-23: 1800 mg via SUBCUTANEOUS
  Filled 2024-06-23: qty 15

## 2024-06-23 MED ORDER — DIPHENHYDRAMINE HCL 25 MG PO TABS
25.0000 mg | ORAL_TABLET | Freq: Once | ORAL | Status: AC
  Administered 2024-06-23: 25 mg via ORAL
  Filled 2024-06-23: qty 1

## 2024-06-23 MED ORDER — DEXAMETHASONE 4 MG PO TABS
20.0000 mg | ORAL_TABLET | Freq: Once | ORAL | Status: AC
  Administered 2024-06-23: 20 mg via ORAL
  Filled 2024-06-23: qty 5

## 2024-06-23 MED ORDER — ACETAMINOPHEN 325 MG PO TABS
650.0000 mg | ORAL_TABLET | Freq: Once | ORAL | Status: AC
  Administered 2024-06-23: 650 mg via ORAL
  Filled 2024-06-23: qty 2

## 2024-06-23 NOTE — Patient Instructions (Signed)
 Daratumumab ; Hyaluronidase  Injection What is this medication? DARATUMUMAB ; HYALURONIDASE  (dar a toom ue mab; hye al ur ON i dase) treats multiple myeloma, a type of bone marrow cancer. Daratumumab  works by blocking a protein that causes cancer cells to grow and multiply. This helps to slow or stop the spread of cancer cells. Hyaluronidase  works by increasing the absorption of other medications in the body to help them work better. This medication may also be used treat amyloidosis, a condition that causes the buildup of a protein (amyloid) in your body. It works by reducing the buildup of this protein, which decreases symptoms. It is a combination medication that contains a monoclonal antibody. This medicine may be used for other purposes; ask your health care provider or pharmacist if you have questions. COMMON BRAND NAME(S): DARZALEX  FASPRO What should I tell my care team before I take this medication? They need to know if you have any of these conditions: Heart disease Infection, such as chickenpox, cold sores, herpes, hepatitis B Lung or breathing disease An unusual or allergic reaction to daratumumab , hyaluronidase , other medications, foods, dyes, or preservatives Pregnant or trying to get pregnant Breast-feeding How should I use this medication? This medication is injected under the skin. It is given by your care team in a hospital or clinic setting. Talk to your care team about the use of this medication in children. Special care may be needed. Overdosage: If you think you have taken too much of this medicine contact a poison control center or emergency room at once. NOTE: This medicine is only for you. Do not share this medicine with others. What if I miss a dose? Keep appointments for follow-up doses. It is important not to miss your dose. Call your care team if you are unable to keep an appointment. What may interact with this medication? Interactions have not been studied. This list  may not describe all possible interactions. Give your health care provider a list of all the medicines, herbs, non-prescription drugs, or dietary supplements you use. Also tell them if you smoke, drink alcohol, or use illegal drugs. Some items may interact with your medicine. What should I watch for while using this medication? Your condition will be monitored carefully while you are receiving this medication. This medication can cause serious allergic reactions. To reduce your risk, your care team may give you other medication to take before receiving this one. Be sure to follow the directions from your care team. This medication can affect the results of blood tests to match your blood type. These changes can last for up to 6 months after the final dose. Your care team will do blood tests to match your blood type before you start treatment. Tell all of your care team that you are being treated with this medication before receiving a blood transfusion. This medication can affect the results of some tests used to determine treatment response; extra tests may be needed to evaluate response. Talk to your care team if you wish to become pregnant or think you are pregnant. This medication can cause serious birth defects if taken during pregnancy and for 3 months after the last dose. A reliable form of contraception is recommended while taking this medication and for 3 months after the last dose. Talk to your care team about effective forms of contraception. Do not breast-feed while taking this medication. What side effects may I notice from receiving this medication? Side effects that you should report to your care team as soon as  possible: Allergic reactions--skin rash, itching, hives, swelling of the face, lips, tongue, or throat Heart rhythm changes--fast or irregular heartbeat, dizziness, feeling faint or lightheaded, chest pain, trouble breathing Infection--fever, chills, cough, sore throat, wounds that  don't heal, pain or trouble when passing urine, general feeling of discomfort or being unwell Infusion reactions--chest pain, shortness of breath or trouble breathing, feeling faint or lightheaded Sudden eye pain or change in vision such as blurry vision, seeing halos around lights, vision loss Unusual bruising or bleeding Side effects that usually do not require medical attention (report to your care team if they continue or are bothersome): Constipation Diarrhea Fatigue Nausea Pain, tingling, or numbness in the hands or feet Swelling of the ankles, hands, or feet This list may not describe all possible side effects. Call your doctor for medical advice about side effects. You may report side effects to FDA at 1-800-FDA-1088. Where should I keep my medication? This medication is given in a hospital or clinic. It will not be stored at home. NOTE: This sheet is a summary. It may not cover all possible information. If you have questions about this medicine, talk to your doctor, pharmacist, or health care provider.  2024 Elsevier/Gold Standard (2021-10-03 00:00:00)

## 2024-06-23 NOTE — Progress Notes (Signed)
 " [facility]  [phone] [phone]  ID: Rhonda [name] OB: 03-20-1934  MR#: [id number]  RDW#:[id number]  Patient Care Team: Liana Fish, NP as PCP - General (Nurse Practitioner) Darliss Rogue, MD as PCP - Cardiology (Cardiology) Jacobo Rhonda PARAS, MD as Consulting Physician (Oncology)   CHIEF COMPLAINT:  Multiple myeloma in relapse.  INTERVAL HISTORY: Patient returns to clinic today for further evaluation and consideration of cycle 1, day 8 of single agent daratumumab .  She tolerated first injection well without significant side effects.  She had increased insomnia for several days after receiving dexamethasone .  She currently feels well and is asymptomatic. She has a chronic peripheral neuropathy, but no other neurologic complaints.  She denies any recent fevers or illnesses.  She has a good appetite and denies weight loss.  She denies any chest pain, shortness of breath, cough, or hemoptysis.  She denies any nausea, vomiting, constipation, or diarrhea.  She has no melena or hematochezia.  She has no urinary complaints.  Patient offers no further specific complaints today.  REVIEW OF SYSTEMS:   Review of Systems  Constitutional: Negative.  Negative for fever, malaise/fatigue and weight loss.  HENT:  Negative for congestion and sore throat.   Respiratory: Negative.  Negative for cough and shortness of breath.   Cardiovascular: Negative.  Negative for chest pain, palpitations and leg swelling.  Gastrointestinal: Negative.  Negative for abdominal pain, blood in stool, constipation, diarrhea, melena, nausea and vomiting.  Genitourinary: Negative.  Negative for dysuria.  Musculoskeletal: Negative.  Negative for back pain.  Skin: Negative.  Negative for rash.  Neurological:  Positive for tingling and sensory change. Negative for focal weakness and weakness.  Psychiatric/Behavioral: Negative.  The patient is not nervous/anxious and does not  have insomnia.     As per HPI. Otherwise, a complete review of systems is negative.  PAST MEDICAL HISTORY: Past Medical History:  Diagnosis Date   Actinic keratosis    Arthritis    hands   Benign neoplasm of ascending colon    Cataract    GERD (gastroesophageal reflux disease)    Hyperlipemia    Hypertension 01/31/2015   Hypothyroidism    Melanoma (HCC) 04/28/2018   R mid to distal ant lat thigh. MM, SS, tumor thickness 0.86mm, antatomic level II. Excised: 05/13/18, margins free   Multiple myeloma (HCC) 04/18/2016   Multiple myeloma in remission (HCC) 04/18/2016   Osteoporosis    SCC (squamous cell carcinoma) 06/17/2023   R volar forearm, [facility]   Shingles    Skin cancer    basal cell   Squamous cell carcinoma in situ (SCCIS) 02/06/2024   right medial knee, [facility]   Squamous cell carcinoma of skin 02/04/2014   Left pretibial. KA-like pattern   Squamous cell carcinoma of skin 09/15/2015   Right lat. superior ankle are. Superficial infiltration. Tx: [facility]   Squamous cell carcinoma of skin 10/15/2016   Left medial mid lower leg. Tx: [facility]   Squamous cell carcinoma of skin 11/27/2018   Right dorsum proximal forearm.   Squamous cell carcinoma of skin 09/09/2019   Right cheek lat. to mid nasolabial area. Tx: [facility]   Squamous cell carcinoma of skin 12/09/2019   right pretibial below knee   Squamous cell carcinoma of skin 06/25/2022   Left ant forearm - [facility]    PAST SURGICAL HISTORY: Past Surgical History:  Procedure Laterality Date   BREAST EXCISIONAL BIOPSY Right 15+ yrs ago   EXCISIONAL - NEG   CATARACT  EXTRACTION W/ INTRAOCULAR LENS IMPLANT     CHOLECYSTECTOMY N/A 03/14/2017   Procedure: LAPAROSCOPIC CHOLECYSTECTOMY;  Surgeon: Wonda Charlie BRAVO, MD;  Location: [facility];  Service: General;  Laterality: N/A;   COLONOSCOPY WITH PROPOFOL  N/A 02/21/2015   Procedure: COLONOSCOPY WITH PROPOFOL ;  Surgeon: Rogelia Copping, MD;  Location: [facility];  Service: Endoscopy;  Laterality:  N/A;   IR BONE MARROW BIOPSY & ASPIRATION  04/22/2024   LAPAROSCOPIC HYSTERECTOMY  1980   POLYPECTOMY  02/21/2015   Procedure: POLYPECTOMY;  Surgeon: Rogelia Copping, MD;  Location: [facility];  Service: Endoscopy;;    FAMILY HISTORY: Family History  Problem Relation Age of Onset   Heart disease Mother    Stroke Father    Heart disease Brother    Kidney cancer Neg Hx    Prostate cancer Neg Hx    Bladder Cancer Neg Hx    Breast cancer Neg Hx     ADVANCED DIRECTIVES (Y/N):  N  HEALTH MAINTENANCE: Social History   Tobacco Use   Smoking status: Never   Smokeless tobacco: Never  Vaping Use   Vaping status: Never Used  Substance Use Topics   Alcohol use: No    Alcohol/week: 0.0 standard drinks of alcohol   Drug use: No     Colonoscopy:  PAP:  Bone density:  Lipid panel:  Allergies  Allergen Reactions   Revlimid  [Lenalidomide ]     Current Outpatient Medications  Medication Sig Dispense Refill   acyclovir  (ZOVIRAX ) 400 MG tablet Take 1 tablet (400 mg total) by mouth 2 (two) times daily. 60 tablet 11   aspirin EC 81 MG tablet Take 81 mg by mouth daily.     Cyanocobalamin (B-12 PO) Take 500 mcg by mouth daily.      fexofenadine (ALLEGRA) 180 MG tablet Take 180 mg by mouth daily.     levothyroxine  (SYNTHROID ) 50 MCG tablet TAKE 1 TABLET BY MOUTH DAILY BEFORE BREAKFAST 90 tablet 3   Magnesium 400 MG TABS as directed Orally     Multiple Vitamins-Minerals (CENTRUM SILVER PO) Take 1 tablet by mouth daily.     triamcinolone  (NASACORT ) 55 MCG/ACT AERO nasal inhaler Place 2 sprays into the nose 2 (two) times daily as needed.     No current facility-administered medications for this visit.    OBJECTIVE: Vitals:   06/23/24 0833  BP: 122/64  Pulse: 73  Resp: 16  Temp: 98.1 F (36.7 C)  SpO2: 100%     Body mass index is 22.77 kg/m.    ECOG FS:0 - Asymptomatic  General: Well-developed, well-nourished, no acute distress. Eyes: Pink conjunctiva, anicteric  sclera. HEENT: Normocephalic, moist mucous membranes. Lungs: No audible wheezing or coughing. Heart: Regular rate and rhythm. Abdomen: Soft, nontender, no obvious distention. Musculoskeletal: No edema, cyanosis, or clubbing. Neuro: Alert, answering all questions appropriately. Cranial nerves grossly intact. Skin: No rashes or petechiae noted. Psych: Normal affect.  LAB RESULTS:  Lab Results  Component Value Date   NA 139 06/23/2024   K 3.7 06/23/2024   CL 106 06/23/2024   CO2 22 06/23/2024   GLUCOSE 92 06/23/2024   BUN 21 06/23/2024   CREATININE 0.76 06/23/2024   CALCIUM 9.0 06/23/2024   PROT 6.4 (L) 06/23/2024   ALBUMIN 4.3 06/23/2024   AST 18 06/23/2024   ALT 14 06/23/2024   ALKPHOS 78 06/23/2024   BILITOT 0.4 06/23/2024   GFRNONAA >60 06/23/2024   GFRAA 40 (L) 03/01/2020    Lab Results  Component Value Date  WBC 4.7 06/23/2024   NEUTROABS 2.8 06/23/2024   HGB 13.6 06/23/2024   HCT 40.4 06/23/2024   MCV 98.5 06/23/2024   PLT 152 06/23/2024   Lab Results  Component Value Date   TOTALPROTELP 6.5 04/13/2024   ALBUMINELP 3.5 05/16/2021   A1GS 0.2 05/16/2021   A2GS 0.6 05/16/2021   BETS 0.8 05/16/2021   GAMS 0.5 05/16/2021   MSPIKE Not Observed 05/16/2021   SPEI Comment 05/16/2021     STUDIES: No results found.    ASSESSMENT:  Multiple myeloma in relapse.  PLAN:    Multiple myeloma in relapse: Bone marrow biopsy on April 09, 2016 revealed 44% plasma cells and normal cytogenetics. Given the results of her metastatic bone survey on March 12, 2016, her elevated lambda free chains, and hypercalcemia, patient fit the criteria for multiple myeloma and was initially treated with single agent Revlimid .  She could not tolerate Revlimid  and was switched to pomalidomide  2 mg daily for 21 days with a 7-day break.  Repeat bone marrow biopsy on April 22, 2024 revealed persistent disease with 5% plasma cells.  M spike and immunoglobulins continue to remain within  normal limits.  Her lambda free light chains continued to trend up and her most recent result is 958.7 indicating progressive disease.  Patient has discontinued Pomalyst .  Proceed with cycle 1, day 8 of daratumumab .  Continue Zometa  every 3 months.  Return to clinic in 1 week for further evaluation and consideration of cycle 1, day 15.   Insomnia: Decrease dose of dexamethasone  to 10 mg. Peripheral neuropathy: Chronic and unchanged.  Patient was previously given a referral to acupuncture which she said did not help much.  Diarrhea: Patient does not complain of this today.  Patient reports GI has elected not to repeat colonoscopy.  I spent a total of 30 minutes reviewing chart data, face-to-face evaluation with the patient, counseling and coordination of care as detailed above.   Patient expressed understanding and was in agreement with this plan. She also understands that She can call clinic at any time with any questions, concerns, or complaints.    Rhonda JINNY Reusing, MD 06/23/2024 8:43 AM         "

## 2024-06-23 NOTE — Progress Notes (Signed)
 Patient did good after getting her first treatment.

## 2024-06-24 ENCOUNTER — Other Ambulatory Visit: Payer: Self-pay

## 2024-06-25 ENCOUNTER — Other Ambulatory Visit: Payer: Self-pay

## 2024-06-30 ENCOUNTER — Encounter: Payer: Self-pay | Admitting: Oncology

## 2024-06-30 ENCOUNTER — Inpatient Hospital Stay

## 2024-06-30 ENCOUNTER — Other Ambulatory Visit: Payer: Self-pay | Admitting: *Deleted

## 2024-06-30 ENCOUNTER — Telehealth: Payer: Self-pay

## 2024-06-30 ENCOUNTER — Inpatient Hospital Stay: Admitting: Oncology

## 2024-06-30 VITALS — BP 148/72 | HR 66 | Temp 96.5°F | Resp 19

## 2024-06-30 VITALS — BP 128/84 | HR 77 | Temp 99.0°F | Resp 18 | Ht 61.0 in | Wt 121.0 lb

## 2024-06-30 DIAGNOSIS — C9000 Multiple myeloma not having achieved remission: Secondary | ICD-10-CM

## 2024-06-30 DIAGNOSIS — N390 Urinary tract infection, site not specified: Secondary | ICD-10-CM

## 2024-06-30 DIAGNOSIS — Z5112 Encounter for antineoplastic immunotherapy: Secondary | ICD-10-CM | POA: Diagnosis not present

## 2024-06-30 LAB — URINALYSIS, COMPLETE (UACMP) WITH MICROSCOPIC
Bacteria, UA: NONE SEEN
Bilirubin Urine: NEGATIVE
Glucose, UA: NEGATIVE mg/dL
Hgb urine dipstick: NEGATIVE
Ketones, ur: NEGATIVE mg/dL
Nitrite: NEGATIVE
Protein, ur: NEGATIVE mg/dL
Specific Gravity, Urine: 1.003 — ABNORMAL LOW (ref 1.005–1.030)
Squamous Epithelial / HPF: 0 /HPF (ref 0–5)
pH: 6 (ref 5.0–8.0)

## 2024-06-30 LAB — CBC WITH DIFFERENTIAL (CANCER CENTER ONLY)
Abs Immature Granulocytes: 0.01 K/uL (ref 0.00–0.07)
Basophils Absolute: 0 K/uL (ref 0.0–0.1)
Basophils Relative: 0 %
Eosinophils Absolute: 0 K/uL (ref 0.0–0.5)
Eosinophils Relative: 0 %
HCT: 40.6 % (ref 36.0–46.0)
Hemoglobin: 13.7 g/dL (ref 12.0–15.0)
Immature Granulocytes: 0 %
Lymphocytes Relative: 21 %
Lymphs Abs: 0.6 K/uL — ABNORMAL LOW (ref 0.7–4.0)
MCH: 32.7 pg (ref 26.0–34.0)
MCHC: 33.7 g/dL (ref 30.0–36.0)
MCV: 96.9 fL (ref 80.0–100.0)
Monocytes Absolute: 0.6 K/uL (ref 0.1–1.0)
Monocytes Relative: 19 %
Neutro Abs: 1.9 K/uL (ref 1.7–7.7)
Neutrophils Relative %: 60 %
Platelet Count: 137 K/uL — ABNORMAL LOW (ref 150–400)
RBC: 4.19 MIL/uL (ref 3.87–5.11)
RDW: 12.5 % (ref 11.5–15.5)
WBC Count: 3.1 K/uL — ABNORMAL LOW (ref 4.0–10.5)
nRBC: 0 % (ref 0.0–0.2)

## 2024-06-30 LAB — CMP (CANCER CENTER ONLY)
ALT: 19 U/L (ref 0–44)
AST: 23 U/L (ref 15–41)
Albumin: 4.2 g/dL (ref 3.5–5.0)
Alkaline Phosphatase: 75 U/L (ref 38–126)
Anion gap: 10 (ref 5–15)
BUN: 19 mg/dL (ref 8–23)
CO2: 25 mmol/L (ref 22–32)
Calcium: 9.1 mg/dL (ref 8.9–10.3)
Chloride: 104 mmol/L (ref 98–111)
Creatinine: 0.8 mg/dL (ref 0.44–1.00)
GFR, Estimated: >60 mL/min
Glucose, Bld: 101 mg/dL — ABNORMAL HIGH (ref 70–99)
Potassium: 4.2 mmol/L (ref 3.5–5.1)
Sodium: 139 mmol/L (ref 135–145)
Total Bilirubin: 0.4 mg/dL (ref 0.0–1.2)
Total Protein: 6.3 g/dL — ABNORMAL LOW (ref 6.5–8.1)

## 2024-06-30 MED ORDER — DEXAMETHASONE 4 MG PO TABS
10.0000 mg | ORAL_TABLET | Freq: Once | ORAL | Status: AC
  Administered 2024-06-30: 10 mg via ORAL
  Filled 2024-06-30: qty 3

## 2024-06-30 MED ORDER — MONTELUKAST SODIUM 10 MG PO TABS: 10.0000 mg | ORAL_TABLET | Freq: Once | ORAL | 1 refills | Status: AC

## 2024-06-30 MED ORDER — DIPHENHYDRAMINE HCL 25 MG PO TABS
25.0000 mg | ORAL_TABLET | Freq: Once | ORAL | Status: AC
  Administered 2024-06-30: 25 mg via ORAL
  Filled 2024-06-30: qty 1

## 2024-06-30 MED ORDER — DARATUMUMAB-HYALURONIDASE-FIHJ 1800-30000 MG-UT/15ML ~~LOC~~ SOLN
1800.0000 mg | Freq: Once | SUBCUTANEOUS | Status: AC
  Administered 2024-06-30: 1800 mg via SUBCUTANEOUS
  Filled 2024-06-30: qty 15

## 2024-06-30 MED ORDER — MONTELUKAST SODIUM 10 MG PO TABS
10.0000 mg | ORAL_TABLET | Freq: Once | ORAL | Status: AC
  Administered 2024-06-30: 10 mg via ORAL
  Filled 2024-06-30: qty 1

## 2024-06-30 MED ORDER — DEXAMETHASONE 2 MG PO TABS: 10.0000 mg | ORAL_TABLET | Freq: Once | ORAL | 1 refills | Status: AC

## 2024-06-30 MED ORDER — ACETAMINOPHEN 325 MG PO TABS
650.0000 mg | ORAL_TABLET | Freq: Once | ORAL | Status: AC
  Administered 2024-06-30: 650 mg via ORAL
  Filled 2024-06-30: qty 2

## 2024-06-30 NOTE — Progress Notes (Signed)
 Patient says starting Saturday her lower back started hurting and she is having a little bit of burning and odor, so I gave her a urine cup. Diarrhea also, taking imodium.

## 2024-06-30 NOTE — Telephone Encounter (Signed)
 Called and spoke with patient in regards to her UA she had done while she was in our office this morning, I told her that her results came back negative, I told her that is her lower back pain worsens let us  know. She understood and agreed with this plan.

## 2024-06-30 NOTE — Progress Notes (Addendum)
 Here [facility]  Telephone:(336) [phone] [phone]) [phone]  ID: Rhonda [name] OB: [birth year]  MR#: [id number]  RDW#:[id number]  Patient Care Team: Liana Fish, NP as PCP - General (Nurse Practitioner) Darliss Rogue, MD as PCP - Cardiology (Cardiology) Jacobo Evalene PARAS, MD as Consulting Physician (Oncology)   CHIEF COMPLAINT:  Multiple myeloma in relapse.  INTERVAL HISTORY: Patient returns to clinic today for further evaluation and consideration of cycle 1, day 15 of single agent daratumumab .  She has noticed increased fatigue that does not affect her day-to-day activity, but otherwise is tolerating her treatments well.  She has a chronic peripheral neuropathy, but no other neurologic complaints.  She denies any recent fevers or illnesses.  She has a good appetite and denies weight loss.  She denies any chest pain, shortness of breath, cough, or hemoptysis.  She denies any nausea, vomiting, constipation, or diarrhea.  She has no melena or hematochezia.  She has no urinary complaints.  Patient offers no further specific complaints today.  REVIEW OF SYSTEMS:   Review of Systems  Constitutional:  Positive for malaise/fatigue. Negative for fever and weight loss.  HENT:  Negative for congestion and sore throat.   Respiratory: Negative.  Negative for cough and shortness of breath.   Cardiovascular: Negative.  Negative for chest pain, palpitations and leg swelling.  Gastrointestinal: Negative.  Negative for abdominal pain, blood in stool, constipation, diarrhea, melena, nausea and vomiting.  Genitourinary: Negative.  Negative for dysuria.  Musculoskeletal: Negative.  Negative for back pain.  Skin: Negative.  Negative for rash.  Neurological:  Positive for tingling and sensory change. Negative for focal weakness and weakness.  Psychiatric/Behavioral: Negative.  The patient is not nervous/anxious and does not have insomnia.     As per HPI. Otherwise, a  complete review of systems is negative.  PAST MEDICAL HISTORY: Past Medical History:  Diagnosis Date   Actinic keratosis    Arthritis    hands   Benign neoplasm of ascending colon    Cataract    GERD (gastroesophageal reflux disease)    Hyperlipemia    Hypertension 01/31/2015   Hypothyroidism    Melanoma (HCC) 04/28/2018   R mid to distal ant lat thigh. MM, SS, tumor thickness 0.94mm, antatomic level II. Excised: 05/13/18, margins free   Multiple myeloma (HCC) 04/18/2016   Multiple myeloma in remission (HCC) 04/18/2016   Osteoporosis    SCC (squamous cell carcinoma) 06/17/2023   R volar forearm, [facility]   Shingles    Skin cancer    basal cell   Squamous cell carcinoma in situ (SCCIS) 02/06/2024   right medial knee, [facility]   Squamous cell carcinoma of skin 02/04/2014   Left pretibial. KA-like pattern   Squamous cell carcinoma of skin 09/15/2015   Right lat. superior ankle are. Superficial infiltration. Tx: [facility]   Squamous cell carcinoma of skin 10/15/2016   Left medial mid lower leg. Tx: [facility]   Squamous cell carcinoma of skin 11/27/2018   Right dorsum proximal forearm.   Squamous cell carcinoma of skin 09/09/2019   Right cheek lat. to mid nasolabial area. Tx: [facility]   Squamous cell carcinoma of skin 12/09/2019   right pretibial below knee   Squamous cell carcinoma of skin 06/25/2022   Left ant forearm - [facility]    PAST SURGICAL HISTORY: Past Surgical History:  Procedure Laterality Date   BREAST EXCISIONAL BIOPSY Right 15+ yrs ago   EXCISIONAL - NEG   CATARACT EXTRACTION W/ INTRAOCULAR LENS IMPLANT  CHOLECYSTECTOMY N/A 03/14/2017   Procedure: LAPAROSCOPIC CHOLECYSTECTOMY;  Surgeon: Wonda Charlie BRAVO, MD;  Location: [facility];  Service: General;  Laterality: N/A;   COLONOSCOPY WITH PROPOFOL  N/A 02/21/2015   Procedure: COLONOSCOPY WITH PROPOFOL ;  Surgeon: Rogelia Copping, MD;  Location: [facility];  Service: Endoscopy;  Laterality: N/A;   IR BONE MARROW BIOPSY & ASPIRATION   04/22/2024   LAPAROSCOPIC HYSTERECTOMY  1980   POLYPECTOMY  02/21/2015   Procedure: POLYPECTOMY;  Surgeon: Rogelia Copping, MD;  Location: [facility];  Service: Endoscopy;;    FAMILY HISTORY: Family History  Problem Relation Age of Onset   Heart disease Mother    Stroke Father    Heart disease Brother    Kidney cancer Neg Hx    Prostate cancer Neg Hx    Bladder Cancer Neg Hx    Breast cancer Neg Hx     ADVANCED DIRECTIVES (Y/N):  N  HEALTH MAINTENANCE: Social History   Tobacco Use   Smoking status: Never   Smokeless tobacco: Never  Vaping Use   Vaping status: Never Used  Substance Use Topics   Alcohol use: No    Alcohol/week: 0.0 standard drinks of alcohol   Drug use: No     Colonoscopy:  PAP:  Bone density:  Lipid panel:  Allergies  Allergen Reactions   Revlimid  [Lenalidomide ]     Current Outpatient Medications  Medication Sig Dispense Refill   aspirin EC 81 MG tablet Take 81 mg by mouth daily.     fexofenadine (ALLEGRA) 180 MG tablet Take 180 mg by mouth daily.     levothyroxine  (SYNTHROID ) 50 MCG tablet TAKE 1 TABLET BY MOUTH DAILY BEFORE BREAKFAST 90 tablet 3   Magnesium 400 MG TABS as directed Orally     Multiple Vitamins-Minerals (CENTRUM SILVER PO) Take 1 tablet by mouth daily.     triamcinolone  (NASACORT ) 55 MCG/ACT AERO nasal inhaler Place 2 sprays into the nose 2 (two) times daily as needed.     No current facility-administered medications for this visit.   Facility-Administered Medications Ordered in Other Visits  Medication Dose Route Frequency Provider Last Rate Last Admin   acetaminophen  (TYLENOL ) tablet 650 mg  650 mg Oral Once Rhonda [name] J, MD       daratumumab -hyaluronidase -fihj (DARZALEX  FASPRO) 1800-30000 MG-UT/15ML chemo SQ injection 1,800 mg  1,800 mg Subcutaneous Once Rhonda [name] J, MD       dexamethasone  (DECADRON ) tablet 10 mg  10 mg Oral Once Rhonda [name] J, MD       diphenhydrAMINE  (BENADRYL ) tablet 25 mg  25  mg Oral Once Rhonda [name] J, MD       montelukast  (SINGULAIR ) tablet 10 mg  10 mg Oral Once Rhonda [name] J, MD        OBJECTIVE: Vitals:   06/30/24 0848  BP: 128/84  Pulse: 77  Resp: 18  Temp: 99 F (37.2 C)  SpO2: 97%     Body mass index is 22.86 kg/m.    ECOG FS:0 - Asymptomatic  General: Well-developed, well-nourished, no acute distress. Eyes: Pink conjunctiva, anicteric sclera. HEENT: Normocephalic, moist mucous membranes. Lungs: No audible wheezing or coughing. Heart: Regular rate and rhythm. Abdomen: Soft, nontender, no obvious distention. Musculoskeletal: No edema, cyanosis, or clubbing. Neuro: Alert, answering all questions appropriately. Cranial nerves grossly intact. Skin: No rashes or petechiae noted. Psych: Normal affect.  LAB RESULTS:  Lab Results  Component Value Date   NA 139 06/30/2024   K 4.2 06/30/2024   CL 104  06/30/2024   CO2 25 06/30/2024   GLUCOSE 101 (H) 06/30/2024   BUN 19 06/30/2024   CREATININE 0.80 06/30/2024   CALCIUM 9.1 06/30/2024   PROT 6.3 (L) 06/30/2024   ALBUMIN 4.2 06/30/2024   AST 23 06/30/2024   ALT 19 06/30/2024   ALKPHOS 75 06/30/2024   BILITOT 0.4 06/30/2024   GFRNONAA >60 06/30/2024   GFRAA 40 (L) 03/01/2020    Lab Results  Component Value Date   WBC 3.1 (L) 06/30/2024   NEUTROABS 1.9 06/30/2024   HGB 13.7 06/30/2024   HCT 40.6 06/30/2024   MCV 96.9 06/30/2024   PLT 137 (L) 06/30/2024   Lab Results  Component Value Date   TOTALPROTELP 6.5 04/13/2024   ALBUMINELP 3.5 05/16/2021   A1GS 0.2 05/16/2021   A2GS 0.6 05/16/2021   BETS 0.8 05/16/2021   GAMS 0.5 05/16/2021   MSPIKE Not Observed 05/16/2021   SPEI Comment 05/16/2021     STUDIES: No results found.    ASSESSMENT:  Multiple myeloma in relapse.  PLAN:    Multiple myeloma in relapse: Bone marrow biopsy on April 09, 2016 revealed 44% plasma cells and normal cytogenetics. Given the results of her metastatic bone survey on March 12, 2016, her elevated lambda free chains, and hypercalcemia, patient fit the criteria for multiple myeloma and was initially treated with single agent Revlimid .  She could not tolerate Revlimid  and was switched to pomalidomide  2 mg daily for 21 days with a 7-day break.  Repeat bone marrow biopsy on April 22, 2024 revealed persistent disease with 5% plasma cells.  M spike and immunoglobulins continue to remain within normal limits.  Her lambda free light chains continued to trend up and her most recent result is 958.7 indicating progressive disease.  Patient has discontinued Pomalyst .  Proceed with cycle 1, day 15 and daratumumab .  Continue Zometa  every 3 months.  Patient last received treatment on April 16, 2024.  Return to clinic in 1 week for further evaluation and consideration of cycle 1, day 22. Premedications: Patient will take these at home (650 mg Tylenol , 25 mg Benadryl , 10 mg dexamethasone ) and have been discontinued from her treatment plan. Insomnia: Improved.  Dexamethasone  was decreased to 10 mg.  Peripheral neuropathy: Chronic and unchanged.  Patient was previously given a referral to acupuncture which she said did not help much.  Diarrhea: Patient does not complain of this today.  Patient reports GI has elected not to repeat colonoscopy. Leukopenia: Mild, monitor. Thrombocytopenia: Mild, monitor.  Patient expressed understanding and was in agreement with this plan. She also understands that She can call clinic at any time with any questions, concerns, or complaints.    Evalene JINNY Reusing, MD 06/30/24 9:11 AM

## 2024-07-01 LAB — URINE CULTURE: Culture: NO GROWTH

## 2024-07-06 ENCOUNTER — Other Ambulatory Visit: Payer: Self-pay | Admitting: Oncology

## 2024-07-06 DIAGNOSIS — C9000 Multiple myeloma not having achieved remission: Secondary | ICD-10-CM

## 2024-07-07 ENCOUNTER — Inpatient Hospital Stay

## 2024-07-07 ENCOUNTER — Inpatient Hospital Stay: Admitting: Oncology

## 2024-07-08 ENCOUNTER — Other Ambulatory Visit: Payer: Self-pay

## 2024-07-09 ENCOUNTER — Encounter: Payer: Self-pay | Admitting: Oncology

## 2024-07-09 ENCOUNTER — Inpatient Hospital Stay

## 2024-07-09 ENCOUNTER — Inpatient Hospital Stay (HOSPITAL_BASED_OUTPATIENT_CLINIC_OR_DEPARTMENT_OTHER): Admitting: Oncology

## 2024-07-09 VITALS — BP 139/89 | HR 71 | Temp 97.0°F | Resp 18

## 2024-07-09 VITALS — BP 132/65 | HR 72 | Temp 97.6°F | Resp 20 | Wt 118.7 lb

## 2024-07-09 DIAGNOSIS — C9000 Multiple myeloma not having achieved remission: Secondary | ICD-10-CM | POA: Diagnosis not present

## 2024-07-09 DIAGNOSIS — Z5112 Encounter for antineoplastic immunotherapy: Secondary | ICD-10-CM | POA: Diagnosis not present

## 2024-07-09 LAB — CBC WITH DIFFERENTIAL (CANCER CENTER ONLY)
Abs Immature Granulocytes: 0.01 10*3/uL (ref 0.00–0.07)
Basophils Absolute: 0 10*3/uL (ref 0.0–0.1)
Basophils Relative: 0 %
Eosinophils Absolute: 0 10*3/uL (ref 0.0–0.5)
Eosinophils Relative: 0 %
HCT: 38.9 % (ref 36.0–46.0)
Hemoglobin: 13.4 g/dL (ref 12.0–15.0)
Immature Granulocytes: 0 %
Lymphocytes Relative: 22 %
Lymphs Abs: 0.7 10*3/uL (ref 0.7–4.0)
MCH: 32.7 pg (ref 26.0–34.0)
MCHC: 34.4 g/dL (ref 30.0–36.0)
MCV: 94.9 fL (ref 80.0–100.0)
Monocytes Absolute: 0.6 10*3/uL (ref 0.1–1.0)
Monocytes Relative: 20 %
Neutro Abs: 1.8 10*3/uL (ref 1.7–7.7)
Neutrophils Relative %: 58 %
Platelet Count: 125 10*3/uL — ABNORMAL LOW (ref 150–400)
RBC: 4.1 MIL/uL (ref 3.87–5.11)
RDW: 12.5 % (ref 11.5–15.5)
WBC Count: 3.1 10*3/uL — ABNORMAL LOW (ref 4.0–10.5)
nRBC: 0 % (ref 0.0–0.2)

## 2024-07-09 LAB — CMP (CANCER CENTER ONLY)
ALT: 18 U/L (ref 0–44)
AST: 21 U/L (ref 15–41)
Albumin: 4.2 g/dL (ref 3.5–5.0)
Alkaline Phosphatase: 95 U/L (ref 38–126)
Anion gap: 11 (ref 5–15)
BUN: 18 mg/dL (ref 8–23)
CO2: 23 mmol/L (ref 22–32)
Calcium: 8.9 mg/dL (ref 8.9–10.3)
Chloride: 103 mmol/L (ref 98–111)
Creatinine: 0.88 mg/dL (ref 0.44–1.00)
GFR, Estimated: >60 mL/min
Glucose, Bld: 128 mg/dL — ABNORMAL HIGH (ref 70–99)
Potassium: 4.3 mmol/L (ref 3.5–5.1)
Sodium: 137 mmol/L (ref 135–145)
Total Bilirubin: 0.3 mg/dL (ref 0.0–1.2)
Total Protein: 6.2 g/dL — ABNORMAL LOW (ref 6.5–8.1)

## 2024-07-09 MED ORDER — DARATUMUMAB-HYALURONIDASE-FIHJ 1800-30000 MG-UT/15ML ~~LOC~~ SOLN
1800.0000 mg | Freq: Once | SUBCUTANEOUS | Status: AC
  Administered 2024-07-09: 1800 mg via SUBCUTANEOUS
  Filled 2024-07-09: qty 15

## 2024-07-09 NOTE — Progress Notes (Signed)
 Here [facility]  Telephone:(336) [phone] Fax:(336) [phone]  ID: Rhonda [name] Rex OB: Sep 13, 1933  MR#: [id number]  RDW#:[id number]  Patient Care Team: Liana Fish, NP as PCP - General (Nurse Practitioner) Darliss Rogue, MD as PCP - Cardiology (Cardiology) Jacobo Evalene PARAS, MD as Consulting Physician (Oncology)   CHIEF COMPLAINT:  Multiple myeloma in relapse.  INTERVAL HISTORY: Patient returns to clinic today for further evaluation and consideration of cycle 1, day 22 of single agent daratumumab .  Treatment was delayed several days secondary to weather.  She does not complain of weakness or fatigue today.  She currently feels well and is asymptomatic.  She is tolerating her treatments without significant side effects. She has a chronic peripheral neuropathy, but no other neurologic complaints.  She denies any recent fevers or illnesses.  She has a good appetite and denies weight loss.  She denies any chest pain, shortness of breath, cough, or hemoptysis.  She denies any nausea, vomiting, constipation, or diarrhea.  She has no melena or hematochezia.  She has no urinary complaints.  Patient offers no further specific complaints today.  REVIEW OF SYSTEMS:   Review of Systems  Constitutional: Negative.  Negative for fever, malaise/fatigue and weight loss.  HENT:  Negative for congestion and sore throat.   Respiratory: Negative.  Negative for cough and shortness of breath.   Cardiovascular: Negative.  Negative for chest pain, palpitations and leg swelling.  Gastrointestinal: Negative.  Negative for abdominal pain, blood in stool, constipation, diarrhea, melena, nausea and vomiting.  Genitourinary: Negative.  Negative for dysuria.  Musculoskeletal: Negative.  Negative for back pain.  Skin: Negative.  Negative for rash.  Neurological:  Positive for tingling and sensory change. Negative for focal weakness and weakness.  Psychiatric/Behavioral: Negative.  The  patient is not nervous/anxious and does not have insomnia.     As per HPI. Otherwise, a complete review of systems is negative.  PAST MEDICAL HISTORY: Past Medical History:  Diagnosis Date   Actinic keratosis    Arthritis    hands   Benign neoplasm of ascending colon    Cataract    GERD (gastroesophageal reflux disease)    Hyperlipemia    Hypertension 01/31/2015   Hypothyroidism    Melanoma (HCC) 04/28/2018   R mid to distal ant lat thigh. MM, SS, tumor thickness 0.74mm, antatomic level II. Excised: 05/13/18, margins free   Multiple myeloma (HCC) 04/18/2016   Multiple myeloma in remission (HCC) 04/18/2016   Osteoporosis    SCC (squamous cell carcinoma) 06/17/2023   R volar forearm, [facility]   Shingles    Skin cancer    basal cell   Squamous cell carcinoma in situ (SCCIS) 02/06/2024   right medial knee, [facility]   Squamous cell carcinoma of skin 02/04/2014   Left pretibial. KA-like pattern   Squamous cell carcinoma of skin 09/15/2015   Right lat. superior ankle are. Superficial infiltration. Tx: [facility]   Squamous cell carcinoma of skin 10/15/2016   Left medial mid lower leg. Tx: [facility]   Squamous cell carcinoma of skin 11/27/2018   Right dorsum proximal forearm.   Squamous cell carcinoma of skin 09/09/2019   Right cheek lat. to mid nasolabial area. Tx: [facility]   Squamous cell carcinoma of skin 12/09/2019   right pretibial below knee   Squamous cell carcinoma of skin 06/25/2022   Left ant forearm - [facility]    PAST SURGICAL HISTORY: Past Surgical History:  Procedure Laterality Date   BREAST EXCISIONAL BIOPSY Right 15+ yrs ago  EXCISIONAL - NEG   CATARACT EXTRACTION W/ INTRAOCULAR LENS IMPLANT     CHOLECYSTECTOMY N/A 03/14/2017   Procedure: LAPAROSCOPIC CHOLECYSTECTOMY;  Surgeon: Wonda Charlie BRAVO, MD;  Location: [facility];  Service: General;  Laterality: N/A;   COLONOSCOPY WITH PROPOFOL  N/A 02/21/2015   Procedure: COLONOSCOPY WITH PROPOFOL ;  Surgeon: Rogelia Copping, MD;  Location: [facility];  Service: Endoscopy;  Laterality: N/A;   IR BONE MARROW BIOPSY & ASPIRATION  04/22/2024   LAPAROSCOPIC HYSTERECTOMY  1980   POLYPECTOMY  02/21/2015   Procedure: POLYPECTOMY;  Surgeon: Rogelia Copping, MD;  Location: [facility];  Service: Endoscopy;;    FAMILY HISTORY: Family History  Problem Relation Age of Onset   Heart disease Mother    Stroke Father    Heart disease Brother    Kidney cancer Neg Hx    Prostate cancer Neg Hx    Bladder Cancer Neg Hx    Breast cancer Neg Hx     ADVANCED DIRECTIVES (Y/N):  N  HEALTH MAINTENANCE: Social History   Tobacco Use   Smoking status: Never   Smokeless tobacco: Never  Vaping Use   Vaping status: Never Used  Substance Use Topics   Alcohol use: No    Alcohol/week: 0.0 standard drinks of alcohol   Drug use: No     Colonoscopy:  PAP:  Bone density:  Lipid panel:  Allergies  Allergen Reactions   Revlimid  [Lenalidomide ]     Current Outpatient Medications  Medication Sig Dispense Refill   aspirin EC 81 MG tablet Take 81 mg by mouth daily.     dexamethasone  (DECADRON ) 4 MG tablet SMARTSIG:0 Tablet(s) By Mouth As Directed     fexofenadine (ALLEGRA) 180 MG tablet Take 180 mg by mouth daily.     levothyroxine  (SYNTHROID ) 50 MCG tablet TAKE 1 TABLET BY MOUTH DAILY BEFORE BREAKFAST 90 tablet 3   Magnesium 400 MG TABS as directed Orally     montelukast  (SINGULAIR ) 10 MG tablet Take 1 tablet (10 mg total) by mouth once. Prior to chemotherapy 15 tablet 1   Multiple Vitamins-Minerals (CENTRUM SILVER PO) Take 1 tablet by mouth daily.     triamcinolone  (NASACORT ) 55 MCG/ACT AERO nasal inhaler Place 2 sprays into the nose 2 (two) times daily as needed.     No current facility-administered medications for this visit.    OBJECTIVE: Vitals:   07/09/24 1400  BP: 132/65  Pulse: 72  Resp: 20  Temp: 97.6 F (36.4 C)  SpO2: 100%     Body mass index is 22.43 kg/m.    ECOG FS:0 - Asymptomatic  General: Well-developed,  well-nourished, no acute distress. Eyes: Pink conjunctiva, anicteric sclera. HEENT: Normocephalic, moist mucous membranes. Lungs: No audible wheezing or coughing. Heart: Regular rate and rhythm. Abdomen: Soft, nontender, no obvious distention. Musculoskeletal: No edema, cyanosis, or clubbing. Neuro: Alert, answering all questions appropriately. Cranial nerves grossly intact. Skin: No rashes or petechiae noted. Psych: Normal affect.  LAB RESULTS:  Lab Results  Component Value Date   NA 137 07/09/2024   K 4.3 07/09/2024   CL 103 07/09/2024   CO2 23 07/09/2024   GLUCOSE 128 (H) 07/09/2024   BUN 18 07/09/2024   CREATININE 0.88 07/09/2024   CALCIUM 8.9 07/09/2024   PROT 6.2 (L) 07/09/2024   ALBUMIN 4.2 07/09/2024   AST 21 07/09/2024   ALT 18 07/09/2024   ALKPHOS 95 07/09/2024   BILITOT 0.3 07/09/2024   GFRNONAA >60 07/09/2024   GFRAA 40 (L) 03/01/2020  Lab Results  Component Value Date   WBC 3.1 (L) 07/09/2024   NEUTROABS 1.8 07/09/2024   HGB 13.4 07/09/2024   HCT 38.9 07/09/2024   MCV 94.9 07/09/2024   PLT 125 (L) 07/09/2024   Lab Results  Component Value Date   TOTALPROTELP 6.5 04/13/2024   ALBUMINELP 3.5 05/16/2021   A1GS 0.2 05/16/2021   A2GS 0.6 05/16/2021   BETS 0.8 05/16/2021   GAMS 0.5 05/16/2021   MSPIKE Not Observed 05/16/2021   SPEI Comment 05/16/2021     STUDIES: No results found.    ASSESSMENT:  Multiple myeloma in relapse.  PLAN:    Multiple myeloma in relapse: Bone marrow biopsy on April 09, 2016 revealed 44% plasma cells and normal cytogenetics. Given the results of her metastatic bone survey on March 12, 2016, her elevated lambda free chains, and hypercalcemia, patient fit the criteria for multiple myeloma and was initially treated with single agent Revlimid .  She could not tolerate Revlimid  and was switched to pomalidomide  2 mg daily for 21 days with a 7-day break.  Repeat bone marrow biopsy on April 22, 2024 revealed persistent  disease with 5% plasma cells.  M spike and immunoglobulins continue to remain within normal limits.  Her lambda free light chains continued to trend up and her most recent result is 958.7 indicating progressive disease.  Patient has discontinued Pomalyst .  Proceed with cycle 1, day 15 of daratumumab .  Continue Zometa  every 3 months.  Return to clinic in 1 week for further evaluation and consideration of cycle 2, day 1.   Premedications: Patient will take these at home (650 mg Tylenol , 25 mg Benadryl , 10 mg dexamethasone ) and have been discontinued from her treatment plan. Insomnia: Improved.  Dexamethasone  was decreased to 10 mg.  Peripheral neuropathy: Chronic and unchanged.  Patient was previously given a referral to acupuncture which she said did not help much.  Diarrhea: Patient does not complain of this today.  Patient reports GI has elected not to repeat colonoscopy. Leukopenia: Chronic and unchanged.  Patient's total white blood cell count is 3.1.   Thrombocytopenia: Platelet count mildly decreased to 125.  Continue treatment as planned.   Patient expressed understanding and was in agreement with this plan. She also understands that She can call clinic at any time with any questions, concerns, or complaints.    Evalene JINNY Reusing, MD 07/09/24 3:42 PM

## 2024-07-12 ENCOUNTER — Other Ambulatory Visit: Payer: Self-pay

## 2024-07-14 ENCOUNTER — Inpatient Hospital Stay: Admitting: Oncology

## 2024-07-14 ENCOUNTER — Inpatient Hospital Stay

## 2024-07-16 ENCOUNTER — Inpatient Hospital Stay

## 2024-07-16 ENCOUNTER — Inpatient Hospital Stay: Admitting: Oncology

## 2024-07-16 ENCOUNTER — Inpatient Hospital Stay: Attending: Oncology

## 2024-07-16 ENCOUNTER — Encounter: Payer: Self-pay | Admitting: Oncology

## 2024-07-16 VITALS — BP 138/66 | HR 67 | Temp 97.2°F | Resp 18 | Ht 61.0 in | Wt 119.9 lb

## 2024-07-16 DIAGNOSIS — C9000 Multiple myeloma not having achieved remission: Secondary | ICD-10-CM

## 2024-07-16 LAB — CBC WITH DIFFERENTIAL (CANCER CENTER ONLY)
Abs Immature Granulocytes: 0.01 10*3/uL (ref 0.00–0.07)
Basophils Absolute: 0 10*3/uL (ref 0.0–0.1)
Basophils Relative: 0 %
Eosinophils Absolute: 0 10*3/uL (ref 0.0–0.5)
Eosinophils Relative: 0 %
HCT: 38.6 % (ref 36.0–46.0)
Hemoglobin: 13.2 g/dL (ref 12.0–15.0)
Immature Granulocytes: 0 %
Lymphocytes Relative: 20 %
Lymphs Abs: 0.7 10*3/uL (ref 0.7–4.0)
MCH: 32.8 pg (ref 26.0–34.0)
MCHC: 34.2 g/dL (ref 30.0–36.0)
MCV: 95.8 fL (ref 80.0–100.0)
Monocytes Absolute: 0.6 10*3/uL (ref 0.1–1.0)
Monocytes Relative: 18 %
Neutro Abs: 2 10*3/uL (ref 1.7–7.7)
Neutrophils Relative %: 62 %
Platelet Count: 125 10*3/uL — ABNORMAL LOW (ref 150–400)
RBC: 4.03 MIL/uL (ref 3.87–5.11)
RDW: 12.6 % (ref 11.5–15.5)
WBC Count: 3.2 10*3/uL — ABNORMAL LOW (ref 4.0–10.5)
nRBC: 0 % (ref 0.0–0.2)

## 2024-07-16 LAB — CMP (CANCER CENTER ONLY)
ALT: 17 U/L (ref 0–44)
AST: 21 U/L (ref 15–41)
Albumin: 4.2 g/dL (ref 3.5–5.0)
Alkaline Phosphatase: 102 U/L (ref 38–126)
Anion gap: 10 (ref 5–15)
BUN: 16 mg/dL (ref 8–23)
CO2: 25 mmol/L (ref 22–32)
Calcium: 9 mg/dL (ref 8.9–10.3)
Chloride: 103 mmol/L (ref 98–111)
Creatinine: 0.74 mg/dL (ref 0.44–1.00)
GFR, Estimated: >60 mL/min
Glucose, Bld: 97 mg/dL (ref 70–99)
Potassium: 4.2 mmol/L (ref 3.5–5.1)
Sodium: 138 mmol/L (ref 135–145)
Total Bilirubin: 0.4 mg/dL (ref 0.0–1.2)
Total Protein: 6.1 g/dL — ABNORMAL LOW (ref 6.5–8.1)

## 2024-07-16 MED ORDER — ZOLEDRONIC ACID 4 MG/5ML IV CONC
3.0000 mg | Freq: Once | INTRAVENOUS | Status: AC
  Administered 2024-07-16: 3 mg via INTRAVENOUS
  Filled 2024-07-16: qty 3.75

## 2024-07-16 MED ORDER — DARATUMUMAB-HYALURONIDASE-FIHJ 1800-30000 MG-UT/15ML ~~LOC~~ SOLN
1800.0000 mg | Freq: Once | SUBCUTANEOUS | Status: AC
  Administered 2024-07-16: 1800 mg via SUBCUTANEOUS
  Filled 2024-07-16: qty 15

## 2024-07-16 NOTE — Patient Instructions (Addendum)
 [facility] - A DEPT OF [facility]  Discharge Instructions: Thank you for choosing [facility] to provide your oncology and hematology care.  If you have a lab appointment with the Cancer Center, please go directly to the Cancer Center and check in at the registration area.  Wear comfortable clothing and clothing appropriate for easy access to any Portacath or PICC line.   We strive to give you quality time with your provider. You may need to reschedule your appointment if you arrive late (15 or more minutes).  Arriving late affects you and other patients whose appointments are after yours.  Also, if you miss three or more appointments without notifying the office, you may be dismissed from the clinic at the providers discretion.      For prescription refill requests, have your pharmacy contact our office and allow 72 hours for refills to be completed.    Today you received the following chemotherapy and/or immunotherapy agents DARZALEX    and ZOMETA    To help prevent nausea and vomiting after your treatment, we encourage you to take your nausea medication as directed.  BELOW ARE SYMPTOMS THAT SHOULD BE REPORTED IMMEDIATELY: *FEVER GREATER THAN 100.4 F (38 C) OR HIGHER *CHILLS OR SWEATING *NAUSEA AND VOMITING THAT IS NOT CONTROLLED WITH YOUR NAUSEA MEDICATION *UNUSUAL SHORTNESS OF BREATH *UNUSUAL BRUISING OR BLEEDING *URINARY PROBLEMS (pain or burning when urinating, or frequent urination) *BOWEL PROBLEMS (unusual diarrhea, constipation, pain near the anus) TENDERNESS IN MOUTH AND THROAT WITH OR WITHOUT PRESENCE OF ULCERS (sore throat, sores in mouth, or a toothache) UNUSUAL RASH, SWELLING OR PAIN  UNUSUAL VAGINAL DISCHARGE OR ITCHING   Items with * indicate a potential emergency and should be followed up as soon as possible or go to the Emergency Department if any problems should occur.  Please show the CHEMOTHERAPY ALERT CARD or  IMMUNOTHERAPY ALERT CARD at check-in to the Emergency Department and triage nurse.  Should you have questions after your visit or need to cancel or reschedule your appointment, please contact [facility] - A DEPT OF [facility]  [phone] and follow the prompts.  Office hours are 8:00 a.m. to 4:30 p.m. Monday - Friday. Please note that voicemails left after 4:00 p.m. may not be returned until the following business day.  We are closed weekends and major holidays. You have access to a nurse at all times for urgent questions. Please call the main number to the clinic [phone] and follow the prompts.  For any non-urgent questions, you may also contact your provider using MyChart. We now offer e-Visits for anyone 37 and older to request care online for non-urgent symptoms. For details visit [email or web address].   Also download the MyChart app! Go to the app store, search MyChart, open the app, select [place], and log in with your MyChart username and password.   Daratumumab  Injection What is this medication? DARATUMUMAB  (dar a toom ue mab) treats multiple myeloma, a type of bone marrow cancer. It works by helping your immune system slow or stop the spread of cancer cells. It is a monoclonal antibody. This medicine may be used for other purposes; ask your health care provider or pharmacist if you have questions. COMMON BRAND NAME(S): DARZALEX  What should I tell my care team before I take this medication? They need to know if you have any of these conditions: Hereditary fructose intolerance Infection, such as chickenpox, herpes, hepatitis  B Lung or breathing disease, such as asthma, COPD An unusual or allergic reaction to daratumumab , sorbitol, other medications, foods, dyes, or preservatives Pregnant or trying to get pregnant Breastfeeding How should I use this medication? This medication is injected into a vein. It is given by your care team in a  hospital or clinic setting. Talk to your care team about the use of this medication in children. Special care may be needed. Overdosage: If you think you have taken too much of this medicine contact a poison control center or emergency room at once. NOTE: This medicine is only for you. Do not share this medicine with others. What if I miss a dose? Keep appointments for follow-up doses. It is important not to miss your dose. Call your care team if you are unable to keep an appointment. What may interact with this medication? Interactions have not been studied. This list may not describe all possible interactions. Give your health care provider a list of all the medicines, herbs, non-prescription drugs, or dietary supplements you use. Also tell them if you smoke, drink alcohol, or use illegal drugs. Some items may interact with your medicine. What should I watch for while using this medication? Your condition will be monitored carefully while you are receiving this medication. This medication can cause serious allergic reactions. To reduce your risk, your care team may give you other medication to take before receiving this one. Be sure to follow the directions from your care team. This medication can affect the results of blood tests to match your blood type. These changes can last for up to 6 months after the final dose. Your care team will do blood tests to match your blood type before you start treatment. Tell all of your care team that you are being treated with this medication before receiving a blood transfusion. This medication can affect the results of some tests used to determine treatment response; extra tests may be needed to evaluate response. Talk to your care team if you wish to become pregnant or think you are pregnant. This medication can cause serious birth defects if taken during pregnancy and for 3 months after the last dose. A reliable form of contraception is recommended while  taking this medication and for 3 months after the last dose. Talk to your care team about effective forms of contraception. Do not breast-feed while taking this medication. What side effects may I notice from receiving this medication? Side effects that you should report to your care team as soon as possible: Allergic reactions--skin rash, itching, hives, swelling of the face, lips, tongue, or throat Infection--fever, chills, cough, sore throat, wounds that don't heal, pain or trouble when passing urine, general feeling of discomfort or being unwell Infusion reactions--chest pain, shortness of breath or trouble breathing, feeling faint or lightheaded Unusual bruising or bleeding Side effects that usually do not require medical attention (report to your care team if they continue or are bothersome): Constipation Diarrhea Fatigue Nausea Pain, tingling, or numbness in the hands or feet Swelling of the ankles, hands, or feet This list may not describe all possible side effects. Call your doctor for medical advice about side effects. You may report side effects to FDA at 1-800-FDA-1088. Where should I keep my medication? This medication is given in a hospital or clinic. It will not be stored at home. NOTE: This sheet is a summary. It may not cover all possible information. If you have questions about this medicine, talk to your doctor,  pharmacist, or health care provider.  2024 Elsevier/Gold Standard (2022-04-05 00:00:00)  Zoledronic  Acid Injection (Cancer) What is this medication? ZOLEDRONIC  ACID (ZOE le dron ik AS id) treats high calcium levels in the blood caused by cancer. It may also be used with chemotherapy to treat weakened bones caused by cancer. It works by slowing down the release of calcium from bones. This lowers calcium levels in your blood. It also makes your bones stronger and less likely to break (fracture). It belongs to a group of medications called bisphosphonates. This  medicine may be used for other purposes; ask your health care provider or pharmacist if you have questions. COMMON BRAND NAME(S): Zometa , Zometa  Powder What should I tell my care team before I take this medication? They need to know if you have any of these conditions: Dehydration Dental disease Kidney disease Liver disease Low levels of calcium in the blood Lung or breathing disease, such as asthma Receiving steroids, such as dexamethasone  or prednisone  An unusual or allergic reaction to zoledronic  acid, other medications, foods, dyes, or preservatives Pregnant or trying to get pregnant Breast-feeding How should I use this medication? This medication is injected into a vein. It is given by your care team in a hospital or clinic setting. Talk to your care team about the use of this medication in children. Special care may be needed. Overdosage: If you think you have taken too much of this medicine contact a poison control center or emergency room at once. NOTE: This medicine is only for you. Do not share this medicine with others. What if I miss a dose? Keep appointments for follow-up doses. It is important not to miss your dose. Call your care team if you are unable to keep an appointment. What may interact with this medication? Certain antibiotics given by injection Diuretics, such as bumetanide, furosemide NSAIDs, medications for pain and inflammation, such as ibuprofen or naproxen Teriparatide Thalidomide This list may not describe all possible interactions. Give your health care provider a list of all the medicines, herbs, non-prescription drugs, or dietary supplements you use. Also tell them if you smoke, drink alcohol, or use illegal drugs. Some items may interact with your medicine. What should I watch for while using this medication? Visit your care team for regular checks on your progress. It may be some time before you see the benefit from this medication. Some people who  take this medication have severe bone, joint, or muscle pain. This medication may also increase your risk for jaw problems or a broken thigh bone. Tell your care team right away if you have severe pain in your jaw, bones, joints, or muscles. Tell you care team if you have any pain that does not go away or that gets worse. Tell your dentist and dental surgeon that you are taking this medication. You should not have major dental surgery while on this medication. See your dentist to have a dental exam and fix any dental problems before starting this medication. Take good care of your teeth while on this medication. Make sure you see your dentist for regular follow-up appointments. You should make sure you get enough calcium and vitamin D while you are taking this medication. Discuss the foods you eat and the vitamins you take with your care team. Check with your care team if you have severe diarrhea, nausea, and vomiting, or if you sweat a lot. The loss of too much body fluid may make it dangerous for you to take this medication. You  may need bloodwork while taking this medication. Talk to your care team if you wish to become pregnant or think you might be pregnant. This medication can cause serious birth defects. What side effects may I notice from receiving this medication? Side effects that you should report to your care team as soon as possible: Allergic reactions--skin rash, itching, hives, swelling of the face, lips, tongue, or throat Kidney injury--decrease in the amount of urine, swelling of the ankles, hands, or feet Low calcium level--muscle pain or cramps, confusion, tingling, or numbness in the hands or feet Osteonecrosis of the jaw--pain, swelling, or redness in the mouth, numbness of the jaw, poor healing after dental work, unusual discharge from the mouth, visible bones in the mouth Severe bone, joint, or muscle pain Side effects that usually do not require medical attention (report to your  care team if they continue or are bothersome): Constipation Fatigue Fever Loss of appetite Nausea Stomach pain This list may not describe all possible side effects. Call your doctor for medical advice about side effects. You may report side effects to FDA at 1-800-FDA-1088. Where should I keep my medication? This medication is given in a hospital or clinic. It will not be stored at home. NOTE: This sheet is a summary. It may not cover all possible information. If you have questions about this medicine, talk to your doctor, pharmacist, or health care provider.  2024 Elsevier/Gold Standard (2021-07-21 00:00:00)

## 2024-07-16 NOTE — Progress Notes (Signed)
 [facility]  [phone] [phone]  ID: Rhonda [name] OB: Jul 05, 1933  MR#: [id number]  RDW#:[id number]  Patient Care Team: Liana Fish, NP as PCP - General (Nurse Practitioner) Darliss Rogue, MD as PCP - Cardiology (Cardiology) Jacobo Rhonda PARAS, MD as Consulting Physician (Oncology)   CHIEF COMPLAINT:  Multiple myeloma in relapse.  INTERVAL HISTORY: Patient returns to clinic today for further evaluation and consideration of cycle 2, day 1 of single agent daratumumab .  She continues to tolerate her treatment well without significant side effects. She does not complain of weakness or fatigue today.  She currently feels well and is asymptomatic. She has a chronic peripheral neuropathy, but no other neurologic complaints.  She denies any recent fevers or illnesses.  She has a good appetite and denies weight loss.  She denies any chest pain, shortness of breath, cough, or hemoptysis.  She denies any nausea, vomiting, constipation, or diarrhea.  She has no melena or hematochezia.  She has no urinary complaints.  Patient offers no further specific complaints today.  REVIEW OF SYSTEMS:   Review of Systems  Constitutional: Negative.  Negative for fever, malaise/fatigue and weight loss.  HENT:  Negative for congestion and sore throat.   Respiratory: Negative.  Negative for cough and shortness of breath.   Cardiovascular: Negative.  Negative for chest pain, palpitations and leg swelling.  Gastrointestinal: Negative.  Negative for abdominal pain, blood in stool, constipation, diarrhea, melena, nausea and vomiting.  Genitourinary: Negative.  Negative for dysuria.  Musculoskeletal: Negative.  Negative for back pain.  Skin: Negative.  Negative for rash.  Neurological:  Positive for tingling and sensory change. Negative for focal weakness and weakness.  Psychiatric/Behavioral: Negative.  The patient is not nervous/anxious and does not have insomnia.      As per HPI. Otherwise, a complete review of systems is negative.  PAST MEDICAL HISTORY: Past Medical History:  Diagnosis Date   Actinic keratosis    Arthritis    hands   Benign neoplasm of ascending colon    Cataract    GERD (gastroesophageal reflux disease)    Hyperlipemia    Hypertension 01/31/2015   Hypothyroidism    Melanoma (HCC) 04/28/2018   R mid to distal ant lat thigh. MM, SS, tumor thickness 0.46mm, antatomic level II. Excised: 05/13/18, margins free   Multiple myeloma (HCC) 04/18/2016   Multiple myeloma in remission (HCC) 04/18/2016   Osteoporosis    SCC (squamous cell carcinoma) 06/17/2023   R volar forearm, [facility]   Shingles    Skin cancer    basal cell   Squamous cell carcinoma in situ (SCCIS) 02/06/2024   right medial knee, [facility]   Squamous cell carcinoma of skin 02/04/2014   Left pretibial. KA-like pattern   Squamous cell carcinoma of skin 09/15/2015   Right lat. superior ankle are. Superficial infiltration. Tx: [facility]   Squamous cell carcinoma of skin 10/15/2016   Left medial mid lower leg. Tx: [facility]   Squamous cell carcinoma of skin 11/27/2018   Right dorsum proximal forearm.   Squamous cell carcinoma of skin 09/09/2019   Right cheek lat. to mid nasolabial area. Tx: [facility]   Squamous cell carcinoma of skin 12/09/2019   right pretibial below knee   Squamous cell carcinoma of skin 06/25/2022   Left ant forearm - [facility]    PAST SURGICAL HISTORY: Past Surgical History:  Procedure Laterality Date   BREAST EXCISIONAL BIOPSY Right 15+ yrs ago   EXCISIONAL - NEG   CATARACT EXTRACTION  W/ INTRAOCULAR LENS IMPLANT     CHOLECYSTECTOMY N/A 03/14/2017   Procedure: LAPAROSCOPIC CHOLECYSTECTOMY;  Surgeon: Wonda Charlie BRAVO, MD;  Location: [facility];  Service: General;  Laterality: N/A;   COLONOSCOPY WITH PROPOFOL  N/A 02/21/2015   Procedure: COLONOSCOPY WITH PROPOFOL ;  Surgeon: Rogelia Copping, MD;  Location: [facility];  Service: Endoscopy;  Laterality: N/A;   IR BONE  MARROW BIOPSY & ASPIRATION  04/22/2024   LAPAROSCOPIC HYSTERECTOMY  1980   POLYPECTOMY  02/21/2015   Procedure: POLYPECTOMY;  Surgeon: Rogelia Copping, MD;  Location: [facility];  Service: Endoscopy;;    FAMILY HISTORY: Family History  Problem Relation Age of Onset   Heart disease Mother    Stroke Father    Heart disease Brother    Kidney cancer Neg Hx    Prostate cancer Neg Hx    Bladder Cancer Neg Hx    Breast cancer Neg Hx     ADVANCED DIRECTIVES (Y/N):  N  HEALTH MAINTENANCE: Social History   Tobacco Use   Smoking status: Never   Smokeless tobacco: Never  Vaping Use   Vaping status: Never Used  Substance Use Topics   Alcohol use: No    Alcohol/week: 0.0 standard drinks of alcohol   Drug use: No     Colonoscopy:  PAP:  Bone density:  Lipid panel:  Allergies  Allergen Reactions   Revlimid  [Lenalidomide ]     Current Outpatient Medications  Medication Sig Dispense Refill   aspirin EC 81 MG tablet Take 81 mg by mouth daily.     dexamethasone  (DECADRON ) 4 MG tablet SMARTSIG:0 Tablet(s) By Mouth As Directed     fexofenadine (ALLEGRA) 180 MG tablet Take 180 mg by mouth daily.     levothyroxine  (SYNTHROID ) 50 MCG tablet TAKE 1 TABLET BY MOUTH DAILY BEFORE BREAKFAST 90 tablet 3   Magnesium 400 MG TABS as directed Orally     montelukast  (SINGULAIR ) 10 MG tablet Take 1 tablet (10 mg total) by mouth once. Prior to chemotherapy 15 tablet 1   Multiple Vitamins-Minerals (CENTRUM SILVER PO) Take 1 tablet by mouth daily.     triamcinolone  (NASACORT ) 55 MCG/ACT AERO nasal inhaler Place 2 sprays into the nose 2 (two) times daily as needed.     No current facility-administered medications for this visit.    OBJECTIVE: Vitals:   07/16/24 1415  BP: 138/66  Pulse: 67  Resp: 18  Temp: (!) 97.2 F (36.2 C)  SpO2: 99%     Body mass index is 22.65 kg/m.    ECOG FS:0 - Asymptomatic  General: Well-developed, well-nourished, no acute distress. Eyes: Pink conjunctiva,  anicteric sclera. HEENT: Normocephalic, moist mucous membranes. Lungs: No audible wheezing or coughing. Heart: Regular rate and rhythm. Abdomen: Soft, nontender, no obvious distention. Musculoskeletal: No edema, cyanosis, or clubbing. Neuro: Alert, answering all questions appropriately. Cranial nerves grossly intact. Skin: No rashes or petechiae noted. Psych: Normal affect.  LAB RESULTS:  Lab Results  Component Value Date   NA 138 07/16/2024   K 4.2 07/16/2024   CL 103 07/16/2024   CO2 25 07/16/2024   GLUCOSE 97 07/16/2024   BUN 16 07/16/2024   CREATININE 0.74 07/16/2024   CALCIUM 9.0 07/16/2024   PROT 6.1 (L) 07/16/2024   ALBUMIN 4.2 07/16/2024   AST 21 07/16/2024   ALT 17 07/16/2024   ALKPHOS 102 07/16/2024   BILITOT 0.4 07/16/2024   GFRNONAA >60 07/16/2024   GFRAA 40 (L) 03/01/2020    Lab Results  Component Value Date  WBC 3.2 (L) 07/16/2024   NEUTROABS 2.0 07/16/2024   HGB 13.2 07/16/2024   HCT 38.6 07/16/2024   MCV 95.8 07/16/2024   PLT 125 (L) 07/16/2024   Lab Results  Component Value Date   TOTALPROTELP 6.5 04/13/2024   ALBUMINELP 3.5 05/16/2021   A1GS 0.2 05/16/2021   A2GS 0.6 05/16/2021   BETS 0.8 05/16/2021   GAMS 0.5 05/16/2021   MSPIKE Not Observed 05/16/2021   SPEI Comment 05/16/2021     STUDIES: No results found.    ASSESSMENT:  Multiple myeloma in relapse.  PLAN:    Multiple myeloma in relapse: Bone marrow biopsy on April 09, 2016 revealed 44% plasma cells and normal cytogenetics. Given the results of her metastatic bone survey on March 12, 2016, her elevated lambda free chains, and hypercalcemia, patient fit the criteria for multiple myeloma and was initially treated with single agent Revlimid .  She could not tolerate Revlimid  and was switched to pomalidomide  2 mg daily for 21 days with a 7-day break.  Repeat bone marrow biopsy on April 22, 2024 revealed persistent disease with 5% plasma cells.  M spike and immunoglobulins continue  to remain within normal limits.  Her lambda free light chains continued to trend up and her most recent result is 958.7 indicating progressive disease.  Today's result is pending.  Patient has discontinued Pomalyst .  Proceed with cycle 2, day 1 of daratumumab .  Patient will also receive Zometa  today.  Return to clinic in 1 week for further evaluation and consideration of cycle 2, day 8. Premedications: Patient will take these at home (650 mg Tylenol , 25 mg Benadryl , 10 mg dexamethasone ) and have been discontinued from her treatment plan. Insomnia: Improved.  Dexamethasone  was decreased to 10 mg.  Peripheral neuropathy: Chronic and unchanged.  Patient was previously given a referral to acupuncture which she said did not help much.  Diarrhea: Patient does not complain of this today.  Patient reports GI has elected not to repeat colonoscopy. Leukopenia: Chronic and unchanged.  Patient's total white blood cell count is 3.2 today.   Thrombocytopenia: Chronic and unchanged.  Patient's platelet count is 125.  Proceed with treatment as above.  I spent a total of 30 minutes reviewing chart data, face-to-face evaluation with the patient, counseling and coordination of care as detailed above.  Patient expressed understanding and was in agreement with this plan. She also understands that She can call clinic at any time with any questions, concerns, or complaints.    Rhonda JINNY Reusing, MD 07/16/24 3:48 PM

## 2024-07-17 LAB — KAPPA/LAMBDA LIGHT CHAINS
Kappa free light chain: 7.4 mg/L (ref 3.3–19.4)
Kappa, lambda light chain ratio: 0.02 — ABNORMAL LOW (ref 0.26–1.65)
Lambda free light chains: 429.1 mg/L — ABNORMAL HIGH (ref 5.7–26.3)

## 2024-07-21 ENCOUNTER — Inpatient Hospital Stay: Admitting: Oncology

## 2024-07-21 ENCOUNTER — Inpatient Hospital Stay

## 2024-07-23 ENCOUNTER — Inpatient Hospital Stay

## 2024-07-23 ENCOUNTER — Inpatient Hospital Stay: Admitting: Oncology

## 2024-07-28 ENCOUNTER — Inpatient Hospital Stay

## 2024-07-28 ENCOUNTER — Inpatient Hospital Stay: Admitting: Oncology

## 2024-07-30 ENCOUNTER — Inpatient Hospital Stay

## 2024-07-30 ENCOUNTER — Inpatient Hospital Stay: Admitting: Oncology

## 2024-08-04 ENCOUNTER — Inpatient Hospital Stay

## 2024-08-04 ENCOUNTER — Inpatient Hospital Stay: Admitting: Oncology

## 2024-08-06 ENCOUNTER — Inpatient Hospital Stay

## 2024-08-06 ENCOUNTER — Inpatient Hospital Stay: Admitting: Oncology

## 2024-08-11 ENCOUNTER — Inpatient Hospital Stay

## 2024-08-11 ENCOUNTER — Inpatient Hospital Stay: Admitting: Oncology

## 2024-08-13 ENCOUNTER — Inpatient Hospital Stay

## 2024-08-13 ENCOUNTER — Inpatient Hospital Stay: Admitting: Oncology

## 2024-08-13 ENCOUNTER — Ambulatory Visit: Admitting: Dermatology

## 2024-08-25 ENCOUNTER — Inpatient Hospital Stay

## 2024-08-25 ENCOUNTER — Inpatient Hospital Stay: Admitting: Oncology

## 2024-08-27 ENCOUNTER — Inpatient Hospital Stay

## 2024-08-27 ENCOUNTER — Inpatient Hospital Stay: Admitting: Oncology

## 2024-09-08 ENCOUNTER — Inpatient Hospital Stay: Admitting: Nurse Practitioner

## 2024-09-08 ENCOUNTER — Inpatient Hospital Stay

## 2024-09-10 ENCOUNTER — Inpatient Hospital Stay: Admitting: Nurse Practitioner

## 2024-09-10 ENCOUNTER — Inpatient Hospital Stay

## 2024-09-22 ENCOUNTER — Inpatient Hospital Stay

## 2024-09-22 ENCOUNTER — Inpatient Hospital Stay: Admitting: Oncology

## 2024-09-24 ENCOUNTER — Inpatient Hospital Stay

## 2024-09-24 ENCOUNTER — Inpatient Hospital Stay: Admitting: Oncology

## 2024-10-08 ENCOUNTER — Inpatient Hospital Stay

## 2024-10-08 ENCOUNTER — Inpatient Hospital Stay: Admitting: Oncology
